# Patient Record
Sex: Female | Born: 1950 | Race: White | Hispanic: No | Marital: Married | Smoking: Never smoker
Health system: Southern US, Community
[De-identification: ages and names within clinical notes are randomized; demographics above are authoritative.]

## PROBLEM LIST (undated history)

## (undated) DIAGNOSIS — Z87442 Personal history of urinary calculi: Secondary | ICD-10-CM

## (undated) DIAGNOSIS — N10 Acute tubulo-interstitial nephritis: Secondary | ICD-10-CM

## (undated) DIAGNOSIS — R0600 Dyspnea, unspecified: Secondary | ICD-10-CM

## (undated) DIAGNOSIS — K5792 Diverticulitis of intestine, part unspecified, without perforation or abscess without bleeding: Secondary | ICD-10-CM

## (undated) DIAGNOSIS — K589 Irritable bowel syndrome without diarrhea: Secondary | ICD-10-CM

## (undated) DIAGNOSIS — E049 Nontoxic goiter, unspecified: Secondary | ICD-10-CM

## (undated) DIAGNOSIS — K219 Gastro-esophageal reflux disease without esophagitis: Secondary | ICD-10-CM

## (undated) DIAGNOSIS — R51 Headache: Secondary | ICD-10-CM

## (undated) DIAGNOSIS — K1379 Other lesions of oral mucosa: Secondary | ICD-10-CM

## (undated) DIAGNOSIS — R011 Cardiac murmur, unspecified: Secondary | ICD-10-CM

## (undated) DIAGNOSIS — Z9289 Personal history of other medical treatment: Secondary | ICD-10-CM

## (undated) DIAGNOSIS — N200 Calculus of kidney: Secondary | ICD-10-CM

## (undated) DIAGNOSIS — R7303 Prediabetes: Secondary | ICD-10-CM

## (undated) HISTORY — DX: Irritable bowel syndrome, unspecified: K58.9

## (undated) HISTORY — DX: Nontoxic goiter, unspecified: E04.9

## (undated) HISTORY — DX: Acute pyelonephritis: N10

## (undated) HISTORY — DX: Dyspnea, unspecified: R06.00

## (undated) HISTORY — DX: Calculus of kidney: N20.0

## (undated) HISTORY — DX: Gastro-esophageal reflux disease without esophagitis: K21.9

## (undated) HISTORY — DX: Headache: R51

## (undated) HISTORY — DX: Cardiac murmur, unspecified: R01.1

## (undated) HISTORY — DX: Personal history of urinary calculi: Z87.442

## (undated) HISTORY — DX: Other lesions of oral mucosa: K13.79

## (undated) HISTORY — PX: OTHER SURGICAL HISTORY: SHX169

## (undated) HISTORY — DX: Diverticulitis of intestine, part unspecified, without perforation or abscess without bleeding: K57.92

## (undated) HISTORY — DX: Personal history of other medical treatment: Z92.89

## (undated) HISTORY — DX: Prediabetes: R73.03

---
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
## 1977-01-26 HISTORY — PX: TONSILLECTOMY: SUR1361

## 1992-01-27 HISTORY — PX: ABDOMINAL HYSTERECTOMY: SHX81

## 2001-01-02 ENCOUNTER — Encounter: Payer: Self-pay | Admitting: Orthopedic Surgery

## 2001-01-02 ENCOUNTER — Emergency Department (HOSPITAL_COMMUNITY): Admission: EM | Admit: 2001-01-02 | Discharge: 2001-01-02 | Payer: Self-pay | Admitting: Orthopedic Surgery

## 2001-12-14 ENCOUNTER — Encounter: Payer: Self-pay | Admitting: Emergency Medicine

## 2001-12-14 ENCOUNTER — Emergency Department (HOSPITAL_COMMUNITY): Admission: EM | Admit: 2001-12-14 | Discharge: 2001-12-14 | Payer: Self-pay | Admitting: Emergency Medicine

## 2003-01-27 HISTORY — PX: TOTAL THYROIDECTOMY: SHX2547

## 2004-05-06 ENCOUNTER — Ambulatory Visit: Payer: Self-pay | Admitting: Internal Medicine

## 2004-09-08 ENCOUNTER — Ambulatory Visit: Payer: Self-pay | Admitting: Internal Medicine

## 2004-09-15 ENCOUNTER — Encounter: Admission: RE | Admit: 2004-09-15 | Discharge: 2004-09-15 | Payer: Self-pay | Admitting: Internal Medicine

## 2004-09-22 ENCOUNTER — Encounter (INDEPENDENT_AMBULATORY_CARE_PROVIDER_SITE_OTHER): Payer: Self-pay | Admitting: *Deleted

## 2004-09-22 ENCOUNTER — Ambulatory Visit (HOSPITAL_COMMUNITY): Admission: RE | Admit: 2004-09-22 | Discharge: 2004-09-22 | Payer: Self-pay | Admitting: Internal Medicine

## 2004-09-26 ENCOUNTER — Ambulatory Visit: Payer: Self-pay | Admitting: Internal Medicine

## 2004-10-24 ENCOUNTER — Encounter (HOSPITAL_COMMUNITY): Admission: RE | Admit: 2004-10-24 | Discharge: 2005-01-22 | Payer: Self-pay | Admitting: Otolaryngology

## 2005-01-30 ENCOUNTER — Ambulatory Visit: Payer: Self-pay | Admitting: Internal Medicine

## 2005-02-16 ENCOUNTER — Ambulatory Visit: Payer: Self-pay | Admitting: Internal Medicine

## 2005-04-10 ENCOUNTER — Ambulatory Visit: Payer: Self-pay | Admitting: Internal Medicine

## 2005-04-20 ENCOUNTER — Ambulatory Visit: Payer: Self-pay | Admitting: Internal Medicine

## 2005-05-04 ENCOUNTER — Ambulatory Visit: Payer: Self-pay

## 2005-05-04 ENCOUNTER — Encounter: Payer: Self-pay | Admitting: Internal Medicine

## 2005-05-11 ENCOUNTER — Ambulatory Visit: Payer: Self-pay | Admitting: Internal Medicine

## 2005-05-19 ENCOUNTER — Ambulatory Visit: Payer: Self-pay | Admitting: Cardiovascular Disease

## 2005-09-23 ENCOUNTER — Ambulatory Visit: Payer: Self-pay | Admitting: Internal Medicine

## 2006-03-19 ENCOUNTER — Inpatient Hospital Stay (HOSPITAL_COMMUNITY): Admission: EM | Admit: 2006-03-19 | Discharge: 2006-03-25 | Payer: Self-pay | Admitting: Emergency Medicine

## 2006-04-05 ENCOUNTER — Encounter: Payer: Self-pay | Admitting: Internal Medicine

## 2006-04-21 ENCOUNTER — Encounter: Admission: RE | Admit: 2006-04-21 | Discharge: 2006-04-21 | Payer: Self-pay | Admitting: Orthopedic Surgery

## 2006-05-20 ENCOUNTER — Encounter: Admission: RE | Admit: 2006-05-20 | Discharge: 2006-05-20 | Payer: Self-pay | Admitting: Orthopedic Surgery

## 2006-06-01 ENCOUNTER — Ambulatory Visit (HOSPITAL_COMMUNITY): Admission: RE | Admit: 2006-06-01 | Discharge: 2006-06-01 | Payer: Self-pay | Admitting: Orthopedic Surgery

## 2006-06-14 ENCOUNTER — Encounter: Payer: Self-pay | Admitting: Internal Medicine

## 2006-07-01 ENCOUNTER — Encounter: Payer: Self-pay | Admitting: Internal Medicine

## 2006-07-05 ENCOUNTER — Ambulatory Visit (HOSPITAL_COMMUNITY): Admission: RE | Admit: 2006-07-05 | Discharge: 2006-07-05 | Payer: Self-pay | Admitting: Orthopedic Surgery

## 2006-08-17 ENCOUNTER — Encounter: Payer: Self-pay | Admitting: Internal Medicine

## 2006-09-15 ENCOUNTER — Encounter: Payer: Self-pay | Admitting: Internal Medicine

## 2006-09-24 ENCOUNTER — Ambulatory Visit: Payer: Self-pay | Admitting: Internal Medicine

## 2006-09-24 LAB — CONVERTED CEMR LAB
ALT: 42 units/L — ABNORMAL HIGH (ref 0–35)
AST: 28 units/L (ref 0–37)
Alkaline Phosphatase: 64 units/L (ref 39–117)
BUN: 15 mg/dL (ref 6–23)
Basophils Absolute: 0 10*3/uL (ref 0.0–0.1)
Bilirubin Urine: NEGATIVE
CO2: 28 meq/L (ref 19–32)
Chloride: 106 meq/L (ref 96–112)
Creatinine, Ser: 0.7 mg/dL (ref 0.4–1.2)
Eosinophils Absolute: 0.1 10*3/uL (ref 0.0–0.6)
GFR calc Af Amer: 111 mL/min
GFR calc non Af Amer: 92 mL/min
HCT: 37.8 % (ref 36.0–46.0)
HDL: 40 mg/dL (ref >39.0)
MCV: 93.3 fL (ref 78.0–100.0)
Monocytes Absolute: 0.4 10*3/uL (ref 0.2–0.7)
Monocytes Relative: 8.1 % (ref 3.0–11.0)
Neutrophils Relative %: 55.4 % (ref 43.0–77.0)
Nitrite: NEGATIVE
Potassium: 3.9 meq/L (ref 3.5–5.1)
Protein, U semiquant: NEGATIVE
RDW: 13.5 % (ref 11.5–14.6)
Specific Gravity, Urine: 1.02
TSH: 0.16 microintl units/mL — ABNORMAL LOW (ref 0.35–5.50)
Total CHOL/HDL Ratio: 6.3
Total Protein: 6.4 g/dL (ref 6.0–8.3)
VLDL: 34 mg/dL (ref 0–40)
WBC Urine, dipstick: NEGATIVE

## 2006-09-30 DIAGNOSIS — K219 Gastro-esophageal reflux disease without esophagitis: Secondary | ICD-10-CM | POA: Insufficient documentation

## 2006-09-30 DIAGNOSIS — J45909 Unspecified asthma, uncomplicated: Secondary | ICD-10-CM | POA: Insufficient documentation

## 2006-10-04 ENCOUNTER — Ambulatory Visit: Payer: Self-pay | Admitting: Internal Medicine

## 2006-10-04 DIAGNOSIS — M899 Disorder of bone, unspecified: Secondary | ICD-10-CM | POA: Insufficient documentation

## 2006-10-04 DIAGNOSIS — R7401 Elevation of levels of liver transaminase levels: Secondary | ICD-10-CM | POA: Insufficient documentation

## 2006-10-04 DIAGNOSIS — M949 Disorder of cartilage, unspecified: Secondary | ICD-10-CM

## 2006-10-04 DIAGNOSIS — R635 Abnormal weight gain: Secondary | ICD-10-CM | POA: Insufficient documentation

## 2006-10-04 DIAGNOSIS — E039 Hypothyroidism, unspecified: Secondary | ICD-10-CM | POA: Insufficient documentation

## 2006-10-04 DIAGNOSIS — G2581 Restless legs syndrome: Secondary | ICD-10-CM | POA: Insufficient documentation

## 2006-10-04 DIAGNOSIS — E785 Hyperlipidemia, unspecified: Secondary | ICD-10-CM | POA: Insufficient documentation

## 2006-10-04 DIAGNOSIS — R740 Nonspecific elevation of levels of transaminase and lactic acid dehydrogenase [LDH]: Secondary | ICD-10-CM

## 2006-10-12 ENCOUNTER — Encounter: Payer: Self-pay | Admitting: Internal Medicine

## 2006-10-15 ENCOUNTER — Encounter: Admission: RE | Admit: 2006-10-15 | Discharge: 2006-10-15 | Payer: Self-pay | Admitting: Orthopedic Surgery

## 2006-10-20 ENCOUNTER — Telehealth: Payer: Self-pay | Admitting: *Deleted

## 2006-11-10 ENCOUNTER — Encounter: Payer: Self-pay | Admitting: Internal Medicine

## 2006-11-17 ENCOUNTER — Ambulatory Visit: Payer: Self-pay | Admitting: Internal Medicine

## 2006-11-17 LAB — CONVERTED CEMR LAB
Albumin: 4 g/dL (ref 3.5–5.2)
Cholesterol: 268 mg/dL (ref 0–200)
Direct LDL: 209.4 mg/dL
Triglycerides: 117 mg/dL (ref 0–149)

## 2006-11-19 ENCOUNTER — Telehealth: Payer: Self-pay | Admitting: *Deleted

## 2006-11-19 LAB — CONVERTED CEMR LAB
Calcium, Total (PTH): 8.8 mg/dL (ref 8.4–10.5)
Vit D, 1,25-Dihydroxy: 26 — ABNORMAL LOW (ref 30–89)

## 2006-11-25 ENCOUNTER — Ambulatory Visit: Payer: Self-pay | Admitting: Internal Medicine

## 2006-11-25 DIAGNOSIS — E559 Vitamin D deficiency, unspecified: Secondary | ICD-10-CM | POA: Insufficient documentation

## 2006-11-25 LAB — CONVERTED CEMR LAB: HDL goal, serum: 40 mg/dL

## 2007-01-17 ENCOUNTER — Ambulatory Visit: Payer: Self-pay | Admitting: Internal Medicine

## 2007-01-17 LAB — CONVERTED CEMR LAB: Vit D, 1,25-Dihydroxy: 62 (ref 30–89)

## 2007-01-20 LAB — CONVERTED CEMR LAB
ALT: 32 units/L (ref 0–35)
Albumin: 3.9 g/dL (ref 3.5–5.2)
Cholesterol: 161 mg/dL (ref 0–200)
Total CHOL/HDL Ratio: 3.7
Total Protein: 6.4 g/dL (ref 6.0–8.3)
Triglycerides: 113 mg/dL (ref 0–149)
VLDL: 23 mg/dL (ref 0–40)

## 2007-01-25 ENCOUNTER — Ambulatory Visit: Payer: Self-pay | Admitting: Internal Medicine

## 2007-06-27 LAB — HM COLONOSCOPY

## 2007-06-30 ENCOUNTER — Telehealth: Payer: Self-pay | Admitting: Internal Medicine

## 2007-07-01 ENCOUNTER — Ambulatory Visit: Payer: Self-pay | Admitting: Family Medicine

## 2007-08-01 ENCOUNTER — Encounter: Payer: Self-pay | Admitting: Internal Medicine

## 2007-08-26 ENCOUNTER — Encounter: Payer: Self-pay | Admitting: Internal Medicine

## 2007-08-29 ENCOUNTER — Encounter: Payer: Self-pay | Admitting: Internal Medicine

## 2007-11-03 ENCOUNTER — Telehealth: Payer: Self-pay | Admitting: Internal Medicine

## 2007-11-04 ENCOUNTER — Ambulatory Visit: Payer: Self-pay | Admitting: Internal Medicine

## 2007-11-04 LAB — CONVERTED CEMR LAB
Blood in Urine, dipstick: NEGATIVE
Ketones, urine, test strip: NEGATIVE
WBC Urine, dipstick: NEGATIVE
pH: 5

## 2007-11-05 ENCOUNTER — Encounter: Payer: Self-pay | Admitting: Internal Medicine

## 2007-11-06 DIAGNOSIS — Z87442 Personal history of urinary calculi: Secondary | ICD-10-CM | POA: Insufficient documentation

## 2007-12-01 ENCOUNTER — Ambulatory Visit: Payer: Self-pay | Admitting: Internal Medicine

## 2007-12-01 LAB — CONVERTED CEMR LAB
AST: 31 units/L (ref 0–37)
Albumin: 3.9 g/dL (ref 3.5–5.2)
BUN: 13 mg/dL (ref 6–23)
Basophils Absolute: 0 10*3/uL (ref 0.0–0.1)
Bilirubin, Direct: 0.2 mg/dL (ref 0.0–0.3)
Blood in Urine, dipstick: NEGATIVE
CO2: 30 meq/L (ref 19–32)
Chloride: 106 meq/L (ref 96–112)
Cholesterol: 147 mg/dL (ref 0–200)
Eosinophils Relative: 1.6 % (ref 0.0–5.0)
GFR calc non Af Amer: 92 mL/min
Glucose, Bld: 109 mg/dL — ABNORMAL HIGH (ref 70–99)
Glucose, Urine, Semiquant: NEGATIVE
Ketones, urine, test strip: NEGATIVE
Lymphocytes Relative: 31 % (ref 12.0–46.0)
Monocytes Absolute: 0.3 10*3/uL (ref 0.1–1.0)
Nitrite: NEGATIVE
Potassium: 4.3 meq/L (ref 3.5–5.1)
Protein, U semiquant: NEGATIVE
Sodium: 141 meq/L (ref 135–145)
Specific Gravity, Urine: 1.02
TSH: 0.13 microintl units/mL — ABNORMAL LOW (ref 0.35–5.50)
Total Protein: 6.3 g/dL (ref 6.0–8.3)
Triglycerides: 100 mg/dL (ref 0–149)
Urobilinogen, UA: 0.2
WBC Urine, dipstick: NEGATIVE
WBC: 4.3 10*3/uL — ABNORMAL LOW (ref 4.5–10.5)
pH: 5.5

## 2007-12-14 ENCOUNTER — Ambulatory Visit: Payer: Self-pay | Admitting: Internal Medicine

## 2007-12-14 DIAGNOSIS — R7309 Other abnormal glucose: Secondary | ICD-10-CM | POA: Insufficient documentation

## 2007-12-14 DIAGNOSIS — R945 Abnormal results of liver function studies: Secondary | ICD-10-CM | POA: Insufficient documentation

## 2007-12-14 LAB — CONVERTED CEMR LAB
Bilirubin Urine: NEGATIVE
Nitrite: NEGATIVE
Protein, U semiquant: NEGATIVE

## 2007-12-15 ENCOUNTER — Encounter: Payer: Self-pay | Admitting: Internal Medicine

## 2007-12-21 ENCOUNTER — Encounter: Admission: RE | Admit: 2007-12-21 | Discharge: 2007-12-21 | Payer: Self-pay | Admitting: Internal Medicine

## 2007-12-26 ENCOUNTER — Telehealth (INDEPENDENT_AMBULATORY_CARE_PROVIDER_SITE_OTHER): Payer: Self-pay | Admitting: *Deleted

## 2007-12-26 DIAGNOSIS — R935 Abnormal findings on diagnostic imaging of other abdominal regions, including retroperitoneum: Secondary | ICD-10-CM | POA: Insufficient documentation

## 2007-12-28 ENCOUNTER — Ambulatory Visit: Payer: Self-pay | Admitting: Internal Medicine

## 2007-12-28 ENCOUNTER — Ambulatory Visit: Payer: Self-pay

## 2007-12-29 LAB — CONVERTED CEMR LAB
Albumin: 3.7 g/dL (ref 3.5–5.2)
Alkaline Phosphatase: 66 units/L (ref 39–117)
Bilirubin, Direct: 0.1 mg/dL (ref 0.0–0.3)
Free T4: 0.9 ng/dL (ref 0.6–1.6)
TSH: 0.15 microintl units/mL — ABNORMAL LOW (ref 0.35–5.50)

## 2008-01-12 ENCOUNTER — Ambulatory Visit: Payer: Self-pay | Admitting: Internal Medicine

## 2008-01-12 DIAGNOSIS — E669 Obesity, unspecified: Secondary | ICD-10-CM | POA: Insufficient documentation

## 2008-01-12 DIAGNOSIS — R0602 Shortness of breath: Secondary | ICD-10-CM | POA: Insufficient documentation

## 2008-01-13 ENCOUNTER — Ambulatory Visit: Payer: Self-pay | Admitting: Internal Medicine

## 2008-01-16 ENCOUNTER — Telehealth: Payer: Self-pay | Admitting: *Deleted

## 2008-01-27 HISTORY — PX: TOTAL KNEE REVISION: SHX996

## 2008-01-30 ENCOUNTER — Ambulatory Visit: Payer: Self-pay | Admitting: Cardiovascular Disease

## 2008-02-08 ENCOUNTER — Encounter: Payer: Self-pay | Admitting: Internal Medicine

## 2008-02-08 ENCOUNTER — Ambulatory Visit: Payer: Self-pay | Admitting: Internal Medicine

## 2008-02-14 ENCOUNTER — Telehealth: Payer: Self-pay | Admitting: Internal Medicine

## 2008-02-21 ENCOUNTER — Ambulatory Visit: Payer: Self-pay

## 2008-02-21 ENCOUNTER — Encounter: Payer: Self-pay | Admitting: Cardiovascular Disease

## 2008-02-29 ENCOUNTER — Encounter: Admission: RE | Admit: 2008-02-29 | Discharge: 2008-02-29 | Payer: Self-pay | Admitting: Internal Medicine

## 2008-02-29 ENCOUNTER — Encounter: Payer: Self-pay | Admitting: Internal Medicine

## 2008-06-26 HISTORY — PX: OTHER SURGICAL HISTORY: SHX169

## 2008-07-06 ENCOUNTER — Encounter: Payer: Self-pay | Admitting: Cardiovascular Disease

## 2008-09-26 ENCOUNTER — Ambulatory Visit: Payer: Self-pay | Admitting: Internal Medicine

## 2008-09-26 DIAGNOSIS — T50995A Adverse effect of other drugs, medicaments and biological substances, initial encounter: Secondary | ICD-10-CM | POA: Insufficient documentation

## 2008-10-24 ENCOUNTER — Telehealth: Payer: Self-pay | Admitting: *Deleted

## 2008-11-13 ENCOUNTER — Ambulatory Visit: Payer: Self-pay | Admitting: Internal Medicine

## 2008-11-13 LAB — CONVERTED CEMR LAB
Bilirubin Urine: NEGATIVE
Blood in Urine, dipstick: NEGATIVE
Calcium: 9 mg/dL (ref 8.4–10.5)
Chloride: 106 meq/L (ref 96–112)
Eosinophils Absolute: 0.1 10*3/uL (ref 0.0–0.7)
Eosinophils Relative: 1.6 % (ref 0.0–5.0)
GFR calc non Af Amer: 78.21 mL/min (ref >60)
Glucose, Bld: 108 mg/dL — ABNORMAL HIGH (ref 70–99)
HCT: 42.2 % (ref 36.0–46.0)
HDL: 43.6 mg/dL (ref >39.00)
LDL Cholesterol: 77 mg/dL (ref 0–99)
MCHC: 33.2 g/dL (ref 30.0–36.0)
Monocytes Relative: 7.9 % (ref 3.0–12.0)
Neutro Abs: 2.5 10*3/uL (ref 1.4–7.7)
Neutrophils Relative %: 55.2 % (ref 43.0–77.0)
Nitrite: NEGATIVE
Platelets: 225 10*3/uL (ref 150.0–400.0)
Potassium: 4.2 meq/L (ref 3.5–5.1)
Protein, U semiquant: NEGATIVE
RBC: 4.38 M/uL (ref 3.87–5.11)
Sodium: 144 meq/L (ref 135–145)
Specific Gravity, Urine: 1.025
TSH: 0.25 microintl units/mL — ABNORMAL LOW (ref 0.35–5.50)
Total CHOL/HDL Ratio: 3
Total Protein: 6.6 g/dL (ref 6.0–8.3)
Triglycerides: 114 mg/dL (ref 0.0–149.0)
WBC Urine, dipstick: NEGATIVE

## 2008-11-21 ENCOUNTER — Ambulatory Visit: Payer: Self-pay | Admitting: Internal Medicine

## 2008-11-22 ENCOUNTER — Encounter: Payer: Self-pay | Admitting: *Deleted

## 2008-11-22 ENCOUNTER — Encounter (INDEPENDENT_AMBULATORY_CARE_PROVIDER_SITE_OTHER): Payer: Self-pay | Admitting: *Deleted

## 2008-11-26 ENCOUNTER — Telehealth: Payer: Self-pay | Admitting: *Deleted

## 2008-11-28 ENCOUNTER — Encounter: Payer: Self-pay | Admitting: *Deleted

## 2008-11-28 ENCOUNTER — Ambulatory Visit: Payer: Self-pay | Admitting: Cardiology

## 2008-12-03 ENCOUNTER — Encounter (INDEPENDENT_AMBULATORY_CARE_PROVIDER_SITE_OTHER): Payer: Self-pay | Admitting: *Deleted

## 2008-12-06 ENCOUNTER — Encounter (INDEPENDENT_AMBULATORY_CARE_PROVIDER_SITE_OTHER): Payer: Self-pay | Admitting: *Deleted

## 2008-12-18 ENCOUNTER — Encounter (INDEPENDENT_AMBULATORY_CARE_PROVIDER_SITE_OTHER): Payer: Self-pay | Admitting: *Deleted

## 2009-01-01 ENCOUNTER — Encounter (INDEPENDENT_AMBULATORY_CARE_PROVIDER_SITE_OTHER): Payer: Self-pay | Admitting: *Deleted

## 2009-01-01 LAB — CBC AND DIFFERENTIAL
HCT: 39 (ref 36–46)
Hemoglobin: 13.5 (ref 12.0–16.0)
WBC: 4.7

## 2009-01-29 ENCOUNTER — Ambulatory Visit: Payer: Self-pay | Admitting: Cardiovascular Disease

## 2009-01-29 DIAGNOSIS — E7800 Pure hypercholesterolemia, unspecified: Secondary | ICD-10-CM | POA: Insufficient documentation

## 2009-01-30 LAB — CONVERTED CEMR LAB
Direct LDL: 194.6 mg/dL
Triglycerides: 153 mg/dL — ABNORMAL HIGH (ref 0.0–149.0)
VLDL: 30.6 mg/dL (ref 0.0–40.0)

## 2009-03-12 ENCOUNTER — Encounter: Payer: Self-pay | Admitting: Internal Medicine

## 2009-03-29 ENCOUNTER — Encounter: Payer: Self-pay | Admitting: Internal Medicine

## 2009-06-13 ENCOUNTER — Encounter (INDEPENDENT_AMBULATORY_CARE_PROVIDER_SITE_OTHER): Payer: Self-pay | Admitting: *Deleted

## 2009-10-02 ENCOUNTER — Telehealth: Payer: Self-pay | Admitting: *Deleted

## 2009-10-04 ENCOUNTER — Encounter: Payer: Self-pay | Admitting: Internal Medicine

## 2009-10-31 ENCOUNTER — Ambulatory Visit: Payer: Self-pay | Admitting: Cardiovascular Disease

## 2009-11-20 ENCOUNTER — Ambulatory Visit: Payer: Self-pay | Admitting: Internal Medicine

## 2009-11-20 LAB — CONVERTED CEMR LAB
Alkaline Phosphatase: 53 units/L (ref 39–117)
BUN: 17 mg/dL (ref 6–23)
Blood in Urine, dipstick: NEGATIVE
Calcium: 9 mg/dL (ref 8.4–10.5)
Chloride: 101 meq/L (ref 96–112)
Creatinine, Ser: 0.8 mg/dL (ref 0.4–1.2)
Direct LDL: 162.9 mg/dL
Eosinophils Absolute: 0.1 10*3/uL (ref 0.0–0.7)
HDL: 52.9 mg/dL (ref >39.00)
Ketones, urine, test strip: NEGATIVE
MCV: 96.1 fL (ref 78.0–100.0)
Monocytes Relative: 8 % (ref 3.0–12.0)
Neutrophils Relative %: 57.3 % (ref 43.0–77.0)
Nitrite: NEGATIVE
Protein, U semiquant: NEGATIVE
Sodium: 137 meq/L (ref 135–145)
Specific Gravity, Urine: 1.025
Total CHOL/HDL Ratio: 5
Triglycerides: 167 mg/dL — ABNORMAL HIGH (ref 0.0–149.0)
Urobilinogen, UA: 0.2
VLDL: 33.4 mg/dL (ref 0.0–40.0)
WBC Urine, dipstick: NEGATIVE
WBC: 4.2 10*3/uL — ABNORMAL LOW (ref 4.5–10.5)

## 2009-11-27 ENCOUNTER — Ambulatory Visit: Payer: Self-pay | Admitting: Internal Medicine

## 2009-11-27 DIAGNOSIS — H269 Unspecified cataract: Secondary | ICD-10-CM | POA: Insufficient documentation

## 2010-02-16 ENCOUNTER — Encounter: Payer: Self-pay | Admitting: Otolaryngology

## 2010-02-25 NOTE — Letter (Signed)
Summary: Appointment - Reminder 2  [facility], Main Office  [address]   [place], [place] [address]   Phone: [phone]  Fax: [phone]     Jun 13, 2009 MRN: [id number]   [name] [address] [place], [place]  [address]   Dear Ms. Landa,  Our records indicate that it is time to schedule a follow-up appointment with Dr. Eden Emms. It is very important that we reach you to schedule this appointment. We look forward to participating in your health care needs. Please contact us at the number listed above at your earliest convenience to schedule your appointment.  If you are unable to make an appointment at this time, give [place] a call so we can update our records.     Sincerely,  Migdalia Dk [facility] Scheduling Team

## 2010-02-25 NOTE — Progress Notes (Signed)
Summary: Pt  is req a pre-med in order to have her teeth cleaned  Phone Note Call from Patient Call back at [facility] Phone [phone]   Caller: Patient Summary of Call: Pt called and sch their yearly cpx with Dr. Fabian Sharp. 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 Fabian Sharp prescribe a pre-med in order to have her teeth cleaned? If pt needs to contact her orthopedic dr for this, pls let pt know. Otherwise, pls [facility]   Initial call taken by: Lucy Antigua,  October 02, 2009 9:11 AM  Follow-up for Phone Call        have her  ortho  decide and write rx if indicated  Follow-up by: Madelin Headings MD,  October 02, 2009 9:35 AM  Additional Follow-up for Phone Call Additional follow up Details #1::        Pt aware and will call ortho. Additional Follow-up by: Romualdo Bolk, CMA (AAMA),  October 02, 2009 1:22 PM

## 2010-02-25 NOTE — Assessment & Plan Note (Signed)
Summary: 1 year fu appt past due/mt  Medications Added CRESTOR 5 MG TABS (ROSUVASTATIN CALCIUM) every other day AMOXICILLIN 500 MG CAPS (AMOXICILLIN) as needed dental      Allergies Added: NKDA  CC:  sob.....uncomfortable feeling in chest about a couple weeks ago pt is ok today..pt states he has stopped her crestor for about 1 week due to leg cramping .  History of Present Illness: [name] is seen today in F/U for SOB, SSCP, HTN and elevated cholesterol.  She had a normal stress echo last year.  She just had repeat left knee surgery at [facility] and recovery has been slow due to scar tissue.   She has been compliant with her meds except for crestor. She is gettying myalgias again as she did with lipitor and has not taken any in 2 weeks.  UnRx her LDL is awful at 195.  She will try to take it qod and see how her lab work looks in 6-8 weeks.  BP is under good control.  She denies current SOB.  She is interested in vascular screeing and I told her we offer this package at [facility] for about $100.  Current Problems (verified): 1)  Pure Hypercholesterolemia  (ICD-272.0) 2)  Preoperative Examination  (ICD-V72.84) 3)  Knee Pain, Left  (ICD-719.46) 4)  Unspecified Pre-operative Examination  (ICD-V72.84) 5)  Adverse Reaction To Medication  (ICD-995.29) 6)  Obesity  (ICD-278.00) 7)  Chest Pain, Atypical  (ICD-786.59) 8)  Dyspnea  (ICD-786.05) 9)  Abdominal Ultrasound, Abnormal  (ICD-793.6) 10)  Leg Pain, Left Hx Trauma Fx  (ICD-729.5) 11)  Urinary Urgency  (WUX-324.40) 12)  Liver Function Tests, Abnormal  (ICD-794.8) 13)  Hyperglycemia  (ICD-790.29) 14)  Nephrolithiasis, Hx of  (ICD-V13.01) 15)  Back Pain, Acute  (ICD-724.5) 16)  Acute Sinusitis, Unspecified  (ICD-461.9) 17)  Vitamin D Deficiency  (ICD-268.9) 18)  Weight Gain  (ICD-783.1) 19)  Hypothyroidism  (ICD-244.9) 20)  Syndrome, Restless Legs  (ICD-333.94) 21)  Disorder, Bone/cartilage Nos  (ICD-733.90) 22)  Hyperlipidemia Nec/nos   (ICD-272.4) 23)  Neoplasm, Skin, Uncertain Behavior  (ICD-238.2) 24)  Elevation, Transaminase/ldh Levels  (ICD-790.4) 25)  Preventive Health Care  (ICD-V70.0) 26)  Family History Diabetes 1st Degree Relative  (ICD-V18.0) 27)  Family History Breast Cancer 1st Degree Relative <50  (ICD-V16.3) 28)  Headache  (ICD-784.0) 29)  Asthma  (ICD-493.90) 30)  Gerd  (ICD-530.81)  Current Medications (verified): 1)  Requip 0.5 Mg  Tabs (Ropinirole Hcl) .... Take 4 Per Day As Directed 2)  Synthroid 50 Mcg Tabs (Levothyroxine Sodium) .Marland Kitchen.. 1 By Mouth Once Daily 3)  Crestor 5 Mg Tabs (Rosuvastatin Calcium) .... Every Other Day 4)  Amoxicillin 500 Mg Caps (Amoxicillin) .... As Needed Dental  Allergies (verified): No Known Drug Allergies  Past History:  Past Medical History: Last updated: 11/21/2008 IBS Thyroid Goiter I 131 rx and then [facility]roidectomy 2005 [facility] GERD Dyspnea : nl PFTs, and echo  Ulcers Heart Murmur Asthma Headache hx stones  Nephrolithiasis, hx of G4P4       LAST Mammogram: 11/08 Pap: 2008 Td: 2003 Colonscopy: 6/09   EKG: 2007 [facility]  Nishan [facility]     Past Surgical History: Last updated: 11/21/2008 Hysterectomy 1994 Appendectomy 1968 Tonsillectomy 1979 ORIF left tibial plateau fracture  in the Dr. Leslee Home 2/08 Thyroidectomy  total, [facility]  large nodule  2006 Plates and pins take out of left knee in June 2010  Family History: Last updated: 11/21/2008 Family History of Arthritis Family History Breast cancer 1st degree  relative <50 Family History Diabetes 1st degree relative Family History High cholesterol Family History Hypertension Family History of Stroke M 1st degree relative <50 Family History of Cardiovascular disorder Mom had  bladder cancer.  AAA   COPD Sister  Father:  Cad  less than age 49    Aneurysm ( AAA) Mother:  Siblings:  SIS CAD age 98  COPD   Social History: Last updated: 11/21/2008 Married Never Smoked Alcohol  use-no Drug use-no Regular exercise-yes when possible   Grandchild 7 months old  helps caretake.    Review of Systems       Denies fever, malais, weight loss, blurry vision, decreased visual acuity, cough, sputum, SOB, hemoptysis, pleuritic pain, palpitaitons, heartburn, abdominal pain, melena, lower extremity edema, claudication, or rash.   Vital Signs:  Patient profile:   60 year old female Menstrual status:  hysterectomy Height:      60 inches Weight:      157 pounds BMI:     30.77 Pulse rate:   70 / minute Resp:     12 per minute BP sitting:   118 / 78  (left arm)  Vitals Entered By: [name] (October 31, 2009 3:24 PM)  Physical Exam  General:  Affect appropriate Healthy:  appears stated age HEENT: normal Neck supple with no adenopathy JVP normal no bruits no thyromegaly Lungs clear with no wheezing and good diaphragmatic motion Heart:  S1/S2 no murmur,rub, gallop or click PMI normal Abdomen: benighn, BS positve, no tenderness, no AAA no bruit.  No HSM or HJR Distal pulses intact with no bruits No edema Neuro non-focal Skin warm and dry    Impression & Recommendations:  Problem # 1:  PURE HYPERCHOLESTEROLEMIA (ICD-272.0) Decrease crestor to every other day labs in 6-8 weeks.  May end up on combo of fibrate and niacin if cannot tolerate statins Her updated medication list for this problem includes:    Crestor 5 Mg Tabs (Rosuvastatin calcium) ..... Every other day  Problem # 2:  CHEST PAIN, ATYPICAL (ICD-786.59) Resolved with no evidence of CAD The following medications were removed from the medication list:    Aspirin 81 Mg Tabs (Aspirin) .Marland Kitchen... 1 tab by mouth once daily  Problem # 3:  DYSPNEA (ICD-786.05) Improved with history of asthma.  Normal EF and cardiac exam. Continue exercise program The following medications were removed from the medication list:    Aspirin 81 Mg Tabs (Aspirin) .Marland Kitchen... 1 tab by mouth once daily  Patient Instructions: 1)   Your physician recommends that you schedule a follow-up appointment in: 6 MONTHS WITH DR Eden Emms 2)  Your physician has recommended you make the following change in your medication: CRESTOR every other day

## 2010-02-25 NOTE — Assessment & Plan Note (Signed)
Summary: per check out/sf  Medications Added HYDROCODONE-ACETAMINOPHEN 5-500 MG TABS (HYDROCODONE-ACETAMINOPHEN) as needed * LAXATIVE as needed ASPIRIN 81 MG  TABS (ASPIRIN) 1 tab by mouth once daily      Allergies Added: NKDA  History of Present Illness: [place] is seen today in F/U for SOB, SSCP, HTN and elevated cholesterol.  She had a normal stress echo last year.  She just had repeat left knee surgery at [facility] and recovery has been slow due to scar tissue.  She may need more surgery and is going to PT/OT 3x/week.  There were no cardiac issues with surgery.  She has been compliant with her meds and BP is under good control.  She denies current SOB.  She had significant LE leg pain and restless legs that seem to be much improved off Lipitor.  Since this was started fro primary prevention we will check her fasting labs today and see if her LDL is greater than 130.  otherwise her cardiac status appears stable.  I also take care of her daughter Ames Coupe who as had a pulmonary homograft at [facility]  Current Problems (verified): 1)  Preoperative Examination  (ICD-V72.84) 2)  Knee Pain, Left  (ICD-719.46) 3)  Unspecified Pre-operative Examination  (ICD-V72.84) 4)  Adverse Reaction To Medication  (ICD-995.29) 5)  Obesity  (ICD-278.00) 6)  Chest Pain, Atypical  (ICD-786.59) 7)  Dyspnea  (ICD-786.05) 8)  Abdominal Ultrasound, Abnormal  (ICD-793.6) 9)  Leg Pain, Left Hx Trauma Fx  (ICD-729.5) 10)  Urinary Urgency  (ICD-788.63) 11)  Liver Function Tests, Abnormal  (ICD-794.8) 12)  Hyperglycemia  (ICD-790.29) 13)  Nephrolithiasis, Hx of  (ICD-V13.01) 14)  Back Pain, Acute  (ICD-724.5) 15)  Acute Sinusitis, Unspecified  (ICD-461.9) 16)  Vitamin D Deficiency  (ICD-268.9) 17)  Weight Gain  (ICD-783.1) 18)  Hypothyroidism  (ICD-244.9) 19)  Syndrome, Restless Legs  (ICD-333.94) 20)  Disorder, Bone/cartilage Nos  (ICD-733.90) 21)  Hyperlipidemia Nec/nos  (ICD-272.4) 22)  Neoplasm, Skin,  Uncertain Behavior  (ICD-238.2) 23)  Elevation, Transaminase/ldh Levels  (ICD-790.4) 24)  Preventive Health Care  (ICD-V70.0) 25)  Family History Diabetes 1st Degree Relative  (ICD-V18.0) 26)  Family History Breast Cancer 1st Degree Relative <50  (ICD-V16.3) 27)  Headache  (ICD-784.0) 28)  Asthma  (ICD-493.90) 29)  Gerd  (ICD-530.81)  Current Medications (verified): 1)  Requip 0.5 Mg  Tabs (Ropinirole Hcl) .... Take 4 Per Day As Directed 2)  Synthroid 50 Mcg Tabs (Levothyroxine Sodium) .Marland Kitchen.. 1 By Mouth Once Daily 3)  Hydrocodone-Acetaminophen 5-500 Mg Tabs (Hydrocodone-Acetaminophen) .... As Needed 4)  Laxative .... As Needed 5)  Aspirin 81 Mg  Tabs (Aspirin) .Marland Kitchen.. 1 Tab By Mouth Once Daily  Allergies (verified): No Known Drug Allergies  Past History:  Past Medical History: Last updated: 11/21/2008 IBS Thyroid Goiter I 131 rx and then [facility]roidectomy 2005 [facility] GERD Dyspnea : nl PFTs, and echo  Ulcers Heart Murmur Asthma Headache hx stones  Nephrolithiasis, hx of G4P4       LAST Mammogram: 11/08 Pap: 2008 Td: 2003 Colonscopy: 6/09   EKG: 2007 [facility]  Maurilio Puryear [facility]     Past Surgical History: Last updated: 11/21/2008 Hysterectomy 1994 Appendectomy 1968 Tonsillectomy 1979 ORIF left tibial plateau fracture  in the Dr. Leslee Home 2/08 Thyroidectomy  total, [facility]  large nodule  2006 Plates and pins take out of left knee in June 2010  Family History: Last updated: 11/21/2008 Family History of Arthritis Family History Breast cancer 1st degree relative <50 Family History Diabetes 1st degree  relative Family History High cholesterol Family History Hypertension Family History of Stroke M 1st degree relative <50 Family History of Cardiovascular disorder Mom had  bladder cancer.  AAA   COPD Sister  Father:  Cad  less than age 47    Aneurysm ( AAA) Mother:  Siblings:  SIS CAD age 85  COPD   Social History: Last updated: 11/21/2008 Married Never  Smoked Alcohol use-no Drug use-no Regular exercise-yes when possible   Grandchild 7 months old  helps caretake.    Review of Systems       Denies fever, malais, weight loss, blurry vision, decreased visual acuity, cough, sputum, SOB, hemoptysis, pleuritic pain, palpitaitons, heartburn, abdominal pain, melena, lower extremity edema, claudication, or rash.   Vital Signs:  Patient profile:   60 year old female Menstrual status:  hysterectomy Height:      60 inches Weight:      238 pounds BMI:     46.65 Pulse rate:   80 / minute Resp:     12 per minute BP sitting:   120 / 80  (left arm)  Vitals Entered By: Ledon Snare, RN (January 29, 2009 9:24 AM)  Physical Exam  General:  Affect appropriate Healthy:  appears stated age HEENT: normal Neck supple with no adenopathy JVP normal no bruits no thyromegaly Lungs clear with no wheezing and good diaphragmatic motion Heart:  S1/S2 no murmur,rub, gallop or click PMI normal Abdomen: benighn, BS positve, no tenderness, no AAA no bruit.  No HSM or HJR Distal pulses intact with no bruits No edema Neuro non-focal Skin warm and dry Scar over left knee with mild swelling   Impression & Recommendations:  Problem # 1:  PURE HYPERCHOLESTEROLEMIA (ICD-272.0) Fasting lipids today.  Lipitor has been stopped due to leg pain The following medications were removed from the medication list:    Lipitor 20 Mg Tabs (Atorvastatin calcium) .Marland Kitchen... 1 by mouth once daily  Problem # 2:  CHEST PAIN, ATYPICAL (ICD-786.59) Normal stress echo last year no evidence of CAD.  No complications with recent knee surgery Her updated medication list for this problem includes:    Aspirin 81 Mg Tabs (Aspirin) .Marland Kitchen... 1 tab by mouth once daily  Problem # 3:  DYSPNEA (ICD-786.05) Funcitonal no evidence of cardiopulmonary disease.  Normal EF by echo Her updated medication list for this problem includes:    Aspirin 81 Mg Tabs (Aspirin) .Marland Kitchen... 1 tab by mouth once  daily

## 2010-02-25 NOTE — Letter (Signed)
Summary: Appointments, Instructions, Medication List/[facility], Instructions, Medication List/[facility]   Imported By: Maryln Gottron 04/10/2009 12:37:14  _____________________________________________________________________  External Attachment:    Type:   Image     Comment:   External Document

## 2010-02-25 NOTE — Letter (Signed)
Summary: [facility]  [facility]   Imported By: Maryln Gottron 03/20/2009 15:11:28  _____________________________________________________________________  External Attachment:    Type:   Image     Comment:   External Document

## 2010-02-25 NOTE — Letter (Signed)
Summary: [facility]  [facility]   Imported By: Maryln Gottron 04/04/2009 12:32:36  _____________________________________________________________________  External Attachment:    Type:   Image     Comment:   External Document

## 2010-02-25 NOTE — Assessment & Plan Note (Signed)
Summary: CPX/PAP/CJR   Vital Signs:  Patient profile:   60 year old female Menstrual status:  hysterectomy Height:      59.5 inches Weight:      161 pounds Pulse rate:   66 / minute BP sitting:   140 / 90  (right arm) Cuff size:   regular  Vitals Entered By: Romualdo Bolk, CMA (AAMA) (November 27, 2009 2:32 PM) CC: CPX    History of Present Illness: [name]  pulses intact without delay  fpr preventive visit and follow up of multiple medical problems .  left knee did well after surgery and  was able   to do  5 k .  cancer walk.  finally alblt to ecercise  Thryoid  same    still brand and no change and no missed pills . some fatigue and hard to lose weight RLS  :Requip helps. same dose  LIPID:  On crestor from Dr Eden Emms  and stopped for 2 months  because of muscle aches  and was better and now trying to go back on.  Just started  taking med  every other day.  was off a month total at least before current labs were done.  EYE: has an early left cataract. ? when last dexa done   3 years ago  may be due..   Pulm: no new signs or exercise intolerance   Preventive Care Screening  Mammogram:    Date:  10/04/2009    Results:  normal    Preventive Screening-Counseling & Management  Alcohol-Tobacco     Alcohol drinks/day: <1     Alcohol type: wine     Smoking Status: never  Caffeine-Diet-Exercise     Caffeine use/day: 3-4     Does Patient Exercise: no  Hep-HIV-STD-Contraception     Dental Visit-last 6 months yes     Sun Exposure-Excessive: no  Safety-Violence-Falls     Seat Belt Use: yes     Smoke Detectors: yes  Current Medications (verified): 1)  Requip 0.5 Mg  Tabs (Ropinirole Hcl) .... Take 4 Per Day As Directed 2)  Synthroid 50 Mcg Tabs (Levothyroxine Sodium) .Marland Kitchen.. 1 By Mouth Once Daily 3)  Crestor 5 Mg Tabs (Rosuvastatin Calcium) .... Every Other Day 4)  Amoxicillin 500 Mg Caps (Amoxicillin) .... As Needed Dental  Allergies (verified): No Known  Drug Allergies  Past History:  Past medical, surgical, family and social histories (including risk factors) reviewed, and no changes noted (except as noted below).  Past Medical History: Reviewed history from 11/21/2008 and no changes required. IBS Thyroid Goiter I 131 rx and then [facility]roidectomy 2005 [facility] GERD Dyspnea : nl PFTs, and echo  Ulcers Heart Murmur Asthma Headache hx stones  Nephrolithiasis, hx of G4P4       LAST Mammogram: 11/08 Pap: 2008 Td: 2003 Colonscopy: 6/09   EKG: 2007 [facility]ish[facility]     Past Surgical History: Hysterectomy 1994  Fibroid  Appendectomy 1968 Tonsillectomy 1979 ORIF left tibial plateau fracture  in the Dr. Leslee Home 2/08 Thyroidectomy  total, [facility]  large nodule  2006 Plates and pins take out of left knee in June 2010  Past History:  Care Management: Orthopedics: [facility] [place]; [place] Cardiology: Eden Emms Gastroenterology: [facility]  Family History: Reviewed history from 11/21/2008 and no changes required. Family History of Arthritis Family History Breast cancer 1st degree relative <50 Family History Diabetes 1st degree relative Family History High cholesterol Family History Hypertension Family History of Stroke M 1st degree relative <  50 Family History of Cardiovascular disorder Mom had  bladder cancer.  AAA   COPD Sister  Father:  Cad  less than age 31    Aneurysm ( AAA) Mother:  Siblings:  SIS CAD age 52  COPD  Sis died 59s from copd  CAD   Social History: Reviewed history from 11/21/2008 and no changes required. Married Never Smoked Alcohol use-no Drug use-no Regular exercise-yes when possible   Grandchild   helps caretake.   To have a nother grandchild soon Dental Care w/in 6 mos.:  yes  Review of Systems  The patient denies anorexia, fever, weight loss, decreased hearing, hoarseness, chest pain, syncope, peripheral edema, prolonged cough, abdominal pain, melena, hematochezia, severe  indigestion/heartburn, hematuria, genital sores, muscle weakness, transient blindness, difficulty walking, unusual weight change, abnormal bleeding, enlarged lymph nodes, and angioedema.         has a cataract   early   Physical Exam  General:  Well-developed,well-nourished,in no acute distress; alert,appropriate and cooperative throughout examination Head:  normocephalic and atraumatic.   Eyes:  PERRL, EOMs full, conjunctiva clear  Ears:  R ear normal and L ear normal.  no external deformities.   Nose:  no external deformity and no nasal discharge.   Mouth:  pharynx pink and moist.  tonsils absent Neck:  well healed scar no masses  Breasts:  No mass, nodules, thickening, tenderness, bulging, retraction, inflamation, nipple discharge or skin changes noted.   Lungs:  Normal respiratory effort, chest expands symmetrically. Lungs are clear to auscultation, no crackles or wheezes. Heart:  Normal rate and regular rhythm. S1 and S2 normal without gallop, murmur, click, rub or other extra sounds.no lifts.   Abdomen:  Bowel sounds positive,abdomen soft and non-tender without masses, organomegaly or hernias noted. Rectal:  No external abnormalities noted. Normal sphincter tone. No rectal masses or tenderness. Genitalia:  Pelvic Exam:        External: normal female genitalia without lesions or masses        Vagina: normal without lesions or masses        Cervix: absent         Adnexa: normal bimanual exam without masses or fullness        Uterus absent        Pap smear: not performed Msk:  well healed scar left knee no effusion Pulses:  pulses intact without delay   Extremities:  no clubbing cyanosis or edema  Neurologic:  alert & oriented X3, strength normal in all extremities, gait normal, and DTRs symmetrical and normal.   Skin:  turgor normal, color normal, no ecchymoses, and no petechiae.  no stria  Cervical Nodes:  No lymphadenopathy noted Axillary Nodes:  No palpable  lymphadenopathy Inguinal Nodes:  No significant adenopathy Psych:  Normal eye contact, appropriate affect. Cognition appears normal.    Impression & Recommendations:  Problem # 1:  PREVENTIVE HEALTH CARE (ICD-V70.0) Discussed nutrition,exercise,diet,healthy weight, vitamin D and calcium.    declined  flu shot  rec she get this at least to protect grand child  she will think about this   Problem # 2:  HYPOTHYROIDISM (ICD-244.9) taking  regularly and now    elevated tsh .    previous rxed  had been  oncorrectly given  but last tsh was ok.    will increase med and then   follow up   perhpas weill tolerated statin better  when corrected  The following medications were removed from the medication list:  Synthroid 50 Mcg Tabs (Levothyroxine sodium) .Marland Kitchen... 1 by mouth once daily Her updated medication list for this problem includes:    Synthroid 88 Mcg Tabs (Levothyroxine sodium) .Marland Kitchen... 1po once daily  Labs Reviewed: TSH: 19.35 (11/20/2009)    HgBA1c: 6.1 (12/28/2007) Chol: 246 (11/20/2009)   HDL: 52.90 (11/20/2009)   LDL: 77 (11/13/2008)   TG: 167.0 (11/20/2009)  Problem # 3:  SYNDROME, RESTLESS LEGS (ICD-333.94) continue meds check iron levels next blood tests .  Problem # 4:  PURE HYPERCHOLESTEROLEMIA (ICD-272.0) strong fam hx of   coronary artery disease  premature  vascular disease  Her updated medication list for this problem includes:    Crestor 5 Mg Tabs (Rosuvastatin calcium) ..... 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   but could be    aggravated by    hypothyroid  state   Problem # 9:  LIVER FUNCTION TESTS, ABNORMAL (ICD-794.8) Assessment: Improved normal toay presumed fatty liver in the past on [place].  Complete Medication List: 1)  Requip 0.5 Mg Tabs (Ropinirole hcl) .... Take 4 per day as directed 2)  Crestor 5 Mg Tabs (Rosuvastatin calcium) .... Every other day 3)  Amoxicillin 500 Mg Caps (Amoxicillin) .... As needed dental 4)  Synthroid 88 Mcg Tabs (Levothyroxine sodium) .Marland Kitchen.. 1po once daily  Patient Instructions: 1)  Iincrease synthroid medication.   2)   In 2-3 months check TSH  and lipid   and IBC  panel,Ferritin and  B12 level  esr   Vit D level    osteopenia 3)  Dx  RLS , Hypothyroid.  hyperlipdemia .  Contraindications/Deferment of Procedures/Staging:    Test/Procedure: FLU VAX    Reason for deferment: patient declined  Prescriptions: SYNTHROID 88 MCG TABS (LEVOTHYROXINE SODIUM) 1po once daily Brand medically necessary #30 x 4   Entered and Authorized by:   Madelin Headings MD   Signed by:   Madelin Headings MD on 11/27/2009   Method used:   [facility]        [phone]* (retail)       [address] [place], [place]  [address]       Ph: [phone] or [phone]       Fax: [phone]   RxID:   [id number]    Orders Added: 1)  Est. Patient 40-64 years [99396] 2)  Est. Patient Level III [phone]

## 2010-03-16 ENCOUNTER — Other Ambulatory Visit: Payer: Self-pay | Admitting: Internal Medicine

## 2010-04-25 ENCOUNTER — Other Ambulatory Visit: Payer: Self-pay | Admitting: Internal Medicine

## 2010-04-25 DIAGNOSIS — E785 Hyperlipidemia, unspecified: Secondary | ICD-10-CM

## 2010-04-25 DIAGNOSIS — G2581 Restless legs syndrome: Secondary | ICD-10-CM

## 2010-04-25 DIAGNOSIS — M8580 Other specified disorders of bone density and structure, unspecified site: Secondary | ICD-10-CM | POA: Insufficient documentation

## 2010-04-25 DIAGNOSIS — E039 Hypothyroidism, unspecified: Secondary | ICD-10-CM

## 2010-04-25 NOTE — Telephone Encounter (Signed)
Pt is due for the following labs, tsh, lipid, ibc, ferritin, vit b12, esr, lfts and vit d. Order placed in epic and letter mailed to pt to make a fasting lab appointment. Rx sent to pharmacy.

## 2010-05-19 ENCOUNTER — Other Ambulatory Visit (INDEPENDENT_AMBULATORY_CARE_PROVIDER_SITE_OTHER): Payer: BC Managed Care – PPO | Admitting: Internal Medicine

## 2010-05-19 DIAGNOSIS — E785 Hyperlipidemia, unspecified: Secondary | ICD-10-CM

## 2010-05-19 DIAGNOSIS — M8580 Other specified disorders of bone density and structure, unspecified site: Secondary | ICD-10-CM

## 2010-05-19 DIAGNOSIS — M899 Disorder of bone, unspecified: Secondary | ICD-10-CM

## 2010-05-19 DIAGNOSIS — E039 Hypothyroidism, unspecified: Secondary | ICD-10-CM

## 2010-05-19 DIAGNOSIS — G2581 Restless legs syndrome: Secondary | ICD-10-CM

## 2010-05-19 LAB — HEPATIC FUNCTION PANEL
ALT: 28 U/L (ref 0–35)
AST: 23 U/L (ref 0–37)
Albumin: 3.9 g/dL (ref 3.5–5.2)
Alkaline Phosphatase: 50 U/L (ref 39–117)
Bilirubin, Direct: 0.1 mg/dL (ref 0.0–0.3)
Total Protein: 6.1 g/dL (ref 6.0–8.3)

## 2010-05-19 LAB — IBC PANEL
Iron: 76 ug/dL (ref 42–145)
Saturation Ratios: 20 % (ref 20.0–50.0)

## 2010-05-19 LAB — LIPID PANEL: Total CHOL/HDL Ratio: 5

## 2010-05-19 LAB — VITAMIN B12: Vitamin B-12: 290 pg/mL (ref 211–911)

## 2010-05-19 LAB — FERRITIN: Ferritin: 66.6 ng/mL (ref 10.0–291.0)

## 2010-05-28 ENCOUNTER — Telehealth: Payer: Self-pay | Admitting: Internal Medicine

## 2010-05-28 NOTE — Telephone Encounter (Signed)
Pt called req to get lab results. Pt call asap. Pt waiting to get med refilled.

## 2010-05-29 ENCOUNTER — Telehealth: Payer: Self-pay | Admitting: *Deleted

## 2010-05-29 MED ORDER — SYNTHROID 88 MCG PO TABS: 88.0000 ug | ORAL_TABLET | Freq: Every day | ORAL | Status: DC

## 2010-05-29 NOTE — Telephone Encounter (Signed)
Needs labs results today, out of medication and needs results before refill can be authorized.  Please call pt today and advise.

## 2010-05-29 NOTE — Telephone Encounter (Signed)
Per Dr. Fabian Sharp- stay on the same dose and mail pt a copy of the results. Results mailed to pt. Pt aware and needs rx called  Pt wants to know if there is a cholesterol medication that she can take to help lower her trig. Without causing leg cramps. Pt is currently doing weight watchers and eating more fruit and vegs.

## 2010-06-10 NOTE — Op Note (Signed)
NAME:  [name], [name]            ACCOUNT NO.:  [id number]   MEDICAL RECORD NO.:  [id number]          PATIENT TYPE:  AMB   LOCATION:  DAY                          FACILITY:  [facility]   PHYSICIAN:  Marlowe Kays, M.D.  DATE OF BIRTH:  Mar 20, 1950   DATE OF PROCEDURE:  07/05/2006  DATE OF DISCHARGE:                               OPERATIVE REPORT   PREOPERATIVE DIAGNOSIS:  Internal derangement, left knee.   POSTOPERATIVE DIAGNOSES:  1. Torn medial meniscus.  2. Traumatic chondromalacia medial femoral condyle, left knee.   OPERATION:  Left knee arthroscopy with:  1. Partial medial meniscectomy.  2. Debridement of medial femoral condyle.   SURGEON:  Marlowe Kays, M.D.   ASSISTANT:  Nurse.   ANESTHESIA:  General.   JUSTIFICATION FOR PROCEDURE:  She has had two procedures on her left  knee with one being the ORIF of extensive lateral tibial plateau  fracture and the other being closed manipulation of her left knee.  Her  motion was improved following the closed manipulation, but she feels  like there is something catching in the medial knee and often when she  will flex the knee and then try to extend it something blocks her.  We  tried to get an MRI, but were unable to do so because of the hardware  present and consequently she is here for a diagnostic/therapeutic  arthroscopy of her left knee.   DESCRIPTION OF PROCEDURE:  Satisfactory general anesthesia, time-out  performed, pneumatic tourniquet with the left leg Esmarch'd out non  sterilely.  Thigh stabilizer applied.  Ace wrap and knee support for  right leg.  Left leg was prepped from stabilizer to ankle with DuraPrep  and draped in a sterile field.  Superomedial saline inflow.  First  through an anterolateral portal medial compartment knee joint was  evaluated.  She had a good bit of synovitis present with marked  disruption of the medial femoral condyle with several flaps of articular  cartilage which was not  full-thickness hanging down and particularly in  the intercondylar area, a large fragment which was still attached to the  medial femoral condyle, but creating a blocking type defect much as a  bucket-handle meniscus tear.  I debrided down the synovium with the 3.5  shaver and smoothed down the medial femoral condyle with a combination  of baskets and the 3.5 shaver, in particular removing the intercondylar  piece of articular cartilage.  This allowed me to look at the meniscus  in its entirety.  There was a small tear at the posterior curve which I  resected back to a stable rim with baskets and shaved down until smooth  with a 3.5 shaver.  Final picture of the medial joint was taken.  I then  looked on the medial gutter and suprapatellar area.  No abnormalities  were noted.  I reversed portals.  There was a little bit of synovitis  laterally which I resected.  Her ACL was intact.  Her lateral meniscus  essentially was intact.  It was adherent to the lateral tibial plateau  at about the mid third to beyond the  posterior curve, but the lateral  plateau had reconstituted itself nicely and I did not feel that there  was anything that needed to be done surgically in the lateral  compartment of the knee joint.  The knee joint was then irrigated until  clear and all fluid possible removed.  The anterior two portals were  closed with 4-0 nylon.  I then injected 20 cc of 0.5% Marcaine with  adrenalin and 4 mg of morphine through the inflow apparatus which I then  closed with 4-0 nylon as well.  I then closed this portal with 4-0 nylon  as well.  Betadine, Adaptic, and dry sterile dressing were applied.  Tourniquet was released.  She tolerated the procedure well and was taken  to the recovery room in satisfactory condition with no known  complications.           ______________________________  Marlowe Kays, M.D.     JA/MEDQ  D:  07/05/2006  T:  07/05/2006  Job:  [id number]
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
## 2010-06-13 NOTE — Op Note (Signed)
NAMEMarland Lin  CINA, KLUMPP            ACCOUNT NO.:  [id number]   MEDICAL RECORD NO.:  [id number]          PATIENT TYPE:  INP   LOCATION:  1515                         FACILITY:  [facility]   PHYSICIAN:  Marlowe Kays, M.D.  DATE OF BIRTH:  Jul 27, 1950   DATE OF PROCEDURE:  03/20/2006  DATE OF DISCHARGE:                               OPERATIVE REPORT   PREOPERATIVE DIAGNOSIS:  Comminuted depressed lateral tibial plateau  fracture left knee.   POSTOPERATIVE DIAGNOSIS:  Comminuted depressed lateral tibial plateau  fracture left knee.   OPERATION:  ORIF lateral tibial plateau fracture left knee with a six-  hole L plate and allograft bone.   SURGEON:  Dr. Simonne Come   ASSISTANT:  Dr. Paula Libra.   ANESTHESIA:  General.   PATHOLOGY AND JUSTIFICATION FOR PROCEDURE:  She tripped and fell last  night at a [employer].  I saw her in the [facility] Emergency  Room yesterday evening.  X-rays demonstrated a badly comminuted anterior  and central depression fracture lateral tibial plateau left knee.  No  other apparent injuries at that time.  She is here today for ORIF.  Risks and complications and potential for long-term potential for  traumatic arthritis were all discussed with the patient and her family.   PROCEDURE:  Prophylactic antibiotics, satisfactory general anesthesia.  She is placed on fracture table and traction and rotation were placed on  the leg until we had improvement in the fracture position, pneumatic  tourniquet applied, and left leg was Esmarched out nonsterilely and  tourniquet inflated to 300 mmHg.  Then prepped the leg from tourniquet  to ankle with DuraPrep, draped in a sterile field, Ioban employed.  Lateral incision in the distal knee down over the anterior lateral  tibia.  Incision was made into the joint.  The lateral meniscus was  identified, protected.  I did not see any tear.  I extended the incision  with subperiosteal dissection distally detaching the  muscle off the  lateral tibia.  She had a large flap of lateral tibia which was opened  anteriorly.  Through it, we were able to see fragments of the articular  surface bone which had been severely depressed, and 1 case of fragment  was rotated.  I elevated off the fragments and reestablished the  articular surface, visualization both inferiorly and into the joint.  We  also took radiographs with the flap of bone held tightly with K-wires to  see about reestablishment of the articular surface.  AP and lateral C-  arm films were taken.  Based on this, I then selected an L plate with  the short portion of the plate extending posteriorly just prior to the  metaphyseal flare area and the four-hole longer arm of the plate along  the lateral tibial shaft with pictures on the C-arm establishing that  this was a nice fit and extended beyond the fracture site distally with  some of the fracture lines looking like they went all the over medially.  We then went ahead and stabilized the long arm of the plate to the  tibial shaft with 4 screws  which were not fully tightened down initially  until we made sure that the short wing of the L was in good position.  These screws were then tightened down and proximally I used first a K-  wire to establish the direction of the screw followed by checking to be  sure that with the drill bit in place that the screw direction was where  we wanted and then placing 2 fixation screws proximally.  In each case,  all screws were measured but not tapped.  Final pictures were taken in  AP and lateral projections which demonstrated the screws, plate, and  fracture all appeared to be in good position.  The wound was then well  irrigated with sterile saline.  I tacked down the perimeter of the  lateral meniscus with interrupted 2-0 Vicryl, placed a small bulb  Hemovac beneath the lateral tibial musculature and then out proximally  subcutaneously proximal to the incision and  then closed the wound over  it with interrupted #1 Vicryl in the fascia lata and in the fascia  distally, 0  Vicryl in subcutaneous tissue, staples in the skin.  Betadine, Adaptic dry sterile dressing were applied.  Tourniquet was  released, knee immobilizer applied.  She tolerated the procedure well  and was taken to the recovery room in satisfactory condition with no  known complications.  Estimated blood loss was 125 mL, no blood  replacement.           ______________________________  Marlowe Kays, M.D.     JA/MEDQ  D:  03/20/2006  T:  03/20/2006  Job:  [id number]

## 2010-06-13 NOTE — Op Note (Signed)
NAME:  [name], CHABOT            ACCOUNT NO.:  [id number]   MEDICAL RECORD NO.:  [id number]          PATIENT TYPE:  AMB   LOCATION:  DAY                          FACILITY:  [facility]   PHYSICIAN:  Marlowe Kays, M.D.  DATE OF BIRTH:  1951-01-20   DATE OF PROCEDURE:  06/01/2006  DATE OF DISCHARGE:                               OPERATIVE REPORT   PREOPERATIVE DIAGNOSIS:  Stiff left knee status post open reduction and  internal fixation for lateral tibial plateau fracture.   POSTOPERATIVE DIAGNOSIS:  Stiff left knee status post open reduction and  internal fixation for lateral tibial plateau fracture.   OPERATION:  Closed manipulation left knee.   SURGEON:  Marlowe Kays, M.D.   ASSISTANT:  Nurse.   ANESTHESIA:  General.   JUSTIFICATION FOR PROCEDURE:  She had ORIF of a lateral tibial plateau  fracture approximately ten weeks ago.  X-rays yesterday have  demonstrated the fracture to be healed, the hardware was in good  position.  Despite aggressive physical therapy, she has not been able to  passively flex the knee more 57 degrees.  Accordingly, she is here today  for the above mentioned surgical manipulation.   PROCEDURE:  After satisfactory general anesthesia, I brought her left  leg off the edge of the operating table and, supporting her thigh on my  left forearm, gently began downward traction on her right tibia, going  back between flexion/extension and exerting strong but gentle force  until we were able to plateau improvement at about 110 degrees of  flexion, her right knee went to 120 degrees.  At this point, I felt that  discretion was the better part of valor and elected to terminate the  manipulation at this point. She will resume physical therapy in two  days.           ______________________________  Marlowe Kays, M.D.     JA/MEDQ  D:  06/01/2006  T:  06/01/2006  Job:  [id number]
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
## 2010-06-13 NOTE — Discharge Summary (Signed)
NAMEMarland Lin  TERESSA, MCGLOCKLIN            ACCOUNT NO.:  [id number]   MEDICAL RECORD NO.:  [id number]          PATIENT TYPE:  INP   LOCATION:  1515                         FACILITY:  [facility]   PHYSICIAN:  Marlowe Kays, M.D.  DATE OF BIRTH:  10-Jul-1950   DATE OF ADMISSION:  03/19/2006  DATE OF DISCHARGE:  03/25/2006                               DISCHARGE SUMMARY   ADMISSION DIAGNOSES:  1. Depressed tibial plateau fracture of the left knee.  2. Hypothyroidism.  3. Restless leg syndrome.   DISCHARGE DIAGNOSES:  1. Depressed tibial plateau fracture of the left knee.  2. Hypothyroidism.  3. Restless leg syndrome.   PROCEDURES:  On March 20, 2006 the patient underwent open reduction,  internal fixation lateral tibial plateau fracture of the left knee with  a six hole L plate and allograft bone graft.  Dr. Jene Every  assisted.   BRIEF HISTORY:  This patient fell the evening of admission while at a  [employer].  She was seen in the emergency room by Dr. Simonne Come  and myself and a fairly comminuted anterior and central depression  fracture of the lateral tibial plateau of the left knee was seen.  Fortunately, she had no other gross injuries.  Neurovascularly she was  intact to the operative extremity.  We kept her n.p.o. and as soon as  the operating room schedule would permit, on Saturday, she was taking  for the above procedure.   COURSE IN THE HOSPITAL:  The patient tolerated surgical procedure quite  well.  However, had a considerable amount of difficulty getting about.  She had a considerable amount of pain postoperatively. We used  intravenous as well as p.o. analgesics.  It was felt that home health  would be necessary due to the fact that she had extreme difficulty  secondary to the pain to get about.  She also had extreme lack of  confidence in what she could do.  PT and OT worked diligently with the  patient.   She had some left shoulder pain and discomfort which  may have occurred  the time of injury.  This was noted on March 21, 2006.  We did an x-  ray of the shoulder and it was perfectly normal.  This resolved while  she was in the hospital.  We encouraged ankle pumps to the left lower  extremity.  She soon began ambulating in her room, getting up to go to  the bathroom.  Her daughter's helped her considerably with ADL's along  with physical therapy and occupational therapy.   Neurovascular remained grossly intact to the left foot.  We encouraged  ankle pumps with she had difficulty doing secondary to her discomfort in  the lower extremity.  Her wound remained clean and dry to her left knee.   A knee immobilizer was used when she was up and about, but it need not  be used when she is lying down.  All these instructions were gone over  with her and her daughter in detail by Dr. Simonne Come.  All questions  were encouraged and answered at the time of discharge.  The patient did have some mild nausea.  We gave her some Phenergan and  will send some home with her as well.  Home equipment was ordered for  her safety at home.   LABORATORY DATA:  Laboratory values in the hospital hematologically  showed a CBC completely within normal limits other than very mild  depressed RBC count of 3.83 and hematocrit 35.5.  Blood chemistries were  essentially normal other than her sodium being 134.  An admission chest  x-ray showed low lung volumes without active cardiopulmonary disease.  Electrocardiogram showed normal sinus rhythm with an incomplete right  bundle branch block.   CONDITION ON DISCHARGE:  Improved, stable.   PLAN:  The patient is discharged to her home.  We will give her Percocet  which she has been using in the hospital, but we will use Percocet 5/325  so we don't give her too much Tylenol, #60 given.  Robaxin 500 mg for  muscle relaxant q.5-6 hours and Phenergan for nausea.  She is to  continue to take one regular aspirin,  enteric-coated if she desires,  daily.  Return to see Dr. Simonne Come at two weeks after surgery.  She may  shower with assistance.  The knee immobilizer is to be used when she is  up and about and when she is sleeping.  She and her daughter are  encouraged to call should they have any problems or questions.      Dooley L. Cherlynn June.    ______________________________  Marlowe Kays, M.D.    DLU/MEDQ  D:  03/25/2006  T:  03/25/2006  Job:  [id number]   cc:   Neta Mends. Fabian Sharp, MD  [address] [place], [place] [address]

## 2010-07-18 ENCOUNTER — Other Ambulatory Visit: Payer: Self-pay | Admitting: Internal Medicine

## 2010-08-24 ENCOUNTER — Other Ambulatory Visit: Payer: Self-pay | Admitting: Internal Medicine

## 2010-09-25 ENCOUNTER — Other Ambulatory Visit: Payer: Self-pay | Admitting: Internal Medicine

## 2010-09-26 NOTE — Telephone Encounter (Signed)
Per Dr. Fabian Sharp ok x 2 Rx sent to pharmacy

## 2010-10-27 ENCOUNTER — Telehealth: Payer: Self-pay | Admitting: Cardiovascular Disease

## 2010-10-27 ENCOUNTER — Emergency Department (HOSPITAL_COMMUNITY): Payer: BC Managed Care – PPO

## 2010-10-27 ENCOUNTER — Inpatient Hospital Stay (HOSPITAL_COMMUNITY)
Admission: EM | Admit: 2010-10-27 | Discharge: 2010-10-28 | DRG: 143 | Disposition: A | Discharge status: Home / Self Care | Payer: BC Managed Care – PPO | Attending: Cardiology | Admitting: Cardiology

## 2010-10-27 DIAGNOSIS — G2581 Restless legs syndrome: Secondary | ICD-10-CM | POA: Diagnosis present

## 2010-10-27 DIAGNOSIS — E039 Hypothyroidism, unspecified: Secondary | ICD-10-CM | POA: Diagnosis present

## 2010-10-27 DIAGNOSIS — Z888 Allergy status to other drugs, medicaments and biological substances status: Secondary | ICD-10-CM

## 2010-10-27 DIAGNOSIS — R079 Chest pain, unspecified: Secondary | ICD-10-CM

## 2010-10-27 DIAGNOSIS — Z7982 Long term (current) use of aspirin: Secondary | ICD-10-CM

## 2010-10-27 DIAGNOSIS — K219 Gastro-esophageal reflux disease without esophagitis: Secondary | ICD-10-CM | POA: Diagnosis present

## 2010-10-27 DIAGNOSIS — Z8249 Family history of ischemic heart disease and other diseases of the circulatory system: Secondary | ICD-10-CM

## 2010-10-27 DIAGNOSIS — E669 Obesity, unspecified: Secondary | ICD-10-CM | POA: Diagnosis present

## 2010-10-27 DIAGNOSIS — R0789 Other chest pain: Principal | ICD-10-CM | POA: Diagnosis present

## 2010-10-27 DIAGNOSIS — E785 Hyperlipidemia, unspecified: Secondary | ICD-10-CM | POA: Diagnosis present

## 2010-10-27 DIAGNOSIS — J45909 Unspecified asthma, uncomplicated: Secondary | ICD-10-CM | POA: Diagnosis present

## 2010-10-27 LAB — CK TOTAL AND CKMB (NOT AT ARMC)
CK, MB: 3.3 ng/mL (ref 0.3–4.0)
Total CK: 108 U/L (ref 7–177)

## 2010-10-27 LAB — DIFFERENTIAL
Basophils Absolute: 0 10*3/uL (ref 0.0–0.1)
Basophils Relative: 1 % (ref 0–1)
Eosinophils Absolute: 0.1 10*3/uL (ref 0.0–0.7)
Eosinophils Relative: 2 % (ref 0–5)
Monocytes Absolute: 0.3 10*3/uL (ref 0.1–1.0)
Neutro Abs: 2.4 10*3/uL (ref 1.7–7.7)

## 2010-10-27 LAB — LIPASE, BLOOD: Lipase: 37 U/L (ref 11–59)

## 2010-10-27 LAB — HEMOGLOBIN A1C
Hgb A1c MFr Bld: 5.7 % — ABNORMAL HIGH (ref <5.7)
Mean Plasma Glucose: 117 mg/dL — ABNORMAL HIGH (ref <117)

## 2010-10-27 LAB — FIBRINOGEN: Fibrinogen: 273 mg/dL (ref 204–475)

## 2010-10-27 LAB — TROPONIN I: Troponin I: <0.3 ng/mL (ref <0.30)

## 2010-10-27 LAB — COMPREHENSIVE METABOLIC PANEL
AST: 18 U/L (ref 0–37)
Albumin: 4.4 g/dL (ref 3.5–5.2)
BUN: 20 mg/dL (ref 6–23)
Calcium: 9.6 mg/dL (ref 8.4–10.5)
Creatinine, Ser: 0.63 mg/dL (ref 0.50–1.10)

## 2010-10-27 LAB — CBC
MCHC: 34.6 g/dL (ref 30.0–36.0)
Platelets: 238 10*3/uL (ref 150–400)
RDW: 13.2 % (ref 11.5–15.5)

## 2010-10-27 LAB — POCT I-STAT TROPONIN I: Troponin i, poc: 0 ng/mL (ref 0.00–0.08)

## 2010-10-27 LAB — D-DIMER, QUANTITATIVE: D-Dimer, Quant: 0.38 ug/mL-FEU (ref 0.00–0.48)

## 2010-10-27 NOTE — Telephone Encounter (Signed)
Per pt telephone call - states she has been having what she thought was indigestion for 2 days and has been taking TUMS.  This has not helped. Last night she woke with chest pain and SOB that has not resolved.  The chest pain is under her left breast with radiation into shoulder and neck.  She has a strong family history of CAD.  Pt was instructed to call 911 and report to ED at [facility] for evaluation.  She states understanding. Trish aware

## 2010-10-27 NOTE — Telephone Encounter (Signed)
Pt having chest pain rate pain at a 5 or 6. Pt is a little SOB no other symptoms

## 2010-10-28 LAB — LIPID PANEL
HDL: 46 mg/dL (ref >39)
LDL Cholesterol: 152 mg/dL — ABNORMAL HIGH (ref 0–99)
Total CHOL/HDL Ratio: 5 RATIO

## 2010-10-28 LAB — CARDIAC PANEL(CRET KIN+CKTOT+MB+TROPI)
CK, MB: 2.6 ng/mL (ref 0.3–4.0)
Relative Index: INVALID (ref 0.0–2.5)
Total CK: 79 U/L (ref 7–177)
Total CK: 67 U/L (ref 7–177)
Troponin I: <0.3 ng/mL (ref <0.30)

## 2010-11-06 NOTE — H&P (Signed)
NAMEMarland Kitchen  Lin, [name]            ACCOUNT NO.:  [id number]  MEDICAL RECORD NO.:  [id number]  LOCATION:  2035                         FACILITY:  [facility]  PHYSICIAN:  Rollene Rotunda, MD, FACCDATE OF BIRTH:  Jul 30, 1950  DATE OF ADMISSION:  10/27/2010 DATE OF DISCHARGE:                             HISTORY & PHYSICAL   PRIMARY CARDIOLOGIST:  Theron Arista C. Eden Emms, MD, [facility].  PRIMARY CARE PROVIDER:  Neta Mends. Panosh, MD  PATIENT PROFILE:  This is a 60 year old female with history of atypical chest pain and family history of coronary artery disease, who presents with recurrent chest pain.  PROBLEMS: 1. Chest pain without objective evidence of ischemia.     a.     Negative dobutamine echocardiogram in January 2010. 2. Hyperlipidemia. 3. Obesity. 4. Hypothyroidism. 5. Restless legs syndrome. 6. Asthma. 7. GERD. 8. Status post hysterectomy 1994. 9. Status post appendectomy 1968. 10.Status post tonsillectomy 1979. 11.Status post ORIF of the left tibial plane, February 2008, with     subsequent left total knee arthroplasty in 2010. 12.Status post thyroidectomy in 2006.  ALLERGIES:  CRESTOR AND LIPITOR CAUSE MYALGIAS.  HISTORY OF PRESENT ILLNESS:  This is a 60 year old female with the above problem list.  The patient had negative dobutamine Myoview in January 2010 secondary to atypical chest pain.  She was followed by Dr. Eden Emms for risk factor management given significant family history of early coronary artery disease.  The patient was in her usual state of health until approximately 2:00 a.m. on September 30th, when she awoke suddenly with indigestion like 7/10 chest pain in her left breast, radiating towards her back, and feeling of flushing without dyspnea.  Pain was worse with deep breathing and rotation of the torso, and was unrelieved by Tums.  She was unable to fall back asleep and was quite restless and her symptoms in all persisted for 10-12 hours constantly and then became  intermittent for the remainder of the day yesterday.  She thinks that she may have had mild dyspnea on exertion throughout the evening yesterday.  She finally went to bed last night with intermittent pain and then awoke with more severe pain earlier this morning.  Again, the symptoms persisted in more severe fashion, and later on in the morning, she called our office, and was advised to come to the ED.  Here, cardiac enzymes are negative.  ECG is without acute changes.  She continues to complain of 6/10 chest pain that is reproducible with palpation along the lower left-sided rib border, as well as position changes and deep breathing.  HOME MEDICATIONS: 1. ReQuip 0.5 mg one at 12 p.m., one at 5:00 p.m,  and two at bedtime. 2. Synthroid 50 mcg daily.  FAMILY HISTORY:  Mother is alive at 32 with history bladder cancer and Alzheimer's.  Father died at 10 with history of MI in his 30s, as well as AAA and thoracic aortic aneurysm.  She has four brothers who are living and one sister who is living.  Her living sister has AFib.  She has three deceased siblings including a sister who died of MI at age 4, a sister died of breast cancer, and brother who died at 21 of electrocution.  SOCIAL HISTORY:  The patient lives in [place] with her husband.  She stays at home and watches her grandchildren.  She denies tobacco, alcohol, or drug use.  REVIEW OF SYSTEMS:  Positive for chest pain and dyspnea on exertion, as well as pleuritic component of her chest pain.  Reports having broken two toes about 2 weeks ago affecting her mobility.  She is a full code. Otherwise, all systems negative.</  PHYSICAL EXAMINATION:  VITAL SIGNS:  Temperature 97.7, heart rate 64, respirations 18, blood pressure 157/83, pulse ox 99% on room air. GENERAL:  A pleasant white female, in no acute distress.  She is awake, alert and oriented x3.  She has a normal affect. HEENT:  Normal. NEUROLOGIC:  Grossly  nonfocal. SKIN:  Warm and dry without lesions or masse. NECK:  Supple without bruits or JVP. LUNGS:  Respirations are unlabored.  Clear to auscultation. CARDIAC:  Regular S1 and S2.  No S3 or S4.  No murmurs. ABDOMEN:  Round, soft with tenderness noted just medial to the left midclavicular line along the left lower rib border.  Bowel sounds present x4. EXTREMITIES:  Warm, dry, pink.  No clubbing, cyanosis, or edema. Dorsalis pedis and posterior tibial pulses 1+ and equal bilaterally.  Chest x-ray shows no acute disease.  EKG sinus rhythm, rate of 63, T- wave inversion in V1 through V3, which is old.  Hemoglobin 14.2, hematocrit 41.0, WBC 4.7, platelets 238.  Sodium 140, potassium 3.8, chloride 101, CO2 of 31, BUN 20, creatinine 0.63, glucose 98, troponin 0.00.  ASSESSMENT AND PLAN: 1. Chest pain without objective evidence of ischemia.  The patient has     a long duration of left-sided chest discomfort (greater than 24     hours) worsened by rotation of the torso, arm movements, deep     breathing, and palpation along the lower rib border and is focally     worse just medial to the left midclavicular line.  Despite     duration, enzymes currently are negative and ECG is not acute.  I     will plan to admit and cycle enzymes.  We will also treat     musculoskeletal pain with tramadol.  We will check a D-dimer and     provided the D-dimer and enzymes are negative, we will likely plan     discharge in the a.m. and consider outpatient Myoview given risk     factors. 2. Hypothyroidism.  Continue Synthroid.  Check TFTs. 3. Restless legs syndrome.  Continue her home dosing of ReQuip. 4. History of gastroesophageal reflux disease.  Add proton pump     inhibitors.     Nicolasa Ducking, ANP   ______________________________ Rollene Rotunda, MD, [facility]    CB/MEDQ  D:  10/27/2010  T:  10/28/2010  Job:  [id number]  Electronically Signed by Nicolasa Ducking ANP on 10/28/2010 06:12:44  PM Electronically Signed by Rollene Rotunda MD [facility] on 11/06/2010 01:39:25 PM

## 2010-11-13 ENCOUNTER — Telehealth: Payer: Self-pay | Admitting: Internal Medicine

## 2010-11-13 LAB — PROTIME-INR: INR: 1

## 2010-11-13 LAB — APTT: aPTT: 27

## 2010-11-13 LAB — HEMOGLOBIN AND HEMATOCRIT, BLOOD: Hemoglobin: 12.4

## 2010-11-13 NOTE — Telephone Encounter (Signed)
Pt is sch for 11-17-2010 11.15am

## 2010-11-13 NOTE — Telephone Encounter (Signed)
sure

## 2010-11-13 NOTE — Telephone Encounter (Signed)
According to Discharge Note: Pt was Discharged on 10/28/10 and needs to follow up with Dr. Fabian Sharp in 1-2 weeks then with Dr. Eden Emms as needed.

## 2010-11-13 NOTE — Telephone Encounter (Signed)
Pt was discharged from [facility] on 11-04-10. Pt is requesting post hosp fup before end of  Oct. Can I create 30 min slot on Monday 11-17-10

## 2010-11-16 NOTE — Discharge Summary (Signed)
NAMEMarland Kitchen  [name], [name]            ACCOUNT NO.:  [id number]  MEDICAL RECORD NO.:  [id number]  LOCATION:  2035                         FACILITY:  [facility]  PHYSICIAN:  Jesse Sans. Shondra Capps, MD, FACCDATE OF BIRTH:  Jun 19, 1950  DATE OF ADMISSION:  10/27/2010 DATE OF DISCHARGE:  10/28/2010                              DISCHARGE SUMMARY   PRIMARY CARDIOLOGIST:  Theron Arista C. Eden Emms, MD, [facility]  PRIMARY CARE PROVIDER:  Neta Mends. Panosh, MD  DISCHARGE DIAGNOSIS:  Musculoskeletal chest pain.  SECONDARY DIAGNOSES: 1. History of atypical chest pain status post negative dobutamine     echocardiogram January 2010. 2. Obesity. 3. Hyperlipidemia. 4. Hypothyroidism. 5. Restless leg syndrome. 6. Asthma. 7. Gastroesophageal reflux disease. 8. Status post hysterectomy 1994. 9. Status post appendectomy 1968. 10.Status post tonsillectomy 1979. 11.Status post open reduction and internal fixation of the left tibia     February 2008. 12.Status post thyroidectomy 2006. 13.Status post left total knee arthroplasty 2010.  ALLERGIES:  CRESTOR and LIPITOR cause myalgias.  PROCEDURES:  None.  HISTORY OF PRESENT ILLNESS:  A 60 year old female with history of atypical chest pain and family history of coronary artery disease who has had a negative dobutamine echo in the past.  The patient was in her usual state of health until 2 a.m. on October 26, 2010, when she woke suddenly with indigestion like 7/10 chest pain under her left breast and feeling of flushing without dyspnea.  Pain was worse with deep breathing, rotation of torso, and unrelieved with Tums.  Pain persisted for 10-12 hours prior to becoming more intermittent in nature.  She had recurrence of chest pain on the morning of admission prompting her to present to the [facility] ED where ECG was without acute changes.  She continued to report chest pain that was worsened by movement, deep breathing, and also was reproducible with palpation along the left  lower rib margin just medial to the left midclavicular line.  Because she was having ongoing pain with a family history of significant CAD we admitted her for rule out.  HOSPITAL COURSE:  The patient ruled out for MI.  ECG remained nonacute. Symptoms were treated with p.r.n. Ultram.  I had a long discussion with the patient explaining management of musculoskeletal chest pain and that we do not plan further ischemic workup at this time.  The patient will be discharged home today in good condition.  DISCHARGE LABORATORIES:  Hemoglobin 14.2, hematocrit 41.0, WBC 4.7, platelets 238, INR 0.97.  Sodium 140, potassium 3.8, chloride 101, CO2 31, BUN 20, creatinine 0.63, glucose 98.  Total bilirubin 0.9, alkaline phosphatase 53, AST 18, ALT 22, total protein 7.1, albumin 4.4, calcium 9.6 hemoglobin A1c 5.7, CK 79, MB 2.8, troponin-I less than 0.30.  Total cholesterol 232, triglycerides 171, HDL 46, LDL 152.  TSH 3.319.  DISPOSITION:  The patient will be discharged home today in good condition.  FOLLOWUP PLANS AND APPOINTMENTS:  The patient will follow up with Dr. Fabian Sharp in the next 1-2 weeks.  Will see Dr. Eden Emms as needed.  DISCHARGE MEDICATIONS: 1. Aspirin 81 mg daily. 2. Protonix 40 mg daily. 3. Aleve 220 mg b.i.d. p.r.n. 4. Ropinirole 0.5 mg 1 tablet at noon, 1 tablet  at 5 p.m., and 2     tablets at bedtime. 5. Synthroid 88 mcg daily.  OUTSTANDING LABORATORY STUDIES:  None.  DURATION OF DISCHARGE ENCOUNTER:  45 minutes including physician time.     Nicolasa Ducking, ANP   ______________________________ Jesse Sans. Daleen Squibb, MD, [facility]    CB/MEDQ  D:  10/28/2010  T:  10/28/2010  Job:  [id number]  cc:   Neta Mends. Fabian Sharp, MD  Electronically Signed by Nicolasa Ducking ANP on 11/05/2010 07:12:19 PM Electronically Signed by Valera Castle MD [facility] on 11/16/2010 01:49:43 PM

## 2010-11-17 ENCOUNTER — Ambulatory Visit (INDEPENDENT_AMBULATORY_CARE_PROVIDER_SITE_OTHER): Payer: BC Managed Care – PPO | Admitting: Internal Medicine

## 2010-11-17 ENCOUNTER — Encounter: Payer: Self-pay | Admitting: Internal Medicine

## 2010-11-17 VITALS — BP 120/82 | HR 75 | Temp 98.3°F | Wt 155.0 lb

## 2010-11-17 DIAGNOSIS — G479 Sleep disorder, unspecified: Secondary | ICD-10-CM

## 2010-11-17 DIAGNOSIS — Z8249 Family history of ischemic heart disease and other diseases of the circulatory system: Secondary | ICD-10-CM

## 2010-11-17 DIAGNOSIS — M899 Disorder of bone, unspecified: Secondary | ICD-10-CM

## 2010-11-17 DIAGNOSIS — R5381 Other malaise: Secondary | ICD-10-CM

## 2010-11-17 DIAGNOSIS — M79606 Pain in leg, unspecified: Secondary | ICD-10-CM | POA: Insufficient documentation

## 2010-11-17 DIAGNOSIS — E039 Hypothyroidism, unspecified: Secondary | ICD-10-CM

## 2010-11-17 DIAGNOSIS — M949 Disorder of cartilage, unspecified: Secondary | ICD-10-CM

## 2010-11-17 DIAGNOSIS — Z87442 Personal history of urinary calculi: Secondary | ICD-10-CM

## 2010-11-17 DIAGNOSIS — R5383 Other fatigue: Secondary | ICD-10-CM

## 2010-11-17 DIAGNOSIS — M79609 Pain in unspecified limb: Secondary | ICD-10-CM

## 2010-11-17 DIAGNOSIS — G2581 Restless legs syndrome: Secondary | ICD-10-CM

## 2010-11-17 LAB — CBC WITH DIFFERENTIAL/PLATELET
Basophils Relative: 0.7 % (ref 0.0–3.0)
Eosinophils Relative: 0.7 % (ref 0.0–5.0)
Lymphocytes Relative: 34.4 % (ref 12.0–46.0)
MCV: 96.7 fl (ref 78.0–100.0)
Monocytes Relative: 5.6 % (ref 3.0–12.0)
Neutrophils Relative %: 58.6 % (ref 43.0–77.0)
Platelets: 264 10*3/uL (ref 150.0–400.0)
RBC: 4.13 Mil/uL (ref 3.87–5.11)
WBC: 4.3 10*3/uL — ABNORMAL LOW (ref 4.5–10.5)

## 2010-11-17 LAB — IBC PANEL
Iron: 93 ug/dL (ref 42–145)
Transferrin: 299.6 mg/dL (ref 212.0–360.0)

## 2010-11-17 LAB — FOLATE: Folate: 20.8 ng/mL (ref >5.9)

## 2010-11-17 LAB — SEDIMENTATION RATE: Sed Rate: 13 mm/hr (ref 0–22)

## 2010-11-17 LAB — TSH: TSH: 1.45 u[IU]/mL (ref 0.35–5.50)

## 2010-11-17 LAB — VITAMIN B12: Vitamin B-12: 255 pg/mL (ref 211–911)

## 2010-11-17 NOTE — Assessment & Plan Note (Signed)
Bilateral leg pain worsening  Over last month And interfering with sleep but apparently is 24 hour situation worse with laying and better with standing.    Has been under rx for rls but this is atypical  . Need further evaluation .   Would begin with seeing neurology/ sleep about this dx and poss further eval . ? If atypical back radiation.  And check inflammatory markers for pmr etc.

## 2010-11-17 NOTE — Patient Instructions (Addendum)
I think  You are exhausted because your leg pain keep you up at night and  sleep deprived so we need to evaluate for this .   Will notify you  of labs when available.  and will do a referral to  Dr Richardean Chimera  Neurology sleep to get her opinion . Is this  Restless leg or other reason for sleep interruption I think you  should follow up with  Specialist at [facility] also to make sure you dont have a back problem adding to the pain. Marland Kitchen

## 2010-11-17 NOTE — Progress Notes (Signed)
Subjective:    Patient ID: [name], female    DOB: [birth year], 60 y.o.   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    Taking NSAID per rec w. Sx still come and go and is chronically dyspneic.  Plans on making fu with Dr Eden Emms her cardiologist as is due. Asks about possibility of this being a "blocked artery"  Stopped statin this year cause of ? If causing her leg pains. Had been  Doing pretty well with good energy losing weight with WW in March and exercising and then in the past month ( since September ) something changes and is very tired  . Craving sugars and went off her healthy  ls .? If getting  thyroid issues again.    Bp up in hospital  150 range or more.  Sleep  : Up at night almost  Every hours or so with bilateral   Leg pains and aches  .   Has to stand  To help.  Legs aches  Had been going on for a while and now worse   Describes pain 6/10 - 12 /10 ranges . pain sometimes on hip area and radationdown leg. Better with standing up.  Has seen knee  Ortho at [facility] and not felt to be knee and to come back in  December  Poss eval for nerve pain.   No falling no osa sx . Knows "something is not right"     Review of Systems No fever night sweats  Wasting unusual rashes acute joint swelling but some stiffness No bleeding or change in bowel habits   Past history family history social history reviewed in the electronic medical record.     Objective:   Physical Exam  WDWN in and looks sleepy   HEENT: Normocephalic ;atraumatic , Eyes;  PERRL, EOMs  Full, lids and conjunctiva clear,,Ears: no deformities, canals nl, TM landmarks normal, Nose: no deformity or discharge  Mouth : OP clear without lesion or edema . Neck: Supple without adenopathy or masses or bruits Chest:  Clear to A&P without wheezes rales or rhonchi CV:  S1-S2 no gallops or murmurs peripheral perfusion is normal Abdomen:   Sof,t normal bowel sounds without hepatosplenomegaly, no guarding rebound or masses no CVA tenderness Neuro oriented x 3  Cn seem ok  Gait wnl neg slr  No focal atrophy  Non tender back area  Oriented x 3 and no noted deficits in memory, attention, and speech. Reviewed Hosp record and labs that we could find . Lab Results  Component Value Date   WBC 4.7 10/27/2010   HGB 14.2 10/27/2010   HCT 41.0 10/27/2010   PLT 238 10/27/2010   GLUCOSE 98 10/27/2010   CHOL 232* 10/28/2010   TRIG 171* 10/28/2010   HDL 46 10/28/2010   LDLDIRECT 143.2 05/19/2010   LDLCALC 152* 10/28/2010   ALT 22 10/27/2010   AST 18 10/27/2010   NA 140 10/27/2010   K 3.8 10/27/2010   CL 101 10/27/2010   CREATININE 0.63 10/27/2010   BUN 20 10/27/2010   CO2 31 10/27/2010   TSH 3.319 10/27/2010   INR 0.97 10/27/2010   HGBA1C 5.7* 10/27/2010       Assessment & Plan:  [facility] hosp for atypical chest pain  Family hx of  Heart disease See consult has many ? About cv health . Tried to answer these. Advise she fu with  Dr Eden Emms as she had planned for further advise.  Bilateral leg pain worsening  Over last month And interfering with sleep but apparently is 24 hour situation worse with laying and better with standing.    Has been under rx for rls but this is atypical  . Need further evaluation .   Would begin with seeing neurology/ sleep about this dx and poss further eval . ? If atypical back radiation.  And check inflammatory markers for pmr etc.   cpk was 79 in hosp RLS clinical dx that improved with initial rx and has been on meds for a while . Unsure if correct path to continue or wean and look at other options. Will again get opinion from neuro/sleep. Fatigue  Suspect from sleep deprivations   aee above  Hypothyroid  stable repeat labs  tsh was 3.1 in hospital Hyperglycemia a1c has been ok.  5.7 in hosp Obesity  Do not see other causes  Multi factorial  Total visit 40 mins > 50% spent counseling and coordinating care   Data review  In ehr  at visit.

## 2010-11-18 LAB — CYCLIC CITRUL PEPTIDE ANTIBODY, IGG: Cyclic Citrullin Peptide Ab: <2 U/mL (ref 0.0–5.0)

## 2010-11-18 LAB — C-REACTIVE PROTEIN: CRP: 0.21 mg/dL (ref <0.60)

## 2010-11-19 LAB — ALDOLASE: Aldolase: 5.2 U/L (ref <=8.1)

## 2010-11-20 ENCOUNTER — Encounter: Payer: Self-pay | Admitting: *Deleted

## 2010-11-23 ENCOUNTER — Encounter: Payer: Self-pay | Admitting: Internal Medicine

## 2010-11-23 DIAGNOSIS — G479 Sleep disorder, unspecified: Secondary | ICD-10-CM | POA: Insufficient documentation

## 2010-11-23 DIAGNOSIS — Z8249 Family history of ischemic heart disease and other diseases of the circulatory system: Secondary | ICD-10-CM | POA: Insufficient documentation

## 2010-12-10 ENCOUNTER — Other Ambulatory Visit: Payer: Self-pay | Admitting: Internal Medicine

## 2010-12-28 ENCOUNTER — Other Ambulatory Visit: Payer: Self-pay | Admitting: Internal Medicine

## 2011-01-21 ENCOUNTER — Other Ambulatory Visit: Payer: Self-pay | Admitting: Internal Medicine

## 2011-03-03 ENCOUNTER — Telehealth: Payer: Self-pay | Admitting: *Deleted

## 2011-03-03 NOTE — Telephone Encounter (Signed)
Ok to refill  X 1  Is she seeing sleep doctor and any change in her restless legs.  Or medications  does she want to stay on this med?Marland Kitchen

## 2011-03-03 NOTE — Telephone Encounter (Signed)
Refill on requip 0.5mg  last filled on 01/21/11 #120

## 2011-03-04 ENCOUNTER — Telehealth: Payer: Self-pay | Admitting: Internal Medicine

## 2011-03-04 NOTE — Telephone Encounter (Signed)
Pt called and said that she has been requesting refills of rOPINIRole (REQUIP) 0.5 MG tablet since last Thursday, with no response. Pls call [facility] to [facility] on [address] today. Pt is out of med.

## 2011-03-04 NOTE — Telephone Encounter (Signed)
See other phone note

## 2011-03-05 ENCOUNTER — Telehealth: Payer: Self-pay | Admitting: Internal Medicine

## 2011-03-05 MED ORDER — ROPINIROLE HCL 0.5 MG PO TABS: 0.5000 mg | ORAL_TABLET | Freq: Four times a day (QID) | ORAL | Status: DC

## 2011-03-05 NOTE — Telephone Encounter (Signed)
Med was never sent in or called in  . Called today about this. I talked with patient personally   She saw Dr D and tried on patch that cause worse leg pain and then another pill going back on  The requip for now until gets back to Neuro.    Daughter had  Heard surgery. valve replacement.    Will rx sent in med  And have her fu with neuro.

## 2011-03-05 NOTE — Telephone Encounter (Signed)
Rx sent to pharmacy   

## 2011-03-20 ENCOUNTER — Encounter: Payer: Self-pay | Admitting: Internal Medicine

## 2011-04-13 ENCOUNTER — Ambulatory Visit (INDEPENDENT_AMBULATORY_CARE_PROVIDER_SITE_OTHER): Payer: BC Managed Care – PPO | Admitting: Internal Medicine

## 2011-04-13 ENCOUNTER — Encounter: Payer: Self-pay | Admitting: Internal Medicine

## 2011-04-13 ENCOUNTER — Ambulatory Visit (INDEPENDENT_AMBULATORY_CARE_PROVIDER_SITE_OTHER)
Admission: RE | Admit: 2011-04-13 | Discharge: 2011-04-13 | Disposition: A | Discharge status: Home / Self Care | Payer: BC Managed Care – PPO | Source: Ambulatory Visit | Attending: Internal Medicine | Admitting: Internal Medicine

## 2011-04-13 VITALS — BP 120/80 | HR 92 | Temp 98.6°F | Wt 167.0 lb

## 2011-04-13 DIAGNOSIS — R05 Cough: Secondary | ICD-10-CM

## 2011-04-13 DIAGNOSIS — R059 Cough, unspecified: Secondary | ICD-10-CM

## 2011-04-13 DIAGNOSIS — K219 Gastro-esophageal reflux disease without esophagitis: Secondary | ICD-10-CM

## 2011-04-13 DIAGNOSIS — R0602 Shortness of breath: Secondary | ICD-10-CM

## 2011-04-13 DIAGNOSIS — R053 Chronic cough: Secondary | ICD-10-CM

## 2011-04-13 MED ORDER — PREDNISONE 20 MG PO TABS: ORAL_TABLET | ORAL | Status: AC

## 2011-04-13 MED ORDER — ALBUTEROL SULFATE HFA 108 (90 BASE) MCG/ACT IN AERS: 2.0000 | INHALATION_SPRAY | Freq: Four times a day (QID) | RESPIRATORY_TRACT | Status: DC | PRN

## 2011-04-13 MED ORDER — DOXYCYCLINE HYCLATE 100 MG PO TABS: 100.0000 mg | ORAL_TABLET | Freq: Two times a day (BID) | ORAL | Status: AC

## 2011-04-13 NOTE — Progress Notes (Signed)
  Subjective:    Patient ID: [name], female    DOB: [birth year], 61 y.o.   MRN: [id number]  HPI Patient comes in today for SDA for  new problem evaluation. 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 No fever  Coughing up some phelgm and now worse. And thicker like" yellow gook."  Not a lot of nose sx.  No hemoptysis . PMH asthma when pregnant.Otherwise no past hx of such?  GERD and on protonix generic. Back on   And  Got nocturnal sx when stopped.  SOB slightly worse.  As she has had chronic dyspnea the had basically a neg pulm cards work up. Restless last night.  But no chills fevers rigors.  Exposures grand children  Review of Systems Neg  Bleeding fever head changes  Some hoarseness  Past history family history social history reviewed in the electronic medical record. Outpatient Prescriptions Prior to Visit  Medication Sig Dispense Refill  . aspirin 81 MG tablet Take 81 mg by mouth daily.        . Naproxen Sodium (ALEVE) 220 MG CAPS Take by mouth.        . pantoprazole (PROTONIX) 40 MG tablet Take 40 mg by mouth.       Marland Kitchen rOPINIRole (REQUIP) 0.5 MG tablet Take 1 tablet (0.5 mg total) by mouth 4 (four) times daily. Or as directed  120 tablet  3  . SYNTHROID 88 MCG tablet TAKE 1 TABLET BY MOUTH EVERY DAY  30 tablet  6       Objective:   Physical Exam WDWN in NAD  quiet respirations; mildly congested  somewhat hoarse. Non toxic . HEENT: Normocephalic ;atraumatic , Eyes;  PERRL, EOMs  Full, lids and conjunctiva clear,,Ears: no deformities, canals nl, TM landmarks normal, Nose: no deformity or discharge but congested;face nontender Mouth : OP clear without lesion or edema . Neck: Supple without adenopathy or masses or bruits Chest:  Clear to A&P without wheezes rales or rhonchi no stridor  Dry coughing at times  CV:  S1-S2 no gallops or murmurs peripheral perfusion is normal Skin :nl perfusion and no  acute rashes       Assessment & Plan:  Cough prolonged for over 3 month with superimposed worsening croupy like  With  some phlegm   ? viral croup vs other infection    on top of chronic cough  Poss asthmatic gerd and or drip syndrome   Disc diff dx  With pt .   Get c xray   Today then plan fu  May need to see pulmonary for chronic cough    Stop the cough lozenges in the meantime ; Inhaler if needed.  Consider  Antibiotic empiric and short course pred .     Addend  X ray NAD will rx with doxy and pred and fu or call if  persistent or progressive

## 2011-04-13 NOTE — Patient Instructions (Addendum)
Unsure why  You have a chronic cough  But could be a version of asthma  Or reflux adding to this but it seems like a respiratory infection on top of the chronic cough.  Can try inhaler if needed.  Get Chest x ray  And  will notify you of result and next step . We may use antibiotic empirically  In the meantime depending on the x ray result.  If coughing continues  Advise getting a  Pulmonary consult .   Continue   The  reflux medication.   Pt aware of cxr results.

## 2011-04-18 DIAGNOSIS — R053 Chronic cough: Secondary | ICD-10-CM | POA: Insufficient documentation

## 2011-04-18 DIAGNOSIS — R05 Cough: Secondary | ICD-10-CM | POA: Insufficient documentation

## 2011-07-15 ENCOUNTER — Other Ambulatory Visit: Payer: Self-pay | Admitting: *Deleted

## 2011-07-15 MED ORDER — ROPINIROLE HCL 0.5 MG PO TABS: 0.5000 mg | ORAL_TABLET | Freq: Four times a day (QID) | ORAL | Status: DC

## 2011-07-15 MED ORDER — SYNTHROID 88 MCG PO TABS: 88.0000 ug | ORAL_TABLET | Freq: Every day | ORAL | Status: DC

## 2011-07-17 ENCOUNTER — Other Ambulatory Visit: Payer: Self-pay | Admitting: Internal Medicine

## 2011-07-17 ENCOUNTER — Other Ambulatory Visit: Payer: Self-pay | Admitting: *Deleted

## 2011-07-17 NOTE — Telephone Encounter (Signed)
Requested that pharmacy send refill request to primary care doctor. Geary Rufo Cates, CMA 

## 2011-07-17 NOTE — Telephone Encounter (Signed)
Sent to the pharmacy #30 with 5 refills.   Pt last seen on 04/13/11.

## 2011-07-17 NOTE — Telephone Encounter (Signed)
Pt is requesting synthroid,pantoprazole and ropinirole #90 each with refills [facility] [address] [phone]

## 2011-07-22 ENCOUNTER — Other Ambulatory Visit: Payer: Self-pay | Admitting: Family Medicine

## 2011-07-22 MED ORDER — PANTOPRAZOLE SODIUM 40 MG PO TBEC: 40.0000 mg | DELAYED_RELEASE_TABLET | Freq: Every day | ORAL | Status: DC

## 2011-07-22 MED ORDER — ROPINIROLE HCL 0.5 MG PO TABS: 0.5000 mg | ORAL_TABLET | Freq: Four times a day (QID) | ORAL | Status: DC

## 2011-07-22 MED ORDER — SYNTHROID 88 MCG PO TABS: 88.0000 ug | ORAL_TABLET | Freq: Every day | ORAL | Status: DC

## 2011-07-22 NOTE — Telephone Encounter (Signed)
Synthroid sent #90 with 0 refills.  TSH level needs to be done soon.  Last done on 11/17/10.  Pantoprazole and ropinirole sent #90 with 2 refills.  Enough for 1 year since last appt (04/13/11).

## 2011-07-29 ENCOUNTER — Other Ambulatory Visit: Payer: Self-pay | Admitting: Internal Medicine

## 2011-07-29 NOTE — Telephone Encounter (Signed)
Last OV and TSH was 11-17-10

## 2011-12-22 ENCOUNTER — Other Ambulatory Visit (INDEPENDENT_AMBULATORY_CARE_PROVIDER_SITE_OTHER): Payer: BC Managed Care – PPO

## 2011-12-22 DIAGNOSIS — Z0000 Encounter for general adult medical examination without abnormal findings: Secondary | ICD-10-CM

## 2011-12-22 LAB — CBC WITH DIFFERENTIAL/PLATELET
Basophils Absolute: 0 10*3/uL (ref 0.0–0.1)
Hemoglobin: 13.8 g/dL (ref 12.0–15.0)
Lymphocytes Relative: 34.7 % (ref 12.0–46.0)
Monocytes Relative: 7.7 % (ref 3.0–12.0)
Neutro Abs: 2.5 10*3/uL (ref 1.4–7.7)
RDW: 13.5 % (ref 11.5–14.6)

## 2011-12-22 LAB — HEPATIC FUNCTION PANEL
AST: 23 U/L (ref 0–37)
Albumin: 4 g/dL (ref 3.5–5.2)
Alkaline Phosphatase: 85 U/L (ref 39–117)
Bilirubin, Direct: 0.1 mg/dL (ref 0.0–0.3)
Total Bilirubin: 1 mg/dL (ref 0.3–1.2)

## 2011-12-22 LAB — LIPID PANEL
Cholesterol: 240 mg/dL — ABNORMAL HIGH (ref 0–200)
VLDL: 44.2 mg/dL — ABNORMAL HIGH (ref 0.0–40.0)

## 2011-12-22 LAB — BASIC METABOLIC PANEL
Calcium: 9.1 mg/dL (ref 8.4–10.5)
GFR: 86.02 mL/min (ref >60.00)
Glucose, Bld: 100 mg/dL — ABNORMAL HIGH (ref 70–99)
Sodium: 138 mEq/L (ref 135–145)

## 2011-12-22 LAB — POCT URINALYSIS DIPSTICK
Bilirubin, UA: NEGATIVE
Ketones, UA: NEGATIVE
Leukocytes, UA: NEGATIVE
Nitrite, UA: NEGATIVE
Protein, UA: NEGATIVE
pH, UA: 5.5

## 2011-12-29 ENCOUNTER — Encounter: Payer: BC Managed Care – PPO | Admitting: Internal Medicine

## 2012-01-04 ENCOUNTER — Encounter: Payer: BC Managed Care – PPO | Admitting: Internal Medicine

## 2012-01-04 DIAGNOSIS — Z0289 Encounter for other administrative examinations: Secondary | ICD-10-CM

## 2012-01-25 ENCOUNTER — Ambulatory Visit (INDEPENDENT_AMBULATORY_CARE_PROVIDER_SITE_OTHER): Payer: BC Managed Care – PPO | Admitting: Internal Medicine

## 2012-01-25 ENCOUNTER — Encounter: Payer: Self-pay | Admitting: Internal Medicine

## 2012-01-25 VITALS — BP 124/82 | HR 80 | Temp 98.2°F | Ht 60.25 in | Wt 162.0 lb

## 2012-01-25 DIAGNOSIS — E039 Hypothyroidism, unspecified: Secondary | ICD-10-CM

## 2012-01-25 DIAGNOSIS — E785 Hyperlipidemia, unspecified: Secondary | ICD-10-CM

## 2012-01-25 DIAGNOSIS — R7309 Other abnormal glucose: Secondary | ICD-10-CM

## 2012-01-25 DIAGNOSIS — R209 Unspecified disturbances of skin sensation: Secondary | ICD-10-CM

## 2012-01-25 DIAGNOSIS — G2581 Restless legs syndrome: Secondary | ICD-10-CM

## 2012-01-25 DIAGNOSIS — Z0000 Encounter for general adult medical examination without abnormal findings: Secondary | ICD-10-CM

## 2012-01-25 DIAGNOSIS — Z8249 Family history of ischemic heart disease and other diseases of the circulatory system: Secondary | ICD-10-CM

## 2012-01-25 DIAGNOSIS — R202 Paresthesia of skin: Secondary | ICD-10-CM

## 2012-01-25 MED ORDER — PRAVASTATIN SODIUM 20 MG PO TABS: 20.0000 mg | ORAL_TABLET | Freq: Every day | ORAL | Status: DC

## 2012-01-25 NOTE — Patient Instructions (Addendum)
Intensify lifestyle interventions. Check into the shingles vaccine coverage and you can get this at any time. Try pravachol  Less likely to cause se than the lipitor   Plan repeat lipids and hg a1c B12 level in about   2 months and then rov  and see how  This does Avoid simple sugars and sweets simple carbs and see how this does.  You probably have prediabetes and have a tendency to have her blood sugar swings  after you eat simple carbohydrates. This possible to make you feel bad.  We'll review your record to check for the neurology evaluation  Preventive Care for Adults, Female A healthy lifestyle and preventive care can promote health and wellness. Preventive health guidelines for women include the following key practices.  A routine yearly physical is a good way to check with your caregiver about your health and preventive screening. It is a chance to share any concerns and updates on your health, and to receive a thorough exam.  Visit your dentist for a routine exam and preventive care every 6 months. Brush your teeth twice a day and floss once a day. Good oral hygiene prevents tooth decay and gum disease.  The frequency of eye exams is based on your age, health, family medical history, use of contact lenses, and other factors. Follow your caregiver's recommendations for frequency of eye exams.  Eat a healthy diet. Foods like vegetables, fruits, whole grains, low-fat dairy products, and lean protein foods contain the nutrients you need without too many calories. Decrease your intake of foods high in solid fats, added sugars, and salt. Eat the right amount of calories for you.Get information about a proper diet from your caregiver, if necessary.  Regular physical exercise is one of the most important things you can do for your health. Most adults should get at least 150 minutes of moderate-intensity exercise (any activity that increases your heart rate and causes you to sweat) each week.  In addition, most adults need muscle-strengthening exercises on 2 or more days a week.  Maintain a healthy weight. The body mass index (BMI) is a screening tool to identify possible weight problems. It provides an estimate of body fat based on height and weight. Your caregiver can help determine your BMI, and can help you achieve or maintain a healthy weight.For adults 20 years and older:  A BMI below 18.5 is considered underweight.  A BMI of 18.5 to 24.9 is normal.  A BMI of 25 to 29.9 is considered overweight.  A BMI of 30 and above is considered obese.  Maintain normal blood lipids and cholesterol levels by exercising and minimizing your intake of saturated fat. Eat a balanced diet with plenty of fruit and vegetables. Blood tests for lipids and cholesterol should begin at age 3 and be repeated every 5 years. If your lipid or cholesterol levels are high, you are over 50, or you are at high risk for heart disease, you may need your cholesterol levels checked more frequently.Ongoing high lipid and cholesterol levels should be treated with medicines if diet and exercise are not effective.  If you smoke, find out from your caregiver how to quit. If you do not use tobacco, do not start.  If you are pregnant, do not drink alcohol. If you are breastfeeding, be very cautious about drinking alcohol. If you are not pregnant and choose to drink alcohol, do not exceed 1 drink per day. One drink is considered to be 12 ounces (355 mL) of  beer, 5 ounces (148 mL) of wine, or 1.5 ounces (44 mL) of liquor.  Avoid use of street drugs. Do not share needles with anyone. Ask for help if you need support or instructions about stopping the use of drugs.  High blood pressure causes heart disease and increases the risk of stroke. Your blood pressure should be checked at least every 1 to 2 years. Ongoing high blood pressure should be treated with medicines if weight loss and exercise are not effective.  If you are  32 to 61 years old, ask your caregiver if you should take aspirin to prevent strokes.  Diabetes screening involves taking a blood sample to check your fasting blood sugar level. This should be done once every 3 years, after age 40, if you are within normal weight and without risk factors for diabetes. Testing should be considered at a younger age or be carried out more frequently if you are overweight and have at least 1 risk factor for diabetes.  Breast cancer screening is essential preventive care for women. You should practice "breast self-awareness." This means understanding the normal appearance and feel of your breasts and may include breast self-examination. Any changes detected, no matter how small, should be reported to a caregiver. Women in their 21s and 30s should have a clinical breast exam (CBE) by a caregiver as part of a regular health exam every 1 to 3 years. After age 5, women should have a CBE every year. Starting at age 37, women should consider having a mammography (breast X-ray test) every year. Women who have a family history of breast cancer should talk to their caregiver about genetic screening. Women at a high risk of breast cancer should talk to their caregivers about having magnetic resonance imaging (MRI) and a mammography every year.  The Pap test is a screening test for cervical cancer. A Pap test can show cell changes on the cervix that might become cervical cancer if left untreated. A Pap test is a procedure in which cells are obtained and examined from the lower end of the uterus (cervix).  Women should have a Pap test starting at age 61.  Between ages 1 and 21, Pap tests should be repeated every 2 years.  Beginning at age 17, you should have a Pap test every 3 years as long as the past 3 Pap tests have been normal.  Some women have medical problems that increase the chance of getting cervical cancer. Talk to your caregiver about these problems. It is especially  important to talk to your caregiver if a new problem develops soon after your last Pap test. In these cases, your caregiver may recommend more frequent screening and Pap tests.  The above recommendations are the same for women who have or have not gotten the vaccine for human papillomavirus (HPV).  If you had a hysterectomy for a problem that was not cancer or a condition that could lead to cancer, then you no longer need Pap tests. Even if you no longer need a Pap test, a regular exam is a good idea to make sure no other problems are starting.  If you are between ages 80 and 59, and you have had normal Pap tests going back 10 years, you no longer need Pap tests. Even if you no longer need a Pap test, a regular exam is a good idea to make sure no other problems are starting.  If you have had past treatment for cervical cancer or a condition that  could lead to cancer, you need Pap tests and screening for cancer for at least 20 years after your treatment.  If Pap tests have been discontinued, risk factors (such as a new sexual partner) need to be reassessed to determine if screening should be resumed.  The HPV test is an additional test that may be used for cervical cancer screening. The HPV test looks for the virus that can cause the cell changes on the cervix. The cells collected during the Pap test can be tested for HPV. The HPV test could be used to screen women aged 18 years and older, and should be used in women of any age who have unclear Pap test results. After the age of 65, women should have HPV testing at the same frequency as a Pap test.  Colorectal cancer can be detected and often prevented. Most routine colorectal cancer screening begins at the age of 77 and continues through age 35. However, your caregiver may recommend screening at an earlier age if you have risk factors for colon cancer. On a yearly basis, your caregiver may provide home test kits to check for hidden blood in the stool.  Use of a small camera at the end of a tube, to directly examine the colon (sigmoidoscopy or colonoscopy), can detect the earliest forms of colorectal cancer. Talk to your caregiver about this at age 38, when routine screening begins. Direct examination of the colon should be repeated every 5 to 10 years through age 35, unless early forms of pre-cancerous polyps or small growths are found.  Hepatitis C blood testing is recommended for all people born from [birth year] through 1965 and any individual with known risks for hepatitis C.  Practice safe sex. Use condoms and avoid high-risk sexual practices to reduce the spread of sexually transmitted infections (STIs). STIs include gonorrhea, chlamydia, syphilis, trichomonas, herpes, HPV, and human immunodeficiency virus (HIV). Herpes, HIV, and HPV are viral illnesses that have no cure. They can result in disability, cancer, and death. Sexually active women aged 36 and younger should be checked for chlamydia. Older women with new or multiple partners should also be tested for chlamydia. Testing for other STIs is recommended if you are sexually active and at increased risk.  Osteoporosis is a disease in which the bones lose minerals and strength with aging. This can result in serious bone fractures. The risk of osteoporosis can be identified using a bone density scan. Women ages 80 and over and women at risk for fractures or osteoporosis should discuss screening with their caregivers. Ask your caregiver whether you should take a calcium supplement or vitamin D to reduce the rate of osteoporosis.  Menopause can be associated with physical symptoms and risks. Hormone replacement therapy is available to decrease symptoms and risks. You should talk to your caregiver about whether hormone replacement therapy is right for you.  Use sunscreen with sun protection factor (SPF) of 30 or more. Apply sunscreen liberally and repeatedly throughout the day. You should seek shade when  your shadow is shorter than you. Protect yourself by wearing long sleeves, pants, a wide-brimmed hat, and sunglasses year round, whenever you are outdoors.  Once a month, do a whole body skin exam, using a mirror to look at the skin on your back. Notify your caregiver of new moles, moles that have irregular borders, moles that are larger than a pencil eraser, or moles that have changed in shape or color.  Stay current with required immunizations.  Influenza. You need a  dose every fall (or winter). The composition of the flu vaccine changes each year, so being vaccinated once is not enough.  Pneumococcal polysaccharide. You need 1 to 2 doses if you smoke cigarettes or if you have certain chronic medical conditions. You need 1 dose at age 26 (or older) if you have never been vaccinated.  Tetanus, diphtheria, pertussis (Tdap, Td). Get 1 dose of Tdap vaccine if you are younger than age 69, are over 90 and have contact with an infant, are a Research scientist (physical sciences), are pregnant, or simply want to be protected from whooping cough. After that, you need a Td booster dose every 10 years. Consult your caregiver if you have not had at least 3 tetanus and diphtheria-containing shots sometime in your life or have a deep or dirty wound.  HPV. You need this vaccine if you are a woman age 53 or younger. The vaccine is given in 3 doses over 6 months.  Measles, mumps, rubella (MMR). You need at least 1 dose of MMR if you were born in 1957 or later. You may also need a second dose.  Meningococcal. If you are age 47 to 54 and a first-year college student living in a residence hall, or have one of several medical conditions, you need to get vaccinated against meningococcal disease. You may also need additional booster doses.  Zoster (shingles). If you are age 28 or older, you should get this vaccine.  Varicella (chickenpox). If you have never had chickenpox or you were vaccinated but received only 1 dose, talk to your  caregiver to find out if you need this vaccine.  Hepatitis A. You need this vaccine if you have a specific risk factor for hepatitis A virus infection or you simply wish to be protected from this disease. The vaccine is usually given as 2 doses, 6 to 18 months apart.  Hepatitis B. You need this vaccine if you have a specific risk factor for hepatitis B virus infection or you simply wish to be protected from this disease. The vaccine is given in 3 doses, usually over 6 months. Preventive Services / Frequency Ages 48 to 30  Blood pressure check.** / Every 1 to 2 years.  Lipid and cholesterol check.** / Every 5 years beginning at age 18.  Clinical breast exam.** / Every 3 years for women in their 91s and 30s.  Pap test.** / Every 2 years from ages 78 through 84. Every 3 years starting at age 84 through age 63 or 41 with a history of 3 consecutive normal Pap tests.  HPV screening.** / Every 3 years from ages 20 through ages 53 to 56 with a history of 3 consecutive normal Pap tests.  Hepatitis C blood test.** / For any individual with known risks for hepatitis C.  Skin self-exam. / Monthly.  Influenza immunization.** / Every year.  Pneumococcal polysaccharide immunization.** / 1 to 2 doses if you smoke cigarettes or if you have certain chronic medical conditions.  Tetanus, diphtheria, pertussis (Tdap, Td) immunization. / A one-time dose of Tdap vaccine. After that, you need a Td booster dose every 10 years.  HPV immunization. / 3 doses over 6 months, if you are 13 and younger.  Measles, mumps, rubella (MMR) immunization. / You need at least 1 dose of MMR if you were born in 1957 or later. You may also need a second dose.  Meningococcal immunization. / 1 dose if you are age 57 to 29 and a first-year college student living in a  residence hall, or have one of several medical conditions, you need to get vaccinated against meningococcal disease. You may also need additional booster  doses.  Varicella immunization.** / Consult your caregiver.  Hepatitis A immunization.** / Consult your caregiver. 2 doses, 6 to 18 months apart.  Hepatitis B immunization.** / Consult your caregiver. 3 doses usually over 6 months. Ages 30 to 33  Blood pressure check.** / Every 1 to 2 years.  Lipid and cholesterol check.** / Every 5 years beginning at age 73.  Clinical breast exam.** / Every year after age 22.  Mammogram.** / Every year beginning at age 59 and continuing for as long as you are in good health. Consult with your caregiver.  Pap test.** / Every 3 years starting at age 35 through age 64 or 77 with a history of 3 consecutive normal Pap tests.  HPV screening.** / Every 3 years from ages 11 through ages 14 to 80 with a history of 3 consecutive normal Pap tests.  Fecal occult blood test (FOBT) of stool. / Every year beginning at age 32 and continuing until age 86. You may not need to do this test if you get a colonoscopy every 10 years.  Flexible sigmoidoscopy or colonoscopy.** / Every 5 years for a flexible sigmoidoscopy or every 10 years for a colonoscopy beginning at age 49 and continuing until age 57.  Hepatitis C blood test.** / For all people born from [birth year] through 1965 and any individual with known risks for hepatitis C.  Skin self-exam. / Monthly.  Influenza immunization.** / Every year.  Pneumococcal polysaccharide immunization.** / 1 to 2 doses if you smoke cigarettes or if you have certain chronic medical conditions.  Tetanus, diphtheria, pertussis (Tdap, Td) immunization.** / A one-time dose of Tdap vaccine. After that, you need a Td booster dose every 10 years.  Measles, mumps, rubella (MMR) immunization. / You need at least 1 dose of MMR if you were born in 1957 or later. You may also need a second dose.  Varicella immunization.** / Consult your caregiver.  Meningococcal immunization.** / Consult your caregiver.  Hepatitis A immunization.** / Consult  your caregiver. 2 doses, 6 to 18 months apart.  Hepatitis B immunization.** / Consult your caregiver. 3 doses, usually over 6 months. Ages 29 and over  Blood pressure check.** / Every 1 to 2 years.  Lipid and cholesterol check.** / Every 5 years beginning at age 50.  Clinical breast exam.** / Every year after age 40.  Mammogram.** / Every year beginning at age 65 and continuing for as long as you are in good health. Consult with your caregiver.  Pap test.** / Every 3 years starting at age 59 through age 51 or 4 with a 3 consecutive normal Pap tests. Testing can be stopped between 65 and 70 with 3 consecutive normal Pap tests and no abnormal Pap or HPV tests in the past 10 years.  HPV screening.** / Every 3 years from ages 15 through ages 72 or 39 with a history of 3 consecutive normal Pap tests. Testing can be stopped between 65 and 70 with 3 consecutive normal Pap tests and no abnormal Pap or HPV tests in the past 10 years.  Fecal occult blood test (FOBT) of stool. / Every year beginning at age 37 and continuing until age 27. You may not need to do this test if you get a colonoscopy every 10 years.  Flexible sigmoidoscopy or colonoscopy.** / Every 5 years for a flexible sigmoidoscopy or  every 10 years for a colonoscopy beginning at age 16 and continuing until age 84.  Hepatitis C blood test.** / For all people born from [birth year] through 1965 and any individual with known risks for hepatitis C.  Osteoporosis screening.** / A one-time screening for women ages 4 and over and women at risk for fractures or osteoporosis.  Skin self-exam. / Monthly.  Influenza immunization.** / Every year.  Pneumococcal polysaccharide immunization.** / 1 dose at age 79 (or older) if you have never been vaccinated.  Tetanus, diphtheria, pertussis (Tdap, Td) immunization. / A one-time dose of Tdap vaccine if you are over 65 and have contact with an infant, are a Research scientist (physical sciences), or simply want to be protected  from whooping cough. After that, you need a Td booster dose every 10 years.  Varicella immunization.** / Consult your caregiver.  Meningococcal immunization.** / Consult your caregiver.  Hepatitis A immunization.** / Consult your caregiver. 2 doses, 6 to 18 months apart.  Hepatitis B immunization.** / Check with your caregiver. 3 doses, usually over 6 months. ** Family history and personal history of risk and conditions may change your caregiver's recommendations. Document Released: 03/10/2001 Document Revised: 04/06/2011 Document Reviewed: 06/09/2010 [facility] Patient Information 2013 [place], [place].

## 2012-01-25 NOTE — Progress Notes (Signed)
Chief Complaint  Patient presents with  . Annual Exam    Complains of feeling "swimmy" headed and hands tingling.  Ongoing for 1 month.    HPI: Patient comes in today for Preventive Health Care visit  No major change in health status since last visit . Had myalgias with lipitor.   Now has had tingling in all extremities at timew without weakness or pain    Having ocass swimmy feeling with moving head and changing postion but no cp sob racing and no syncope.  Very active  Seems to happen after eating mostly ROS:  GEN/ HEENT: No fever, significant weight changes sweats headaches vision problems hearing changes, CV/ PULM; No chest pain shortness of breath cough, syncope,edema  change in exercise tolerance. GI /GU: No adominal pain, vomiting, change in bowel habits. No blood in the stool. No significant GU symptoms. SKIN/HEME: ,no acute skin rashes suspicious lesions or bleeding. No lymphadenopathy, nodules, masses.  NEURO/ PSYCH:  No neurologic signs such as weakness still on med for rls. No depression anxiety. IMM/ Allergy: No unusual infections.  Allergy .   REST of 12 system review negative except as per HPI   Past Medical History  Diagnosis Date  . IBS (irritable bowel syndrome)   . Thyroid goiter     I 131 rx and then  total throidectomy 2005 [facility]  . GERD (gastroesophageal reflux disease)   . Dyspnea     nl PFTs, and echo  . Recurrent oral ulcers   . Heart murmur   . Asthma   . Headache   . History of kidney stones   . Nephrolithiasis     Family History  Problem Relation Age of Onset  . Cancer Mother     bladder  . COPD Sister   . Coronary artery disease Sister     age 84 also copd  . Arthritis Other   . Cancer Other     breast  . Diabetes Other   . Hyperlipidemia Other   . Hypertension Other   . Stroke Other   . Heart disease Other   . Coronary artery disease Father     also had AAA  . Aneurysm Father     History   Social History  . Marital Status:  Married    Spouse Name: N/A    Number of Children: N/A  . Years of Education: N/A   Social History Main Topics  . Smoking status: Never Smoker   . Smokeless tobacco: Never Used  . Alcohol Use: Yes     Comment: seldom  . Drug Use: None  . Sexually Active: None   Other Topics Concern  . None   Social History Narrative   MarriedHas grandchildrenNever smoked [id number] of 2  care of GC ages 2 - 3.5 5 days a week    Outpatient Encounter Prescriptions as of 01/25/2012  Medication Sig Dispense Refill  . albuterol (PROVENTIL HFA;VENTOLIN HFA) 108 (90 BASE) MCG/ACT inhaler Inhale 2 puffs into the lungs every 6 (six) hours as needed for wheezing.  1 Inhaler  2  . Naproxen Sodium (ALEVE) 220 MG CAPS Take by mouth.        . pantoprazole (PROTONIX) 40 MG tablet Take 1 tablet (40 mg total) by mouth daily.  90 tablet  2  . rOPINIRole (REQUIP) 0.5 MG tablet Take 1 tablet (0.5 mg total) by mouth 4 (four) times daily. Or as directed  360 tablet  2  . SYNTHROID 88 MCG tablet Take  1 tablet (88 mcg total) by mouth daily.  90 tablet  0  . aspirin 81 MG tablet Take 81 mg by mouth daily.        . pravastatin (PRAVACHOL) 20 MG tablet Take 1 tablet (20 mg total) by mouth daily.  30 tablet  5  . [DISCONTINUED] SYNTHROID 88 MCG tablet TAKE 1 TABLET BY MOUTH EVERY DAY  30 tablet  2    EXAM:  BP 124/82  Pulse 80  Temp 98.2 F (36.8 C)  Ht 5' 0.25" (1.53 m)  Wt 162 lb (73.483 kg)  BMI 31.38 kg/m2  SpO2 97%  Body mass index is 31.38 kg/(m^2).  Physical Exam: Vital signs reviewed YNW:GNFA is a well-developed well-nourished alert cooperative   female who appears her stated age in no acute distress.  HEENT: normocephalic atraumatic , Eyes: PERRL EOM's full, conjunctiva clear, Nares: paten,t no deformity discharge or tenderness., Ears: no deformity EAC's clear TMs with normal landmarks. Mouth: clear OP, no lesions, edema.  Moist mucous membranes. Dentition in adequate repair. NECK: supple without masses,  thyromegaly or bruits. CHEST/PULM:  Clear to auscultation and percussion breath sounds equal no wheeze , rales or rhonchi. No chest wall deformities or tenderness. CV: PMI is nondisplaced, S1 S2 no gallops, murmurs, rubs. Peripheral pulses are full without delay.No JVD .  ABDOMEN: Bowel sounds normal nontender  No guard or rebound, no hepato splenomegal no CVA tenderness.  No hernia. Extremtities:  No clubbing cyanosis or edema, no acute joint swelling or redness no focal atrophy NEURO:  Oriented x3, cranial nerves 3-12 appear to be intact, no obvious focal weakness,gait within normal limits no abnormal reflexes or asymmetrical SKIN: No acute rashes normal turgor, color, no bruising or petechiae. PSYCH: Oriented, good eye contact, no obvious depression anxiety, cognition and judgment appear normal. LN: no cervical axillary inguinal adenopathy  Lab Results  Component Value Date   WBC 4.5 12/22/2011   HGB 13.8 12/22/2011   HCT 41.3 12/22/2011   PLT 255.0 12/22/2011   GLUCOSE 100* 12/22/2011   CHOL 240* 12/22/2011   TRIG 221.0* 12/22/2011   HDL 43.90 12/22/2011   LDLDIRECT 182.8 12/22/2011   LDLCALC 152* 10/28/2010   ALT 24 12/22/2011   AST 23 12/22/2011   NA 138 12/22/2011   K 4.1 12/22/2011   CL 103 12/22/2011   CREATININE 0.7 12/22/2011   BUN 16 12/22/2011   CO2 28 12/22/2011   TSH 1.00 12/22/2011   INR 0.97 10/27/2010   HGBA1C 5.7* 10/27/2010    ASSESSMENT AND PLAN:  Discussed the following assessment and plan:  1. Visit for preventive health examination    declines flu vaccine  otherwise UTD   2. Tingling in extremities    vague nl exam follow if progressive and persistent sees Neuro  get b12 level  etc follow for diabetes   3. HYPOTHYROIDISM   4. HYPERLIPIDEMIA NEC/NOS    hx of se of lipitor try pravachol  and fu   5. SYNDROME, RESTLESS LEGS   6. HYPERGLYCEMIA    poss pre diabetes  LSI and monitor  7. Family history of premature coronary heart disease    Declined  flu shot Patient Care Team: Madelin Headings, MD as PCP - General Riyaz Jinnah as Referring Physician (Orthopedic Surgery) Patient Instructions   Intensify lifestyle interventions. Check into the shingles vaccine coverage and you can get this at any time. Try pravachol  Less likely to cause se than the lipitor   Plan repeat lipids and  hg a1c B12 level in about   2 months and then rov  and see how  This does Avoid simple sugars and sweets simple carbs and see how this does.  You probably have prediabetes and have a tendency to have her blood sugar swings  after you eat simple carbohydrates. This possible to make you feel bad.  We'll review your record to check for the neurology evaluation  Preventive Care for Adults, Female A healthy lifestyle and preventive care can promote health and wellness. Preventive health guidelines for women include the following key practices.  A routine yearly physical is a good way to check with your caregiver about your health and preventive screening. It is a chance to share any concerns and updates on your health, and to receive a thorough exam.  Visit your dentist for a routine exam and preventive care every 6 months. Brush your teeth twice a day and floss once a day. Good oral hygiene prevents tooth decay and gum disease.  The frequency of eye exams is based on your age, health, family medical history, use of contact lenses, and other factors. Follow your caregiver's recommendations for frequency of eye exams.  Eat a healthy diet. Foods like vegetables, fruits, whole grains, low-fat dairy products, and lean protein foods contain the nutrients you need without too many calories. Decrease your intake of foods high in solid fats, added sugars, and salt. Eat the right amount of calories for you.Get information about a proper diet from your caregiver, if necessary.  Regular physical exercise is one of the most important things you can do for your health. Most  adults should get at least 150 minutes of moderate-intensity exercise (any activity that increases your heart rate and causes you to sweat) each week. In addition, most adults need muscle-strengthening exercises on 2 or more days a week.  Maintain a healthy weight. The body mass index (BMI) is a screening tool to identify possible weight problems. It provides an estimate of body fat based on height and weight. Your caregiver can help determine your BMI, and can help you achieve or maintain a healthy weight.For adults 20 years and older:  A BMI below 18.5 is considered underweight.  A BMI of 18.5 to 24.9 is normal.  A BMI of 25 to 29.9 is considered overweight.  A BMI of 30 and above is considered obese.  Maintain normal blood lipids and cholesterol levels by exercising and minimizing your intake of saturated fat. Eat a balanced diet with plenty of fruit and vegetables. Blood tests for lipids and cholesterol should begin at age 2 and be repeated every 5 years. If your lipid or cholesterol levels are high, you are over 50, or you are at high risk for heart disease, you may need your cholesterol levels checked more frequently.Ongoing high lipid and cholesterol levels should be treated with medicines if diet and exercise are not effective.  If you smoke, find out from your caregiver how to quit. If you do not use tobacco, do not start.  If you are pregnant, do not drink alcohol. If you are breastfeeding, be very cautious about drinking alcohol. If you are not pregnant and choose to drink alcohol, do not exceed 1 drink per day. One drink is considered to be 12 ounces (355 mL) of beer, 5 ounces (148 mL) of wine, or 1.5 ounces (44 mL) of liquor.  Avoid use of street drugs. Do not share needles with anyone. Ask for help if you need support or  instructions about stopping the use of drugs.  High blood pressure causes heart disease and increases the risk of stroke. Your blood pressure should be checked  at least every 1 to 2 years. Ongoing high blood pressure should be treated with medicines if weight loss and exercise are not effective.  If you are 53 to 61 years old, ask your caregiver if you should take aspirin to prevent strokes.  Diabetes screening involves taking a blood sample to check your fasting blood sugar level. This should be done once every 3 years, after age 21, if you are within normal weight and without risk factors for diabetes. Testing should be considered at a younger age or be carried out more frequently if you are overweight and have at least 1 risk factor for diabetes.  Breast cancer screening is essential preventive care for women. You should practice "breast self-awareness." This means understanding the normal appearance and feel of your breasts and may include breast self-examination. Any changes detected, no matter how small, should be reported to a caregiver. Women in their 73s and 30s should have a clinical breast exam (CBE) by a caregiver as part of a regular health exam every 1 to 3 years. After age 31, women should have a CBE every year. Starting at age 17, women should consider having a mammography (breast X-ray test) every year. Women who have a family history of breast cancer should talk to their caregiver about genetic screening. Women at a high risk of breast cancer should talk to their caregivers about having magnetic resonance imaging (MRI) and a mammography every year.  The Pap test is a screening test for cervical cancer. A Pap test can show cell changes on the cervix that might become cervical cancer if left untreated. A Pap test is a procedure in which cells are obtained and examined from the lower end of the uterus (cervix).  Women should have a Pap test starting at age 53.  Between ages 28 and 20, Pap tests should be repeated every 2 years.  Beginning at age 8, you should have a Pap test every 3 years as long as the past 3 Pap tests have been  normal.  Some women have medical problems that increase the chance of getting cervical cancer. Talk to your caregiver about these problems. It is especially important to talk to your caregiver if a new problem develops soon after your last Pap test. In these cases, your caregiver may recommend more frequent screening and Pap tests.  The above recommendations are the same for women who have or have not gotten the vaccine for human papillomavirus (HPV).  If you had a hysterectomy for a problem that was not cancer or a condition that could lead to cancer, then you no longer need Pap tests. Even if you no longer need a Pap test, a regular exam is a good idea to make sure no other problems are starting.  If you are between ages 23 and 72, and you have had normal Pap tests going back 10 years, you no longer need Pap tests. Even if you no longer need a Pap test, a regular exam is a good idea to make sure no other problems are starting.  If you have had past treatment for cervical cancer or a condition that could lead to cancer, you need Pap tests and screening for cancer for at least 20 years after your treatment.  If Pap tests have been discontinued, risk factors (such as a new sexual  partner) need to be reassessed to determine if screening should be resumed.  The HPV test is an additional test that may be used for cervical cancer screening. The HPV test looks for the virus that can cause the cell changes on the cervix. The cells collected during the Pap test can be tested for HPV. The HPV test could be used to screen women aged 66 years and older, and should be used in women of any age who have unclear Pap test results. After the age of 93, women should have HPV testing at the same frequency as a Pap test.  Colorectal cancer can be detected and often prevented. Most routine colorectal cancer screening begins at the age of 37 and continues through age 73. However, your caregiver may recommend screening at  an earlier age if you have risk factors for colon cancer. On a yearly basis, your caregiver may provide home test kits to check for hidden blood in the stool. Use of a small camera at the end of a tube, to directly examine the colon (sigmoidoscopy or colonoscopy), can detect the earliest forms of colorectal cancer. Talk to your caregiver about this at age 51, when routine screening begins. Direct examination of the colon should be repeated every 5 to 10 years through age 94, unless early forms of pre-cancerous polyps or small growths are found.  Hepatitis C blood testing is recommended for all people born from [birth year] through 1965 and any individual with known risks for hepatitis C.  Practice safe sex. Use condoms and avoid high-risk sexual practices to reduce the spread of sexually transmitted infections (STIs). STIs include gonorrhea, chlamydia, syphilis, trichomonas, herpes, HPV, and human immunodeficiency virus (HIV). Herpes, HIV, and HPV are viral illnesses that have no cure. They can result in disability, cancer, and death. Sexually active women aged 24 and younger should be checked for chlamydia. Older women with new or multiple partners should also be tested for chlamydia. Testing for other STIs is recommended if you are sexually active and at increased risk.  Osteoporosis is a disease in which the bones lose minerals and strength with aging. This can result in serious bone fractures. The risk of osteoporosis can be identified using a bone density scan. Women ages 26 and over and women at risk for fractures or osteoporosis should discuss screening with their caregivers. Ask your caregiver whether you should take a calcium supplement or vitamin D to reduce the rate of osteoporosis.  Menopause can be associated with physical symptoms and risks. Hormone replacement therapy is available to decrease symptoms and risks. You should talk to your caregiver about whether hormone replacement therapy is right for  you.  Use sunscreen with sun protection factor (SPF) of 30 or more. Apply sunscreen liberally and repeatedly throughout the day. You should seek shade when your shadow is shorter than you. Protect yourself by wearing long sleeves, pants, a wide-brimmed hat, and sunglasses year round, whenever you are outdoors.  Once a month, do a whole body skin exam, using a mirror to look at the skin on your back. Notify your caregiver of new moles, moles that have irregular borders, moles that are larger than a pencil eraser, or moles that have changed in shape or color.  Stay current with required immunizations.  Influenza. You need a dose every fall (or winter). The composition of the flu vaccine changes each year, so being vaccinated once is not enough.  Pneumococcal polysaccharide. You need 1 to 2 doses if you smoke cigarettes  or if you have certain chronic medical conditions. You need 1 dose at age 8 (or older) if you have never been vaccinated.  Tetanus, diphtheria, pertussis (Tdap, Td). Get 1 dose of Tdap vaccine if you are younger than age 95, are over 31 and have contact with an infant, are a Research scientist (physical sciences), are pregnant, or simply want to be protected from whooping cough. After that, you need a Td booster dose every 10 years. Consult your caregiver if you have not had at least 3 tetanus and diphtheria-containing shots sometime in your life or have a deep or dirty wound.  HPV. You need this vaccine if you are a woman age 50 or younger. The vaccine is given in 3 doses over 6 months.  Measles, mumps, rubella (MMR). You need at least 1 dose of MMR if you were born in 1957 or later. You may also need a second dose.  Meningococcal. If you are age 26 to 80 and a first-year college student living in a residence hall, or have one of several medical conditions, you need to get vaccinated against meningococcal disease. You may also need additional booster doses.  Zoster (shingles). If you are age 61 or  older, you should get this vaccine.  Varicella (chickenpox). If you have never had chickenpox or you were vaccinated but received only 1 dose, talk to your caregiver to find out if you need this vaccine.  Hepatitis A. You need this vaccine if you have a specific risk factor for hepatitis A virus infection or you simply wish to be protected from this disease. The vaccine is usually given as 2 doses, 6 to 18 months apart.  Hepatitis B. You need this vaccine if you have a specific risk factor for hepatitis B virus infection or you simply wish to be protected from this disease. The vaccine is given in 3 doses, usually over 6 months. Preventive Services / Frequency Ages 52 to 61  Blood pressure check.** / Every 1 to 2 years.  Lipid and cholesterol check.** / Every 5 years beginning at age 78.  Clinical breast exam.** / Every 3 years for women in their 57s and 30s.  Pap test.** / Every 2 years from ages 60 through 57. Every 3 years starting at age 48 through age 33 or 17 with a history of 3 consecutive normal Pap tests.  HPV screening.** / Every 3 years from ages 10 through ages 37 to 15 with a history of 3 consecutive normal Pap tests.  Hepatitis C blood test.** / For any individual with known risks for hepatitis C.  Skin self-exam. / Monthly.  Influenza immunization.** / Every year.  Pneumococcal polysaccharide immunization.** / 1 to 2 doses if you smoke cigarettes or if you have certain chronic medical conditions.  Tetanus, diphtheria, pertussis (Tdap, Td) immunization. / A one-time dose of Tdap vaccine. After that, you need a Td booster dose every 10 years.  HPV immunization. / 3 doses over 6 months, if you are 22 and younger.  Measles, mumps, rubella (MMR) immunization. / You need at least 1 dose of MMR if you were born in 1957 or later. You may also need a second dose.  Meningococcal immunization. / 1 dose if you are age 74 to 62 and a first-year college student living in a  residence hall, or have one of several medical conditions, you need to get vaccinated against meningococcal disease. You may also need additional booster doses.  Varicella immunization.** / Consult your caregiver.  Hepatitis  A immunization.** / Consult your caregiver. 2 doses, 6 to 18 months apart.  Hepatitis B immunization.** / Consult your caregiver. 3 doses usually over 6 months. Ages 85 to 41  Blood pressure check.** / Every 1 to 2 years.  Lipid and cholesterol check.** / Every 5 years beginning at age 24.  Clinical breast exam.** / Every year after age 42.  Mammogram.** / Every year beginning at age 60 and continuing for as long as you are in good health. Consult with your caregiver.  Pap test.** / Every 3 years starting at age 7 through age 33 or 3 with a history of 3 consecutive normal Pap tests.  HPV screening.** / Every 3 years from ages 4 through ages 71 to 55 with a history of 3 consecutive normal Pap tests.  Fecal occult blood test (FOBT) of stool. / Every year beginning at age 68 and continuing until age 47. You may not need to do this test if you get a colonoscopy every 10 years.  Flexible sigmoidoscopy or colonoscopy.** / Every 5 years for a flexible sigmoidoscopy or every 10 years for a colonoscopy beginning at age 45 and continuing until age 34.  Hepatitis C blood test.** / For all people born from [birth year] through 1965 and any individual with known risks for hepatitis C.  Skin self-exam. / Monthly.  Influenza immunization.** / Every year.  Pneumococcal polysaccharide immunization.** / 1 to 2 doses if you smoke cigarettes or if you have certain chronic medical conditions.  Tetanus, diphtheria, pertussis (Tdap, Td) immunization.** / A one-time dose of Tdap vaccine. After that, you need a Td booster dose every 10 years.  Measles, mumps, rubella (MMR) immunization. / You need at least 1 dose of MMR if you were born in 1957 or later. You may also need a second  dose.  Varicella immunization.** / Consult your caregiver.  Meningococcal immunization.** / Consult your caregiver.  Hepatitis A immunization.** / Consult your caregiver. 2 doses, 6 to 18 months apart.  Hepatitis B immunization.** / Consult your caregiver. 3 doses, usually over 6 months. Ages 27 and over  Blood pressure check.** / Every 1 to 2 years.  Lipid and cholesterol check.** / Every 5 years beginning at age 75.  Clinical breast exam.** / Every year after age 15.  Mammogram.** / Every year beginning at age 76 and continuing for as long as you are in good health. Consult with your caregiver.  Pap test.** / Every 3 years starting at age 20 through age 39 or 60 with a 3 consecutive normal Pap tests. Testing can be stopped between 65 and 70 with 3 consecutive normal Pap tests and no abnormal Pap or HPV tests in the past 10 years.  HPV screening.** / Every 3 years from ages 75 through ages 67 or 33 with a history of 3 consecutive normal Pap tests. Testing can be stopped between 65 and 70 with 3 consecutive normal Pap tests and no abnormal Pap or HPV tests in the past 10 years.  Fecal occult blood test (FOBT) of stool. / Every year beginning at age 58 and continuing until age 71. You may not need to do this test if you get a colonoscopy every 10 years.  Flexible sigmoidoscopy or colonoscopy.** / Every 5 years for a flexible sigmoidoscopy or every 10 years for a colonoscopy beginning at age 69 and continuing until age 20.  Hepatitis C blood test.** / For all people born from [birth year] through 1965 and any individual with known  risks for hepatitis C.  Osteoporosis screening.** / A one-time screening for women ages 13 and over and women at risk for fractures or osteoporosis.  Skin self-exam. / Monthly.  Influenza immunization.** / Every year.  Pneumococcal polysaccharide immunization.** / 1 dose at age 40 (or older) if you have never been vaccinated.  Tetanus, diphtheria, pertussis  (Tdap, Td) immunization. / A one-time dose of Tdap vaccine if you are over 65 and have contact with an infant, are a Research scientist (physical sciences), or simply want to be protected from whooping cough. After that, you need a Td booster dose every 10 years.  Varicella immunization.** / Consult your caregiver.  Meningococcal immunization.** / Consult your caregiver.  Hepatitis A immunization.** / Consult your caregiver. 2 doses, 6 to 18 months apart.  Hepatitis B immunization.** / Check with your caregiver. 3 doses, usually over 6 months. ** Family history and personal history of risk and conditions may change your caregiver's recommendations. Document Released: 03/10/2001 Document Revised: 04/06/2011 Document Reviewed: 06/09/2010 [facility] Patient Information 2013 [place], [place].    reveiwed note neuro early 2013  ; apparent low nl b12 elevated a1c somewhat and rest normal  Felt to have rls and poss statin se .  Neta Mends. Smriti Barkow M.D.  Health Maintenance  Topic Date Due  . Pap Smear  07/19/1968  . Zostavax  07/20/2010  . Influenza Vaccine  09/27/2011  . Tetanus/tdap  11/27/2011  . Mammogram  03/12/2013  . Colonoscopy  06/26/2017   Health Maintenance Review {

## 2012-01-27 ENCOUNTER — Encounter: Payer: Self-pay | Admitting: Internal Medicine

## 2012-01-27 DIAGNOSIS — R202 Paresthesia of skin: Secondary | ICD-10-CM | POA: Insufficient documentation

## 2012-03-25 ENCOUNTER — Other Ambulatory Visit (INDEPENDENT_AMBULATORY_CARE_PROVIDER_SITE_OTHER): Payer: BC Managed Care – PPO

## 2012-03-25 DIAGNOSIS — R7309 Other abnormal glucose: Secondary | ICD-10-CM

## 2012-03-25 DIAGNOSIS — E785 Hyperlipidemia, unspecified: Secondary | ICD-10-CM

## 2012-03-25 DIAGNOSIS — R739 Hyperglycemia, unspecified: Secondary | ICD-10-CM

## 2012-03-25 LAB — HEMOGLOBIN A1C: Hgb A1c MFr Bld: 6 % (ref 4.6–6.5)

## 2012-03-25 LAB — LIPID PANEL
Cholesterol: 238 mg/dL — ABNORMAL HIGH (ref 0–200)
HDL: 40.5 mg/dL (ref >39.00)
Total CHOL/HDL Ratio: 6
Triglycerides: 99 mg/dL (ref 0.0–149.0)
VLDL: 19.8 mg/dL (ref 0.0–40.0)

## 2012-03-28 ENCOUNTER — Ambulatory Visit: Payer: BC Managed Care – PPO | Admitting: Internal Medicine

## 2012-04-01 ENCOUNTER — Ambulatory Visit: Payer: BC Managed Care – PPO | Admitting: Internal Medicine

## 2012-04-28 ENCOUNTER — Other Ambulatory Visit: Payer: Self-pay | Admitting: Internal Medicine

## 2012-04-29 ENCOUNTER — Encounter: Payer: Self-pay | Admitting: Internal Medicine

## 2012-04-29 ENCOUNTER — Ambulatory Visit (INDEPENDENT_AMBULATORY_CARE_PROVIDER_SITE_OTHER): Payer: BC Managed Care – PPO | Admitting: Internal Medicine

## 2012-04-29 VITALS — BP 120/84 | HR 72 | Temp 99.1°F | Wt 164.0 lb

## 2012-04-29 DIAGNOSIS — E785 Hyperlipidemia, unspecified: Secondary | ICD-10-CM

## 2012-04-29 DIAGNOSIS — R7309 Other abnormal glucose: Secondary | ICD-10-CM

## 2012-04-29 DIAGNOSIS — E039 Hypothyroidism, unspecified: Secondary | ICD-10-CM

## 2012-04-29 DIAGNOSIS — R1904 Left lower quadrant abdominal swelling, mass and lump: Secondary | ICD-10-CM | POA: Insufficient documentation

## 2012-04-29 DIAGNOSIS — Z8249 Family history of ischemic heart disease and other diseases of the circulatory system: Secondary | ICD-10-CM

## 2012-04-29 DIAGNOSIS — G2581 Restless legs syndrome: Secondary | ICD-10-CM

## 2012-04-29 DIAGNOSIS — R7303 Prediabetes: Secondary | ICD-10-CM | POA: Insufficient documentation

## 2012-04-29 MED ORDER — PRAVASTATIN SODIUM 40 MG PO TABS: 40.0000 mg | ORAL_TABLET | Freq: Every day | ORAL | Status: DC

## 2012-04-29 MED ORDER — ROPINIROLE HCL 0.5 MG PO TABS: 0.5000 mg | ORAL_TABLET | Freq: Four times a day (QID) | ORAL | Status: DC

## 2012-04-29 MED ORDER — PANTOPRAZOLE SODIUM 40 MG PO TBEC: 40.0000 mg | DELAYED_RELEASE_TABLET | Freq: Every day | ORAL | Status: DC

## 2012-04-29 NOTE — Patient Instructions (Addendum)
Increase pravastatin to 40 mg per day  .  Contact us if having concerns    May need to go up to 80 mg to get   ldl down to reasonable range.   Will refill the other meds for 90 days as we discussed.  I don't really feel a hernia wonder if the symmetry you are seeing is from the gait and knee surgery. I don't feel a mass.   We'll get an opinion from surgery  if you could have a hernia op or if an abdominal scan is appropriate.

## 2012-04-29 NOTE — Progress Notes (Signed)
Chief Complaint  Patient presents with  . Follow-up    Needs a refill of Synthroid, pantoprazole, pravastatin and Requip.  Both need to be 90day supply.    HPI: Patient comes in today for follow up of  multiple medical problems.  Since her last visit no major changes in health status. Lipids no side effects from low dose pravastatin at this time she still on treatment for her restless leg and needs refill of medication. Has a form for work for health screening to be filled out and signed for her and her husband. This is to get a reduction in premium cost.  Wonders if she could have her hernia. She has what looks like swelling in her left lower quadrant without associated pain worsening with cough but just seems to be different. No change in bowel habits increased with cough.  Musculoskeletal getting better walking after knee surgery. She is an occasional blue coloration in her toes without associated pain or numbness. His no new swelling. It's usually when she is up on it for a while.  This is the same leg that is had  trauma and surgery.  GERD stable   ROS: See pertinent positives and negatives per HPI. fam hx of bladder cancer ang AAA father  Past Medical History  Diagnosis Date  . IBS (irritable bowel syndrome)   . Thyroid goiter     I 131 rx and then  total throidectomy 2005 [facility]  . GERD (gastroesophageal reflux disease)   . Dyspnea     nl PFTs, and echo  . Recurrent oral ulcers   . Heart murmur   . Asthma   . Headache   . History of kidney stones   . Nephrolithiasis     Family History  Problem Relation Age of Onset  . Cancer Mother     bladder  . COPD Sister   . Coronary artery disease Sister     age 30 also copd  . Arthritis Other   . Cancer Other     breast  . Diabetes Other   . Hyperlipidemia Other   . Hypertension Other   . Stroke Other   . Heart disease Other   . Coronary artery disease Father     also had AAA  . Aneurysm Father     History    Social History  . Marital Status: Married    Spouse Name: N/A    Number of Children: N/A  . Years of Education: N/A   Social History Main Topics  . Smoking status: Never Smoker   . Smokeless tobacco: Never Used  . Alcohol Use: Yes     Comment: seldom  . Drug Use: None  . Sexually Active: None   Other Topics Concern  . None   Social History Narrative   Married   Has grandchildren   Never smoked    G4P4   hh of 2  care of GC ages 2 - 3.5 5 days a week    Outpatient Encounter Prescriptions as of 04/29/2012  Medication Sig Dispense Refill  . Naproxen Sodium (ALEVE) 220 MG CAPS Take by mouth.        . pravastatin (PRAVACHOL) 40 MG tablet Take 1 tablet (40 mg total) by mouth daily.  90 tablet  3  . SYNTHROID 88 MCG tablet Take 1 tablet (88 mcg total) by mouth daily.  90 tablet  0  . [DISCONTINUED] aspirin 81 MG tablet Take 81 mg by mouth daily.        . [  DISCONTINUED] pantoprazole (PROTONIX) 40 MG tablet Take 1 tablet (40 mg total) by mouth daily.  90 tablet  2  . [DISCONTINUED] pravastatin (PRAVACHOL) 20 MG tablet Take 1 tablet (20 mg total) by mouth daily.  30 tablet  5  . [DISCONTINUED] rOPINIRole (REQUIP) 0.5 MG tablet Take 1 tablet (0.5 mg total) by mouth 4 (four) times daily. Or as directed  360 tablet  2   No facility-administered encounter medications on file as of 04/29/2012.    EXAM:  BP 120/84  Pulse 72  Temp(Src) 99.1 F (37.3 C) (Oral)  Wt 164 lb (74.39 kg)  BMI 31.78 kg/m2  SpO2 98%  Body mass index is 31.78 kg/(m^2). Wt Readings from Last 3 Encounters:  04/29/12 164 lb (74.39 kg)  01/25/12 162 lb (73.483 kg)  04/13/11 167 lb (75.751 kg)    GENERAL: vitals reviewed and listed above, alert, oriented, appears well hydrated and in no acute distress  HEENT: atraumatic, conjunctiva  clear, no obvious abnormalities on inspection of external nose and ears OP : no lesion edema or exudate   NECK: no obvious masses on inspection palpation  No adenopathy    LUNGS: clear to auscultation bilaterally, no wheezes, rales or rhonchi, good air movement  CV: HRRR, no clubbing cyanosis or  peripheral edema nl cap refill  Left le no edmea wellhealed scar  No blue po rulceration  Gait minimally assymetrical  Abdomen:  Sof,t normal bowel sounds without hepatosplenomegaly, no guarding rebound or masses no CVA tenderness Standing more prominent left lower fat pad than right but I don't feel a mass or discrete hernia. Also examined patient laying down. MS: moves all extremities without noticeable focal  abnormality  PSYCH: pleasant and cooperative, no obvious depression or anxiety Lab Results  Component Value Date   WBC 4.5 12/22/2011   HGB 13.8 12/22/2011   HCT 41.3 12/22/2011   PLT 255.0 12/22/2011   GLUCOSE 100* 12/22/2011   CHOL 238* 03/25/2012   TRIG 99.0 03/25/2012   HDL 40.50 03/25/2012   LDLDIRECT 193.9 03/25/2012   LDLCALC 152* 10/28/2010   ALT 24 12/22/2011   AST 23 12/22/2011   NA 138 12/22/2011   K 4.1 12/22/2011   CL 103 12/22/2011   CREATININE 0.7 12/22/2011   BUN 16 12/22/2011   CO2 28 12/22/2011   TSH 1.00 12/22/2011   INR 0.97 10/27/2010   HGBA1C 6.0 03/25/2012    ASSESSMENT AND PLAN:  Discussed the following assessment and plan:  HYPERLIPIDEMIA NEC/NOS - High LDL had side effects other medicines increase pravastatin 40 mg consider increase to 80 if tolerated  Pre-diabetes - Stable at this time continue  lifestyle changes  Family history of premature coronary heart disease  SYNDROME, RESTLESS LEGS  Abdominal swelling, left lower quadrant - ? if  abd wall assymetry dont feel hernia  have surgery check last abd ct 4 y ago  HYPOTHYROIDISM  form  Completed and signed for insurance  -Patient advised to return or notify health care team  if symptoms worsen or persist or new concerns arise.  Patient Instructions  Increase pravastatin to 40 mg per day  .  Contact us if having concerns    May need to go up to 80 mg to get   ldl  down to reasonable range.   Will refill the other meds for 90 days as we discussed.  I don't really feel a hernia wonder if the symmetry you are seeing is from the gait and knee surgery. I don't  feel a mass.   We'll get an opinion from surgery  if you could have a hernia op or if an abdominal scan is appropriate.     Neta Mends. Tameka Hoiland M.D.

## 2012-05-02 ENCOUNTER — Ambulatory Visit (INDEPENDENT_AMBULATORY_CARE_PROVIDER_SITE_OTHER): Payer: BC Managed Care – PPO | Admitting: Internal Medicine

## 2012-05-02 ENCOUNTER — Encounter: Payer: Self-pay | Admitting: Internal Medicine

## 2012-05-02 VITALS — BP 136/90 | HR 102 | Temp 99.7°F | Wt 163.0 lb

## 2012-05-02 DIAGNOSIS — N10 Acute tubulo-interstitial nephritis: Secondary | ICD-10-CM

## 2012-05-02 DIAGNOSIS — R300 Dysuria: Secondary | ICD-10-CM

## 2012-05-02 DIAGNOSIS — M549 Dorsalgia, unspecified: Secondary | ICD-10-CM

## 2012-05-02 DIAGNOSIS — R109 Unspecified abdominal pain: Secondary | ICD-10-CM

## 2012-05-02 DIAGNOSIS — N390 Urinary tract infection, site not specified: Secondary | ICD-10-CM

## 2012-05-02 HISTORY — DX: Acute pyelonephritis: N10

## 2012-05-02 LAB — POCT URINALYSIS DIPSTICK
Bilirubin, UA: NEGATIVE
Glucose, UA: NEGATIVE
Nitrite, UA: POSITIVE
Urobilinogen, UA: 0.2

## 2012-05-02 MED ORDER — CIPROFLOXACIN HCL 500 MG PO TABS: 500.0000 mg | ORAL_TABLET | Freq: Two times a day (BID) | ORAL | Status: DC

## 2012-05-02 MED ORDER — CEFTRIAXONE SODIUM 1 G IJ SOLR
1.0000 g | Freq: Once | INTRAMUSCULAR | Status: AC
  Administered 2012-05-02: 1 g via INTRAMUSCULAR

## 2012-05-02 NOTE — Progress Notes (Signed)
Chief Complaint  Patient presents with  . Back Pain    Started on Saturday.  Took some Aleve last ight for the pain and fever.  . Abdominal Pain  . Dysuria  . Fever  . Chills    HPI: Patient comes in today for SDA for  new problem evaluation. Frequency and urgency 2 days ago with chills and nausea.  101.  Took aleve yesterday;  Left back pain  No vomiting dec po intake  No hematuria seen . Called urology office  ( husband had prostate cancer and mom had bladder cancer ) but they couldn't see her right away . Concern she could have a stone/ per patient.  Has suprapubic plain and left back pain and  Small volume  Urine but urency .  No hx of  Pyelo but does have hx of stones.  ROS: See pertinent positives and negatives per HPI.  Past Medical History  Diagnosis Date  . IBS (irritable bowel syndrome)   . Thyroid goiter     I 131 rx and then  total throidectomy 2005 [facility]  . GERD (gastroesophageal reflux disease)   . Dyspnea     nl PFTs, and echo  . Recurrent oral ulcers   . Heart murmur   . Asthma   . Headache   . History of kidney stones   . Nephrolithiasis     Family History  Problem Relation Age of Onset  . Cancer Mother     bladder  . COPD Sister   . Coronary artery disease Sister     age 17 also copd  . Arthritis Other   . Cancer Other     breast  . Diabetes Other   . Hyperlipidemia Other   . Hypertension Other   . Stroke Other   . Heart disease Other   . Coronary artery disease Father     also had AAA  . Aneurysm Father     History   Social History  . Marital Status: Married    Spouse Name: N/A    Number of Children: N/A  . Years of Education: N/A   Social History Main Topics  . Smoking status: Never Smoker   . Smokeless tobacco: Never Used  . Alcohol Use: Yes     Comment: seldom  . Drug Use: None  . Sexually Active: None   Other Topics Concern  . None   Social History Narrative   Married   Has grandchildren   Never smoked    G4P4   hh  of 2  care of GC ages 2 - 3.5 5 days a week    Outpatient Encounter Prescriptions as of 05/02/2012  Medication Sig Dispense Refill  . Naproxen Sodium (ALEVE) 220 MG CAPS Take by mouth.        . pantoprazole (PROTONIX) 40 MG tablet Take 1 tablet (40 mg total) by mouth daily.  90 tablet  3  . pravastatin (PRAVACHOL) 40 MG tablet Take 1 tablet (40 mg total) by mouth daily.  90 tablet  3  . rOPINIRole (REQUIP) 0.5 MG tablet Take 1 tablet (0.5 mg total) by mouth 4 (four) times daily. Or as directed  360 tablet  3  . SYNTHROID 88 MCG tablet Take 1 tablet (88 mcg total) by mouth daily.  90 tablet  0  . SYNTHROID 88 MCG tablet TAKE 1 TABLET BY MOUTH ONCE DAILY  90 tablet  1  . ciprofloxacin (CIPRO) 500 MG tablet Take 1 tablet (500 mg  total) by mouth 2 (two) times daily.  14 tablet  0   No facility-administered encounter medications on file as of 05/02/2012.    EXAM:  BP 136/90  Pulse 102  Temp(Src) 99.7 F (37.6 C) (Oral)  Wt 163 lb (73.936 kg)  BMI 31.58 kg/m2  SpO2 98%  Body mass index is 31.58 kg/(m^2).  GENERAL: vitals reviewed and listed above, alert, oriented, appears well hydrated and in no acute distress non toxic doesn't feel well   HEENT: atraumatic, conjunctiva  clear, no obvious abnormalities on inspection of external nose and ears OP : no lesion edema or exudate   NECK: no obvious masses on inspection palpation   LUNGS: clear to auscultation bilaterally, no wheezes, rales or rhonchi, good air movement  CV: HRRR, no clubbing cyanosis or  peripheral edema nl cap refill  Abdomen:  Sof,t normal bowel sounds without hepatosplenomegaly, voluntary guard left   No rebound or masses left mild  CVA tenderness. Suprapubic tenderness  Skin nl cap refill and no new rashes  MS: moves all extremities without noticeable focal  abnormality  PSYCH: pleasant and cooperative, no obvious depression or anxiety UA pos nitrites  Pos blood and wbc  ASSESSMENT AND PLAN:  Discussed the following  assessment and plan:  Acute pyelonephritis - based on sx and exam and urine result roceophin 1 gram and orals close fu. will do referral to urio per pt request also.  Dysuria - Plan: POC Urinalysis Dipstick  Abdominal pain, unspecified site - Plan: POC Urinalysis Dipstick  Back pain - Plan: POC Urinalysis Dipstick  UTI (urinary tract infection) - Plan: Urine culture Hx of stones  May need fu imaging study  As appropriate will do referral.  -Patient advised to return or notify health care team  if symptoms worsen or persist or new concerns arise.  Patient Instructions  you have a kidney infection  Antibiotic and fluids    shot today lasts for 24 hours . Begin pill  Antibiotics on this evening or in am .   There is a potential interaction with requip ( elevateds levels) coud decrease dose if needed while on antibiotic .  Recheck  Wednesday o Thursday Will do a referral to urology as  Requested in the meantime .       Neta Mends. Dalary Hollar M.D.

## 2012-05-02 NOTE — Patient Instructions (Addendum)
you have a kidney infection  Antibiotic and fluids    shot today lasts for 24 hours . Begin pill  Antibiotics on this evening or in am .   There is a potential interaction with requip ( elevateds levels) coud decrease dose if needed while on antibiotic .  Recheck  Wednesday o Thursday Will do a referral to urology as  Requested in the meantime .

## 2012-05-02 NOTE — Addendum Note (Signed)
Addended by: Raj Janus T on: 05/02/2012 01:26 PM   Modules accepted: Orders

## 2012-05-04 ENCOUNTER — Ambulatory Visit (INDEPENDENT_AMBULATORY_CARE_PROVIDER_SITE_OTHER): Payer: BC Managed Care – PPO | Admitting: Internal Medicine

## 2012-05-04 ENCOUNTER — Encounter: Payer: Self-pay | Admitting: Internal Medicine

## 2012-05-04 VITALS — BP 132/72 | HR 76 | Temp 97.9°F | Wt 162.0 lb

## 2012-05-04 DIAGNOSIS — N390 Urinary tract infection, site not specified: Secondary | ICD-10-CM

## 2012-05-04 DIAGNOSIS — N10 Acute tubulo-interstitial nephritis: Secondary | ICD-10-CM

## 2012-05-04 LAB — POCT URINALYSIS DIPSTICK
Glucose, UA: NEGATIVE
Nitrite, UA: NEGATIVE
Protein, UA: 1
Urobilinogen, UA: 0.2

## 2012-05-04 NOTE — Patient Instructions (Signed)
Continue medication  Urine is improved . ROV next Monday before run out of med .  Or as needed

## 2012-05-04 NOTE — Progress Notes (Signed)
Chief Complaint  Patient presents with  . Follow-up    HPI: Fu of pyelo   she was seen a few days ago and given Rocephin and Cipro since that time she is at least 30% better she has no more fever and chills back pain is a lot better still has nausea and feels a bit sick. Was able to see the urologist Dr. Brunilda Payor yesterday he did not feel that she had a renal stone involved in this situation. Because she was starting to get better we are space to keep him informed and followup if persistent or progressive problems. At this time the scan is not indicated She did have a little flare up of her restless leg when put on the Cipro   She has her surgery appointment next week with Dr. Derrell Lolling about the rule out hernia asymmetrical abdomen situation. ROS: See pertinent positives and negatives per HPI.  Past Medical History  Diagnosis Date  . IBS (irritable bowel syndrome)   . Thyroid goiter     I 131 rx and then  total throidectomy 2005 [facility]  . GERD (gastroesophageal reflux disease)   . Dyspnea     nl PFTs, and echo  . Recurrent oral ulcers   . Heart murmur   . Asthma   . Headache   . History of kidney stones   . Nephrolithiasis     Family History  Problem Relation Age of Onset  . Cancer Mother     bladder  . COPD Sister   . Coronary artery disease Sister     age 56 also copd  . Arthritis Other   . Cancer Other     breast  . Diabetes Other   . Hyperlipidemia Other   . Hypertension Other   . Stroke Other   . Heart disease Other   . Coronary artery disease Father     also had AAA  . Aneurysm Father     History   Social History  . Marital Status: Married    Spouse Name: N/A    Number of Children: N/A  . Years of Education: N/A   Social History Main Topics  . Smoking status: Never Smoker   . Smokeless tobacco: Never Used  . Alcohol Use: Yes     Comment: seldom  . Drug Use: None  . Sexually Active: None   Other Topics Concern  . None   Social History Narrative   Married   Has grandchildren   Never smoked    G4P4   hh of 2  care of GC ages 2 - 3.5 5 days a week    Outpatient Encounter Prescriptions as of 05/04/2012  Medication Sig Dispense Refill  . ciprofloxacin (CIPRO) 500 MG tablet Take 1 tablet (500 mg total) by mouth 2 (two) times daily.  14 tablet  0  . Naproxen Sodium (ALEVE) 220 MG CAPS Take by mouth.        . pantoprazole (PROTONIX) 40 MG tablet Take 1 tablet (40 mg total) by mouth daily.  90 tablet  3  . pravastatin (PRAVACHOL) 40 MG tablet Take 1 tablet (40 mg total) by mouth daily.  90 tablet  3  . rOPINIRole (REQUIP) 0.5 MG tablet Take 1 tablet (0.5 mg total) by mouth 4 (four) times daily. Or as directed  360 tablet  3  . SYNTHROID 88 MCG tablet TAKE 1 TABLET BY MOUTH ONCE DAILY  90 tablet  1  . [DISCONTINUED] SYNTHROID 88 MCG tablet Take  1 tablet (88 mcg total) by mouth daily.  90 tablet  0   No facility-administered encounter medications on file as of 05/04/2012.    EXAM:  BP 132/72  Pulse 76  Temp(Src) 97.9 F (36.6 C) (Oral)  Wt 162 lb (73.483 kg)  BMI 31.39 kg/m2  SpO2 96%  Body mass index is 31.39 kg/(m^2).  GENERAL: vitals reviewed and listed above, alert, oriented, appears well hydrated and in no acute distress  HEENT: atraumatic, conjunctiva  clear, no obvious abnormalities on inspection of external nose and ears  NECK: no obvious masses on inspection palpation  CV: HRRR, no clubbing cyanosis or  peripheral edema nl cap refill  Abdomen soft without organomegaly guarding or rebound she still has some tenderness in the mid abdomen to lower no significant CVA tenderness MS: moves all extremities without noticeable focal  abnormality Skin has a small faded linear bruise right inguinal area no masses noted PSYCH: pleasant and cooperative, no obvious depression or anxiety  repeat urinalysis shows some leukocytes only trace blood urine culture no results back yet ASSESSMENT AND PLAN:  Discussed the following assessment  and plan:  Pyelonephritis, acute - Improving symptoms follow closely end of medication. - Plan: POC Urinalysis Dipstick  UTI (urinary tract infection) - Plan: Urine culture  -Patient advised to return or notify health care team  if symptoms worsen or persist or new concerns arise.  Patient Instructions  Continue medication  Urine is improved . ROV next Monday before run out of med .  Or as needed   ```````` Neta Mends. Abdoulaye Drum M.D.  Results  nesi  When avaialble  ovc Monday before runs out of meds   prob no underlying although states she may have passes a stone on the weekend  When this was at its.  peak.

## 2012-05-06 LAB — URINE CULTURE
Colony Count: NO GROWTH
Organism ID, Bacteria: NO GROWTH

## 2012-05-09 ENCOUNTER — Encounter (INDEPENDENT_AMBULATORY_CARE_PROVIDER_SITE_OTHER): Payer: Self-pay | Admitting: General Surgery

## 2012-05-09 ENCOUNTER — Ambulatory Visit (INDEPENDENT_AMBULATORY_CARE_PROVIDER_SITE_OTHER): Payer: BC Managed Care – PPO | Admitting: Internal Medicine

## 2012-05-09 ENCOUNTER — Ambulatory Visit (INDEPENDENT_AMBULATORY_CARE_PROVIDER_SITE_OTHER): Payer: BC Managed Care – PPO | Admitting: General Surgery

## 2012-05-09 VITALS — BP 124/74 | HR 85 | Temp 97.4°F | Wt 164.0 lb

## 2012-05-09 VITALS — BP 138/82 | HR 84 | Temp 97.6°F | Resp 18 | Ht 60.0 in | Wt 162.0 lb

## 2012-05-09 DIAGNOSIS — R1032 Left lower quadrant pain: Secondary | ICD-10-CM

## 2012-05-09 DIAGNOSIS — R1904 Left lower quadrant abdominal swelling, mass and lump: Secondary | ICD-10-CM

## 2012-05-09 DIAGNOSIS — N10 Acute tubulo-interstitial nephritis: Secondary | ICD-10-CM

## 2012-05-09 LAB — POCT URINALYSIS DIPSTICK
Bilirubin, UA: NEGATIVE
Blood, UA: NEGATIVE
Glucose, UA: NEGATIVE
Nitrite, UA: NEGATIVE
Spec Grav, UA: 1.025
pH, UA: 7.5

## 2012-05-09 NOTE — Progress Notes (Signed)
Patient ID: Sharmon Revere, female   DOB: Aug 04, 1950, 62 y.o.   MRN: [id number]  Chief Complaint  Patient presents with  . Other    rule out hernia    HPI [name] is a 62 y.o. female.  She is referred by Dr.  Berniece Andreas for evaluation of left lower quadrant pain and swelling. I operated on her husband emergently 10 years ago for intra-abdominal bleeding during a urologic procedure.  The patient states that she has noticed some left lower quadrant swelling for about one year. There has been no imaging studies recently, although a CT scan in 2009 showed no abnormality of the abdominal wall or inguinal areas. She says she has occasional pain, it is not severe, it does not radiate. She states her clothes do not fit as well as the use 2. Her weight has gone from 128 pounds in 2008 to 162 now. This has been gradual. She has no gastrointestinal symptoms.  Past surgical history reveals an open appendectomy through a right lower quadrant incision age 30. Laparoscopic abdominal hysterectomy without oophorectomy. She's had 4 vaginal deliveries, no C-sections. No prior history of hernias.  Comorbidities include a fracture left leg 2008 which put her in a wheelchair for several weeks and a left total knee replacement 2010. She has asthma. She has kidney stones. She is in no distress today  HPI  Past Medical History  Diagnosis Date  . IBS (irritable bowel syndrome)   . Thyroid goiter     I 131 rx and then  total throidectomy 2005 [facility]  . GERD (gastroesophageal reflux disease)   . Dyspnea     nl PFTs, and echo  . Recurrent oral ulcers   . Heart murmur   . Asthma   . Headache   . History of kidney stones   . Nephrolithiasis     Past Surgical History  Procedure Laterality Date  . Total thyroidectomy  2005    for  large nodule 2006  . Abdominal hysterectomy  1994    fibroid  . Appendectomy  1968  . Tonsillectomy  1979  . Orif left tivial plateau fracture      Dr. Leslee Home  2/08  . Plates and pins take out of left knee  06/2008    Family History  Problem Relation Age of Onset  . Cancer Mother     bladder  . COPD Sister   . Coronary artery disease Sister     age 54 also copd  . Arthritis Other   . Cancer Other     breast  . Diabetes Other   . Hyperlipidemia Other   . Hypertension Other   . Stroke Other   . Heart disease Other   . Coronary artery disease Father     also had AAA  . Aneurysm Father     Social History History  Substance Use Topics  . Smoking status: Never Smoker   . Smokeless tobacco: Never Used  . Alcohol Use: Yes     Comment: seldom    Allergies  Allergen Reactions  . Ritalin (Methylphenidate Hcl)     Current Outpatient Prescriptions  Medication Sig Dispense Refill  . ciprofloxacin (CIPRO) 500 MG tablet Take 1 tablet (500 mg total) by mouth 2 (two) times daily.  14 tablet  0  . Naproxen Sodium (ALEVE) 220 MG CAPS Take by mouth.        . pantoprazole (PROTONIX) 40 MG tablet Take 1 tablet (40 mg total) by mouth daily.  90 tablet  3  . pravastatin (PRAVACHOL) 40 MG tablet Take 1 tablet (40 mg total) by mouth daily.  90 tablet  3  . rOPINIRole (REQUIP) 0.5 MG tablet Take 1 tablet (0.5 mg total) by mouth 4 (four) times daily. Or as directed  360 tablet  3  . SYNTHROID 88 MCG tablet TAKE 1 TABLET BY MOUTH ONCE DAILY  90 tablet  1   No current facility-administered medications for this visit.    Review of Systems Review of Systems  Constitutional: Negative for fever, chills and unexpected weight change.  HENT: Negative for hearing loss, congestion, sore throat, trouble swallowing and voice change.   Eyes: Negative for visual disturbance.  Respiratory: Negative for cough and wheezing.   Cardiovascular: Negative for chest pain, palpitations and leg swelling.  Gastrointestinal: Positive for abdominal pain and abdominal distention. Negative for nausea, vomiting, diarrhea, constipation, blood in stool and anal bleeding.    Genitourinary: Negative for hematuria, vaginal bleeding and difficulty urinating.  Musculoskeletal: Negative for arthralgias.  Skin: Negative for rash and wound.  Neurological: Negative for seizures, syncope and headaches.  Hematological: Negative for adenopathy. Does not bruise/bleed easily.  Psychiatric/Behavioral: Negative for confusion.    Blood pressure 138/82, pulse 84, temperature 97.6 F (36.4 C), temperature source Oral, resp. rate 18, height 5' (1.524 m), weight 162 lb (73.483 kg).  Physical Exam Physical Exam  Constitutional: She is oriented to person, place, and time. She appears well-developed and well-nourished. No distress.  HENT:  Head: Normocephalic and atraumatic.  Nose: Nose normal.  Mouth/Throat: No oropharyngeal exudate.  Eyes: Conjunctivae and EOM are normal. Pupils are equal, round, and reactive to light. Left eye exhibits no discharge. No scleral icterus.  Neck: Neck supple. No JVD present. No tracheal deviation present. No thyromegaly present.  Cardiovascular: Normal rate, regular rhythm, normal heart sounds and intact distal pulses.   No murmur heard. Pulmonary/Chest: Effort normal and breath sounds normal. No respiratory distress. She has no wheezes. She has no rales. She exhibits no tenderness.  Abdominal: Soft. Bowel sounds are normal. She exhibits no distension and no mass. There is no tenderness. There is no rebound and no guarding.    She is examined supine and standing. Small panniculus, more prominent on the left. I do not feel any mass in the abdominal wall or intra-abdominal. I do not detect a hernia in the abdominal wall or inguinal area. No spigelian hernia noted. No incisional hernia noted at the umbilicus and right lower quadrant.  Musculoskeletal: She exhibits no edema and no tenderness.  Lymphadenopathy:    She has no cervical adenopathy.  Neurological: She is alert and oriented to person, place, and time. She exhibits normal muscle tone.  Coordination normal.  Skin: Skin is warm. No rash noted. She is not diaphoretic. No erythema. No pallor.  Psychiatric: She has a normal mood and affect. Her behavior is normal. Judgment and thought content normal.    Data Reviewed Office notes from Dr. Fabian Sharp. CT scan 2009.  Assessment    Left lower quadrant abdominal swelling and pain for one year, etiology unclear. Hernia not detected on physical exam today.  Asthma  History of an appendectomy age 27  History laparoscopic hysterectomy without oophorectomy     Plan    I discussed the differential diagnosis with her, including occult hernias, musculoskeletal strain, less likely intra-abdominal pathology. She is very interested in finding out if something is going on and would like to pursue the diagnosis.  She will be  scheduled for CT scan of the abdomen and pelvis with contrast, and will return to see me after that is done for further discussion.        Angelia Mould. Derrell Lolling, M.D., [facility] General and Minimally invasive Surgery Breast and Colorectal Surgery Office:   [phone] Pager:   [phone]  05/09/2012, 10:04 AM

## 2012-05-09 NOTE — Patient Instructions (Signed)
Finish medication.  Contact us if any  Relapsing sx  Fever severe pain.  Etc .  Fatigue should get better  With time.  Will send   Copy of note to dr Brunilda Payor.

## 2012-05-09 NOTE — Patient Instructions (Signed)
Your physical exam today does not confirm a hernia in the left lower quadrant of the abdomen or anywhere else in the abdomen or inguinal areas. There is no immediate indication for surgery.  We have discussed options for management.  you will be scheduled for a CT scan of the abdomen and pelvis with contrast to be sure that we are not missing a subtle hernia or any other intra-abdominal problem.  Return to see Dr. Derrell Lolling in the office after the CT scan is done.

## 2012-05-10 ENCOUNTER — Ambulatory Visit
Admission: RE | Admit: 2012-05-10 | Discharge: 2012-05-10 | Disposition: A | Discharge status: Home / Self Care | Payer: BC Managed Care – PPO | Source: Ambulatory Visit | Attending: General Surgery | Admitting: General Surgery

## 2012-05-10 ENCOUNTER — Telehealth (INDEPENDENT_AMBULATORY_CARE_PROVIDER_SITE_OTHER): Payer: Self-pay

## 2012-05-10 DIAGNOSIS — R1032 Left lower quadrant pain: Secondary | ICD-10-CM

## 2012-05-10 MED ORDER — IOHEXOL 300 MG/ML  SOLN
100.0000 mL | Freq: Once | INTRAMUSCULAR | Status: AC | PRN
  Administered 2012-05-10: 100 mL via INTRAVENOUS

## 2012-05-10 NOTE — Telephone Encounter (Signed)
I left a message for the pt to call me.  I want to give her the results from her CT.  I also need to cancel her follow up appointment.

## 2012-05-10 NOTE — Telephone Encounter (Signed)
Message copied by Ivory Broad on Tue May 10, 2012  3:24 PM ------      Message from: Ernestene Mention      Created: Tue May 10, 2012 11:59 AM       Call radiology reports to patient. No hernia or other abnormALITY SEEN. sUGGEST THIS IS A MUSCULOSKELETAL PAIN. nO SPORTS OR LIFTING for 3-4 weeks. No need for surgical intervention. No ned to see me in office. ------

## 2012-05-10 NOTE — Telephone Encounter (Signed)
The pt called me back.  I gave her the results.  I cancelled her appointment

## 2012-05-11 ENCOUNTER — Encounter: Payer: Self-pay | Admitting: Internal Medicine

## 2012-05-11 NOTE — Progress Notes (Signed)
Chief Complaint  Patient presents with  . Follow-up    Will have a CT scan tomorrow morning.    HPI: Fu pyelonephritis   Is a lot better no pain a bit tired   No dyruia fever.    Has a new grand child up this weekend  Saw dr Derrell Lolling to get abd ct ot check abd area   ROS: See pertinent positives and negatives per HPI. No hematuria   Past Medical History  Diagnosis Date  . IBS (irritable bowel syndrome)   . Thyroid goiter     I 131 rx and then  total throidectomy 2005 [facility]  . GERD (gastroesophageal reflux disease)   . Dyspnea     nl PFTs, and echo  . Recurrent oral ulcers   . Heart murmur   . Asthma   . Headache   . History of kidney stones   . Nephrolithiasis     Family History  Problem Relation Age of Onset  . Cancer Mother     bladder  . COPD Sister   . Coronary artery disease Sister     age 52 also copd  . Arthritis Other   . Cancer Other     breast  . Diabetes Other   . Hyperlipidemia Other   . Hypertension Other   . Stroke Other   . Heart disease Other   . Coronary artery disease Father     also had AAA  . Aneurysm Father     History   Social History  . Marital Status: Married    Spouse Name: N/A    Number of Children: N/A  . Years of Education: N/A   Social History Main Topics  . Smoking status: Never Smoker   . Smokeless tobacco: Never Used  . Alcohol Use: Yes     Comment: seldom  . Drug Use: No  . Sexually Active: Not on file   Other Topics Concern  . 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 ciprofloxacin (CIPRO) 500 MG tablet Take 1 tablet (500 mg total) by mouth 2 (two) times daily.  14 tablet  0  . Naproxen Sodium (ALEVE) 220 MG CAPS Take by mouth.        . pantoprazole (PROTONIX) 40 MG tablet Take 1 tablet (40 mg total) by mouth daily.  90  tablet  3  . pravastatin (PRAVACHOL) 40 MG tablet Take 1 tablet (40 mg total) by mouth daily.  90 tablet  3  . rOPINIRole (REQUIP) 0.5 MG tablet Take 1 tablet (0.5 mg total) by mouth 4 (four) times daily. Or as directed  360 tablet  3  . SYNTHROID 88 MCG tablet TAKE 1 TABLET BY MOUTH ONCE DAILY  90 tablet  1   No facility-administered encounter medications on file as of 05/09/2012.    EXAM:  BP 124/74  Pulse 85  Temp(Src) 97.4 F (36.3 C) (Oral)  Wt 164 lb (74.39 kg)  BMI 32.03 kg/m2  SpO2 95%  Body mass index is 32.03 kg/(m^2).  GENERAL: vitals reviewed and listed above, alert, oriented, appears well hydrated and in no acute distress  LUNGS: clear to auscultation bilaterally, no wheezes, rales or rhonchi, good air movement Abdomen:  Sof,t  normal bowel sounds without hepatosplenomegaly, no guarding rebound or masses no CVA tenderness minimally tender lower abd on deep palpation CV: HRRR, no clubbing cyanosis or  peripheral edema nl cap refill  MS: moves all extremities without noticeable focal  abnormality  PSYCH: pleasant and cooperative, no obvious depression or anxiety UA clear except trc protein  ASSESSMENT AND PLAN:  Discussed the following assessment and plan:  Acute pyelonephritis - resolving   ow nl ua  culture was not done as ordered on initial visit but NG on fu urine  - Plan: POC Urinalysis Dipstick Counseled. Reviewed reason for returns  -Patient advised to return or notify health care team  if symptoms worsen or persist or new concerns arise.  Patient Instructions  Finish medication.  Contact us if any  Relapsing sx  Fever severe pain.  Etc .  Fatigue should get better  With time.  Will send   Copy of note to dr Brunilda Payor.     Neta Mends. Selyna Klahn M.D.

## 2012-06-09 ENCOUNTER — Encounter (INDEPENDENT_AMBULATORY_CARE_PROVIDER_SITE_OTHER): Payer: BC Managed Care – PPO | Admitting: General Surgery

## 2012-07-01 ENCOUNTER — Ambulatory Visit (INDEPENDENT_AMBULATORY_CARE_PROVIDER_SITE_OTHER): Payer: BC Managed Care – PPO | Admitting: Internal Medicine

## 2012-07-01 DIAGNOSIS — E039 Hypothyroidism, unspecified: Secondary | ICD-10-CM

## 2012-07-01 DIAGNOSIS — E785 Hyperlipidemia, unspecified: Secondary | ICD-10-CM

## 2012-07-01 LAB — LIPID PANEL
HDL: 38.2 mg/dL — ABNORMAL LOW (ref >39.00)
LDL Cholesterol: 100 mg/dL — ABNORMAL HIGH (ref 0–99)
Total CHOL/HDL Ratio: 5
Triglycerides: 177 mg/dL — ABNORMAL HIGH (ref 0.0–149.0)

## 2012-07-01 LAB — TSH: TSH: 0.47 u[IU]/mL (ref 0.35–5.50)

## 2012-09-13 ENCOUNTER — Encounter: Payer: Self-pay | Admitting: Internal Medicine

## 2012-09-27 ENCOUNTER — Telehealth: Payer: Self-pay | Admitting: Internal Medicine

## 2012-09-27 NOTE — Telephone Encounter (Signed)
[facility] Triage Call Report Triage Record Num: [id number] Operator: Donna Bernard Patient Name: [facility] Call Date & Time: 09/26/2012 2:21:42PM Patient Phone: [phone] PCP: Patient Gender: Female PCP Fax : Patient DOB: November 13, 1950 [facility] Name: [facility] Reason for Call: Caller: Noelie/Patient; PCP: Berniece Andreas (Family Practice); CB#: [phone]; Call regarding Urinary Pain/Bleeding; onset 09/26/2012 left side low back/flank pain (rates 5 on 0-10 scale, frequency, burning, feels tired, afebrile, Aleve at 12:40; emergent sxs r/o per "Flank Pain" protocol except "Flank pain or low back pain AND urinary tract symptoms" positive with disposition of "See Provider within 4 hours"; patient declined to go to ED, RN attempted to schedule appointment with Dr. Fabian Sharp for 09/27/2012, patient declined appointment with other physicians but will bring a urine specimen to the office, RN advised to go to ED if symptoms worsen, home care advice given. Protocol(s) Used: Flank Pain Protocol(s) Used: Urinary Symptoms - Female Recommended Outcome per Protocol: See Provider within 4 hours Reason for Outcome: Flank pain Flank pain or low back pain AND urinary tract symptoms Care Advice: ~ Another adult should drive. ~ Call provider if symptoms worsen or new symptoms develop. ~ Tell provider medical history of renal disease; especially if have only one kidney. ~ SYMPTOM / CONDITION MANAGEMENT ~ CAUTIONS ~ List, or take, all current prescription(s), nonprescription or alternative medication(s) to provider for evaluation. Limit carbonated, alcoholic, and caffeinated beverages such as coffee, tea and soda. Avoid nonprescription cold and allergy medications that contain caffeine. Limit intake of tomatoes, fruit juices (except for unsweetened cranberry juice), dairy products, spicy foods, sugar, and artificial sweeteners (aspartame or saccharine). Stop or decrease smoking. Reducing  exposure to bladder irritants may help lessen urgency. ~ Systemic Inflammatory Response Syndrome (SIRS): Watch for signs of a generalized, whole body infection. Occurs within days of a localized infection, especially of the urinary, GI, respiratory or nervous systems; or after a traumatic injury or invasive procedure. - Call EMS 911 if symptoms have worsened, such as increasing confusion or unusual drowsiness; cold and clammy skin; no urine output; rapid respiration (>30/min.) or slow respiration (<10/min.); struggling to breathe. - Go to the ED immediately for early symptoms of rapid pulse >90/min. or rapid breathing >20/min. at rest; chills; oral temperature >100.4 F (38 C) or <96.8 F (36 C) when associated with conditions noted. ~ Analgesic/Antipyretic Advice - NSAIDs: Consider aspirin, ibuprofen, naproxen or ketoprofen for pain or fever as directed on label or by pharmacist/provider. PRECAUTIONS: - If over 45 years of age, should not take longer than 1 week without consulting provider. EXCEPTIONS: - Should not be used if taking blood thinners or have bleeding problems. - Do not use if have history of sensitivity/allergy to any of these medications; or history of cardiovascular, ulcer, kidney, liver disease or diabetes unless approved by provider. - Do not exceed recommended dose or frequency. ~ 09/26/2012 2:51:29PM Page 1 of 2 CAN_[id number]_V2 [facility] Triage Call Report Patient Name: [place] Fah[place] continuation page/s - Do not exceed recommended dose or frequency. Increase intake of fluids. Try to drink 8 oz. (.2 liter) every hour when awake, unless on restricted fluids for other medical reasons. Include at least two 8 oz. (.2 liter) glasses of unsweetened cranberry juice each day. Take sips of fluid or eat ice chips if nauseated or vomiting. ~ 09/26/2012 2:51:29PM Page 2 of 2 CAN_TriageRpt_V2

## 2012-10-25 ENCOUNTER — Other Ambulatory Visit: Payer: Self-pay | Admitting: Internal Medicine

## 2012-11-08 ENCOUNTER — Telehealth: Payer: Self-pay | Admitting: Internal Medicine

## 2012-11-08 NOTE — Telephone Encounter (Signed)
Pt needs cpx before end of dec 2014. Pt last cpx was on 01/24/13. Can I create 30  Min slot? No wed are avail

## 2012-11-09 NOTE — Telephone Encounter (Signed)
Done

## 2012-11-09 NOTE — Telephone Encounter (Signed)
lmom for pt to call back

## 2012-11-09 NOTE — Telephone Encounter (Signed)
yes

## 2012-11-25 ENCOUNTER — Encounter: Payer: Self-pay | Admitting: Internal Medicine

## 2012-11-25 ENCOUNTER — Ambulatory Visit (INDEPENDENT_AMBULATORY_CARE_PROVIDER_SITE_OTHER): Payer: BC Managed Care – PPO | Admitting: Internal Medicine

## 2012-11-25 VITALS — BP 142/84 | HR 87 | Temp 98.1°F | Wt 167.0 lb

## 2012-11-25 DIAGNOSIS — J208 Acute bronchitis due to other specified organisms: Secondary | ICD-10-CM

## 2012-11-25 DIAGNOSIS — J209 Acute bronchitis, unspecified: Secondary | ICD-10-CM

## 2012-11-25 MED ORDER — ALBUTEROL SULFATE HFA 108 (90 BASE) MCG/ACT IN AERS: 2.0000 | INHALATION_SPRAY | Freq: Four times a day (QID) | RESPIRATORY_TRACT | Status: DC | PRN

## 2012-11-25 MED ORDER — HYDROCOD POLST-CHLORPHEN POLST 10-8 MG/5ML PO LQCR: 5.0000 mL | Freq: Two times a day (BID) | ORAL | Status: DC | PRN

## 2012-11-25 NOTE — Progress Notes (Signed)
Chief Complaint  Patient presents with  . Cough    Cough is productive of a yellow sptum.  Ongoing since Monday.  . Nasal Congestion  . Scratchy throat  . Headache  . Shortness of Breath    HPI: Patient comes in today for SDA for  new problem evaluation. Onset 4 days ago of ha and then above sx with increasing dry painfuk cough and mid chest discomfort .  Family has had this illness but getting better she has coughing  Grand children have4 had runny noses. No wheezing per se  Tired no fever chille ROS: See pertinent positives and negatives per HPI. No vd rash  Other gi gu sx associated  Past Medical History  Diagnosis Date  . IBS (irritable bowel syndrome)   . Thyroid goiter     I 131 rx and then  total throidectomy 2005 [facility]  . GERD (gastroesophageal reflux disease)   . Dyspnea     nl PFTs, and echo  . Recurrent oral ulcers   . Heart murmur   . Asthma   . Headache(784.0)   . History of kidney stones   . Nephrolithiasis   . Acute pyelonephritis 05/02/2012    Family History  Problem Relation Age of Onset  . Cancer Mother     bladder  . COPD Sister   . Coronary artery disease Sister     age 36 also copd  . Arthritis Other   . Cancer Other     breast  . Diabetes Other   . Hyperlipidemia Other   . Hypertension Other   . Stroke Other   . Heart disease Other   . Coronary artery disease Father     also had AAA  . Aneurysm Father     History   Social History  . Marital Status: Married    Spouse Name: N/A    Number of Children: N/A  . Years of Education: N/A   Social History Main Topics  . Smoking status: Never Smoker   . Smokeless tobacco: Never Used  . Alcohol Use: Yes     Comment: seldom  . Drug Use: No  . Sexual Activity: None   Other Topics Concern  . 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 Naproxen Sodium (ALEVE) 220 MG CAPS Take by mouth.    . pantoprazole (PROTONIX) 40 MG tablet Take 1 tablet (40 mg total) by mouth daily.  . pravastatin (PRAVACHOL) 40 MG tablet Take 1 tablet (40 mg total) by mouth daily.  Marland Kitchen rOPINIRole (REQUIP) 0.5 MG tablet Take 1 tablet (0.5 mg total) by mouth 4 (four) times daily. Or as directed  . SYNTHROID 88 MCG tablet TAKE 1 TABLET BY MOUTH ONCE DAILY  . albuterol (PROAIR HFA) 108 (90 BASE) MCG/ACT inhaler Inhale 2 puffs into the lungs every 6 (six) hours as needed for wheezing.  . chlorpheniramine-HYDROcodone (TUSSIONEX) 10-8 MG/5ML LQCR Take 5 mLs by mouth every 12 (twelve) hours as needed.  . [DISCONTINUED] ciprofloxacin (CIPRO) 500 MG tablet Take 1 tablet (500 mg total) by mouth 2 (two) times daily.    EXAM:  BP 142/84  Pulse 87  Temp(Src) 98.1 F (36.7 C) (Oral)  Wt 167 lb (75.751 kg)  BMI 32.62  kg/m2  SpO2 95%  Body mass index is 32.62 kg/(m^2). WDWN in NAD  quiet respirations; mildly congested  somewhat hoarse. Non toxic . Barky honking cough  HEENT: Normocephalic ;atraumatic , Eyes;  PERRL, EOMs  Full, lids and conjunctiva clear,,Ears: no deformities, canals nl, TM landmarks normal, Nose: no deformity or discharge but congested;face minimally tender Mouth : OP clear without lesion or edema . Neck: Supple without adenopathy or masses or bruits Chest:  Clear to A&P without wheezes rales or rhonchi CV:  S1-S2 no gallops or murmurs peripheral perfusion is normal Skin :nl perfusion and no acute rashes  MS: moves all extremities without noticeable focal  abnormality PSYCH: pleasant and cooperative, no obvious depression or anxiety  ASSESSMENT AND PLAN:  Discussed the following assessment and plan:  Acute bronchitis, viral - cw illness in community no complcations at this time but has sinus tenderness  contact for alarm sx need fo rsinusitis rx etc. cough med inhaler if needed   Expectant management.  -Patient advised  to return or notify health care team  if symptoms worsen or persist or new concerns arise. Total visit > 50% spent counseling and coordinating care     Patient Instructions  Chest is clear today .Viral infection . Warm liquids steam plain ,mucinex ok Cough med for comfort . Contact us if face pain that not resolving and sometimes antibiotic anc help the sinusitis but not really the cough.     Acute Bronchitis You have acute bronchitis. This means you have a chest cold. The airways in your lungs are red and sore (inflamed). Acute means it is sudden onset.  CAUSES Bronchitis is most often caused by the same virus that causes a cold. SYMPTOMS   Body aches.  Chest congestion.  Chills.  Cough.  Fever.  Shortness of breath.  Sore throat. TREATMENT  Acute bronchitis is usually treated with rest, fluids, and medicines for relief of fever or cough. Most symptoms should go away after a few days or a week. Increased fluids may help thin your secretions and will prevent dehydration. Your caregiver may give you an inhaler to improve your symptoms. The inhaler reduces shortness of breath and helps control cough. You can take over-the-counter pain relievers or cough medicine to decrease coughing, pain, or fever. A cool-air vaporizer may help thin bronchial secretions and make it easier to clear your chest. Antibiotics are usually not needed but can be prescribed if you smoke, are seriously ill, have chronic lung problems, are elderly, or you are at higher risk for developing complications.Allergies and asthma can make bronchitis worse. Repeated episodes of bronchitis may cause longstanding lung problems. Avoid smoking and secondhand smoke.Exposure to cigarette smoke or irritating chemicals will make bronchitis worse. If you are a cigarette smoker, consider using nicotine gum or skin patches to help control withdrawal symptoms. Quitting smoking will help your lungs heal faster. Recovery  from bronchitis is often slow, but you should start feeling better after 2 to 3 days. Cough from bronchitis frequently lasts for 3 to 4 weeks. To prevent another bout of acute bronchitis:  Quit smoking.  Wash your hands frequently to get rid of viruses or use a hand sanitizer.  Avoid other people with cold or virus symptoms.  Try not to touch your hands to your mouth, nose, or eyes. SEEK IMMEDIATE MEDICAL CARE IF:  You develop increased fever, chills, or chest pain.  You have severe shortness of breath or bloody sputum.  You develop dehydration, fainting, repeated  vomiting, or a severe headache.  You have no improvement after 1 week of treatment or you get worse. MAKE SURE YOU:   Understand these instructions.  Will watch your condition.  Will get help right away if you are not doing well or get worse. Document Released: 02/20/2004 Document Revised: 04/06/2011 Document Reviewed: 05/07/2010 [facility] Patient Information 2014 [place], [place].      Neta Mends. Georgetta Crafton M.D.

## 2012-11-25 NOTE — Patient Instructions (Addendum)
Chest is clear today .Viral infection . Warm liquids steam plain ,mucinex ok Cough med for comfort . Contact us if face pain that not resolving and sometimes antibiotic anc help the sinusitis but not really the cough.     Acute Bronchitis You have acute bronchitis. This means you have a chest cold. The airways in your lungs are red and sore (inflamed). Acute means it is sudden onset.  CAUSES Bronchitis is most often caused by the same virus that causes a cold. SYMPTOMS   Body aches.  Chest congestion.  Chills.  Cough.  Fever.  Shortness of breath.  Sore throat. TREATMENT  Acute bronchitis is usually treated with rest, fluids, and medicines for relief of fever or cough. Most symptoms should go away after a few days or a week. Increased fluids may help thin your secretions and will prevent dehydration. Your caregiver may give you an inhaler to improve your symptoms. The inhaler reduces shortness of breath and helps control cough. You can take over-the-counter pain relievers or cough medicine to decrease coughing, pain, or fever. A cool-air vaporizer may help thin bronchial secretions and make it easier to clear your chest. Antibiotics are usually not needed but can be prescribed if you smoke, are seriously ill, have chronic lung problems, are elderly, or you are at higher risk for developing complications.Allergies and asthma can make bronchitis worse. Repeated episodes of bronchitis may cause longstanding lung problems. Avoid smoking and secondhand smoke.Exposure to cigarette smoke or irritating chemicals will make bronchitis worse. If you are a cigarette smoker, consider using nicotine gum or skin patches to help control withdrawal symptoms. Quitting smoking will help your lungs heal faster. Recovery from bronchitis is often slow, but you should start feeling better after 2 to 3 days. Cough from bronchitis frequently lasts for 3 to 4 weeks. To prevent another bout of acute  bronchitis:  Quit smoking.  Wash your hands frequently to get rid of viruses or use a hand sanitizer.  Avoid other people with cold or virus symptoms.  Try not to touch your hands to your mouth, nose, or eyes. SEEK IMMEDIATE MEDICAL CARE IF:  You develop increased fever, chills, or chest pain.  You have severe shortness of breath or bloody sputum.  You develop dehydration, fainting, repeated vomiting, or a severe headache.  You have no improvement after 1 week of treatment or you get worse. MAKE SURE YOU:   Understand these instructions.  Will watch your condition.  Will get help right away if you are not doing well or get worse. Document Released: 02/20/2004 Document Revised: 04/06/2011 Document Reviewed: 05/07/2010 [facility] Patient Information 2014 [place], [place].

## 2012-11-28 ENCOUNTER — Ambulatory Visit (INDEPENDENT_AMBULATORY_CARE_PROVIDER_SITE_OTHER): Payer: BC Managed Care – PPO | Admitting: Internal Medicine

## 2012-11-28 ENCOUNTER — Other Ambulatory Visit: Payer: Self-pay | Admitting: Family Medicine

## 2012-11-28 ENCOUNTER — Encounter: Payer: Self-pay | Admitting: Internal Medicine

## 2012-11-28 ENCOUNTER — Telehealth: Payer: Self-pay | Admitting: Internal Medicine

## 2012-11-28 ENCOUNTER — Ambulatory Visit (INDEPENDENT_AMBULATORY_CARE_PROVIDER_SITE_OTHER)
Admission: RE | Admit: 2012-11-28 | Discharge: 2012-11-28 | Disposition: A | Discharge status: Home / Self Care | Payer: BC Managed Care – PPO | Source: Ambulatory Visit | Attending: Internal Medicine | Admitting: Internal Medicine

## 2012-11-28 VITALS — BP 142/82 | HR 90 | Temp 98.0°F | Wt 164.0 lb

## 2012-11-28 DIAGNOSIS — J209 Acute bronchitis, unspecified: Secondary | ICD-10-CM

## 2012-11-28 DIAGNOSIS — R053 Chronic cough: Secondary | ICD-10-CM

## 2012-11-28 DIAGNOSIS — R059 Cough, unspecified: Secondary | ICD-10-CM

## 2012-11-28 DIAGNOSIS — R0602 Shortness of breath: Secondary | ICD-10-CM

## 2012-11-28 DIAGNOSIS — R05 Cough: Secondary | ICD-10-CM

## 2012-11-28 MED ORDER — AZITHROMYCIN 250 MG PO TABS: 250.0000 mg | ORAL_TABLET | ORAL | Status: DC

## 2012-11-28 MED ORDER — PREDNISONE 20 MG PO TABS: ORAL_TABLET | ORAL | Status: DC

## 2012-11-28 NOTE — Progress Notes (Signed)
Chief Complaint  Patient presents with  . Follow-up    cough is bad tired     HPI: Patient comes in today for SDA for  Ongoing   problem evaluation. Seen last week for acute laryngitis and coughing  Felt viral  Having a hard time with this   Bad weekend  Is exhausted  cold and sweaty and cold    coughing up some and tinge of blood  Also from nose.  ?   no wheezing but short of breath    . Cough med tussionex not that helpful No cive vomiting fluids intake  ok  Chief Complaint  Patient presents with  . Follow-up    cough is bad tired    ROS  No  Vomiting syncope rash change in gi gu sx no chills but has sweats  EXAM:  BP 142/82  Pulse 90  Temp(Src) 98 F (36.7 C) (Oral)  Wt 164 lb (74.39 kg)  SpO2 93%  Body mass index is 32.03 kg/(m^2). WDWN in NAD  quiet respirations; mildly congested  somewhat hoarse. Non toxic . lloks very tored and severe coughing speels  No stridor  HEENT: Normocephalic ;atraumatic , Eyes;  PERRL, EOMs  Full, lids and conjunctiva clear,,Ears: no deformities, canals nl, TM landmarks normal, Nose: no deformity or discharge but congested;face minimally tender Mouth : OP clear without lesion or edema . Neck: Supple without adenopathy or masses or bruits Chest:  Clear to A&P without wheezes rales or rhonchi CV:  S1-S2 no gallops or murmurs peripheral perfusion is normal Skin :nl perfusion and no acute rashes  c xray no pna air space disease    ASSESSMENT AND PLAN:  Discussed the following assessment and plan:  Cough, persistent - more severe after a week  blood ? upper airway add steroid and empiric antibiotic although may still be viral   Shortness of breath continued coughing with snmall amount of bleed poss upper airway    Still could be viral empriric rx for atypicals and pred for bronchospasm.  -Patient advised to return or notify health care team  if symptoms worsen or persist or new concerns arise.  Patient Instructions  This still could be a  viral infection but because of the severity and length of illness  We can aqdd antibiotic  And prednisone  To decrease swelling in the airways    Burna Mortimer K. Panosh M.D.

## 2012-11-28 NOTE — Patient Instructions (Signed)
This still could be a viral infection but because of the severity and length of illness  We can aqdd antibiotic  And prednisone  To decrease swelling in the airways

## 2012-11-28 NOTE — Telephone Encounter (Signed)
Order placed in the system.  Pt notified to have chest x-ray this morning so it will be ready for her appt this afternoon.

## 2012-11-28 NOTE — Telephone Encounter (Signed)
Pt seen fri and instructed to call back if not better. Pt dx w/ bronchitis. Pt still coughing. Hot and cold flashes. Would like to know if she should go for chest xray prior to coming back in today or should she even come back in?  appt scheduled 1:30 pm pls advise

## 2012-12-01 ENCOUNTER — Other Ambulatory Visit: Payer: Self-pay

## 2012-12-07 ENCOUNTER — Other Ambulatory Visit: Payer: Self-pay | Admitting: Internal Medicine

## 2012-12-07 ENCOUNTER — Telehealth: Payer: Self-pay | Admitting: Internal Medicine

## 2012-12-07 DIAGNOSIS — N644 Mastodynia: Secondary | ICD-10-CM

## 2012-12-07 NOTE — Telephone Encounter (Signed)
Pt is aware misty will call her

## 2012-12-07 NOTE — Telephone Encounter (Signed)
Ok to order 

## 2012-12-07 NOTE — Telephone Encounter (Signed)
Order placed in the system by Earle Gell.  Pt aware.

## 2012-12-07 NOTE — Telephone Encounter (Signed)
Pt has had a pain in left breast. Sore and a shooting pain in that breast. Pt is due for a mammogram, called [facility] and they can get her in, but would recommend a diagnostic or 3d . Ned order from MD. Can you do that for pt. pls call asap. fyi.Pt has family history of breast cancer

## 2012-12-07 NOTE — Telephone Encounter (Signed)
Orders placed.

## 2012-12-07 NOTE — Telephone Encounter (Signed)
Ok to order diagnostic mammo for breast pain  And then fu visit  If  continuing sx

## 2012-12-07 NOTE — Telephone Encounter (Signed)
Pt is aware we will fax order to [facility] today. Pt would like MD to return her call

## 2012-12-07 NOTE — Telephone Encounter (Signed)
Pt following up on request due to pt wanting to do this asap. pls advise.

## 2012-12-08 NOTE — Telephone Encounter (Addendum)
Pt would like dr Fabian Sharp to call her concerning her issue.

## 2012-12-08 NOTE — Telephone Encounter (Signed)
Disc situation   To have mammo on Monday nov 17  Disc concerns about delay in ordering faxed to the facility but they did get it.   Send to Production designer, [employer]tele[employer] for review.

## 2013-01-06 ENCOUNTER — Encounter: Payer: Self-pay | Admitting: Internal Medicine

## 2013-01-13 ENCOUNTER — Other Ambulatory Visit (INDEPENDENT_AMBULATORY_CARE_PROVIDER_SITE_OTHER): Payer: BC Managed Care – PPO

## 2013-01-13 DIAGNOSIS — Z0000 Encounter for general adult medical examination without abnormal findings: Secondary | ICD-10-CM

## 2013-01-13 LAB — LIPID PANEL
Cholesterol: 192 mg/dL (ref 0–200)
HDL: 43.1 mg/dL (ref >39.00)
LDL Cholesterol: 118 mg/dL — ABNORMAL HIGH (ref 0–99)
Triglycerides: 155 mg/dL — ABNORMAL HIGH (ref 0.0–149.0)

## 2013-01-13 LAB — CBC WITH DIFFERENTIAL/PLATELET
Eosinophils Relative: 1.9 % (ref 0.0–5.0)
Lymphocytes Relative: 35.5 % (ref 12.0–46.0)
MCV: 93.6 fl (ref 78.0–100.0)
Monocytes Absolute: 0.4 10*3/uL (ref 0.1–1.0)
Monocytes Relative: 9 % (ref 3.0–12.0)
Neutrophils Relative %: 53.4 % (ref 43.0–77.0)
Platelets: 310 10*3/uL (ref 150.0–400.0)
WBC: 5 10*3/uL (ref 4.5–10.5)

## 2013-01-13 LAB — HEPATIC FUNCTION PANEL
ALT: 25 U/L (ref 0–35)
Bilirubin, Direct: 0 mg/dL (ref 0.0–0.3)
Total Protein: 6.8 g/dL (ref 6.0–8.3)

## 2013-01-13 LAB — BASIC METABOLIC PANEL
BUN: 19 mg/dL (ref 6–23)
Calcium: 9.3 mg/dL (ref 8.4–10.5)
Creatinine, Ser: 0.8 mg/dL (ref 0.4–1.2)
GFR: 83.09 mL/min (ref >60.00)

## 2013-01-20 ENCOUNTER — Encounter: Payer: BC Managed Care – PPO | Admitting: Internal Medicine

## 2013-01-20 ENCOUNTER — Encounter: Payer: Self-pay | Admitting: Internal Medicine

## 2013-01-24 ENCOUNTER — Encounter: Payer: Self-pay | Admitting: Internal Medicine

## 2013-01-24 ENCOUNTER — Ambulatory Visit (INDEPENDENT_AMBULATORY_CARE_PROVIDER_SITE_OTHER): Payer: BC Managed Care – PPO | Admitting: Internal Medicine

## 2013-01-24 VITALS — BP 134/80 | HR 79 | Temp 98.4°F | Ht 60.0 in | Wt 166.0 lb

## 2013-01-24 DIAGNOSIS — R05 Cough: Secondary | ICD-10-CM

## 2013-01-24 DIAGNOSIS — R0609 Other forms of dyspnea: Secondary | ICD-10-CM

## 2013-01-24 DIAGNOSIS — E785 Hyperlipidemia, unspecified: Secondary | ICD-10-CM

## 2013-01-24 DIAGNOSIS — R7309 Other abnormal glucose: Secondary | ICD-10-CM

## 2013-01-24 DIAGNOSIS — R7303 Prediabetes: Secondary | ICD-10-CM

## 2013-01-24 DIAGNOSIS — Z638 Other specified problems related to primary support group: Secondary | ICD-10-CM

## 2013-01-24 DIAGNOSIS — R053 Chronic cough: Secondary | ICD-10-CM

## 2013-01-24 DIAGNOSIS — E039 Hypothyroidism, unspecified: Secondary | ICD-10-CM

## 2013-01-24 DIAGNOSIS — Z6379 Other stressful life events affecting family and household: Secondary | ICD-10-CM | POA: Insufficient documentation

## 2013-01-24 DIAGNOSIS — R059 Cough, unspecified: Secondary | ICD-10-CM

## 2013-01-24 DIAGNOSIS — R0600 Dyspnea, unspecified: Secondary | ICD-10-CM | POA: Insufficient documentation

## 2013-01-24 DIAGNOSIS — R030 Elevated blood-pressure reading, without diagnosis of hypertension: Secondary | ICD-10-CM | POA: Insufficient documentation

## 2013-01-24 DIAGNOSIS — R0789 Other chest pain: Secondary | ICD-10-CM | POA: Insufficient documentation

## 2013-01-24 DIAGNOSIS — K219 Gastro-esophageal reflux disease without esophagitis: Secondary | ICD-10-CM

## 2013-01-24 DIAGNOSIS — Z0000 Encounter for general adult medical examination without abnormal findings: Secondary | ICD-10-CM

## 2013-01-24 DIAGNOSIS — G2581 Restless legs syndrome: Secondary | ICD-10-CM

## 2013-01-24 DIAGNOSIS — R6889 Other general symptoms and signs: Secondary | ICD-10-CM | POA: Insufficient documentation

## 2013-01-24 DIAGNOSIS — Z23 Encounter for immunization: Secondary | ICD-10-CM

## 2013-01-24 DIAGNOSIS — Z2911 Encounter for prophylactic immunotherapy for respiratory syncytial virus (RSV): Secondary | ICD-10-CM

## 2013-01-24 MED ORDER — SYNTHROID 88 MCG PO TABS: ORAL_TABLET | ORAL | Status: DC

## 2013-01-24 NOTE — Patient Instructions (Addendum)
Would have Dr. Kinnie Scales involved with your nocturnal hgb as your sx continue.will  make appt with him .  Advise we get dr Eden Emms to see you again be cause of your change in exercise tolerance over the last few  Months . Will do referral . Consider echo cardiogram in the meantime .  Cough could be from reflux or  reactive airways  .  You stress test and lung tests were nl in 2010  wegihit loss would help your conditions  mediterranean diet   And dash diet   Low simple carbs .   Check BP readings  At least 5 days per week and  Plan FU with results in a month or so . If 140/90 and above would add other medication.   No change in thyroid medication.

## 2013-01-24 NOTE — Progress Notes (Signed)
Chief Complaint  Patient presents with  . Annual Exam    Number of concerns  . Hypothyroidism    HPI: Patient comes in today for Preventive Health Care visit but has a number of concerns.;   Thyroid No change in medicine needs refill  Cough  ;Since September r  ? reflux problem wakes up. At night Burns   at times At [facility]   colono done by Dr Kinnie Scales. Is on a PPI  Cp : Recent onset mid to left-sided ocass ache left  for a number of months Had mammo   Last up to hour   Or so sitting down ccan help.  Uncertain triggers.  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   DOE  wirh exercise has  To sit and rest   Parking loing parking lot to ed .  Usually can walk for miles  .   Doe since fall with cough   Had nuclear medicine stress test ; pulmonary function tests in 2010 for shortness of breath symptoms normal testing low-risk  These symptoms are new since then in the last few months.  Feels somewhat depressed with child family stress  Issues  ( DUI)   Health Maintenance  Topic Date Due  . Influenza Vaccine  12/26/2013  . Mammogram  12/13/2014  . Colonoscopy  08/30/2022  . Tetanus/tdap  01/25/2023  . Zostavax  Completed   Health Maintenance Review   ROS: hard to lose weight  GEN/ HEENT: No fever, significant weight changes sweats headaches vision problems hearing changes, CV/ PULM; No  Syncope, edema in legas about the same left more than right  GI /GU: No adominal pain, vomiting, change in bowel habits. No blood in the stool. No significant GU symptoms. SKIN/HEME: ,no acute skin rashes suspicious lesions or bleeding. No lymphadenopathy, nodules, masses.  NEURO/ PSYCH:  No neurologic signs such as weakness numbness. No  anxiety. IMM/ Allergy: No unusual infections.  Allergy .   REST of 12 system review negative except as per HPI   Past Medical History  Diagnosis Date  . IBS (irritable bowel syndrome)   . Thyroid goiter     I 131 rx and then  total throidectomy 2005 [facility]  .  GERD (gastroesophageal reflux disease)   . Dyspnea     nl PFTs, and echo  . Recurrent oral ulcers   . Heart murmur   . Asthma   . Headache(784.0)   . History of kidney stones   . Nephrolithiasis   . Acute pyelonephritis 05/02/2012    Family History  Problem Relation Age of Onset  . Cancer Mother     bladder  . COPD Sister   . Coronary artery disease Sister     age 86 also copd  . Arthritis Other   . Cancer Other     breast  . Diabetes Other   . Hyperlipidemia Other   . Hypertension Other   . Stroke Other   . Heart disease Other   . Coronary artery disease Father     also had AAA  . Aneurysm Father     History   Social History  . Marital Status: Married    Spouse Name: N/A    Number of Children: N/A  . Years of Education: N/A   Social History Main Topics  . Smoking status: Never Smoker   . Smokeless tobacco: Never Used  . Alcohol Use: Yes     Comment: seldom  . Drug Use: No  .  Sexual Activity: None   Other Topics Concern  . 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 albuterol (PROAIR HFA) 108 (90 BASE) MCG/ACT inhaler Inhale 2 puffs into the lungs every 6 (six) hours as needed for wheezing.  . Naproxen Sodium (ALEVE) 220 MG CAPS Take 220 mg by mouth 2 (two) times daily.   . pantoprazole (PROTONIX) 40 MG tablet Take 1 tablet (40 mg total) by mouth daily.  . pravastatin (PRAVACHOL) 40 MG tablet Take 1 tablet (40 mg total) by mouth daily.  Marland Kitchen rOPINIRole (REQUIP) 0.5 MG tablet Take 1 tablet (0.5 mg total) by mouth 4 (four) times daily. Or as directed  . SYNTHROID 88 MCG tablet TAKE 1 TABLET BY MOUTH ONCE DAILY  . [DISCONTINUED] SYNTHROID 88 MCG tablet TAKE 1 TABLET BY MOUTH ONCE DAILY  . [DISCONTINUED] azithromycin (ZITHROMAX Z-PAK) 250 MG tablet Take 1 tablet (250 mg total) by mouth as  directed. Take 2 po first day, then 1 po qd  . [DISCONTINUED] chlorpheniramine-HYDROcodone (TUSSIONEX) 10-8 MG/5ML LQCR Take 5 mLs by mouth every 12 (twelve) hours as needed.  . [DISCONTINUED] predniSONE (DELTASONE) 20 MG tablet Take 3 po qd for 2 days then 2 po qd for 3 days,or as directed    EXAM:  BP 134/80  Pulse 79  Temp(Src) 98.4 F (36.9 C) (Oral)  Ht 5' (1.524 m)  Wt 166 lb (75.297 kg)  BMI 32.42 kg/m2  SpO2 95%  Body mass index is 32.42 kg/(m^2).  Physical Exam: Vital signs reviewed ZOX:WRUE is a well-developed well-nourished alert cooperative   female who appears her stated age in no acute distress.  HEENT: normocephalic atraumatic , Eyes: PERRL EOM's full, conjunctiva clear, Nares: paten,t no deformity discharge or tenderness., Ears: no deformity EAC's clear TMs with normal landmarks. Mouth: clear OP, no lesions, edema.  Moist mucous membranes. Dentition in adequate repair. NECK: supple without masses, thyromegaly or bruits. CHEST/PULM:  Clear to auscultation and percussion breath sounds equal no wheeze , rales or rhonchi. No chest wall deformities or tenderness. Breast: normal by inspection . No dimpling, discharge, masses, tenderness or discharge . CV: PMI is nondisplaced, S1 S2 no gallops, ? whispy murmur sys no radiation npo g or , rubs. Peripheral pulses are full without delay.No JVD .  ABDOMEN: Bowel sounds normal nontender  No guard or rebound, no hepato splenomegal no CVA tenderness.  No hernia. Extremtities:  No clubbing cyanosis , no acute joint swelling or redness no focal atrophy mild  edema ? Chronic skin changes  left more than right  NEURO:  Oriented x3, cranial nerves 3-12 appear to be intact, no obvious focal weakness,gait within normal limits no abnormal reflexes SKIN: No acute rashes normal turgor, color, no bruising or petechiae. PSYCH: Oriented, good eye contact, no obvious depression anxiety, cognition and judgment appear normal. LN: no cervical  axillary inguinal adenopathy EKG nsr r  r' noacute findings   Lab Results  Component Value Date   WBC 5.0 01/13/2013   HGB 14.1 01/13/2013   HCT 41.3 01/13/2013   PLT 310.0 01/13/2013   GLUCOSE 108* 01/13/2013   CHOL 192 01/13/2013   TRIG 155.0* 01/13/2013   HDL 43.10 01/13/2013   LDLDIRECT 193.9 03/25/2012   LDLCALC 118* 01/13/2013  ALT 25 01/13/2013   AST 20 01/13/2013   NA 141 01/13/2013   K 4.6 01/13/2013   CL 106 01/13/2013   CREATININE 0.8 01/13/2013   BUN 19 01/13/2013   CO2 28 01/13/2013   TSH 1.10 01/13/2013   INR 0.97 10/27/2010   HGBA1C 6.0 03/25/2012    ASSESSMENT AND PLAN:  Discussed the following assessment and plan:  Visit for preventive health examination  Pre-diabetes  HYPOTHYROIDISM  HYPERLIPIDEMIA NEC/NOS - on medication  Dyspnea on exertion - concern recnet onset  many possiblitlies - Plan: EKG 12-Lead, Ambulatory referral to Cardiology, 2D Echocardiogram without contrast  Esophageal reflux - nocturnal sx despite ppi see dr Kinnie Scales - Plan: Ambulatory referral to Gastroenterology  Elevated blood pressure reading - pt states runs low 120 range recnetly at home  better on repeat.  - Plan: EKG 12-Lead, 2D Echocardiogram without contrast  Need for shingles vaccine - Plan: Varicella-zoster vaccine subcutaneous  Need for Tdap vaccination - Plan: Tdap vaccine greater than or equal to 7yo IM  SYNDROME, RESTLESS LEGS - Plan: EKG 12-Lead  Chest discomfort - ? GERD vs other had risk  - Plan: EKG 12-Lead, Ambulatory referral to Cardiology, 2D Echocardiogram without contrast  Stressful life event affecting family  Decreased exercise tolerance - Plan: Ambulatory referral to Cardiology, 2D Echocardiogram without contrast  Cough, persistent - poss from refulx rx with for resp infection and also broncho spasm with prednisone not that responsive  - Plan: Ambulatory referral to Gastroenterology Here for preventive visit has a number of symptoms  Updated  immunizations.  Reflux sx and  dry cough nocturnal heartburn despite PPI use.  Increasing shortness of breath dyspnea on exertion states it is different than what she had the evaluation a few years ago. She has intermittent mid to left chest ache not associated with exercise however rest does help but is vague had a negative mammogram uncertain cause.  No acute events reported.  Had cardiac and pulmonary function evaluation in the past. These should be revisited because of her symptom complex her last few months. Had dobutrmine stress test in 2010 Nl pfts 2010  Had pred burst and no help with chest sx last month or so   Hx of asthma when pregnant not otherwise   At this time we'll order echocardiogram and get her back appt with dr Eden Emms   Also to dr [facility].   Other evaluation depending on this evaluation.  Will fu in a  Months and discuss any other issues  Wt Readings from Last 3 Encounters:  01/24/13 166 lb (75.297 kg)  11/28/12 164 lb (74.39 kg)  11/25/12 167 lb (75.751 kg)    Patient Care Team: Madelin Headings, MD as PCP - General Riyaz Jinnah as Referring Physician (Orthopedic Surgery) Lindaann Slough, MD as Attending Physician (Urology) Wendall Stade, MD as Consulting Physician (Cardiology) Patient Instructions  Would have Dr. Kinnie Scales involved with your nocturnal hgb as your sx continue.will  make appt with him .  Advise we get dr Eden Emms to see you again be cause of your change in exercise tolerance over the last few  Months . Will do referral . Consider echo cardiogram in the meantime .  Cough could be from reflux or  reactive airways  .  You stress test and lung tests were nl in 2010  wegihit loss would help your conditions  mediterranean diet   And dash diet   Low simple carbs .   Check BP readings  At least  5 days per week and  Plan FU with results in a month or so . If 140/90 and above would add other medication.   No change in thyroid medication.     Neta Mends.  Nikeya Maxim M.D.   Pre visit review using our clinic review tool, if applicable. No additional management support is needed unless otherwise documented below in the visit note.

## 2013-02-13 ENCOUNTER — Ambulatory Visit (HOSPITAL_COMMUNITY)
Admission: RE | Admit: 2013-02-13 | Discharge: 2013-02-13 | Disposition: A | Discharge status: Home / Self Care | Payer: BC Managed Care – PPO | Source: Ambulatory Visit | Attending: Internal Medicine | Admitting: Internal Medicine

## 2013-02-13 ENCOUNTER — Other Ambulatory Visit (HOSPITAL_COMMUNITY): Payer: BC Managed Care – PPO

## 2013-02-13 DIAGNOSIS — R0609 Other forms of dyspnea: Secondary | ICD-10-CM

## 2013-02-13 DIAGNOSIS — E785 Hyperlipidemia, unspecified: Secondary | ICD-10-CM | POA: Insufficient documentation

## 2013-02-13 DIAGNOSIS — R0989 Other specified symptoms and signs involving the circulatory and respiratory systems: Principal | ICD-10-CM | POA: Insufficient documentation

## 2013-02-13 DIAGNOSIS — R0600 Dyspnea, unspecified: Secondary | ICD-10-CM

## 2013-02-13 DIAGNOSIS — R0789 Other chest pain: Secondary | ICD-10-CM

## 2013-02-13 DIAGNOSIS — R6889 Other general symptoms and signs: Secondary | ICD-10-CM

## 2013-02-13 DIAGNOSIS — R030 Elevated blood-pressure reading, without diagnosis of hypertension: Secondary | ICD-10-CM

## 2013-02-13 DIAGNOSIS — R011 Cardiac murmur, unspecified: Secondary | ICD-10-CM | POA: Insufficient documentation

## 2013-02-13 NOTE — Progress Notes (Signed)
Echo Lab  2D Echocardiogram completed.  [place], [place] 02/13/2013 2:39 PM

## 2013-02-14 ENCOUNTER — Telehealth: Payer: Self-pay | Admitting: Internal Medicine

## 2013-02-14 NOTE — Telephone Encounter (Signed)
Results are back. Please advise.

## 2013-02-14 NOTE — Telephone Encounter (Signed)
Pt needs results from echocardiogram done at [facility]

## 2013-02-14 NOTE — Telephone Encounter (Signed)
Please see result note   Echo is good

## 2013-02-14 NOTE — Telephone Encounter (Signed)
Pt calling again as she is anxious about getting results before she goes for her endoscopy on Thursday.

## 2013-02-28 ENCOUNTER — Encounter: Payer: Self-pay | Admitting: Cardiovascular Disease

## 2013-02-28 ENCOUNTER — Institutional Professional Consult (permissible substitution): Payer: BC Managed Care – PPO | Admitting: Cardiovascular Disease

## 2013-02-28 ENCOUNTER — Ambulatory Visit (INDEPENDENT_AMBULATORY_CARE_PROVIDER_SITE_OTHER): Payer: BC Managed Care – PPO | Admitting: Cardiovascular Disease

## 2013-02-28 VITALS — BP 124/78 | HR 96 | Ht 60.0 in | Wt 169.0 lb

## 2013-02-28 DIAGNOSIS — R0602 Shortness of breath: Secondary | ICD-10-CM

## 2013-02-28 DIAGNOSIS — R0789 Other chest pain: Secondary | ICD-10-CM

## 2013-02-28 DIAGNOSIS — R079 Chest pain, unspecified: Secondary | ICD-10-CM

## 2013-02-28 DIAGNOSIS — R011 Cardiac murmur, unspecified: Secondary | ICD-10-CM

## 2013-02-28 NOTE — Progress Notes (Signed)
Patient ID: [name], female   DOB: May 03, 1950, 63 y.o.   MRN: [id number]    63 yo referred by Dr Glori Bickers for chest pain . Last seen over 3 years ago by Dr Percival Spanish for atypical chest pain  Had normal dobutamine echo in 2010  She has had URI and cough for over 2 months Pain in chest is atypical right and left sided Can be positional No pleurisy.  No sputum or fever.  Being Rx for GERD and reflux but cough not better.  Dyspnea more marked last two months Has gained about 15 lbs this year.  4 daughters. Oldest Anderson Malta is a patient of mine had congenital heart disease.  She is exacerbating her mom with smoking drinking (DUI) divorced and basically self destructing.  No documented CAD  Dr Earlean Shawl who did recent EGD note murmur Echo done 02/27/13 benign  EF 60-65% no significant valve disease    ROS: Denies fever, malais, weight loss, blurry vision, decreased visual acuity, cough, sputum, SOB, hemoptysis, pleuritic pain, palpitaitons, heartburn, abdominal pain, melena, lower extremity edema, claudication, or rash.  All other systems reviewed and negative   General: Affect appropriate Healthy:  appears stated age 63: normal Neck supple with no adenopathy JVP normal no bruits no thyromegaly Lungs clear with no wheezing and good diaphragmatic motion Heart:  S1/S2 3/6 SEM murmur,rub, gallop or click PMI normal Abdomen: benighn, BS positve, no tenderness, no AAA no bruit.  No HSM or HJR Distal pulses intact with no bruits No edema Neuro non-focal Skin warm and dry No muscular weakness  Medications Current Outpatient Prescriptions  Medication Sig Dispense Refill  . albuterol (PROAIR HFA) 108 (90 BASE) MCG/ACT inhaler Inhale 2 puffs into the lungs every 6 (six) hours as needed for wheezing.  1 Inhaler  1  . pantoprazole (PROTONIX) 40 MG tablet Take 40 mg by mouth 2 (two) times daily.      . pravastatin (PRAVACHOL) 40 MG tablet Take 1 tablet (40 mg total) by mouth daily.  90 tablet  3  .  rOPINIRole (REQUIP) 0.5 MG tablet Take 1 tablet (0.5 mg total) by mouth 4 (four) times daily. Or as directed  360 tablet  3  . SYNTHROID 88 MCG tablet TAKE 1 TABLET BY MOUTH ONCE DAILY  90 tablet  3   No current facility-administered medications for this visit.    Allergies Ritalin  Family History: Family History  Problem Relation Age of Onset  . Cancer Mother     bladder  . COPD Sister   . Coronary artery disease Sister     age 97 also copd  . Arthritis Other   . Cancer Other     breast  . Diabetes Other   . Hyperlipidemia Other   . Hypertension Other   . Stroke Other   . Heart disease Other   . Coronary artery disease Father     also had AAA  . Aneurysm Father     Social History: History   Social History  . Marital Status: Married    Spouse Name: N/A    Number of Children: N/A  . Years of Education: N/A   Occupational History  . Not on file.   Social History Main Topics  . Smoking status: Never Smoker   . Smokeless tobacco: Never Used  . Alcohol Use: Yes     Comment: seldom  . Drug Use: No  . Sexual Activity: Not on file   Other Topics Concern  . Not on  file   Social History Narrative   Married   Has grandchildren   Never smoked    G4P4   hh of 2  care of GC ages 64 - 69 month  5 days a week    Electrocardiogram:  NSR normal ECG   Assessment and Plan

## 2013-02-28 NOTE — Assessment & Plan Note (Signed)
Likely due to weight gain and deconditioning However chronic cough worrisome for development of ILD  Will start with cardiopulm stress test to see what limitations she has to exercise  If pulmonary limitations may need f/u CT

## 2013-02-28 NOTE — Assessment & Plan Note (Signed)
Atypical normal ECG previously normal dobutamine echo ( had knee surgery at time and couldn't walk)  See below for w/u

## 2013-02-28 NOTE — Assessment & Plan Note (Signed)
3/6 SEM  No significant valve disease on echo will have to review images as murmur is not hard to hear

## 2013-02-28 NOTE — Patient Instructions (Signed)
Your physician recommends that you schedule a follow-up appointment in: PENDING  CPX RESULTS Your physician recommends that you continue on your current medications as directed. Please refer to the Current Medication list given to you today. Your physician has recommended that you have a cardiopulmonary stress test (CPX). CPX testing is a non-invasive measurement of heart and lung function. It replaces a traditional treadmill stress test. This type of test provides a tremendous amount of information that relates not only to your present condition but also for future outcomes. This test combines measurements of you ventilation, respiratory gas exchange in the lungs, electrocardiogram (EKG), blood pressure and physical response before, during, and following an exercise protocol.

## 2013-03-06 ENCOUNTER — Ambulatory Visit (HOSPITAL_COMMUNITY): Payer: BC Managed Care – PPO | Attending: Cardiovascular Disease

## 2013-03-06 DIAGNOSIS — R0609 Other forms of dyspnea: Secondary | ICD-10-CM | POA: Insufficient documentation

## 2013-03-06 DIAGNOSIS — R079 Chest pain, unspecified: Secondary | ICD-10-CM

## 2013-03-06 DIAGNOSIS — R0989 Other specified symptoms and signs involving the circulatory and respiratory systems: Principal | ICD-10-CM | POA: Insufficient documentation

## 2013-03-08 ENCOUNTER — Encounter: Payer: Self-pay | Admitting: Cardiovascular Disease

## 2013-03-08 ENCOUNTER — Ambulatory Visit (INDEPENDENT_AMBULATORY_CARE_PROVIDER_SITE_OTHER): Payer: BC Managed Care – PPO | Admitting: Cardiovascular Disease

## 2013-03-08 VITALS — BP 141/92 | HR 117 | Ht 60.0 in | Wt 168.0 lb

## 2013-03-08 DIAGNOSIS — R0602 Shortness of breath: Secondary | ICD-10-CM

## 2013-03-08 DIAGNOSIS — R0989 Other specified symptoms and signs involving the circulatory and respiratory systems: Secondary | ICD-10-CM

## 2013-03-08 DIAGNOSIS — Z6379 Other stressful life events affecting family and household: Secondary | ICD-10-CM

## 2013-03-08 DIAGNOSIS — Z638 Other specified problems related to primary support group: Secondary | ICD-10-CM

## 2013-03-08 DIAGNOSIS — E669 Obesity, unspecified: Secondary | ICD-10-CM

## 2013-03-08 DIAGNOSIS — R0600 Dyspnea, unspecified: Secondary | ICD-10-CM

## 2013-03-08 DIAGNOSIS — R0609 Other forms of dyspnea: Secondary | ICD-10-CM

## 2013-03-08 NOTE — Progress Notes (Signed)
Patient ID: [name], female   DOB: Aug 17, 1950, 63 y.o.   MRN: [id number] 63 yo referred by Dr Glori Bickers for chest pain . Last seen over 3 years ago by Dr Percival Spanish for atypical chest pain Had normal dobutamine echo in 2010 She has had URI and cough for over 2 months Pain in chest is atypical right and left sided Can be positional No pleurisy. No sputum or fever. Being Rx for GERD and reflux but cough not better. Dyspnea more marked last two months Has gained about 15 lbs this year. 4 daughters. Oldest Anderson Malta is a patient of mine had congenital heart disease. She is exacerbating her mom with smoking drinking (DUI) divorced and basically self destructing. No documented CAD Dr Earlean Shawl who did recent EGD note murmur Echo done 02/27/13 benign  EF 60-65% no significant valve disease   CPX 2/9 reviewed no ventilator of cardiac limitation to exercise  Still upset about daughter [name]'s destructive lifestyle  Blew a .29 arrested, smoking, and likely anorexic.        ROS: Denies fever, malais, weight loss, blurry vision, decreased visual acuity, cough, sputum, SOB, hemoptysis, pleuritic pain, palpitaitons, heartburn, abdominal pain, melena, lower extremity edema, claudication, or rash.  All other systems reviewed and negative  General: Affect appropriate Healthy:  appears stated age 89: normal Neck supple with no adenopathy JVP normal no bruits no thyromegaly Lungs clear with no wheezing and good diaphragmatic motion Heart:  S1/S2 SEM  murmur, no rub, gallop or click PMI normal Abdomen: benighn, BS positve, no tenderness, no AAA no bruit.  No HSM or HJR Distal pulses intact with no bruits No edema Neuro non-focal Skin warm and dry No muscular weakness   Current Outpatient Prescriptions  Medication Sig Dispense Refill  . albuterol (PROAIR HFA) 108 (90 BASE) MCG/ACT inhaler Inhale 2 puffs into the lungs every 6 (six) hours as needed for wheezing.  1 Inhaler  1  . pantoprazole  (PROTONIX) 40 MG tablet Take 40 mg by mouth 2 (two) times daily.      . pravastatin (PRAVACHOL) 40 MG tablet Take 1 tablet (40 mg total) by mouth daily.  90 tablet  3  . rOPINIRole (REQUIP) 0.5 MG tablet Take 1 tablet (0.5 mg total) by mouth 4 (four) times daily. Or as directed  360 tablet  3  . SYNTHROID 88 MCG tablet TAKE 1 TABLET BY MOUTH ONCE DAILY  90 tablet  3   No current facility-administered medications for this visit.    Allergies  Ritalin  Electrocardiogram:  NSR normal ECG   Assessment and Plan

## 2013-03-08 NOTE — Assessment & Plan Note (Signed)
Asked her to try to get Anderson Malta to come see me.  Also suggested she call behavioral health to see how they can get her to inpatient facility

## 2013-03-08 NOTE — Patient Instructions (Signed)
Your physician recommends that you schedule a follow-up appointment in:   [place] Your physician recommends that you continue on your current medications as directed. Please refer to the Current Medication list given to you today. Your physician has recommended that you have a pulmonary function test. Pulmonary Function Tests are a group of tests that measure how well air moves in and out of your lungs.

## 2013-03-08 NOTE — Assessment & Plan Note (Signed)
Discussed low cal/carb diet and ok to exercise given normal CPX

## 2013-03-08 NOTE — Assessment & Plan Note (Signed)
CPX suggests deconditioning CXR normal Echo normal EF and diastolic parameters.  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

## 2013-03-13 ENCOUNTER — Ambulatory Visit (HOSPITAL_COMMUNITY)
Admission: RE | Admit: 2013-03-13 | Discharge: 2013-03-13 | Disposition: A | Discharge status: Home / Self Care | Payer: BC Managed Care – PPO | Source: Ambulatory Visit | Attending: Cardiovascular Disease | Admitting: Cardiovascular Disease

## 2013-03-13 ENCOUNTER — Encounter: Payer: Self-pay | Admitting: Internal Medicine

## 2013-03-13 ENCOUNTER — Ambulatory Visit (INDEPENDENT_AMBULATORY_CARE_PROVIDER_SITE_OTHER): Payer: BC Managed Care – PPO | Admitting: Internal Medicine

## 2013-03-13 VITALS — BP 142/90 | HR 79 | Temp 98.2°F | Ht 60.0 in | Wt 173.0 lb

## 2013-03-13 DIAGNOSIS — R0609 Other forms of dyspnea: Secondary | ICD-10-CM | POA: Insufficient documentation

## 2013-03-13 DIAGNOSIS — K219 Gastro-esophageal reflux disease without esophagitis: Secondary | ICD-10-CM

## 2013-03-13 DIAGNOSIS — R053 Chronic cough: Secondary | ICD-10-CM

## 2013-03-13 DIAGNOSIS — R0989 Other specified symptoms and signs involving the circulatory and respiratory systems: Principal | ICD-10-CM | POA: Insufficient documentation

## 2013-03-13 DIAGNOSIS — R05 Cough: Secondary | ICD-10-CM

## 2013-03-13 DIAGNOSIS — R030 Elevated blood-pressure reading, without diagnosis of hypertension: Secondary | ICD-10-CM

## 2013-03-13 DIAGNOSIS — R0600 Dyspnea, unspecified: Secondary | ICD-10-CM

## 2013-03-13 DIAGNOSIS — R059 Cough, unspecified: Secondary | ICD-10-CM

## 2013-03-13 LAB — PULMONARY FUNCTION TEST
DL/VA % pred: 98 %
DL/VA: 4.34 ml/min/mmHg/L
DLCO COR % PRED: 81 %
DLCO COR: 16.57 ml/min/mmHg
DLCO UNC % PRED: 81 %
DLCO UNC: 16.57 ml/min/mmHg
FEF 25-75 POST: 2.8 L/s
FEF 25-75 PRE: 2.25 L/s
FEF2575-%Change-Post: 24 %
FEF2575-%PRED-PRE: 108 %
FEF2575-%Pred-Post: 135 %
FEV1-%Change-Post: 5 %
FEV1-%Pred-Post: 105 %
FEV1-%Pred-Pre: 99 %
FEV1-POST: 2.33 L
FEV1-Pre: 2.21 L
FEV1FVC-%CHANGE-POST: -2 %
FEV1FVC-%Pred-Pre: 104 %
FEV6-%CHANGE-POST: 6 %
FEV6-%PRED-POST: 105 %
FEV6-%PRED-PRE: 98 %
FEV6-PRE: 2.73 L
FEV6-Post: 2.92 L
FEV6FVC-%CHANGE-POST: -1 %
FEV6FVC-%PRED-POST: 102 %
FEV6FVC-%Pred-Pre: 104 %
FVC-%CHANGE-POST: 8 %
FVC-%PRED-POST: 103 %
FVC-%Pred-Pre: 95 %
FVC-POST: 2.96 L
FVC-PRE: 2.73 L
POST FEV6/FVC RATIO: 99 %
PRE FEV1/FVC RATIO: 81 %
Post FEV1/FVC ratio: 79 %
Pre FEV6/FVC Ratio: 100 %
RV % pred: 95 %
RV: 1.81 L
TLC % PRED: 104 %
TLC: 4.83 L

## 2013-03-13 LAB — BLOOD GAS, ARTERIAL
ACID-BASE DEFICIT: 0.3 mmol/L (ref 0.0–2.0)
BICARBONATE: 23.7 meq/L (ref 20.0–24.0)
Drawn by: 242311
FIO2: 0.21 %
O2 SAT: 94.5 %
PATIENT TEMPERATURE: 98.6
PH ART: 7.414 (ref 7.350–7.450)
TCO2: 24.8 mmol/L (ref 0–100)
pCO2 arterial: 37.7 mmHg (ref 35.0–45.0)
pO2, Arterial: 70.3 mmHg — ABNORMAL LOW (ref 80.0–100.0)

## 2013-03-13 MED ORDER — ALBUTEROL SULFATE HFA 108 (90 BASE) MCG/ACT IN AERS: 2.0000 | INHALATION_SPRAY | Freq: Four times a day (QID) | RESPIRATORY_TRACT | Status: DC | PRN

## 2013-03-13 MED ORDER — ALBUTEROL SULFATE (2.5 MG/3ML) 0.083% IN NEBU
2.5000 mg | INHALATION_SOLUTION | Freq: Once | RESPIRATORY_TRACT | Status: AC
  Administered 2013-03-13: 2.5 mg via RESPIRATORY_TRACT

## 2013-03-13 MED ORDER — PANTOPRAZOLE SODIUM 40 MG PO TBEC: 40.0000 mg | DELAYED_RELEASE_TABLET | Freq: Two times a day (BID) | ORAL | Status: DC

## 2013-03-13 NOTE — Progress Notes (Signed)
Chief Complaint  Patient presents with  . Follow-up    HPI: Patient comes in today for follow up of  multiple medical problems.   Bu t to fu on BP management / Cough /sob. See last visit :  Readings 130 and higher . 138   Saw dr Earlean Shawl  advised increase to protonix bid and she needs refill   Albuterol needs refill ( lost it)    To get PFTs today  For full evaluation Saw Dr Johnsie Cancel   bp 141/92   CPX suggests deconditioning CXR normal Echo normal EF and diastolic parameters. F/U PFTls with DLCO given asthma history No wheezing on exam  Did spend 600 hours cleaning a hoarders house and may have been exposed to mold Consider pulmonary referral      CP exercise test basically normal  Pos deconditioning  PFTS pending today   ROS: See pertinent positives and negatives per HPI. No changes stress family  Past Medical History  Diagnosis Date  . IBS (irritable bowel syndrome)   . Thyroid goiter     I 131 rx and then  total throidectomy 2005 [facility]  . GERD (gastroesophageal reflux disease)   . Dyspnea     nl PFTs, and echo  . Recurrent oral ulcers   . Heart murmur   . Asthma   . Headache(784.0)   . History of kidney stones   . Nephrolithiasis   . Acute pyelonephritis 05/02/2012    Family History  Problem Relation Age of Onset  . Cancer Mother     bladder  . COPD Sister   . Coronary artery disease Sister     age 40 also copd  . Arthritis Other   . Cancer Other     breast  . Diabetes Other   . Hyperlipidemia Other   . Hypertension Other   . Stroke Other   . Heart disease Other   . Coronary artery disease Father     also had AAA  . Aneurysm Father     History   Social History  . Marital Status: Married    Spouse Name: N/A    Number of Children: N/A  . Years of Education: N/A   Social History Main Topics  . Smoking status: Never Smoker   . Smokeless tobacco: Never Used  . Alcohol Use: Yes     Comment: seldom  . Drug Use: No  . Sexual Activity: None    Other Topics Concern  . None   Social History Narrative   Married   Has grandchildren   Never smoked    G4P4   hh of 2  care of GC ages 16 - 3 month  5 days a week    Outpatient Encounter Prescriptions as of 03/13/2013  Medication Sig  . albuterol (PROAIR HFA) 108 (90 BASE) MCG/ACT inhaler Inhale 2 puffs into the lungs every 6 (six) hours as needed for wheezing.  . pantoprazole (PROTONIX) 40 MG tablet Take 1 tablet (40 mg total) by mouth 2 (two) times daily.  . pravastatin (PRAVACHOL) 40 MG tablet Take 1 tablet (40 mg total) by mouth daily.  Marland Kitchen rOPINIRole (REQUIP) 0.5 MG tablet Take 1 tablet (0.5 mg total) by mouth 4 (four) times daily. Or as directed  . SYNTHROID 88 MCG tablet TAKE 1 TABLET BY MOUTH ONCE DAILY  . [DISCONTINUED] albuterol (PROAIR HFA) 108 (90 BASE) MCG/ACT inhaler Inhale 2 puffs into the lungs every 6 (six) hours as needed for wheezing.  . [DISCONTINUED]  pantoprazole (PROTONIX) 40 MG tablet Take 40 mg by mouth 2 (two) times daily.    EXAM:  BP 142/90  Pulse 79  Temp(Src) 98.2 F (36.8 C) (Oral)  Ht 5' (1.524 m)  Wt 173 lb (78.472 kg)  BMI 33.79 kg/m2  SpO2 97%  Body mass index is 33.79 kg/(m^2).  GENERAL: vitals reviewed and listed above, alert, oriented, appears well hydrated and in no acute distress HEENT: atraumatic, conjunctiva  clear, no obvious abnormalities on inspection of external nose and ear socc dry cough CV: HRRR, no clubbing cyanosis or  peripheral edema nl cap refill  MS: moves all extremities without noticeable focal  abnormality PSYCH: pleasant and cooperative, no obvious depression or anxiety ASSESSMENT AND PLAN:  Discussed the following assessment and plan:  Elevated blood pressure reading - " pre ht readings at least" consdier med interevention, echo negative  lsi and dash also   Esophageal reflux  Cough, persistent - under eval poss elr getting pfts  bp  Range 130s to high 140 range seemingly   with 88- 92 diastolic Hast been  doing exercise because of concern about sx but now is cleared to do so .  Will intensify lsi.  Consider adding arb ( not acei cause of cough)  Weight loss will help all of the above.  Stress factors.    -Patient advised to return or notify health care team  if symptoms worsen or persist or new concerns arise.  Patient Instructions  Continue lifestyle intervention healthy eating and exercise . Dash diet .  bp readings seem to be 140 range  . Consider other intervention.  Will flag dr Johnsie Cancel  Consider adding  a low dose ace receptor blocker   . Either way ROV in 3-4 months.   DASH Diet The DASH diet stands for "Dietary Approaches to Stop Hypertension." It is a healthy eating plan that has been shown to reduce high blood pressure (hypertension) in as little as 14 days, while also possibly providing other significant health benefits. These other health benefits include reducing the risk of breast cancer after menopause and reducing the risk of type 2 diabetes, heart disease, colon cancer, and stroke. Health benefits also include weight loss and slowing kidney failure in patients with chronic kidney disease.  DIET GUIDELINES  Limit salt (sodium). Your diet should contain less than 1500 mg of sodium daily.  Limit refined or processed carbohydrates. Your diet should include mostly whole grains. Desserts and added sugars should be used sparingly.  Include small amounts of heart-healthy fats. These types of fats include nuts, oils, and tub margarine. Limit saturated and trans fats. These fats have been shown to be harmful in the body. CHOOSING FOODS  The following food groups are based on a 2000 calorie diet. See your Registered Dietitian for individual calorie needs. Grains and Grain Products (6 to 8 servings daily)  Eat More Often: Whole-wheat bread, brown rice, whole-grain or wheat pasta, quinoa, popcorn without added fat or salt (air popped).  Eat Less Often: White bread, white pasta, white  rice, cornbread. Vegetables (4 to 5 servings daily)  Eat More Often: Fresh, frozen, and canned vegetables. Vegetables may be raw, steamed, roasted, or grilled with a minimal amount of fat.  Eat Less Often/Avoid: Creamed or fried vegetables. Vegetables in a cheese sauce. Fruit (4 to 5 servings daily)  Eat More Often: All fresh, canned (in natural juice), or frozen fruits. Dried fruits without added sugar. One hundred percent fruit juice ( cup [237 mL]  daily).  Eat Less Often: Dried fruits with added sugar. Canned fruit in light or heavy syrup. [employer]UM[employer], Fish, and Poultry (2 servings or less daily. One serving is 3 to 4 oz [85-114 g]).  Eat More Often: Ninety percent or leaner ground beef, tenderloin, sirloin. Round cuts of beef, chicken breast, [place] breast. All fish. Grill, bake, or broil your meat. Nothing should be fried.  Eat Less Often/Avoid: Fatty cuts of meat, [place], or chicken leg, thigh, or wing. Fried cuts of meat or fish. Dairy (2 to 3 servings)  Eat More Often: Low-fat or fat-free milk, low-fat plain or light yogurt, reduced-fat or part-skim cheese.  Eat Less Often/Avoid: Milk (whole, 2%).Whole milk yogurt. Full-fat cheeses. Nuts, Seeds, and Legumes (4 to 5 servings per week)  Eat More Often: All without added salt.  Eat Less Often/Avoid: Salted nuts and seeds, canned beans with added salt. Fats and Sweets (limited)  Eat More Often: Vegetable oils, tub margarines without trans fats, sugar-free gelatin. Mayonnaise and salad dressings.  Eat Less Often/Avoid: Coconut oils, palm oils, butter, stick margarine, cream, half and half, cookies, candy, pie. FOR MORE INFORMATION The Dash Diet Eating Plan: www.dashdiet.org Document Released: 01/01/2011 Document Revised: 04/06/2011 Document Reviewed: 01/01/2011 [facility] Patient Information 2014 [place], [place].    Standley Brooking. Panosh M.D.  Pre visit review using our clinic review tool, if applicable. No additional management  support is needed unless otherwise documented below in the visit note.

## 2013-03-13 NOTE — Patient Instructions (Signed)
Continue lifestyle intervention healthy eating and exercise . Dash diet .  bp readings seem to be 140 range  . Consider other intervention.  Will flag dr Johnsie Cancel  Consider adding  a low dose ace receptor blocker   . Either way ROV in 3-4 months.   DASH Diet The DASH diet stands for "Dietary Approaches to Stop Hypertension." It is a healthy eating plan that has been shown to reduce high blood pressure (hypertension) in as little as 14 days, while also possibly providing other significant health benefits. These other health benefits include reducing the risk of breast cancer after menopause and reducing the risk of type 2 diabetes, heart disease, colon cancer, and stroke. Health benefits also include weight loss and slowing kidney failure in patients with chronic kidney disease.  DIET GUIDELINES  Limit salt (sodium). Your diet should contain less than 1500 mg of sodium daily.  Limit refined or processed carbohydrates. Your diet should include mostly whole grains. Desserts and added sugars should be used sparingly.  Include small amounts of heart-healthy fats. These types of fats include nuts, oils, and tub margarine. Limit saturated and trans fats. These fats have been shown to be harmful in the body. CHOOSING FOODS  The following food groups are based on a 2000 calorie diet. See your Registered Dietitian for individual calorie needs. Grains and Grain Products (6 to 8 servings daily)  Eat More Often: Whole-wheat bread, brown rice, whole-grain or wheat pasta, quinoa, popcorn without added fat or salt (air popped).  Eat Less Often: White bread, white pasta, white rice, cornbread. Vegetables (4 to 5 servings daily)  Eat More Often: Fresh, frozen, and canned vegetables. Vegetables may be raw, steamed, roasted, or grilled with a minimal amount of fat.  Eat Less Often/Avoid: Creamed or fried vegetables. Vegetables in a cheese sauce. Fruit (4 to 5 servings daily)  Eat More Often: All fresh,  canned (in natural juice), or frozen fruits. Dried fruits without added sugar. One hundred percent fruit juice ( cup [237 mL] daily).  Eat Less Often: Dried fruits with added sugar. Canned fruit in light or heavy syrup. [employer]UM[employer]ands, Fish, and Poultry (2 servings or less daily. One serving is 3 to 4 oz [85-114 g]).  Eat More Often: Ninety percent or leaner ground beef, tenderloin, sirloin. Round cuts of beef, chicken breast, [place] breast. All fish. Grill, bake, or broil your meat. Nothing should be fried.  Eat Less Often/Avoid: Fatty cuts of meat, [place], or chicken leg, thigh, or wing. Fried cuts of meat or fish. Dairy (2 to 3 servings)  Eat More Often: Low-fat or fat-free milk, low-fat plain or light yogurt, reduced-fat or part-skim cheese.  Eat Less Often/Avoid: Milk (whole, 2%).Whole milk yogurt. Full-fat cheeses. Nuts, Seeds, and Legumes (4 to 5 servings per week)  Eat More Often: All without added salt.  Eat Less Often/Avoid: Salted nuts and seeds, canned beans with added salt. Fats and Sweets (limited)  Eat More Often: Vegetable oils, tub margarines without trans fats, sugar-free gelatin. Mayonnaise and salad dressings.  Eat Less Often/Avoid: Coconut oils, palm oils, butter, stick margarine, cream, half and half, cookies, candy, pie. FOR MORE INFORMATION The Dash Diet Eating Plan: www.dashdiet.org Document Released: 01/01/2011 Document Revised: 04/06/2011 Document Reviewed: 01/01/2011 [facility] Patient Information 2014 [place], [place].

## 2013-03-17 ENCOUNTER — Telehealth: Payer: Self-pay | Admitting: Internal Medicine

## 2013-03-17 ENCOUNTER — Other Ambulatory Visit: Payer: Self-pay | Admitting: Family Medicine

## 2013-03-17 MED ORDER — OSELTAMIVIR PHOSPHATE 75 MG PO CAPS
75.0000 mg | ORAL_CAPSULE | Freq: Two times a day (BID) | ORAL | Status: DC
Start: 2013-03-17 — End: 2013-06-05

## 2013-03-17 NOTE — Telephone Encounter (Signed)
Pt called back to f/u on the tamiflu request.

## 2013-03-17 NOTE — Telephone Encounter (Signed)
Spoke to the pt.  She did want Tamiflu as a precaution since she has been in the hospital several times recently for testing.  Sx started yesterday.  Has had a fever of 100.5 today along with chills, body ache, nasal congestion and head ache.  Notified her that I sent it to the pharmacy.  Also made sure the pt has a follow up appt for bp.  She will keep a record of her readings.

## 2013-03-17 NOTE — Telephone Encounter (Signed)
Pt would like to know if dr would send in rx tamiflu. Pt began to have symptoms yesterday. (headache,chills, low grade fever,runny nose, body aches)  Pt has traveled in out out of hospital for test 3 x in this past week.  [facility] Pt just seen Mon by dr Regis Bill and prefers not to have to come back in.

## 2013-03-17 NOTE — Telephone Encounter (Signed)
Please have a clinical person talks and confirm her symptoms   Ok to  Treat but  Medication can cause HA NV and side effects . If she really thinks its flu with fever can   Begin medication .  tamiflu 75 1 po bid for 5 days disp 10    Also tell her i touched base with  Dr Johnsie Cancel  we can go either way onadding blood pressure meds  Lets follow her readings and recheck in 3-4 months and if not better consider meds at that time .

## 2013-03-22 ENCOUNTER — Telehealth: Payer: Self-pay | Admitting: *Deleted

## 2013-03-22 NOTE — Telephone Encounter (Signed)
[place], [name] ','<More Detail >>       Josue Hector, MD       Sent: Mon March 20, 2013 8:47 PM    To: Richmond Campbell, LPN                   Message     Normal CPX        ----- Message -----    From: Richmond Campbell, LPN    Sent: 06/13/8414 3:08 PM    To: Josue Hector, MD        NOT Starr Sinclair YOU REVIEWED./CY                          Results Cardiopulmonary exercise test (Order [id number])      Cardiopulmonary exercise test   Status: Preliminary result Visible to patient: This result is not viewable by the patient. Next appt: 06/05/2013 at 08:00 AM in Cardiology Jenkins Rouge, MD) Dx: Chest pain             Result Narrative      Referred for: Dyspnea and Chest pain  Procedure: This patient underwent staged symptom-limited exercise treadmill testing using a modified Naughton protocol with expired gas analysis metabolic evaluation during exercise.  Demographics  Age: 63 Ht. (in.) 60 Wt. (lb) 168.5 BMI: 32.9 Predicted Peak VO2: 19.4 ml/kg/min  Gender: Female Ht (cm) 152.4 Wt. (kg) 76.4    Results  Pre-Exercise PFTs   FVC 2.69 (97%)   FEV1 2.21 (104%)   FEV1/FVC 82%   MVV 92 (112%)  Post-Exercise PFTs (from lowest post-exercise trial (%change from rest))  FVC 2.89 (+7%)   FEV1 2.30 (+3%)   FEV1/FVC 79%    Exercise Time: 12:00 Speed (mph): 3.0 Grade (%): 7.5    RPE: 16  Reason stopped: Patient stopped due to dyspnea (5/10) and chest pressure (5/10)  Additional symptoms: Leg fatigue  Resting HR: 62 Peak HR: 146 (92% age predicted max HR)  BP rest: 130/78 BP peak: 188/76  Peak VO2: 21 (108.2% predicted peak VO2)  VE/VCO2 slope: 30.7  OUES: 1.58  Peak RER: 1.21  Ventilatory Threshold: 15.5 (73.8% measured peak VO2)  Peak RR 43  Peak Ventilation: 62.4  VE/MVV: 67.4%  PETCO2 at peak: 35  O2pulse: 11 (122% predicted O2pulse)   Interpretation  Notes: Patient gave a very good  effort. The pulse-oximetry remained at 96% or greater throughout the exercise.   ECG: Resting ECG in normal sinus rhythm. There was an adequate HR response to the exercise with ST-T changes or arrhythmias. The BP response was appropriate.  PFT: Resting spirometry within normal limits with a significant reduction in expiratory reserve volume (not reported above) of 70 ml (~9% of predicted) which is consistent with her body habitus. The MVV was normal. Post-exercise spirometry was performed IPE, 5 (reported above) and 10 min of recovery with no trend to suggest EIB or respiratory muscle fatigue.   CPX: Exercise testing with gas exchange demonstrates a normal peak VO2 of 21 ml/kg/min (108% of the age/gender/weight matched sedentary norms). The RER of 1.21 indicates a maximal effort. When adjusted to the patient's ideal body weight of 124 lb (56.3 kg) the peak VO2 is 28.5 ml/kg (ibw)/min (119% of the ibw-adjusted predicted). The VE/VCO2 slope is at the upper limit normal. The oxygen uptake efficiency slope (OUES) is normal and reflective of the patient's measured functional capacity. The VO2  at the ventilatory threshold was excellent at 74% of the measured peak VO2. At peak exercise, the ventilation reached 67% of the measured MVV indicating ventilatory reserve remained (respiratory rate was within the expected range, Vt/IC [56%] was just below the expected range, PETCO2 was normal). The O2pulse (a surrogate for stroke volume) increased throughout the exercise reaching 11 ml/beat (122% of predicted).   Conclusion: Exercise testing with gas exchange demonstrates a normal functional capacity when compared to matched sedentary norms. There was not a clear circulatory or ventilatory limitation to the exercise. ^^^Preliminary CPX Results, Finalized results will be forwarded when completed by interpreting physician.^^^  Report prepared by: Linzie Collin, PhD, ACSM-RCEP Sr. Exercise  Physiologist 03/06/2013 10:49 AM          Last Resulted: 03/06/13 10:23 AM           Results      Document on 03/06/2013 10:24 AM by Linzie Collin, RCEP : [name] [phone]).pdf                 Result Information     Status     Preliminary result (03/06/2013 10:23 AM)     Provider Status: Open    Encounter      View Encounter                Results     Document on 03/06/2013 10:24 AM by Linzie Collin, RCEP : [name] [id number]          Order-Level Documents:     There are no order-level documents.         Cardiopulmonary exercise test (Order [id number]) Released By: Automatic Release User Date: 03/06/2013  Procedures Authorizing: Josue Hector, MD Department: [facility]  Order: [id number]         Josue Hector, MD  NPI: [id number]         Order Information     Order Date/Time Release Date/Time Start Date/Time End Date/Time    02/28/13 04:14 PM 03/06/13 07:57 AM 03/06/13 07:57 AM None           Order Details     Frequency Duration Priority Order Class    None None Routine Ancillary Performed           Order History Outpatient     Date/Time Action Taken User Additional Information    03/06/13 0757 Release Larena Sox From Order: [id number]    03/06/13 1023 Result Linzie Collin, RCEP Preliminary           Associated Diagnoses     Chest pain [786.50]           Appointments for this Order     03/06/2013 8:30 AM - 120 min [facility] ([facility]) [facility]           Verbal Order Info     Action Created on Order Mode Communicator Responsible Provider Signed by Signed on    Ordering 02/28/13 1614 Verbal with readback Richmond Campbell, LPN Josue Hector, MD Josue Hector, MD 03/01/13 [phone]           Patient Information     Patient Name Sex DOB SSN    [name], [name] Female 02-Mar-1950 [id number]           Additional Information     Associated Reports    View Parent Encounter    Priority  and Order Details  Order Provider Info       Office phone Pager/beeper E-mail    [place], LPN [phone] -- [email or web address]    Authorizing Provider Josue Hector, MD [phone] -- [email or web address]    Attending Provider Josue Hector, MD [phone] -- [email or web address]    Billing Provider Josue Hector, MD [phone] -- [email or web address]            Encounter     View Encounter            Order-Level Documents:     There are no order-level documents.         PT AWARE OF  CPX RESULTS .[name]

## 2013-06-05 ENCOUNTER — Ambulatory Visit (INDEPENDENT_AMBULATORY_CARE_PROVIDER_SITE_OTHER): Payer: BC Managed Care – PPO | Admitting: Cardiovascular Disease

## 2013-06-05 ENCOUNTER — Telehealth: Payer: Self-pay | Admitting: Internal Medicine

## 2013-06-05 ENCOUNTER — Encounter: Payer: Self-pay | Admitting: Cardiovascular Disease

## 2013-06-05 VITALS — BP 138/80 | HR 93 | Ht 60.0 in | Wt 171.0 lb

## 2013-06-05 DIAGNOSIS — E039 Hypothyroidism, unspecified: Secondary | ICD-10-CM

## 2013-06-05 DIAGNOSIS — R7309 Other abnormal glucose: Secondary | ICD-10-CM

## 2013-06-05 DIAGNOSIS — R7981 Abnormal blood-gas level: Secondary | ICD-10-CM

## 2013-06-05 DIAGNOSIS — R0602 Shortness of breath: Secondary | ICD-10-CM

## 2013-06-05 DIAGNOSIS — R0789 Other chest pain: Secondary | ICD-10-CM

## 2013-06-05 DIAGNOSIS — R7303 Prediabetes: Secondary | ICD-10-CM

## 2013-06-05 DIAGNOSIS — E7800 Pure hypercholesterolemia, unspecified: Secondary | ICD-10-CM

## 2013-06-05 NOTE — Progress Notes (Signed)
Patient ID: [name], female   DOB: December 22, 1950, 63 y.o.   MRN: [id number] 63 yo referred by [name] for chest pain . Last seen over 3 years ago by [name] for atypical chest pain Had normal dobutamine echo in 2010 She has had URI and cough for over 2 months Pain in chest is atypical right and left sided Can be positional No pleurisy. No sputum or fever. Being Rx for GERD and reflux but cough not better. Dyspnea more marked last two months Has gained about 15 lbs this year. 4 daughters. Oldest [name] is a patient of mine had congenital heart disease. She is exacerbating her mom with smoking drinking (DUI) divorced and basically self destructing. No documented CAD [name] who did recent EGD note murmur  Echo done 02/27/13 benign  EF 60-65% no significant valve disease   CPX 2/9 reviewed no ventilator of cardiac limitation to exercise  CXR: no CE normal  PFT;s normal lung volumes DLCO and spirmetry   Room Air blood gas a bit low at 70  Still upset about daughter [name]'s destructive lifestyle Blew a .29 arrested, smoking, and likely anorexic.   Still with some dyspnea and mild non productive cough   ROS: Denies fever, malais, weight loss, blurry vision, decreased visual acuity, cough, sputum, SOB, hemoptysis, pleuritic pain, palpitaitons, heartburn, abdominal pain, melena, lower extremity edema, claudication, or rash.  All other systems reviewed and negative  General: Affect appropriate Healthy:  appears stated age 6: normal Neck supple with no adenopathy JVP normal no bruits no thyromegaly Lungs clear with no wheezing and good diaphragmatic motion Heart:  S1/S2 no murmur, no rub, gallop or click PMI normal Abdomen: benighn, BS positve, no tenderness, no AAA no bruit.  No HSM or HJR Distal pulses intact with no bruits No edema Neuro non-focal Skin warm and dry No muscular weakness   Current Outpatient Prescriptions  Medication Sig Dispense Refill  . albuterol  (PROAIR HFA) 108 (90 BASE) MCG/ACT inhaler Inhale 2 puffs into the lungs every 6 (six) hours as needed for wheezing.  1 Inhaler  1  . pantoprazole (PROTONIX) 40 MG tablet Take 1 tablet (40 mg total) by mouth 2 (two) times daily.  180 tablet  3  . pravastatin (PRAVACHOL) 40 MG tablet Take 1 tablet (40 mg total) by mouth daily.  90 tablet  3  . rOPINIRole (REQUIP) 0.5 MG tablet Take 1 tablet (0.5 mg total) by mouth 4 (four) times daily. Or as directed  360 tablet  3  . SYNTHROID 88 MCG tablet TAKE 1 TABLET BY MOUTH ONCE DAILY  90 tablet  3   No current facility-administered medications for this visit.    Allergies  Ritalin  Electrocardiogram:  SR rate 64  RSR' otherwise normal   Assessment and Plan

## 2013-06-05 NOTE — Assessment & Plan Note (Signed)
Resolved Atypical Normal ECG and cardiopulmonary stress test

## 2013-06-05 NOTE — Assessment & Plan Note (Signed)
Cholesterol is at goal.  Continue current dose of statin and diet Rx.  No myalgias or side effects.  F/U  LFT's in 6 months. Lab Results  Component Value Date   LDLCALC 118* 01/13/2013

## 2013-06-05 NOTE — Assessment & Plan Note (Signed)
Continue replacement post thyroidectomy TSH normal

## 2013-06-05 NOTE — Assessment & Plan Note (Signed)
Non cardiac  F/U Dr Regis Bill consider CT and pulmonary referral

## 2013-06-05 NOTE — Patient Instructions (Signed)
Your physician recommends that you schedule a follow-up appointment in: AS NEEDED  Your physician recommends that you continue on your current medications as directed. Please refer to the Current Medication list given to you today.  

## 2013-06-05 NOTE — Telephone Encounter (Signed)
Pt saw dr Johnsie Cancel today and he recommended her to talk with dr Regis Bill concerning a referral to pulmonologist because her oxygen levels are down. Pt decline appt. Pt has appt next month

## 2013-06-05 NOTE — Assessment & Plan Note (Signed)
Cholesterol is at goal.  Continue current dose of statin and diet Rx.  No myalgias or side effects.  F/U  LFT's in 6 months. Lab Results  Component Value Date   LDLCALC 118* 01/13/2013             

## 2013-06-06 NOTE — Telephone Encounter (Signed)
Please contact patient   Ok if she wants to wait until next month  Or do referral if she wants before then

## 2013-06-08 NOTE — Telephone Encounter (Signed)
Spoke to the pt.  She wanted to proceed with referral to pulmonology.  Order placed in the system.

## 2013-06-15 ENCOUNTER — Ambulatory Visit (INDEPENDENT_AMBULATORY_CARE_PROVIDER_SITE_OTHER)
Admission: RE | Admit: 2013-06-15 | Discharge: 2013-06-15 | Disposition: A | Discharge status: Home / Self Care | Payer: BC Managed Care – PPO | Source: Ambulatory Visit | Attending: Internal Medicine | Admitting: Internal Medicine

## 2013-06-15 ENCOUNTER — Encounter: Payer: Self-pay | Admitting: Internal Medicine

## 2013-06-15 ENCOUNTER — Ambulatory Visit (INDEPENDENT_AMBULATORY_CARE_PROVIDER_SITE_OTHER): Payer: BC Managed Care – PPO | Admitting: Internal Medicine

## 2013-06-15 VITALS — BP 130/78 | HR 82 | Ht 60.0 in | Wt 171.0 lb

## 2013-06-15 DIAGNOSIS — R058 Other specified cough: Secondary | ICD-10-CM

## 2013-06-15 DIAGNOSIS — R05 Cough: Secondary | ICD-10-CM

## 2013-06-15 DIAGNOSIS — R059 Cough, unspecified: Secondary | ICD-10-CM

## 2013-06-15 MED ORDER — TRAMADOL HCL 50 MG PO TABS: ORAL_TABLET | ORAL | Status: DC

## 2013-06-15 MED ORDER — PREDNISONE 10 MG PO TABS: ORAL_TABLET | ORAL | Status: DC

## 2013-06-15 NOTE — Patient Instructions (Addendum)
The key to effective treatment for your cough is eliminating the non-stop cycle of cough you're stuck in long enough to let your airway heal completely and then see if there is anything still making you cough once you stop the cough suppression, but this should take no more than 5 days to figure out  First take delsym two tsp every 12 hours and supplement if needed with  tramadol 50 mg up to 2 every 4 hours to suppress the urge to cough at all or even clear your throat. Swallowing water or using ice chips/non mint and menthol containing candies (such as lifesavers or sugarless jolly ranchers) are also effective.  You should rest your voice and avoid activities that you know make you cough.  Once you have eliminated the cough for 3 straight days try reducing the tramadol first,  then the delsym as tolerated.    Prednisone 10 mg take  4 each am x 2 days,   2 each am x 2 days,  1 each am x 2 days and stop (this is to eliminate allergies and inflammation from coughing)  protonix should be Take 30- 60 min before your first and last meals of the day   GERD (REFLUX)  is an extremely common cause of respiratory symptoms, many times with no significant heartburn at all.    It can be treated with medication, but also with lifestyle changes including avoidance of late meals, excessive alcohol, smoking cessation, and avoid fatty foods, chocolate, peppermint, colas, red wine, and acidic juices such as orange juice.  NO MINT OR MENTHOL PRODUCTS SO NO COUGH DROPS  USE HARD CANDY INSTEAD (jolley ranchers or Stover's or Lifesavers (all available in sugarless versions) NO OIL BASED VITAMINS - use powdered substitutes.  If not better in one week return for follow up

## 2013-06-15 NOTE — Assessment & Plan Note (Signed)

## 2013-06-15 NOTE — Progress Notes (Signed)
Quick Note:  Spoke with pt and notified of results per Dr. Wert. Pt verbalized understanding and denied any questions.  ______ 

## 2013-06-15 NOTE — Progress Notes (Signed)
   Subjective:    Patient ID: [name], female    DOB: [birth year]   MRN: [id number]  HPI  34 yowf never smoker with ? Asthma as child but no memory then ? Asthma with 2nd child 1987 no inhalers then early Oct 2014 developed daily cough and sob referred 06/15/2013 by Dr Regis Bill to pulmonary clinic after neg cardiac w/u by Dr Johnsie Cancel than included cpst.   06/15/2013 1st [facility] office visit/ [name]  Chief Complaint  Patient presents with  . Advice Only    Referred by Dr. Regis Bill for SOB, prod cough with white-yellow mucous X7 months.  cough is more day than night came on abruptly sev weeks after finished cleaning [employer] [place]  Doe even s cough x 50 ft sometimes has not tried inhaler - not consistent and not reproduced on CPST   No obvious other patterns in day to day or daytime variabilty or assoc  cp or chest tightness, subjective wheeze overt sinus or hb symptoms. No unusual exp hx or h/o childhood pna/ asthma or knowledge of premature birth.  Sleeping ok without nocturnal  or early am exacerbation  of respiratory  c/o's or need for noct saba. Also denies any obvious fluctuation of symptoms with weather or environmental changes or other aggravating or alleviating factors except as outlined above   Current Medications, Allergies, Complete Past Medical History, Past Surgical History, Family History, and Social History were reviewed in [employer] record.             Review of Systems  Constitutional: Negative for fever and unexpected weight change.  HENT: Positive for congestion. Negative for dental problem, ear pain, nosebleeds, postnasal drip, rhinorrhea, sinus pressure, sneezing, sore throat and trouble swallowing.   Eyes: Negative for redness and itching.  Respiratory: Positive for cough and shortness of breath. Negative for chest tightness and wheezing.   Cardiovascular: Negative for palpitations and leg swelling.  Gastrointestinal: Negative  for nausea and vomiting.  Genitourinary: Negative for dysuria.  Musculoskeletal: Negative for joint swelling.  Skin: Negative for rash.  Neurological: Negative for headaches.  Hematological: Does not bruise/bleed easily.  Psychiatric/Behavioral: Negative for dysphoric mood. The patient is not nervous/anxious.        Objective:   Physical Exam  Obese amb with harsh barking cough   Wt Readings from Last 3 Encounters:  06/15/13 171 lb (77.565 kg)  06/05/13 171 lb (77.565 kg)  03/13/13 173 lb (78.472 kg)      HEENT: nl dentition, turbinates, and orophanx. Nl external ear canals without cough reflex   NECK :  without JVD/Nodes/TM/ nl carotid upstrokes bilaterally   LUNGS: no acc muscle use, clear to A and P bilaterally without cough on insp or exp maneuvers   CV:  RRR  no s3 or murmur or increase in P2, no edema   ABD:  soft and nontender with nl excursion in the supine position. No bruits or organomegaly, bowel sounds nl  MS:  warm without deformities, calf tenderness, cyanosis or clubbing  SKIN: warm and dry without lesions    NEURO:  alert, approp, no deficits       CXR  06/15/2013 :  No active cardiopulmonary disease.        Assessment & Plan:

## 2013-06-26 ENCOUNTER — Encounter: Payer: Self-pay | Admitting: Internal Medicine

## 2013-06-26 ENCOUNTER — Ambulatory Visit (INDEPENDENT_AMBULATORY_CARE_PROVIDER_SITE_OTHER): Payer: BC Managed Care – PPO | Admitting: Internal Medicine

## 2013-06-26 VITALS — BP 154/92 | HR 67 | Temp 98.9°F | Ht 60.0 in | Wt 170.0 lb

## 2013-06-26 DIAGNOSIS — R030 Elevated blood-pressure reading, without diagnosis of hypertension: Secondary | ICD-10-CM

## 2013-06-26 DIAGNOSIS — Z638 Other specified problems related to primary support group: Secondary | ICD-10-CM

## 2013-06-26 DIAGNOSIS — F439 Reaction to severe stress, unspecified: Secondary | ICD-10-CM | POA: Insufficient documentation

## 2013-06-26 DIAGNOSIS — Z6379 Other stressful life events affecting family and household: Secondary | ICD-10-CM

## 2013-06-26 DIAGNOSIS — I10 Essential (primary) hypertension: Secondary | ICD-10-CM

## 2013-06-26 DIAGNOSIS — Z733 Stress, not elsewhere classified: Secondary | ICD-10-CM

## 2013-06-26 MED ORDER — VALSARTAN 80 MG PO TABS: 80.0000 mg | ORAL_TABLET | Freq: Every day | ORAL | Status: DC

## 2013-06-26 NOTE — Patient Instructions (Signed)
bp is still up   Add arb  monitor bp  Readings to make sure not too low.  Implement  Cough rx  When possible.   ROV in 2 months or as needed .

## 2013-06-26 NOTE — Progress Notes (Signed)
Chief Complaint  Patient presents with  . Follow-up    bp etc    HPI: Fu of mmi   Dr wert dc upper airway cough syndrome  Unable to complete the  meds yet  Just had daughter commited to [place] for etoh and SUDetc  stress Dr Johnsie Cancel felt no active cv cause of atypical cp   To remain on statin bp may be creeping up. No new sx  BP Readings from Last 3 Encounters:  06/26/13 154/92  06/15/13 130/78  06/05/13 138/80    ROS: See pertinent positives and negatives per HPI.cough as prev   Past Medical History  Diagnosis Date  . IBS (irritable bowel syndrome)   . Thyroid goiter     I 131 rx and then  total throidectomy 2005 [facility]  . GERD (gastroesophageal reflux disease)   . Dyspnea     nl PFTs, and echo  . Recurrent oral ulcers   . Heart murmur   . Asthma   . Headache(784.0)   . History of kidney stones   . Nephrolithiasis   . Acute pyelonephritis 05/02/2012    Family History  Problem Relation Age of Onset  . Cancer Mother     bladder  . COPD Sister   . Coronary artery disease Sister     age 62 also copd  . Arthritis Other   . Cancer Other     breast  . Diabetes Other   . Hyperlipidemia Other   . Hypertension Other   . Stroke Other   . Heart disease Other   . Coronary artery disease Father     also had AAA  . Aneurysm Father     History   Social History  . Marital Status: Married    Spouse Name: N/A    Number of Children: N/A  . Years of Education: N/A   Social History Main Topics  . Smoking status: Never Smoker   . Smokeless tobacco: Never Used  . Alcohol Use: Yes     Comment: seldom  . Drug Use: No  . Sexual Activity: None   Other Topics Concern  . 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 acetaminophen (TYLENOL) 500 MG tablet Take 1,000 mg by mouth every 6 (six) hours as  needed.  . pantoprazole (PROTONIX) 40 MG tablet Take 1 tablet (40 mg total) by mouth 2 (two) times daily.  . pravastatin (PRAVACHOL) 40 MG tablet Take 1 tablet (40 mg total) by mouth daily.  Marland Kitchen rOPINIRole (REQUIP) 0.5 MG tablet Take 1 tablet (0.5 mg total) by mouth 4 (four) times daily. Or as directed  . SYNTHROID 88 MCG tablet TAKE 1 TABLET BY MOUTH ONCE DAILY  . traMADol (ULTRAM) 50 MG tablet 1-2 every 4 hours as needed for cough or pain  . predniSONE (DELTASONE) 10 MG tablet Take  4 each am x 2 days,   2 each am x 2 days,  1 each am x 2 days and stop  . valsartan (DIOVAN) 80 MG tablet Take 1 tablet (80 mg total) by mouth daily.    EXAM:  BP 154/92  Pulse 67  Temp(Src) 98.9 F (37.2 C) (Oral)  Ht 5' (1.524 m)  Wt 170 lb (77.111  kg)  BMI 33.20 kg/m2  SpO2 95%  Body mass index is 33.2 kg/(m^2). Bp confirmed 148 and 152.systolic 88 90 diastolic  GENERAL: vitals reviewed and listed above, alert, oriented, appears well hydrated and in no acute distress throat clearing at times  HEENT: atraumatic, conjunctiva  clear, no obvious abnormalities on inspection of external nose and ears  PSYCH: pleasant and cooperative, no obvious depression or anxiety appropriate for the situation   ASSESSMENT AND PLAN:  Discussed the following assessment and plan:  Elevated blood pressure reading  Unspecified essential hypertension  Stress  Stressful life event affecting family Continued elevation   Begin low dose arb ( avoid acei cause of cough)  Fu in 2 months or so with plan for lab monitoring at that time ( bmp) Add one med at a time . Stress  A factor but   Readings persistently elevated recently -Patient advised to return or notify health care team  if symptoms worsen ,persist or new concerns arise.  Patient Instructions  bp is still up   Add arb  monitor bp  Readings to make sure not too low.  Implement  Cough rx  When possible.   ROV in 2 months or as needed .  Standley Brooking. Jarin Cornfield  M.D.  Pre visit review using our clinic review tool, if applicable. No additional management support is needed unless otherwise documented below in the visit note. Total visit 40mins > 50% spent counseling and coordinating care

## 2013-06-27 ENCOUNTER — Telehealth: Payer: Self-pay | Admitting: Internal Medicine

## 2013-06-27 NOTE — Telephone Encounter (Signed)
Relevant patient education assigned to patient using Emmi. ° °

## 2013-07-16 ENCOUNTER — Emergency Department (HOSPITAL_COMMUNITY): Payer: BC Managed Care – PPO

## 2013-07-16 ENCOUNTER — Emergency Department (HOSPITAL_COMMUNITY)
Admission: EM | Admit: 2013-07-16 | Discharge: 2013-07-16 | Disposition: A | Discharge status: Home / Self Care | Payer: BC Managed Care – PPO | Attending: Emergency Medicine | Admitting: Emergency Medicine

## 2013-07-16 ENCOUNTER — Encounter (HOSPITAL_COMMUNITY): Payer: Self-pay | Admitting: Emergency Medicine

## 2013-07-16 DIAGNOSIS — Y9229 Other specified public building as the place of occurrence of the external cause: Secondary | ICD-10-CM | POA: Diagnosis not present

## 2013-07-16 DIAGNOSIS — M542 Cervicalgia: Secondary | ICD-10-CM

## 2013-07-16 DIAGNOSIS — M5489 Other dorsalgia: Secondary | ICD-10-CM

## 2013-07-16 DIAGNOSIS — Z87442 Personal history of urinary calculi: Secondary | ICD-10-CM | POA: Insufficient documentation

## 2013-07-16 DIAGNOSIS — J45909 Unspecified asthma, uncomplicated: Secondary | ICD-10-CM | POA: Insufficient documentation

## 2013-07-16 DIAGNOSIS — S8990XA Unspecified injury of unspecified lower leg, initial encounter: Secondary | ICD-10-CM | POA: Diagnosis not present

## 2013-07-16 DIAGNOSIS — R011 Cardiac murmur, unspecified: Secondary | ICD-10-CM | POA: Insufficient documentation

## 2013-07-16 DIAGNOSIS — W010XXA Fall on same level from slipping, tripping and stumbling without subsequent striking against object, initial encounter: Secondary | ICD-10-CM | POA: Insufficient documentation

## 2013-07-16 DIAGNOSIS — S199XXA Unspecified injury of neck, initial encounter: Secondary | ICD-10-CM | POA: Diagnosis not present

## 2013-07-16 DIAGNOSIS — K219 Gastro-esophageal reflux disease without esophagitis: Secondary | ICD-10-CM | POA: Insufficient documentation

## 2013-07-16 DIAGNOSIS — M79605 Pain in left leg: Secondary | ICD-10-CM

## 2013-07-16 DIAGNOSIS — S0990XA Unspecified injury of head, initial encounter: Secondary | ICD-10-CM | POA: Insufficient documentation

## 2013-07-16 DIAGNOSIS — S0993XA Unspecified injury of face, initial encounter: Secondary | ICD-10-CM | POA: Insufficient documentation

## 2013-07-16 DIAGNOSIS — Z79899 Other long term (current) drug therapy: Secondary | ICD-10-CM | POA: Diagnosis not present

## 2013-07-16 DIAGNOSIS — S99919A Unspecified injury of unspecified ankle, initial encounter: Secondary | ICD-10-CM | POA: Diagnosis not present

## 2013-07-16 DIAGNOSIS — W19XXXA Unspecified fall, initial encounter: Secondary | ICD-10-CM

## 2013-07-16 DIAGNOSIS — Z87448 Personal history of other diseases of urinary system: Secondary | ICD-10-CM | POA: Diagnosis not present

## 2013-07-16 DIAGNOSIS — Y9301 Activity, walking, marching and hiking: Secondary | ICD-10-CM | POA: Diagnosis not present

## 2013-07-16 DIAGNOSIS — IMO0002 Reserved for concepts with insufficient information to code with codable children: Secondary | ICD-10-CM | POA: Diagnosis not present

## 2013-07-16 DIAGNOSIS — E049 Nontoxic goiter, unspecified: Secondary | ICD-10-CM | POA: Diagnosis not present

## 2013-07-16 DIAGNOSIS — S99929A Unspecified injury of unspecified foot, initial encounter: Secondary | ICD-10-CM

## 2013-07-16 MED ORDER — OXYCODONE-ACETAMINOPHEN 5-325 MG PO TABS
1.0000 | ORAL_TABLET | Freq: Once | ORAL | Status: AC
  Administered 2013-07-16: 1 via ORAL
  Filled 2013-07-16: qty 1

## 2013-07-16 MED ORDER — ACETAMINOPHEN 325 MG PO TABS
650.0000 mg | ORAL_TABLET | Freq: Once | ORAL | Status: AC
  Administered 2013-07-16: 650 mg via ORAL
  Filled 2013-07-16: qty 2

## 2013-07-16 NOTE — Discharge Instructions (Signed)
Use RICE method - see below Return to the emergency department if you develop any changing/worsening condition, severe headache, weakness, confusion, loss of bowel/bladder function, or any other concerns (please read additional information regarding your condition below)    Head Injury You have received a head injury. It does not appear serious at this time. Headaches and vomiting are common following head injury. It should be easy to awaken from sleeping. Sometimes it is necessary for you to stay in the emergency department for a while for observation. Sometimes admission to the hospital may be needed. After injuries such as yours, most problems occur within the first 24 hours, but side effects may occur up to 7-10 days after the injury. It is important for you to carefully monitor your condition and contact your health care provider or seek immediate medical care if there is a change in your condition. WHAT ARE THE TYPES OF HEAD INJURIES? Head injuries can be as minor as a bump. Some head injuries can be more severe. More severe head injuries include:  A jarring injury to the brain (concussion).  A bruise of the brain (contusion). This mean there is bleeding in the brain that can cause swelling.  A cracked skull (skull fracture).  Bleeding in the brain that collects, clots, and forms a bump (hematoma). WHAT CAUSES A HEAD INJURY? A serious head injury is most likely to happen to someone who is in a car wreck and is not wearing a seat belt. Other causes of major head injuries include bicycle or motorcycle accidents, sports injuries, and falls. HOW ARE HEAD INJURIES DIAGNOSED? A complete history of the event leading to the injury and your current symptoms will be helpful in diagnosing head injuries. Many times, pictures of the brain, such as CT or MRI are needed to see the extent of the injury. Often, an overnight hospital stay is necessary for observation.  WHEN SHOULD I SEEK IMMEDIATE MEDICAL  CARE?  You should get help right away if:  You have confusion or drowsiness.  You feel sick to your stomach (nauseous) or have continued, forceful vomiting.  You have dizziness or unsteadiness that is getting worse.  You have severe, continued headaches not relieved by medicine. Only take over-the-counter or prescription medicines for pain, fever, or discomfort as directed by your health care provider.  You do not have normal function of the arms or legs or are unable to walk.  You notice changes in the black spots in the center of the colored part of your eye (pupil).  You have a clear or bloody fluid coming from your nose or ears.  You have a loss of vision. During the next 24 hours after the injury, you must stay with someone who can watch you for the warning signs. This person should contact local emergency services (911 in the [place] if you have seizures, you become unconscious, or you are unable to wake up. HOW CAN I PREVENT A HEAD INJURY IN THE FUTURE? The most important factor for preventing major head injuries is avoiding motor vehicle accidents. To minimize the potential for damage to your head, it is crucial to wear seat belts while riding in motor vehicles. Wearing helmets while bike riding and playing collision sports (like football) is also helpful. Also, avoiding dangerous activities around the house will further help reduce your risk of head injury.  WHEN CAN I RETURN TO NORMAL ACTIVITIES AND ATHLETICS? You should be reevaluated by your health care provider before returning to these activities.  If you have any of the following symptoms, you should not return to activities or contact sports until 1 week after the symptoms have stopped:  Persistent headache.  Dizziness or vertigo.  Poor attention and concentration.  Confusion.  Memory problems.  Nausea or vomiting.  Fatigue or tire easily.  Irritability.  Intolerant of bright lights or loud noises.  Anxiety or  depression.  Disturbed sleep. MAKE SURE YOU:   Understand these instructions.  Will watch your condition.  Will get help right away if you are not doing well or get worse. Document Released: 01/12/2005 Document Revised: 01/17/2013 Document Reviewed: 09/19/2012 [facility] Patient Information 2015 [place], [place]. This information is not intended to replace advice given to you by your health care provider. Make sure you discuss any questions you have with your health care provider.  Musculoskeletal Pain Musculoskeletal pain is muscle and boney aches and pains. These pains can occur in any part of the body. Your caregiver may treat you without knowing the cause of the pain. They may treat you if blood or urine tests, X-rays, and other tests were normal.  CAUSES There is often not a definite cause or reason for these pains. These pains may be caused by a type of germ (virus). The discomfort may also come from overuse. Overuse includes working out too hard when your body is not fit. Boney aches also come from weather changes. Bone is sensitive to atmospheric pressure changes. HOME CARE INSTRUCTIONS   Ask when your test results will be ready. Make sure you get your test results.  Only take over-the-counter or prescription medicines for pain, discomfort, or fever as directed by your caregiver. If you were given medications for your condition, do not drive, operate machinery or power tools, or sign legal documents for 24 hours. Do not drink alcohol. Do not take sleeping pills or other medications that may interfere with treatment.  Continue all activities unless the activities cause more pain. When the pain lessens, slowly resume normal activities. Gradually increase the intensity and duration of the activities or exercise.  During periods of severe pain, bed rest may be helpful. Lay or sit in any position that is comfortable.  Putting ice on the injured area.  Put ice in a bag.  Place a towel  between your skin and the bag.  Leave the ice on for 15 to 20 minutes, 3 to 4 times a day.  Follow up with your caregiver for continued problems and no reason can be found for the pain. If the pain becomes worse or does not go away, it may be necessary to repeat tests or do additional testing. Your caregiver may need to look further for a possible cause. SEEK IMMEDIATE MEDICAL CARE IF:  You have pain that is getting worse and is not relieved by medications.  You develop chest pain that is associated with shortness or breath, sweating, feeling sick to your stomach (nauseous), or throw up (vomit).  Your pain becomes localized to the abdomen.  You develop any new symptoms that seem different or that concern you. MAKE SURE YOU:   Understand these instructions.  Will watch your condition.  Will get help right away if you are not doing well or get worse. Document Released: 01/12/2005 Document Revised: 04/06/2011 Document Reviewed: 09/16/2012 [facility] Patient Information 2015 [place], [place]. This information is not intended to replace advice given to you by your health care provider. Make sure you discuss any questions you have with your health care provider.  RICE: Routine Care for Injuries The routine care of many injuries includes Rest, Ice, Compression, and Elevation (RICE). HOME CARE INSTRUCTIONS  Rest is needed to allow your body to heal. Routine activities can usually be resumed when comfortable. Injured tendons and bones can take up to 6 weeks to heal. Tendons are the cord-like structures that attach muscle to bone.  Ice following an injury helps keep the swelling down and reduces pain.  Put ice in a plastic bag.  Place a towel between your skin and the bag.  Leave the ice on for 15-20 minutes, 3-4 times a day, or as directed by your health care provider. Do this while awake, for the first 24 to 48 hours. After that, continue as directed by your caregiver.  Compression helps  keep swelling down. It also gives support and helps with discomfort. If an elastic bandage has been applied, it should be removed and reapplied every 3 to 4 hours. It should not be applied tightly, but firmly enough to keep swelling down. Watch fingers or toes for swelling, bluish discoloration, coldness, numbness, or excessive pain. If any of these problems occur, remove the bandage and reapply loosely. Contact your caregiver if these problems continue.  Elevation helps reduce swelling and decreases pain. With extremities, such as the arms, hands, legs, and feet, the injured area should be placed near or above the level of the heart, if possible. SEEK IMMEDIATE MEDICAL CARE IF:  You have persistent pain and swelling.  You develop redness, numbness, or unexpected weakness.  Your symptoms are getting worse rather than improving after several days. These symptoms may indicate that further evaluation or further X-rays are needed. Sometimes, X-rays may not show a small broken bone (fracture) until 1 week or 10 days later. Make a follow-up appointment with your caregiver. Ask when your X-ray results will be ready. Make sure you get your X-ray results. Document Released: 04/26/2000 Document Revised: 01/17/2013 Document Reviewed: 06/13/2010 [facility] Patient Information 2015 [place], [place]. This information is not intended to replace advice given to you by your health care provider. Make sure you discuss any questions you have with your health care provider.

## 2013-07-16 NOTE — ED Notes (Signed)
Pt presents with c/o fall that occurred earlier this evening. Pt says that she fell on concrete, fell backwards. Pt says that she hit the back of head and she is also hurting between her shoulders and the bottom of her back. Pt also c/o left foot and left leg pain. Pt is c/o dizziness at this time and feels nauseated.

## 2013-07-16 NOTE — ED Provider Notes (Signed)
CSN: [id number]     Arrival date & time 07/16/13  1842 History   First MD Initiated Contact with Patient 07/16/13 1907     Chief Complaint  Patient presents with  . Fall  . Head Injury   HPI  [name] is a 63 y.o. female with a PMH of IBS, thyroid goiter, GERD, dyspnea, heart murmur, asthma, headaches, kidney stones, and pyelonephritis who presents to the ED for evaluation of fall and head injury. History was provided by the patient. Patient states that PTA she was walking in the grocery store when she slipped on a wet floor and fell backwards hitting the back of her head. She does not believe she lost consciousness. She states she "saw stars" and felt dizzy. She currently has a generalized headache. No photophobia, vision changes, weakness, confusion, numbness/tingling, or loss of sensation. She also complains of neck pain and back pain (upper and lower). She denies any loss of bowel/bladder function. No abdominal pain, but she does exhibit nausea with no emesis. She also complains of left foot, ankle and knee pain but believes she may have gotten her leg caught on a rug, which caused her pain. Patient has hx of left knee replacement.      Past Medical History  Diagnosis Date  . IBS (irritable bowel syndrome)   . Thyroid goiter     I 131 rx and then  total throidectomy 2005 [facility]  . GERD (gastroesophageal reflux disease)   . Dyspnea     nl PFTs, and echo  . Recurrent oral ulcers   . Heart murmur   . Asthma   . Headache(784.0)   . History of kidney stones   . Nephrolithiasis   . Acute pyelonephritis 05/02/2012   Past Surgical History  Procedure Laterality Date  . Total thyroidectomy  2005    for  large nodule 2006  . Abdominal hysterectomy  1994    fibroid  . Appendectomy  1968  . Tonsillectomy  1979  . Orif left tivial plateau fracture      Dr. Collier Salina 2/08  . Plates and pins take out of left knee  06/2008   Family History  Problem Relation Age of Onset  . Cancer  Mother     bladder  . COPD Sister   . Coronary artery disease Sister     age 23 also copd  . Arthritis Other   . Cancer Other     breast  . Diabetes Other   . Hyperlipidemia Other   . Hypertension Other   . Stroke Other   . Heart disease Other   . Coronary artery disease Father     also had AAA  . Aneurysm Father    History  Substance Use Topics  . Smoking status: Never Smoker   . Smokeless tobacco: Never Used  . Alcohol Use: Yes     Comment: seldom   OB History   Grav Para Term Preterm Abortions TAB SAB Ect Mult Living   4 4             Review of Systems  HENT: Negative for dental problem.   Eyes: Negative for photophobia and visual disturbance.  Respiratory: Negative for cough and shortness of breath.   Cardiovascular: Negative for chest pain and leg swelling.  Gastrointestinal: Positive for nausea. Negative for vomiting and abdominal pain.  Musculoskeletal: Positive for arthralgias, back pain, myalgias and neck pain. Negative for gait problem and joint swelling.  Skin: Negative for wound.  Neurological: Positive for dizziness and headaches. Negative for syncope, facial asymmetry, speech difficulty, weakness, light-headedness and numbness.    Allergies  Ritalin  Home Medications   Prior to Admission medications   Medication Sig Start Date End Date Taking? Authorizing Provider  acetaminophen (TYLENOL) 500 MG tablet Take 1,000 mg by mouth every 6 (six) hours as needed.    Historical Provider, MD  pantoprazole (PROTONIX) 40 MG tablet Take 1 tablet (40 mg total) by mouth 2 (two) times daily. 03/13/13   Burnis Medin, MD  pravastatin (PRAVACHOL) 40 MG tablet Take 1 tablet (40 mg total) by mouth daily. 04/29/12   Burnis Medin, MD  predniSONE (DELTASONE) 10 MG tablet Take  4 each am x 2 days,   2 each am x 2 days,  1 each am x 2 days and stop 06/15/13   Tanda Rockers, MD  rOPINIRole (REQUIP) 0.5 MG tablet Take 1 tablet (0.5 mg total) by mouth 4 (four) times daily. Or as  directed 04/29/12   Burnis Medin, MD  SYNTHROID 88 MCG tablet TAKE 1 TABLET BY MOUTH ONCE DAILY 01/24/13   Burnis Medin, MD  traMADol Veatrice Bourbon) 50 MG tablet 1-2 every 4 hours as needed for cough or pain 06/15/13   Tanda Rockers, MD  valsartan (DIOVAN) 80 MG tablet Take 1 tablet (80 mg total) by mouth daily. 06/26/13   Burnis Medin, MD   BP 169/92  Pulse 61  Temp(Src) 98.2 F (36.8 C) (Oral)  Resp 20  SpO2 96%  Filed Vitals:   07/16/13 1846 07/16/13 2056  BP: 169/92 124/79  Pulse: 61 63  Temp: 98.2 F (36.8 C) 98 F (36.7 C)  TempSrc: Oral Oral  Resp: 20 16  SpO2: 96% 94%    Physical Exam  Nursing note and vitals reviewed. Constitutional: She is oriented to person, place, and time. She appears well-developed and well-nourished. No distress.  HENT:  Head: Normocephalic and atraumatic.  Right Ear: External ear normal.  Left Ear: External ear normal.  Nose: Nose normal.  Mouth/Throat: Oropharynx is clear and moist. No oropharyngeal exudate.  No tenderness to the scalp or face throughout. No palpable hematoma, step-offs, or lacerations throughout.  Tympanic membranes gray and translucent bilaterally.    Eyes: Conjunctivae and EOM are normal. Pupils are equal, round, and reactive to light. Right eye exhibits no discharge. Left eye exhibits no discharge.  Neck: Normal range of motion. Neck supple.  Cervical spinal tenderness. No paraspinal tenderness.   Cardiovascular: Normal rate, regular rhythm, normal heart sounds and intact distal pulses.  Exam reveals no gallop and no friction rub.   No murmur heard. Pulmonary/Chest: Effort normal and breath sounds normal. No respiratory distress. She has no wheezes. She has no rales. She exhibits no tenderness.  Abdominal: Soft. She exhibits no distension and no mass. There is no tenderness. There is no rebound and no guarding.  Musculoskeletal: Normal range of motion. She exhibits tenderness. She exhibits no edema.       Arms:      Legs:       Feet:  Tenderness to palpation to the left anterior knee, left anterior ankle, and left medial foot. Tenderness to palpation to the thoracic and lumbar spine. Strength 5/5 in the upper and lower extremities bilaterally. Patient able to ambulate without difficulty or ataxia.   Neurological: She is alert and oriented to person, place, and time.  GCS 15. No focal neurological deficits. CN 2-12 intact.  Skin: Skin is warm and dry. She is not diaphoretic.  No edema, ecchymosis, erythema or wounds throughout     ED Course  Procedures (including critical care time) Labs Review Labs Reviewed - No data to display  Imaging Review Dg Thoracic Spine 2 View  07/16/2013   CLINICAL DATA:  Fall with mid back pain.  EXAM: THORACIC SPINE - 2 VIEW  COMPARISON:  06/15/2013 and prior chest radiographs  FINDINGS: There is no evidence of thoracic spine fracture. Alignment is normal. No other significant bone abnormalities are identified.  IMPRESSION: Negative.   Electronically Signed   By: Hassan Rowan M.D.   On: 07/16/2013 19:59   Dg Lumbar Spine Complete  07/16/2013   CLINICAL DATA:  Status post fall  EXAM: LUMBAR SPINE - COMPLETE 4+ VIEW  COMPARISON:  None.  FINDINGS: There are 5 nonrib bearing lumbar-type vertebral bodies. The vertebral body heights are maintained. The alignment is anatomic. There is no spondylolysis. There is no acute fracture or static listhesis. Mild degenerative endplate spurring at S9-2 and L4-5. The disc heights are maintained.  The SI joints are unremarkable.  IMPRESSION: No acute osseous injury of the lumbar spine.   Electronically Signed   By: Kathreen Devoid   On: 07/16/2013 19:58   Dg Ankle Complete Left  07/16/2013   CLINICAL DATA:  63 year old female with fall and left ankle pain.  EXAM: LEFT ANKLE COMPLETE - 3+ VIEW  COMPARISON:  None.  FINDINGS: There is no evidence of fracture, dislocation, or joint effusion. There is no evidence of arthropathy or other focal bone abnormality.  Soft tissues are unremarkable.  IMPRESSION: Negative.   Electronically Signed   By: Hassan Rowan M.D.   On: 07/16/2013 20:02   Ct Head Wo Contrast  07/16/2013   CLINICAL DATA:  63 year old female with head and neck injury, headache and neck pain. Dizziness and nausea.  EXAM: CT HEAD WITHOUT CONTRAST  CT CERVICAL SPINE WITHOUT CONTRAST  TECHNIQUE: Multidetector CT imaging of the head and cervical spine was performed following the standard protocol without intravenous contrast. Multiplanar CT image reconstructions of the cervical spine were also generated.  COMPARISON:  None.  FINDINGS: CT HEAD FINDINGS  No intracranial abnormalities are identified, including mass lesion or mass effect, hydrocephalus, extra-axial fluid collection, midline shift, hemorrhage, or acute infarction.  The visualized bony calvarium is unremarkable.  CT CERVICAL SPINE FINDINGS  Normal alignment is noted.  There is no evidence of fracture, subluxation or prevertebral soft tissue swelling.  The disc spaces are maintained.  No focal bony lesions are present.  The soft tissue structures are unremarkable.  IMPRESSION: Unremarkable noncontrast head CT and cervical spine CT.   Electronically Signed   By: Hassan Rowan M.D.   On: 07/16/2013 20:20   Ct Cervical Spine Wo Contrast  07/16/2013   CLINICAL DATA:  63 year old female with head and neck injury, headache and neck pain. Dizziness and nausea.  EXAM: CT HEAD WITHOUT CONTRAST  CT CERVICAL SPINE WITHOUT CONTRAST  TECHNIQUE: Multidetector CT imaging of the head and cervical spine was performed following the standard protocol without intravenous contrast. Multiplanar CT image reconstructions of the cervical spine were also generated.  COMPARISON:  None.  FINDINGS: CT HEAD FINDINGS  No intracranial abnormalities are identified, including mass lesion or mass effect, hydrocephalus, extra-axial fluid collection, midline shift, hemorrhage, or acute infarction.  The visualized bony calvarium is unremarkable.   CT CERVICAL SPINE FINDINGS  Normal alignment is noted.  There is no evidence of  fracture, subluxation or prevertebral soft tissue swelling.  The disc spaces are maintained.  No focal bony lesions are present.  The soft tissue structures are unremarkable.  IMPRESSION: Unremarkable noncontrast head CT and cervical spine CT.   Electronically Signed   By: Hassan Rowan M.D.   On: 07/16/2013 20:20   Dg Knee Complete 4 Views Left  07/16/2013   CLINICAL DATA:  Pain  EXAM: LEFT KNEE - COMPLETE 4+ VIEW  COMPARISON:  None.  FINDINGS: There is no evidence of fracture, dislocation, or joint effusion. There is a total knee arthroplasty without hardware failure or complication. There is normal alignment. There is no evidence of arthropathy or other focal bone abnormality. Soft tissues are unremarkable.  IMPRESSION: Left total knee arthroplasty without hardware failure, complication, fracture or dislocation.   Electronically Signed   By: Kathreen Devoid   On: 07/16/2013 20:00   Dg Foot Complete Left  07/16/2013   CLINICAL DATA:  Status post fall  EXAM: LEFT FOOT - COMPLETE 3+ VIEW  COMPARISON:  None.  FINDINGS: There is no evidence of fracture or dislocation. There is no evidence of arthropathy or other focal bone abnormality. Soft tissues are unremarkable.  IMPRESSION: Negative.   Electronically Signed   By: Kathreen Devoid   On: 07/16/2013 19:59     EKG Interpretation None      MDM   [name] is a 63 y.o. female with a PMH of IBS, thyroid goiter, GERD, dyspnea, heart murmur, asthma, headaches, kidney stones, and pyelonephritis who presents to the ED for evaluation of fall and head injury. Patient describes a mechanical fall with head injury. Head CT negative. No neurological deficits. Patient also complained of neck pain. Cervical spine CT negative. She also complained of back pain. X-rays negative for fracture or malalignment. No warning signs or symptoms of back pain. No concern for cauda equina or other  serious/life threatening cause of back pain. Patient also had complaints of left foot, ankle and knee pain. X-rays negative for fracture or malalignment. Patient had improvements in her pain throughout her ED visit. RICE method discussed. Return precautions, discharge instructions, and follow-up was discussed with the patient before discharge.    Rechecks  8:30 PM = Patient ambulating without difficulty or ataxia. Pain improved.     Discharge Medication List as of 07/16/2013  8:46 PM      Final impressions: 1. Fall, initial encounter   2. Head injury, initial encounter   3. Neck pain   4. Midline back pain, unspecified location   5. Left leg pain       Harold Hedge Kirby Cortese PA-C         Lucila Maine, Helen 07/17/13 [phone]

## 2013-07-17 ENCOUNTER — Telehealth: Payer: Self-pay | Admitting: Internal Medicine

## 2013-07-17 NOTE — Telephone Encounter (Signed)
3 15 and 330 block tomorrow.  tues June 23

## 2013-07-17 NOTE — Telephone Encounter (Signed)
Pt calling to report she fell in parking lot yesterday and hit her head.  She was taken to [facility] for evaluation and treatment and states she was told to follow up with PCP asap.  Please advise where pt can be worked in for post ED followup for head injury.

## 2013-07-18 ENCOUNTER — Ambulatory Visit (INDEPENDENT_AMBULATORY_CARE_PROVIDER_SITE_OTHER): Payer: BC Managed Care – PPO | Admitting: Internal Medicine

## 2013-07-18 ENCOUNTER — Encounter: Payer: Self-pay | Admitting: Internal Medicine

## 2013-07-18 VITALS — BP 150/86 | Temp 98.6°F | Ht 60.0 in | Wt 169.0 lb

## 2013-07-18 DIAGNOSIS — IMO0002 Reserved for concepts with insufficient information to code with codable children: Secondary | ICD-10-CM

## 2013-07-18 DIAGNOSIS — S0990XA Unspecified injury of head, initial encounter: Secondary | ICD-10-CM | POA: Insufficient documentation

## 2013-07-18 DIAGNOSIS — W19XXXA Unspecified fall, initial encounter: Secondary | ICD-10-CM | POA: Insufficient documentation

## 2013-07-18 DIAGNOSIS — S39012A Strain of muscle, fascia and tendon of lower back, initial encounter: Secondary | ICD-10-CM | POA: Insufficient documentation

## 2013-07-18 DIAGNOSIS — W19XXXS Unspecified fall, sequela: Secondary | ICD-10-CM

## 2013-07-18 DIAGNOSIS — S060X9A Concussion with loss of consciousness of unspecified duration, initial encounter: Secondary | ICD-10-CM | POA: Insufficient documentation

## 2013-07-18 DIAGNOSIS — T7589XS Other specified effects of external causes, sequela: Secondary | ICD-10-CM

## 2013-07-18 DIAGNOSIS — S060XAA Concussion with loss of consciousness status unknown, initial encounter: Secondary | ICD-10-CM | POA: Insufficient documentation

## 2013-07-18 DIAGNOSIS — S060X0A Concussion without loss of consciousness, initial encounter: Secondary | ICD-10-CM

## 2013-07-18 NOTE — ED Provider Notes (Signed)
Medical screening examination/treatment/procedure(s) were performed by non-physician practitioner and as supervising physician I was immediately available for consultation/collaboration.   EKG Interpretation None        Osvaldo Shipper, MD 07/18/13 [phone]

## 2013-07-18 NOTE — Progress Notes (Signed)
Pre visit review using our clinic review tool, if applicable. No additional management support is needed unless otherwise documented below in the visit note.   Chief Complaint  Patient presents with  . Hospitalization Follow-up    Pt complains of back pain, neck pain, head pain and left foot pain.  Fell at the grocery story on Sunday.    HPI: Patient come in for follow up from ED visit 6 21 Slipped  Wet floor at grocery store no loc  Was with grand child  Called husband to take to [facility]ips[facility].  And x rays negative . Stars. Swimmy headed and dizzy.  Post head and back to buttocks are painful has headache back of head  Now left  Foot sore   X rays of head neck back foot etc no fx using tylenol.  A bit unsteady  bp has been ok except hosp  Was normal on DC ROS: See pertinent positives and negatives per HPI. No vision hearing changes   Past Medical History  Diagnosis Date  . IBS (irritable bowel syndrome)   . Thyroid goiter     I 131 rx and then  total throidectomy 2005 [facility]  . GERD (gastroesophageal reflux disease)   . Dyspnea     nl PFTs, and echo  . Recurrent oral ulcers   . Heart murmur   . Asthma   . Headache(784.0)   . History of kidney stones   . Nephrolithiasis   . Acute pyelonephritis 05/02/2012    Family History  Problem Relation Age of Onset  . Cancer Mother     bladder  . COPD Sister   . Coronary artery disease Sister     age 16 also copd  . Arthritis Other   . Cancer Other     breast  . Diabetes Other   . Hyperlipidemia Other   . Hypertension Other   . Stroke Other   . Heart disease Other   . Coronary artery disease Father     also had AAA  . Aneurysm Father     History   Social History  . Marital Status: Married    Spouse Name: N/A    Number of Children: N/A  . Years of Education: N/A   Social History Main Topics  . Smoking status: Never Smoker   . Smokeless tobacco: Never Used  . Alcohol Use: Yes     Comment: seldom  . Drug Use: No  .  Sexual Activity: None   Other Topics Concern  . None   Social History Narrative   Married   Has grandchildren   Never smoked    G4P4   hh of 2  care of GC ages 73 - 24 month  5 days a week    Outpatient Encounter Prescriptions as of 07/18/2013  Medication Sig  . acetaminophen (TYLENOL) 500 MG tablet Take 1,000 mg by mouth every 6 (six) hours as needed for moderate pain.   Marland Kitchen levothyroxine (SYNTHROID, LEVOTHROID) 88 MCG tablet Take 88 mcg by mouth daily before breakfast.  . pantoprazole (PROTONIX) 40 MG tablet Take 40 mg by mouth 2 (two) times daily.  . pravastatin (PRAVACHOL) 40 MG tablet Take 40 mg by mouth every evening.  Marland Kitchen rOPINIRole (REQUIP) 0.5 MG tablet Take 0.5-1 mg by mouth 3 (three) times daily. Take 1 tab at 1200, 1 tab at 1700, and 2 tabs at bedtime.  . valsartan (DIOVAN) 80 MG tablet Take 80 mg by mouth every morning.    EXAM:  BP 150/86  Temp(Src) 98.6 F (37 C) (Oral)  Ht 5' (1.524 m)  Wt 169 lb (76.658 kg)  BMI 33.01 kg/m2  Body mass index is 33.01 kg/(m^2).  GENERAL: vitals reviewed and listed above, alert, oriented, appears well hydrated and in no acute distress HEENT: mild tenderness occiput no bruise conjunctiva  clear, no obvious abnormalities on inspection of external nose and ears  Tm grey partly occluded with wax OP : no lesion edema or exudate tongue midline NECK: no obvious masses on inspection palpation supple tight muscles  LUNGS: clear to auscultation bilaterally, no wheezes, rales or rhonchi, good air movement CV: HRRR, no clubbing cyanosis or  peripheral edema nl cap refill  MS: moves all extremities without noticeable focal  Abnormality left great toe tender  gaitl mileu antalgic  Back no bruising  Neuro CN3-12 ok dtrs present  rhomberg a bit unsteady swayingno drift. Gait slightly wide based no weakness  PSYCH: pleasant and cooperative, Oriented x 3. Normal cognition, attention, speech. Not anxious or depressed appearing   Good eye contact  .   ASSESSMENT AND PLAN:  Discussed the following assessment and plan:  Mild concussion, without loss of consciousness, initial encounter - based on hx of dizzyness imbalance fatigue HA  no focal close observation exp managment and fu   Head injury, unspecified - slipped on wet in grocery store occipital landing  Back strain, initial encounter  Fall, sequela  -Patient advised to return or notify health care team  if symptoms worsen ,persist or new concerns arise.  Patient Instructions  Exam and hx CW mild concussion. At this time ice and  Tylenol  Rest and  Observation  The dizziness and wobbly ness should get better with time  Contact us if increasing sx headache.  ROV in 7-14 days or as needed .   Concussion A concussion, or closed-head injury, is a brain injury caused by a direct blow to the head or by a quick and sudden movement (jolt) of the head or neck. Concussions are usually not life-threatening. Even so, the effects of a concussion can be serious. If you have had a concussion before, you are more likely to experience concussion-like symptoms after a direct blow to the head.  CAUSES  Direct blow to the head, such as from running into another player during a soccer game, being hit in a fight, or hitting your head on a hard surface.  A jolt of the head or neck that causes the brain to move back and forth inside the skull, such as in a car crash. SIGNS AND SYMPTOMS The signs of a concussion can be hard to notice. Early on, they may be missed by you, family members, and health care providers. You may look fine but act or feel differently. Symptoms are usually temporary, but they may last for days, weeks, or even longer. Some symptoms may appear right away while others may not show up for hours or days. Every head injury is different. Symptoms include:  Mild to moderate headaches that will not go away.  A feeling of pressure inside your head.  Having more trouble than  usual:  Learning or remembering things you have heard.  Answering questions.  Paying attention or concentrating.  Organizing daily tasks.  Making decisions and solving problems.  Slowness in thinking, acting or reacting, speaking, or reading.  Getting lost or being easily confused.  Feeling tired all the time or lacking energy (fatigued).  Feeling drowsy.  Sleep disturbances.  Sleeping  more than usual.  Sleeping less than usual.  Trouble falling asleep.  Trouble sleeping (insomnia).  Loss of balance or feeling lightheaded or dizzy.  Nausea or vomiting.  Numbness or tingling.  Increased sensitivity to:  Sounds.  Lights.  Distractions.  Vision problems or eyes that tire easily.  Diminished sense of taste or smell.  Ringing in the ears.  Mood changes such as feeling sad or anxious.  Becoming easily irritated or angry for little or no reason.  Lack of motivation.  Seeing or hearing things other people do not see or hear (hallucinations). DIAGNOSIS Your health care provider can usually diagnose a concussion based on a description of your injury and symptoms. He or she will ask whether you passed out (lost consciousness) and whether you are having trouble remembering events that happened right before and during your injury. Your evaluation might include:  A brain scan to look for signs of injury to the brain. Even if the test shows no injury, you may still have a concussion.  Blood tests to be sure other problems are not present. TREATMENT  Concussions are usually treated in an emergency department, in urgent care, or at a clinic. You may need to stay in the hospital overnight for further treatment.  Tell your health care provider if you are taking any medicines, including prescription medicines, over-the-counter medicines, and natural remedies. Some medicines, such as blood thinners (anticoagulants) and aspirin, may increase the chance of complications.  Also tell your health care provider whether you have had alcohol or are taking illegal drugs. This information may affect treatment.  Your health care provider will send you home with important instructions to follow.  How fast you will recover from a concussion depends on many factors. These factors include how severe your concussion is, what part of your brain was injured, your age, and how healthy you were before the concussion.  Most people with mild injuries recover fully. Recovery can take time. In general, recovery is slower in older persons. Also, persons who have had a concussion in the past or have other medical problems may find that it takes longer to recover from their current injury. HOME CARE INSTRUCTIONS General Instructions  Carefully follow the directions your health care provider gave you.  Only take over-the-counter or prescription medicines for pain, discomfort, or fever as directed by your health care provider.  Take only those medicines that your health care provider has approved.  Do not drink alcohol until your health care provider says you are well enough to do so. Alcohol and certain other drugs may slow your recovery and can put you at risk of further injury.  If it is harder than usual to remember things, write them down.  If you are easily distracted, try to do one thing at a time. For example, do not try to watch TV while fixing dinner.  Talk with family members or close friends when making important decisions.  Keep all follow-up appointments. Repeated evaluation of your symptoms is recommended for your recovery.  Watch your symptoms and tell others to do the same. Complications sometimes occur after a concussion. Older adults with a brain injury may have a higher risk of serious complications, such as a blood clot on the brain.  Tell your teachers, school nurse, school counselor, coach, athletic trainer, or work Freight forwarder about your injury, symptoms, and  restrictions. Tell them about what you can or cannot do. They should watch for:  Increased problems with attention or concentration.  Increased difficulty remembering or learning new information.  Increased time needed to complete tasks or assignments.  Increased irritability or decreased ability to cope with stress.  Increased symptoms.  Rest. Rest helps the brain to heal. Make sure you:  Get plenty of sleep at night. Avoid staying up late at night.  Keep the same bedtime hours on weekends and weekdays.  Rest during the day. Take daytime naps or rest breaks when you feel tired.  Limit activities that require a lot of thought or concentration. These include:  Doing homework or job-related work.  Watching TV.  Working on the computer.  Avoid any situation where there is potential for another head injury (football, hockey, soccer, basketball, martial arts, downhill snow sports and horseback riding). Your condition will get worse every time you experience a concussion. You should avoid these activities until you are evaluated by the appropriate follow-up health care providers. Returning To Your Regular Activities You will need to return to your normal activities slowly, not all at once. You must give your body and brain enough time for recovery.  Do not return to sports or other athletic activities until your health care provider tells you it is safe to do so.  Ask your health care provider when you can drive, ride a bicycle, or operate heavy machinery. Your ability to react may be slower after a brain injury. Never do these activities if you are dizzy.  Ask your health care provider about when you can return to work or school. Preventing Another Concussion It is very important to avoid another brain injury, especially before you have recovered. In rare cases, another injury can lead to permanent brain damage, brain swelling, or death. The risk of this is greatest during the first  7-10 days after a head injury. Avoid injuries by:  Wearing a seat belt when riding in a car.  Drinking alcohol only in moderation.  Wearing a helmet when biking, skiing, skateboarding, skating, or doing similar activities.  Avoiding activities that could lead to a second concussion, such as contact or recreational sports, until your health care provider says it is okay.  Taking safety measures in your home.  Remove clutter and tripping hazards from floors and stairways.  Use grab bars in bathrooms and handrails by stairs.  Place non-slip mats on floors and in bathtubs.  Improve lighting in dim areas. SEEK MEDICAL CARE IF:  You have increased problems paying attention or concentrating.  You have increased difficulty remembering or learning new information.  You need more time to complete tasks or assignments than before.  You have increased irritability or decreased ability to cope with stress.  You have more symptoms than before. Seek medical care if you have any of the following symptoms for more than 2 weeks after your injury:  Lasting (chronic) headaches.  Dizziness or balance problems.  Nausea.  Vision problems.  Increased sensitivity to noise or light.  Depression or mood swings.  Anxiety or irritability.  Memory problems.  Difficulty concentrating or paying attention.  Sleep problems.  Feeling tired all the time. SEEK IMMEDIATE MEDICAL CARE IF:  You have severe or worsening headaches. These may be a sign of a blood clot in the brain.  You have weakness (even if only in one hand, leg, or part of the face).  You have numbness.  You have decreased coordination.  You vomit repeatedly.  You have increased sleepiness.  One pupil is larger than the other.  You have convulsions.  You  have slurred speech.  You have increased confusion. This may be a sign of a blood clot in the brain.  You have increased restlessness, agitation, or  irritability.  You are unable to recognize people or places.  You have neck pain.  It is difficult to wake you up.  You have unusual behavior changes.  You lose consciousness. MAKE SURE YOU:  Understand these instructions.  Will watch your condition.  Will get help right away if you are not doing well or get worse. Document Released: 04/04/2003 Document Revised: 01/17/2013 Document Reviewed: 08/04/2012 [facility] Patient Information 2015 [place], [place]. This information is not intended to replace advice given to you by your health care provider. Make sure you discuss any questions you have with your health care provider.      Standley Brooking. Panosh M.D.

## 2013-07-18 NOTE — Patient Instructions (Signed)
Exam and hx CW mild concussion. At this time ice and  Tylenol  Rest and  Observation  The dizziness and wobbly ness should get better with time  Contact us if increasing sx headache.  ROV in 7-14 days or as needed .   Concussion A concussion, or closed-head injury, is a brain injury caused by a direct blow to the head or by a quick and sudden movement (jolt) of the head or neck. Concussions are usually not life-threatening. Even so, the effects of a concussion can be serious. If you have had a concussion before, you are more likely to experience concussion-like symptoms after a direct blow to the head.  CAUSES  Direct blow to the head, such as from running into another player during a soccer game, being hit in a fight, or hitting your head on a hard surface.  A jolt of the head or neck that causes the brain to move back and forth inside the skull, such as in a car crash. SIGNS AND SYMPTOMS The signs of a concussion can be hard to notice. Early on, they may be missed by you, family members, and health care providers. You may look fine but act or feel differently. Symptoms are usually temporary, but they may last for days, weeks, or even longer. Some symptoms may appear right away while others may not show up for hours or days. Every head injury is different. Symptoms include:  Mild to moderate headaches that will not go away.  A feeling of pressure inside your head.  Having more trouble than usual:  Learning or remembering things you have heard.  Answering questions.  Paying attention or concentrating.  Organizing daily tasks.  Making decisions and solving problems.  Slowness in thinking, acting or reacting, speaking, or reading.  Getting lost or being easily confused.  Feeling tired all the time or lacking energy (fatigued).  Feeling drowsy.  Sleep disturbances.  Sleeping more than usual.  Sleeping less than usual.  Trouble falling asleep.  Trouble sleeping  (insomnia).  Loss of balance or feeling lightheaded or dizzy.  Nausea or vomiting.  Numbness or tingling.  Increased sensitivity to:  Sounds.  Lights.  Distractions.  Vision problems or eyes that tire easily.  Diminished sense of taste or smell.  Ringing in the ears.  Mood changes such as feeling sad or anxious.  Becoming easily irritated or angry for little or no reason.  Lack of motivation.  Seeing or hearing things other people do not see or hear (hallucinations). DIAGNOSIS Your health care Samanth Mirkin can usually diagnose a concussion based on a description of your injury and symptoms. He or she will ask whether you passed out (lost consciousness) and whether you are having trouble remembering events that happened right before and during your injury. Your evaluation might include:  A brain scan to look for signs of injury to the brain. Even if the test shows no injury, you may still have a concussion.  Blood tests to be sure other problems are not present. TREATMENT  Concussions are usually treated in an emergency department, in urgent care, or at a clinic. You may need to stay in the hospital overnight for further treatment.  Tell your health care Eliz Nigg if you are taking any medicines, including prescription medicines, over-the-counter medicines, and natural remedies. Some medicines, such as blood thinners (anticoagulants) and aspirin, may increase the chance of complications. Also tell your health care Jamerion Cabello whether you have had alcohol or are taking illegal drugs. This  information may affect treatment.  Your health care Antario Yasuda will send you home with important instructions to follow.  How fast you will recover from a concussion depends on many factors. These factors include how severe your concussion is, what part of your brain was injured, your age, and how healthy you were before the concussion.  Most people with mild injuries recover fully. Recovery can  take time. In general, recovery is slower in older persons. Also, persons who have had a concussion in the past or have other medical problems may find that it takes longer to recover from their current injury. HOME CARE INSTRUCTIONS General Instructions  Carefully follow the directions your health care Sussie Minor gave you.  Only take over-the-counter or prescription medicines for pain, discomfort, or fever as directed by your health care Rolfe Hartsell.  Take only those medicines that your health care Tobie Perdue has approved.  Do not drink alcohol until your health care Dal Blew says you are well enough to do so. Alcohol and certain other drugs may slow your recovery and can put you at risk of further injury.  If it is harder than usual to remember things, write them down.  If you are easily distracted, try to do one thing at a time. For example, do not try to watch TV while fixing dinner.  Talk with family members or close friends when making important decisions.  Keep all follow-up appointments. Repeated evaluation of your symptoms is recommended for your recovery.  Watch your symptoms and tell others to do the same. Complications sometimes occur after a concussion. Older adults with a brain injury may have a higher risk of serious complications, such as a blood clot on the brain.  Tell your teachers, school nurse, school counselor, coach, athletic trainer, or work Freight forwarder about your injury, symptoms, and restrictions. Tell them about what you can or cannot do. They should watch for:  Increased problems with attention or concentration.  Increased difficulty remembering or learning new information.  Increased time needed to complete tasks or assignments.  Increased irritability or decreased ability to cope with stress.  Increased symptoms.  Rest. Rest helps the brain to heal. Make sure you:  Get plenty of sleep at night. Avoid staying up late at night.  Keep the same bedtime hours on  weekends and weekdays.  Rest during the day. Take daytime naps or rest breaks when you feel tired.  Limit activities that require a lot of thought or concentration. These include:  Doing homework or job-related work.  Watching TV.  Working on the computer.  Avoid any situation where there is potential for another head injury (football, hockey, soccer, basketball, martial arts, downhill snow sports and horseback riding). Your condition will get worse every time you experience a concussion. You should avoid these activities until you are evaluated by the appropriate follow-up health care providers. Returning To Your Regular Activities You will need to return to your normal activities slowly, not all at once. You must give your body and brain enough time for recovery.  Do not return to sports or other athletic activities until your health care Saya Mccoll tells you it is safe to do so.  Ask your health care Jamoni Hewes when you can drive, ride a bicycle, or operate heavy machinery. Your ability to react may be slower after a brain injury. Never do these activities if you are dizzy.  Ask your health care [place] about when you can return to work or school. Preventing Another Concussion It is very important  to avoid another brain injury, especially before you have recovered. In rare cases, another injury can lead to permanent brain damage, brain swelling, or death. The risk of this is greatest during the first 7-10 days after a head injury. Avoid injuries by:  Wearing a seat belt when riding in a car.  Drinking alcohol only in moderation.  Wearing a helmet when biking, skiing, skateboarding, skating, or doing similar activities.  Avoiding activities that could lead to a second concussion, such as contact or recreational sports, until your health care Daenerys Buttram says it is okay.  Taking safety measures in your home.  Remove clutter and tripping hazards from floors and stairways.  Use grab bars  in bathrooms and handrails by stairs.  Place non-slip mats on floors and in bathtubs.  Improve lighting in dim areas. SEEK MEDICAL CARE IF:  You have increased problems paying attention or concentrating.  You have increased difficulty remembering or learning new information.  You need more time to complete tasks or assignments than before.  You have increased irritability or decreased ability to cope with stress.  You have more symptoms than before. Seek medical care if you have any of the following symptoms for more than 2 weeks after your injury:  Lasting (chronic) headaches.  Dizziness or balance problems.  Nausea.  Vision problems.  Increased sensitivity to noise or light.  Depression or mood swings.  Anxiety or irritability.  Memory problems.  Difficulty concentrating or paying attention.  Sleep problems.  Feeling tired all the time. SEEK IMMEDIATE MEDICAL CARE IF:  You have severe or worsening headaches. These may be a sign of a blood clot in the brain.  You have weakness (even if only in one hand, leg, or part of the face).  You have numbness.  You have decreased coordination.  You vomit repeatedly.  You have increased sleepiness.  One pupil is larger than the other.  You have convulsions.  You have slurred speech.  You have increased confusion. This may be a sign of a blood clot in the brain.  You have increased restlessness, agitation, or irritability.  You are unable to recognize people or places.  You have neck pain.  It is difficult to wake you up.  You have unusual behavior changes.  You lose consciousness. MAKE SURE YOU:  Understand these instructions.  Will watch your condition.  Will get help right away if you are not doing well or get worse. Document Released: 04/04/2003 Document Revised: 01/17/2013 Document Reviewed: 08/04/2012 [facility] Patient Information 2015 [place], [place]. This information is not intended to  replace advice given to you by your health care Gatlin Kittell. Make sure you discuss any questions you have with your health care Kanai Berrios.

## 2013-07-18 NOTE — Telephone Encounter (Signed)
Patient is scheduled for today @ 3pm.

## 2013-07-28 ENCOUNTER — Other Ambulatory Visit: Payer: Self-pay | Admitting: Internal Medicine

## 2013-08-01 ENCOUNTER — Ambulatory Visit (INDEPENDENT_AMBULATORY_CARE_PROVIDER_SITE_OTHER): Payer: BC Managed Care – PPO | Admitting: Internal Medicine

## 2013-08-01 ENCOUNTER — Encounter: Payer: Self-pay | Admitting: Internal Medicine

## 2013-08-01 VITALS — BP 148/84 | HR 69 | Temp 98.5°F | Ht 60.0 in | Wt 169.0 lb

## 2013-08-01 DIAGNOSIS — I10 Essential (primary) hypertension: Secondary | ICD-10-CM | POA: Insufficient documentation

## 2013-08-01 DIAGNOSIS — S069XAS Unspecified intracranial injury with loss of consciousness status unknown, sequela: Secondary | ICD-10-CM

## 2013-08-01 DIAGNOSIS — S0990XA Unspecified injury of head, initial encounter: Secondary | ICD-10-CM

## 2013-08-01 DIAGNOSIS — S060X0S Concussion without loss of consciousness, sequela: Secondary | ICD-10-CM

## 2013-08-01 DIAGNOSIS — S069X9S Unspecified intracranial injury with loss of consciousness of unspecified duration, sequela: Secondary | ICD-10-CM

## 2013-08-01 DIAGNOSIS — G2581 Restless legs syndrome: Secondary | ICD-10-CM

## 2013-08-01 DIAGNOSIS — E785 Hyperlipidemia, unspecified: Secondary | ICD-10-CM

## 2013-08-01 DIAGNOSIS — T7589XS Other specified effects of external causes, sequela: Secondary | ICD-10-CM

## 2013-08-01 DIAGNOSIS — W19XXXS Unspecified fall, sequela: Secondary | ICD-10-CM

## 2013-08-01 MED ORDER — VALSARTAN 80 MG PO TABS: 80.0000 mg | ORAL_TABLET | Freq: Every morning | ORAL | Status: DC

## 2013-08-01 MED ORDER — PRAVASTATIN SODIUM 40 MG PO TABS: 40.0000 mg | ORAL_TABLET | Freq: Every evening | ORAL | Status: DC

## 2013-08-01 MED ORDER — ROPINIROLE HCL 0.5 MG PO TABS: 0.5000 mg | ORAL_TABLET | Freq: Three times a day (TID) | ORAL | Status: DC

## 2013-08-01 NOTE — Progress Notes (Signed)
Pre visit review using our clinic review tool, if applicable. No additional management support is needed unless otherwise documented below in the visit note.  Chief Complaint  Patient presents with  . Follow-up    HPI: Here for fu [facility] concussion sx  See last visit  Head still  Hurts; some occipital   Still  A bit sleepy   Comes and goes  Not al the time. Now low back still bothers her but thinks it will get better . No weakness new numbness falling   vision harder to   Focusing thing  But no diplopia   Bp needs refill hasnt checked readings  Better at leaving ed .  Refill pravastatin had myalgias with lipitor  RLS legs still problematic  Has to move   And tingling problematic .refill med  Uncertain if really helping  Bp 125 range  And no recent check ROS: See pertinent positives and negatives per HPI.no cp new sob   Past Medical History  Diagnosis Date  . IBS (irritable bowel syndrome)   . Thyroid goiter     I 131 rx and then  total throidectomy 2005 [facility]  . GERD (gastroesophageal reflux disease)   . Dyspnea     nl PFTs, and echo  . Recurrent oral ulcers   . Heart murmur   . Asthma   . Headache(784.0)   . History of kidney stones   . Nephrolithiasis   . Acute pyelonephritis 05/02/2012    Family History  Problem Relation Age of Onset  . Cancer Mother     bladder  . COPD Sister   . Coronary artery disease Sister     age 75 also copd  . Arthritis Other   . Cancer Other     breast  . Diabetes Other   . Hyperlipidemia Other   . Hypertension Other   . Stroke Other   . Heart disease Other   . Coronary artery disease Father     also had AAA  . Aneurysm Father     History   Social History  . Marital Status: Married    Spouse Name: N/A    Number of Children: N/A  . Years of Education: N/A   Social History Main Topics  . Smoking status: Never Smoker   . Smokeless tobacco: Never Used  . Alcohol Use: Yes     Comment: seldom  . Drug Use: No  . Sexual  Activity: None   Other Topics Concern  . 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 acetaminophen (TYLENOL) 500 MG tablet Take 1,000 mg by mouth every 6 (six) hours as needed for moderate pain.   Marland Kitchen levothyroxine (SYNTHROID, LEVOTHROID) 88 MCG tablet Take 88 mcg by mouth daily before breakfast.  . pantoprazole (PROTONIX) 40 MG tablet Take 40 mg by mouth 2 (two) times daily.  . pravastatin (PRAVACHOL) 40 MG tablet Take 1 tablet (40 mg total) by mouth every evening.  Marland Kitchen rOPINIRole (REQUIP) 0.5 MG tablet Take 1-2 tablets (0.5-1 mg total) by mouth 3 (three) times daily. Take 1 tab at 1200, 1 tab at 1700, and 2 tabs at bedtime.  . valsartan (DIOVAN) 80 MG tablet Take 1 tablet (80 mg total) by mouth every  morning.  . [DISCONTINUED] pravastatin (PRAVACHOL) 40 MG tablet Take 40 mg by mouth every evening.  . [DISCONTINUED] rOPINIRole (REQUIP) 0.5 MG tablet Take 0.5-1 mg by mouth 3 (three) times daily. Take 1 tab at 1200, 1 tab at 1700, and 2 tabs at bedtime.  . [DISCONTINUED] valsartan (DIOVAN) 80 MG tablet Take 80 mg by mouth every morning.    EXAM:  BP 148/84  Pulse 69  Temp(Src) 98.5 F (36.9 C) (Oral)  Ht 5' (1.524 m)  Wt 169 lb (76.658 kg)  BMI 33.01 kg/m2  SpO2 97%  Body mass index is 33.01 kg/(m^2). BP Readings from Last 3 Encounters:  08/01/13 148/84  07/18/13 150/86  07/16/13 124/79    GENERAL: vitals reviewed and listed above, alert, oriented, appears well hydrated and in no acute distress HEENT: atraumatic, conjunctiva  Clear eoms nl , no obvious abnormalities on inspection of external nose and ears  eacs with wax OP : no lesion edema or exudate tongue midline  NECK: no obvious masses on inspection palpation  CV: HRRR, no clubbing cyanosis or  peripheral edema nl cap refill  MS: moves all extremities  without noticeable focal  abnormality PSYCH: pleasant and cooperative, nl speech  Neuro cn 3-12 grossly inatce oriented x 3  Gait nl heel to to slightly wobbling but good  difficult backward . Neg rhomberg no tremor and ftn nl    ASSESSMENT AND PLAN:  Discussed the following assessment and plan:  Mild concussion, without loss of consciousness, initial encounter  Head injury, unspecified  Fall, sequela  Unspecified essential hypertension  Restless legs syndrome (RLS)  Other and unspecified hyperlipidemia  Mild concussion, without loss of consciousness, sequela  -Patient advised to return or notify health care team  if symptoms worsen ,persist or new concerns arise.  Patient Instructions  Expect the  Headaches to be better in the next 3-4 weeks. Monitor BP  readings  To ensure that they are below 140/90 majority of time.  If continued elevated then we may increase the valsartan to 160 mg per day.  Contact us about increasing dose of medication. ROV in 1 month if not better or as needed. If still having symptoms .        Standley Brooking. Karmah Potocki M.D.

## 2013-08-01 NOTE — Patient Instructions (Signed)
Expect the  Headaches to be better in the next 3-4 weeks. Monitor BP  readings  To ensure that they are below 140/90 majority of time.  If continued elevated then we may increase the valsartan to 160 mg per day.  Contact us about increasing dose of medication. ROV in 1 month if not better or as needed. If still having symptoms .

## 2013-08-28 ENCOUNTER — Encounter: Payer: Self-pay | Admitting: Internal Medicine

## 2013-08-28 ENCOUNTER — Ambulatory Visit (INDEPENDENT_AMBULATORY_CARE_PROVIDER_SITE_OTHER): Payer: BC Managed Care – PPO | Admitting: Internal Medicine

## 2013-08-28 VITALS — BP 134/82 | Temp 97.9°F | Ht 60.0 in | Wt 166.0 lb

## 2013-08-28 DIAGNOSIS — G2581 Restless legs syndrome: Secondary | ICD-10-CM

## 2013-08-28 DIAGNOSIS — S069X9S Unspecified intracranial injury with loss of consciousness of unspecified duration, sequela: Secondary | ICD-10-CM

## 2013-08-28 DIAGNOSIS — S069XAS Unspecified intracranial injury with loss of consciousness status unknown, sequela: Secondary | ICD-10-CM

## 2013-08-28 DIAGNOSIS — I10 Essential (primary) hypertension: Secondary | ICD-10-CM

## 2013-08-28 DIAGNOSIS — S060X0S Concussion without loss of consciousness, sequela: Secondary | ICD-10-CM

## 2013-08-28 MED ORDER — ROPINIROLE HCL 0.5 MG PO TABS: ORAL_TABLET | ORAL | Status: DC

## 2013-08-28 MED ORDER — VALSARTAN 80 MG PO TABS: 160.0000 mg | ORAL_TABLET | Freq: Every morning | ORAL | Status: DC

## 2013-08-28 NOTE — Progress Notes (Signed)
Pre visit review using our clinic review tool, if applicable. No additional management support is needed unless otherwise documented below in the visit note.  Chief Complaint  Patient presents with  . Follow-up    HPI: [name] BP  Mostly 130 range at home .    This am had difficult news may be up from that  bp 120 - 130 at home  Concussion  Still ha annoying  Otherwise  lingering.  Doing a lot better  Though . RLS: problematic  Beginning  in am sometimes only taking 0.5 requip .   Hits her at any time .   Some leg cramps otherwise also    ROS: See pertinent positives and negatives per HPI. Cough drainage from grandkids runny noses  Past Medical History  Diagnosis Date  . IBS (irritable bowel syndrome)   . Thyroid goiter     I 131 rx and then  total throidectomy 2005 [facility]  . GERD (gastroesophageal reflux disease)   . Dyspnea     nl PFTs, and echo  . Recurrent oral ulcers   . Heart murmur   . Asthma   . Headache(784.0)   . History of kidney stones   . Nephrolithiasis   . Acute pyelonephritis 05/02/2012    Family History  Problem Relation Age of Onset  . Cancer Mother     bladder  . COPD Sister   . Coronary artery disease Sister     age 64 also copd  . Arthritis Other   . Cancer Other     breast  . Diabetes Other   . Hyperlipidemia Other   . Hypertension Other   . Stroke Other   . Heart disease Other   . Coronary artery disease Father     also had AAA  . Aneurysm Father     History   Social History  . Marital Status: Married    Spouse Name: N/A    Number of Children: N/A  . Years of Education: N/A   Social History Main Topics  . Smoking status: Never Smoker   . Smokeless tobacco: Never Used  . Alcohol Use: Yes     Comment: seldom  . Drug Use: No  . Sexual Activity: None   Other Topics Concern  . None   Social History Narrative   Married   Has grandchildren   Never smoked    G4P4   hh of 2  care of GC ages 8 - 54 month  5 days a week     Outpatient Encounter Prescriptions as of 08/28/2013  Medication Sig  . acetaminophen (TYLENOL) 500 MG tablet Take 1,000 mg by mouth every 6 (six) hours as needed for moderate pain.   Marland Kitchen levothyroxine (SYNTHROID, LEVOTHROID) 88 MCG tablet Take 88 mcg by mouth daily before breakfast.  . pantoprazole (PROTONIX) 40 MG tablet Take 40 mg by mouth 2 (two) times daily.  . pravastatin (PRAVACHOL) 40 MG tablet Take 1 tablet (40 mg total) by mouth every evening.  Marland Kitchen rOPINIRole (REQUIP) 0.5 MG tablet Take 1-2 tab at 1200, 1-2 tab at 1700, and 3 tabs at bedtime.  . valsartan (DIOVAN) 80 MG tablet Take 2 tablets (160 mg total) by mouth every morning.  . [DISCONTINUED] rOPINIRole (REQUIP) 0.5 MG tablet Take 1-2 tablets (0.5-1 mg total) by mouth 3 (three) times daily. Take 1 tab at 1200, 1 tab at 1700, and 2 tabs at bedtime.  . [DISCONTINUED] valsartan (DIOVAN) 80 MG tablet Take 1 tablet (80  mg total) by mouth every morning.    EXAM:  BP 134/82  Temp(Src) 97.9 F (36.6 C) (Oral)  Ht 5' (1.524 m)  Wt 166 lb (75.297 kg)  BMI 32.42 kg/m2  Body mass index is 32.42 kg/(m^2).  GENERAL: vitals reviewed and listed above, alert, oriented, appears well hydrated and in no acute distress HEENT: atraumatic, conjunctiva  clear, no obvious abnormalities on inspection of external nose and earsNECK: no obvious masses on inspection palpation  LUNGS: clear to auscultation bilaterally, no wheezes, rales or rhonchi,  CV: HRRR, no clubbing cyanosis or  peripheral edema nl cap refill  MS: moves all extremities without noticeable focal  abnormality PSYCH: pleasant and cooperative, no obvious depression or anxiety Neuro grossly non focal today  ASSESSMENT AND PLAN:  Discussed the following assessment and plan:  Unspecified essential hypertension - up and down trial of 160 valsartan for now  call for refill 160 if needed   Mild concussion, without loss of consciousness, sequela - better  minimal ha left   Restless  legs syndrome (RLS) - med dosing to adjust  this ma dose e 4 mg for now and inc to 1.5 mg at night or higher and 1 mg in day consider incrase night dose also . Plan rov in 2-3 month but if doing well can call and cancel and recheck at [facility]px in late December . Instead  -Patient advised to return or notify health care team  if symptoms worsen ,persist or new concerns arise.  Patient Instructions  Try 1.5 mg at night  For now and 1 mg in day  To see how this helps you legs  maximium dose for now 4 mg  .   Increase valsartan to 160 mg per day . ( take 2- 80 mg for now. ) .   ROV in 2 months or as needed.   Standley Brooking. Hillarie Harrigan M.D.

## 2013-08-28 NOTE — Patient Instructions (Signed)
Try 1.5 mg at night  For now and 1 mg in day  To see how this helps you legs  maximium dose for now 4 mg  .   Increase valsartan to 160 mg per day . ( take 2- 80 mg for now. ) .   ROV in 2 months or as needed.

## 2013-11-27 ENCOUNTER — Encounter: Payer: Self-pay | Admitting: Internal Medicine

## 2013-12-26 DIAGNOSIS — R7303 Prediabetes: Secondary | ICD-10-CM

## 2013-12-26 HISTORY — DX: Prediabetes: R73.03

## 2014-01-09 ENCOUNTER — Other Ambulatory Visit: Payer: Self-pay | Admitting: Internal Medicine

## 2014-01-09 NOTE — Telephone Encounter (Signed)
Sent to the pharmacy by e-scribe.  Pt has upcoming CPX on 01/23/14

## 2014-01-16 ENCOUNTER — Other Ambulatory Visit (INDEPENDENT_AMBULATORY_CARE_PROVIDER_SITE_OTHER): Payer: BC Managed Care – PPO

## 2014-01-16 DIAGNOSIS — Z0000 Encounter for general adult medical examination without abnormal findings: Secondary | ICD-10-CM

## 2014-01-16 LAB — CBC WITH DIFFERENTIAL/PLATELET
Basophils Absolute: 0 10*3/uL (ref 0.0–0.1)
Basophils Relative: 0.8 % (ref 0.0–3.0)
EOS PCT: 2.6 % (ref 0.0–5.0)
Eosinophils Absolute: 0.1 10*3/uL (ref 0.0–0.7)
HEMATOCRIT: 42.2 % (ref 36.0–46.0)
HEMOGLOBIN: 13.9 g/dL (ref 12.0–15.0)
LYMPHS ABS: 1.8 10*3/uL (ref 0.7–4.0)
LYMPHS PCT: 36.2 % (ref 12.0–46.0)
MCHC: 33 g/dL (ref 30.0–36.0)
MCV: 99.6 fl (ref 78.0–100.0)
MONOS PCT: 7.7 % (ref 3.0–12.0)
Monocytes Absolute: 0.4 10*3/uL (ref 0.1–1.0)
NEUTROS ABS: 2.6 10*3/uL (ref 1.4–7.7)
Neutrophils Relative %: 52.7 % (ref 43.0–77.0)
Platelets: 272 10*3/uL (ref 150.0–400.0)
RBC: 4.24 Mil/uL (ref 3.87–5.11)
RDW: 13.2 % (ref 11.5–15.5)
WBC: 4.9 10*3/uL (ref 4.0–10.5)

## 2014-01-16 LAB — LIPID PANEL
CHOL/HDL RATIO: 5
Cholesterol: 221 mg/dL — ABNORMAL HIGH (ref 0–200)
HDL: 41.8 mg/dL (ref >39.00)
NONHDL: 179.2
TRIGLYCERIDES: 251 mg/dL — AB (ref 0.0–149.0)
VLDL: 50.2 mg/dL — ABNORMAL HIGH (ref 0.0–40.0)

## 2014-01-16 LAB — HEPATIC FUNCTION PANEL
ALK PHOS: 50 U/L (ref 39–117)
ALT: 47 U/L — AB (ref 0–35)
AST: 28 U/L (ref 0–37)
Albumin: 4.2 g/dL (ref 3.5–5.2)
Bilirubin, Direct: 0.1 mg/dL (ref 0.0–0.3)
Total Bilirubin: 1.1 mg/dL (ref 0.2–1.2)
Total Protein: 6.7 g/dL (ref 6.0–8.3)

## 2014-01-16 LAB — BASIC METABOLIC PANEL
BUN: 19 mg/dL (ref 6–23)
CALCIUM: 9 mg/dL (ref 8.4–10.5)
CO2: 27 mEq/L (ref 19–32)
Chloride: 104 mEq/L (ref 96–112)
Creatinine, Ser: 0.7 mg/dL (ref 0.4–1.2)
GFR: 85.44 mL/min (ref >60.00)
Glucose, Bld: 101 mg/dL — ABNORMAL HIGH (ref 70–99)
POTASSIUM: 4.4 meq/L (ref 3.5–5.1)
Sodium: 139 mEq/L (ref 135–145)

## 2014-01-16 LAB — HEMOGLOBIN A1C: HEMOGLOBIN A1C: 6.3 % (ref 4.6–6.5)

## 2014-01-16 LAB — TSH: TSH: 2.05 u[IU]/mL (ref 0.35–4.50)

## 2014-01-16 LAB — LDL CHOLESTEROL, DIRECT: LDL DIRECT: 131.5 mg/dL

## 2014-01-16 NOTE — Addendum Note (Signed)
Addended by: Elmer Picker on: 01/16/2014 08:34 AM   Modules accepted: Orders

## 2014-01-16 NOTE — Addendum Note (Signed)
Addended by: Elmer Picker on: 01/16/2014 08:36 AM   Modules accepted: Orders

## 2014-01-23 ENCOUNTER — Ambulatory Visit (INDEPENDENT_AMBULATORY_CARE_PROVIDER_SITE_OTHER): Payer: BC Managed Care – PPO | Admitting: Internal Medicine

## 2014-01-23 ENCOUNTER — Encounter: Payer: Self-pay | Admitting: Internal Medicine

## 2014-01-23 VITALS — BP 130/70 | Temp 98.5°F | Ht 60.0 in | Wt 167.0 lb

## 2014-01-23 DIAGNOSIS — R7989 Other specified abnormal findings of blood chemistry: Secondary | ICD-10-CM

## 2014-01-23 DIAGNOSIS — R945 Abnormal results of liver function studies: Secondary | ICD-10-CM

## 2014-01-23 DIAGNOSIS — Z2821 Immunization not carried out because of patient refusal: Secondary | ICD-10-CM

## 2014-01-23 DIAGNOSIS — E785 Hyperlipidemia, unspecified: Secondary | ICD-10-CM

## 2014-01-23 DIAGNOSIS — Z0000 Encounter for general adult medical examination without abnormal findings: Secondary | ICD-10-CM

## 2014-01-23 DIAGNOSIS — R202 Paresthesia of skin: Secondary | ICD-10-CM

## 2014-01-23 DIAGNOSIS — E039 Hypothyroidism, unspecified: Secondary | ICD-10-CM

## 2014-01-23 DIAGNOSIS — R7301 Impaired fasting glucose: Secondary | ICD-10-CM

## 2014-01-23 DIAGNOSIS — R05 Cough: Secondary | ICD-10-CM

## 2014-01-23 DIAGNOSIS — R053 Chronic cough: Secondary | ICD-10-CM

## 2014-01-23 NOTE — Patient Instructions (Addendum)
lifestyle intervention healthy eating and exercise . You have risk for developing diabetes.  Try flonase every day for the cough drainage  chest is clear today  Healthy lifestyle includes : At least 150 minutes of exercise weeks  , weight at healthy levels, which is usually   BMI 19-25. Avoid trans fats and processed foods;  Increase fresh fruits and veges to 5 servings per day. And avoid sweet beverages including tea and juice. Mediterranean diet with olive oil and nuts have been noted to be heart and brain healthy . Avoid tobacco products . Limit  alcohol to  7 per week for women and 14 servings for men.  Get adequate sleep . Wear seat belts . Don't text and drive .   Advise flu vaccine to protect the baby.   Doesn't.  give the flu   Will do nutrition referral  Consider joint  the [facility]  . We can do the refertral

## 2014-01-23 NOTE — Progress Notes (Signed)
Pre visit review using our clinic review tool, if applicable. No additional management support is needed unless otherwise documented below in the visit note.  Chief Complaint  Patient presents with  . Annual Exam  . Hypertension  . Hyperlipidemia    HPI: Patient  [name]  63 y.o. comes in today for Preventive Health Care visit adn Chronic disease management  HT no prob med LIPIDS on meds RLS better on in dose of med  Some  Decline flu vaccine "causes Flu"   Had cold and cough still  There after a month no sob some PND  Health Maintenance  Topic Date Due  . INFLUENZA VACCINE  04/26/2014 (Originally 08/26/2013)  . MAMMOGRAM  12/13/2014  . COLONOSCOPY  08/30/2022  . TETANUS/TDAP  01/25/2023  . ZOSTAVAX  Completed   Health Maintenance Review LIFESTYLE:  Exercise:  Not enough recnetlyu Tobacco/ETS:no Alcohol: no Sugar beverages: Sleep:ok Drug use: no Colonoscopy: UTD PAP:NA MAMMO:to get    ROS:  GEN/ HEENT: No fever, significant weight changes sweats headaches vision problems hearing changes, CV/ PULM; No chest pain shortness of breath cough, syncope,edema  change in exercise tolerance. GI /GU: No adominal pain, vomiting, change in bowel habits. No blood in the stool. No significant GU symptoms. SKIN/HEME: ,no acute skin rashes suspicious lesions or bleeding. No lymphadenopathy, nodules, masses.  NEURO/ PSYCH:  No NEWneurologic signs such as weakness . No depression anxiety. IMM/ Allergy: No unusual infections.  Allergy .   REST of 12 system review negative except as per HPI   Past Medical History  Diagnosis Date  . IBS (irritable bowel syndrome)   . Thyroid goiter     I 131 rx and then  total throidectomy 2005 [facility]  . GERD (gastroesophageal reflux disease)   . Dyspnea     nl PFTs, and echo  . Recurrent oral ulcers   . Heart murmur   . Asthma   . Headache(784.0)   . History of kidney stones   . Nephrolithiasis   . Acute pyelonephritis 05/02/2012     Past Surgical History  Procedure Laterality Date  . Total thyroidectomy  2005    for  large nodule 2006  . Abdominal hysterectomy  1994    fibroid  . Appendectomy  1968  . Tonsillectomy  1979  . Orif left tivial plateau fracture      Dr. Collier Salina 2/08  . Plates and pins take out of left knee  06/2008    Family History  Problem Relation Age of Onset  . Cancer Mother     bladder  . COPD Sister   . Coronary artery disease Sister     age 18 also copd  . Arthritis Other   . Cancer Other     breast  . Diabetes Other   . Hyperlipidemia Other   . Hypertension Other   . Stroke Other   . Heart disease Other   . Coronary artery disease Father     also had AAA  . Aneurysm Father     History   Social History  . Marital Status: Married    Spouse Name: N/A    Number of Children: N/A  . Years of Education: N/A   Social History Main Topics  . Smoking status: Never Smoker   . Smokeless tobacco: Never Used  . Alcohol Use: Yes     Comment: seldom  . Drug Use: No  . Sexual Activity: None   Other Topics Concern  . None  Social History Narrative   Married   Has grandchildren   Never smoked    G4P4   hh of 2  care of GC ages 34 - 34 month  5 days a week    Outpatient Encounter Prescriptions as of 01/23/2014  Medication Sig  . acetaminophen (TYLENOL) 500 MG tablet Take 1,000 mg by mouth every 6 (six) hours as needed for moderate pain.   Marland Kitchen levothyroxine (SYNTHROID, LEVOTHROID) 88 MCG tablet Take 88 mcg by mouth daily before breakfast.  . pantoprazole (PROTONIX) 40 MG tablet Take 40 mg by mouth 2 (two) times daily.  . pravastatin (PRAVACHOL) 40 MG tablet Take 1 tablet (40 mg total) by mouth every evening.  Marland Kitchen rOPINIRole (REQUIP) 0.5 MG tablet Take 1-2 tab at 1200, 1-2 tab at 1700, and 3 tabs at bedtime.  Marland Kitchen SYNTHROID 88 MCG tablet TAKE 1 TABLET BY MOUTH ONCE DAILY  . valsartan (DIOVAN) 80 MG tablet Take 2 tablets (160 mg total) by mouth every morning.    EXAM:  BP  130/70 mmHg  Temp(Src) 98.5 F (36.9 C) (Oral)  Ht 5' (1.524 m)  Wt 167 lb (75.751 kg)  BMI 32.62 kg/m2  Body mass index is 32.62 kg/(m^2).  Physical Exam: Vital signs reviewed ASN:KNLZ is a well-developed well-nourished alert cooperative    who appearsr stated age in no acute distress.  HEENT: normocephalic atraumatic , Eyes: PERRL EOM's full, conjunctiva clear, Nares: paten,t no deformity discharge or tenderness., Ears: no deformity EAC's clear TMs with normal landmarks. Mouth: clear OP, no lesions, edema.  Moist mucous membranes. Dentition in adequate repair. NECK: supple without masses, thyromegaly or bruits. CHEST/PULM:  Clear to auscultation and percussion breath sounds equal no wheeze , rales or rhonchi. No chest wall deformities or tenderness. Breast: normal by inspection . No dimpling, discharge, masses, tenderness or discharge . CV: PMI is nondisplaced, S1 S2 no gallops, murmurs, rubs. Peripheral pulses are full without delay.No JVD .  ABDOMEN: Bowel sounds normal nontender  No guard or rebound, no hepato splenomegal no CVA tenderness.  No hernia. Extremtities:  No clubbing cyanosis or edema, no acute joint swelling or redness no focal atrophy NEURO:  Oriented x3, cranial nerves 3-12 appear to be intact, no obvious focal weakness,gait within normal limits no abnormal reflexes or asymmetrical SKIN: No acute rashes normal turgor, color, no bruising or petechiae. PSYCH: Oriented, good eye contact, no obvious depression anxiety, cognition and judgment appear normal. LN: no cervical axillary inguinal adenopathy  Lab Results  Component Value Date   WBC 4.9 01/16/2014   HGB 13.9 01/16/2014   HCT 42.2 01/16/2014   PLT 272.0 01/16/2014   GLUCOSE 101* 01/16/2014   CHOL 221* 01/16/2014   TRIG 251.0* 01/16/2014   HDL 41.80 01/16/2014   LDLDIRECT 131.5 01/16/2014   LDLCALC 118* 01/13/2013   ALT 47* 01/16/2014   AST 28 01/16/2014   NA 139 01/16/2014   K 4.4 01/16/2014   CL 104  01/16/2014   CREATININE 0.7 01/16/2014   BUN 19 01/16/2014   CO2 27 01/16/2014   TSH 2.05 01/16/2014   INR 0.97 10/27/2010   HGBA1C 6.3 01/16/2014   Body mass index is 32.62 kg/(m^2). Wt Readings from Last 3 Encounters:  01/23/14 167 lb (75.751 kg)  08/28/13 166 lb (75.297 kg)  08/01/13 169 lb (76.658 kg)    ASSESSMENT AND PLAN:  Discussed the following assessment and plan:  Visit for preventive health examination  Fasting hyperglycemia - prediebetes disc  refer to PREP - Plan:  Amb ref to Medical Nutrition Therapy-MNT  Hyperlipidemia - Plan: Amb ref to Medical Nutrition Therapy-MNT  Hypothyroidism, unspecified hypothyroidism type - no change  Influenza vaccination declined - counseled  protectino for infant gc  Tingling in extremities - on going   Abnormal LFTs - prob fatty liver see abd ct 2014  Cough, persistent - trial flonase  Patient Care Team: Burnis Medin, MD as PCP - General Sallyanne Havers, MD as Referring Physician (Orthopedic Surgery) Arvil Persons, MD as Attending Physician (Urology) Josue Hector, MD as Consulting Physician (Cardiology) Patient Instructions   lifestyle intervention healthy eating and exercise . You have risk for developing diabetes.  Try flonase every day for the cough drainage  chest is clear today  Healthy lifestyle includes : At least 150 minutes of exercise weeks  , weight at healthy levels, which is usually   BMI 19-25. Avoid trans fats and processed foods;  Increase fresh fruits and veges to 5 servings per day. And avoid sweet beverages including tea and juice. Mediterranean diet with olive oil and nuts have been noted to be heart and brain healthy . Avoid tobacco products . Limit  alcohol to  7 per week for women and 14 servings for men.  Get adequate sleep . Wear seat belts . Don't text and drive .   Advise flu vaccine to protect the baby.   Doesn't.  give the flu   Will do nutrition referral  Consider joint  the [facility]  . We can do the refertral   Wanda K. Panosh M.D.

## 2014-01-30 ENCOUNTER — Encounter (HOSPITAL_COMMUNITY): Payer: Self-pay | Admitting: Emergency Medicine

## 2014-01-30 ENCOUNTER — Emergency Department (HOSPITAL_COMMUNITY)
Admission: EM | Admit: 2014-01-30 | Discharge: 2014-01-30 | Disposition: A | Discharge status: Home / Self Care | Payer: BLUE CROSS/BLUE SHIELD | Attending: Emergency Medicine | Admitting: Emergency Medicine

## 2014-01-30 ENCOUNTER — Emergency Department (HOSPITAL_COMMUNITY): Payer: BLUE CROSS/BLUE SHIELD

## 2014-01-30 ENCOUNTER — Ambulatory Visit: Payer: Self-pay | Admitting: Family Medicine

## 2014-01-30 ENCOUNTER — Telehealth: Payer: Self-pay | Admitting: Internal Medicine

## 2014-01-30 DIAGNOSIS — R011 Cardiac murmur, unspecified: Secondary | ICD-10-CM | POA: Diagnosis not present

## 2014-01-30 DIAGNOSIS — J45901 Unspecified asthma with (acute) exacerbation: Secondary | ICD-10-CM | POA: Diagnosis not present

## 2014-01-30 DIAGNOSIS — Z79899 Other long term (current) drug therapy: Secondary | ICD-10-CM | POA: Insufficient documentation

## 2014-01-30 DIAGNOSIS — Z87442 Personal history of urinary calculi: Secondary | ICD-10-CM | POA: Diagnosis not present

## 2014-01-30 DIAGNOSIS — R05 Cough: Secondary | ICD-10-CM | POA: Diagnosis present

## 2014-01-30 DIAGNOSIS — K219 Gastro-esophageal reflux disease without esophagitis: Secondary | ICD-10-CM | POA: Diagnosis not present

## 2014-01-30 DIAGNOSIS — E049 Nontoxic goiter, unspecified: Secondary | ICD-10-CM | POA: Insufficient documentation

## 2014-01-30 DIAGNOSIS — Z8742 Personal history of other diseases of the female genital tract: Secondary | ICD-10-CM | POA: Diagnosis not present

## 2014-01-30 DIAGNOSIS — J209 Acute bronchitis, unspecified: Secondary | ICD-10-CM

## 2014-01-30 LAB — CBC
HEMATOCRIT: 39.7 % (ref 36.0–46.0)
HEMOGLOBIN: 13.6 g/dL (ref 12.0–15.0)
MCH: 33.2 pg (ref 26.0–34.0)
MCHC: 34.3 g/dL (ref 30.0–36.0)
MCV: 96.8 fL (ref 78.0–100.0)
Platelets: 180 10*3/uL (ref 150–400)
RBC: 4.1 MIL/uL (ref 3.87–5.11)
RDW: 13.1 % (ref 11.5–15.5)
WBC: 10.2 10*3/uL (ref 4.0–10.5)

## 2014-01-30 LAB — BASIC METABOLIC PANEL
ANION GAP: 9 (ref 5–15)
BUN: 13 mg/dL (ref 6–23)
CHLORIDE: 102 meq/L (ref 96–112)
CO2: 27 mmol/L (ref 19–32)
Calcium: 8.7 mg/dL (ref 8.4–10.5)
Creatinine, Ser: 0.86 mg/dL (ref 0.50–1.10)
GFR calc Af Amer: 82 mL/min — ABNORMAL LOW (ref >90)
GFR calc non Af Amer: 70 mL/min — ABNORMAL LOW (ref >90)
Glucose, Bld: 120 mg/dL — ABNORMAL HIGH (ref 70–99)
POTASSIUM: 3.9 mmol/L (ref 3.5–5.1)
SODIUM: 138 mmol/L (ref 135–145)

## 2014-01-30 LAB — I-STAT TROPONIN, ED: TROPONIN I, POC: 0.01 ng/mL (ref 0.00–0.08)

## 2014-01-30 MED ORDER — ACETAMINOPHEN 500 MG PO TABS
1000.0000 mg | ORAL_TABLET | Freq: Once | ORAL | Status: AC
  Administered 2014-01-30: 1000 mg via ORAL
  Filled 2014-01-30: qty 2

## 2014-01-30 MED ORDER — HYDROCOD POLST-CHLORPHEN POLST 10-8 MG/5ML PO LQCR: 5.0000 mL | Freq: Two times a day (BID) | ORAL | Status: DC | PRN

## 2014-01-30 MED ORDER — LEVOFLOXACIN 750 MG PO TABS: 750.0000 mg | ORAL_TABLET | Freq: Every day | ORAL | Status: DC

## 2014-01-30 MED ORDER — ALBUTEROL SULFATE (2.5 MG/3ML) 0.083% IN NEBU
5.0000 mg | INHALATION_SOLUTION | Freq: Once | RESPIRATORY_TRACT | Status: AC
  Administered 2014-01-30: 5 mg via RESPIRATORY_TRACT
  Filled 2014-01-30: qty 6

## 2014-01-30 NOTE — ED Notes (Signed)
Wheezing cough noted. C/o back pain with cough. Pt reports :productive cough x 4 days, intermittent cough x 4 weeks. Intermittent fever and shortness of breath x 4 days

## 2014-01-30 NOTE — Discharge Instructions (Signed)
Levaquin as prescribed.  Tussionex prescribed as needed for cough.  Ibuprofen 600 mg every 6 hours as needed for pain or fever.  Old with your primary Dr. if not improving in the next 2-3 days, and return to the ER if your symptoms worsen or change.   Acute Bronchitis Bronchitis is inflammation of the airways that extend from the windpipe into the lungs (bronchi). The inflammation often causes mucus to develop. This leads to a cough, which is the most common symptom of bronchitis.  In acute bronchitis, the condition usually develops suddenly and goes away over time, usually in a couple weeks. Smoking, allergies, and asthma can make bronchitis worse. Repeated episodes of bronchitis may cause further lung problems.  CAUSES Acute bronchitis is most often caused by the same virus that causes a cold. The virus can spread from person to person (contagious) through coughing, sneezing, and touching contaminated objects. SIGNS AND SYMPTOMS   Cough.   Fever.   Coughing up mucus.   Body aches.   Chest congestion.   Chills.   Shortness of breath.   Sore throat.  DIAGNOSIS  Acute bronchitis is usually diagnosed through a physical exam. Your health care provider will also ask you questions about your medical history. Tests, such as chest X-rays, are sometimes done to rule out other conditions.  TREATMENT  Acute bronchitis usually goes away in a couple weeks. Oftentimes, no medical treatment is necessary. Medicines are sometimes given for relief of fever or cough. Antibiotic medicines are usually not needed but may be prescribed in certain situations. In some cases, an inhaler may be recommended to help reduce shortness of breath and control the cough. A cool mist vaporizer may also be used to help thin bronchial secretions and make it easier to clear the chest.  HOME CARE INSTRUCTIONS  Get plenty of rest.   Drink enough fluids to keep your urine clear or pale yellow (unless you have  a medical condition that requires fluid restriction). Increasing fluids may help thin your respiratory secretions (sputum) and reduce chest congestion, and it will prevent dehydration.   Take medicines only as directed by your health care provider.  If you were prescribed an antibiotic medicine, finish it all even if you start to feel better.  Avoid smoking and secondhand smoke. Exposure to cigarette smoke or irritating chemicals will make bronchitis worse. If you are a smoker, consider using nicotine gum or skin patches to help control withdrawal symptoms. Quitting smoking will help your lungs heal faster.   Reduce the chances of another bout of acute bronchitis by washing your hands frequently, avoiding people with cold symptoms, and trying not to touch your hands to your mouth, nose, or eyes.   Keep all follow-up visits as directed by your health care provider.  SEEK MEDICAL CARE IF: Your symptoms do not improve after 1 week of treatment.  SEEK IMMEDIATE MEDICAL CARE IF:  You develop an increased fever or chills.   You have chest pain.   You have severe shortness of breath.  You have bloody sputum.   You develop dehydration.  You faint or repeatedly feel like you are going to pass out.  You develop repeated vomiting.  You develop a severe headache. MAKE SURE YOU:   Understand these instructions.  Will watch your condition.  Will get help right away if you are not doing well or get worse. Document Released: 02/20/2004 Document Revised: 05/29/2013 Document Reviewed: 07/05/2012 [facility] Patient Information 2015 [place], [place]. This information is  not intended to replace advice given to you by your health care provider. Make sure you discuss any questions you have with your health care provider.

## 2014-01-30 NOTE — ED Provider Notes (Signed)
CSN: [id number]     Arrival date & time 01/30/14  1020 History   First MD Initiated Contact with Patient 01/30/14 1135     Chief Complaint  Patient presents with  . Cough    cough x 4 weeks  . Fever    intermittent x 4 days-101.0  . Shortness of Breath    x 4 days  . Back Pain    mid back pain     (Consider location/radiation/quality/duration/timing/severity/associated sxs/prior Treatment) HPI Comments: Patient is a 64 year old female with history of irritable bowel, kidney stones. She presents with complaints of chest congestion and productive cough she tells me has been ongoing for the past several weeks. It is been worse over the past several days. She's been having fever and reports feeling short of breath.  Patient is a 64 y.o. female presenting with cough. The history is provided by the patient.  Cough Cough characteristics:  Productive Sputum characteristics:  Green Severity:  Moderate Onset quality:  Gradual Duration:  3 weeks Timing:  Constant Progression:  Worsening Chronicity:  New Smoker: no   Relieved by:  Nothing Worsened by:  Nothing tried Ineffective treatments:  None tried   Past Medical History  Diagnosis Date  . IBS (irritable bowel syndrome)   . Thyroid goiter     I 131 rx and then  total throidectomy 2005 [facility]  . GERD (gastroesophageal reflux disease)   . Dyspnea     nl PFTs, and echo  . Recurrent oral ulcers   . Heart murmur   . Asthma   . Headache(784.0)   . History of kidney stones   . Nephrolithiasis   . Acute pyelonephritis 05/02/2012   Past Surgical History  Procedure Laterality Date  . Total thyroidectomy  2005    for  large nodule 2006  . Abdominal hysterectomy  1994    fibroid  . Appendectomy  1968  . Tonsillectomy  1979  . Orif left tivial plateau fracture      Dr. Collier Salina 2/08  . Plates and pins take out of left knee  06/2008   Family History  Problem Relation Age of Onset  . Cancer Mother     bladder  . COPD Sister    . Coronary artery disease Sister     age 43 also copd  . Arthritis Other   . Cancer Other     breast  . Diabetes Other   . Hyperlipidemia Other   . Hypertension Other   . Stroke Other   . Heart disease Other   . Coronary artery disease Father     also had AAA  . Aneurysm Father    History  Substance Use Topics  . Smoking status: Never Smoker   . Smokeless tobacco: Never Used  . Alcohol Use: Yes     Comment: seldom   OB History    Gravida Para Term Preterm AB TAB SAB Ectopic Multiple Living   4 4             Review of Systems  Respiratory: Positive for cough.   All other systems reviewed and are negative.     Allergies  Propofol and Ritalin  Home Medications   Prior to Admission medications   Medication Sig Start Date End Date Taking? Authorizing Provider  acetaminophen (TYLENOL) 500 MG tablet Take 1,000 mg by mouth every 6 (six) hours as needed for moderate pain.    Yes Historical Provider, MD  pantoprazole (PROTONIX) 40 MG tablet Take 40  mg by mouth 2 (two) times daily.   Yes Historical Provider, MD  pravastatin (PRAVACHOL) 40 MG tablet Take 1 tablet (40 mg total) by mouth every evening. 08/01/13  Yes Burnis Medin, MD  rOPINIRole (REQUIP) 0.5 MG tablet Take 1-2 tab at 1200, 1-2 tab at 1700, and 3 tabs at bedtime. 08/28/13  Yes Burnis Medin, MD  SYNTHROID 88 MCG tablet TAKE 1 TABLET BY MOUTH ONCE DAILY 01/09/14  Yes Burnis Medin, MD  valsartan (DIOVAN) 80 MG tablet Take 2 tablets (160 mg total) by mouth every morning. 08/28/13  Yes Burnis Medin, MD   BP 140/84 mmHg  Pulse 121  Temp(Src) 100.9 F (38.3 C) (Oral)  Wt 165 lb (74.844 kg)  SpO2 94% Physical Exam  Constitutional: She is oriented to person, place, and time. She appears well-developed and well-nourished. No distress.  HENT:  Head: Normocephalic and atraumatic.  Neck: Normal range of motion. Neck supple.  Cardiovascular: Normal rate and regular rhythm.  Exam reveals no gallop and no friction rub.    No murmur heard. Pulmonary/Chest: Effort normal and breath sounds normal. No respiratory distress. She has no wheezes.  Abdominal: Soft. Bowel sounds are normal. She exhibits no distension. There is no tenderness.  Musculoskeletal: Normal range of motion. She exhibits no edema.  There is no calf swelling or tenderness. Homans sign is absent bilaterally.  Neurological: She is alert and oriented to person, place, and time.  Skin: Skin is warm and dry. She is not diaphoretic.  Nursing note and vitals reviewed.   ED Course  Procedures (including critical care time) Labs Review Labs Reviewed  BASIC METABOLIC PANEL  CBC  I-STAT [place], ED    Imaging Review Dg Chest 2 View (if Patient Has Fever And/or Copd)  01/30/2014   CLINICAL DATA:  Cough for about 4 weeks, congestion, wheezing for 4 days  EXAM: CHEST  2 VIEW  COMPARISON:  06/15/2013  FINDINGS: Cardiomediastinal silhouette is stable. No acute infiltrate or pleural effusion. No pulmonary edema. Mild perihilar and infrahilar bronchitic changes. Best seen on lateral view. Bony thorax is unremarkable.  IMPRESSION: No acute infiltrate or pleural effusion. Mild perihilar and infrahilar bronchitic changes.   Electronically Signed   By: Lahoma Crocker M.D.   On: 01/30/2014 11:10     EKG Interpretation   Date/Time:  Tuesday January 30 2014 10:45:15 EST Ventricular Rate:  110 PR Interval:  146 QRS Duration: 87 QT Interval:  320 QTC Calculation: 433 R Axis:   25 Text Interpretation:  Sinus tachycardia Low voltage, precordial leads RSR'  in V1 or V2, right VCD or RVH Borderline T abnormalities, anterior leads  Baseline wander in lead(s) I II III aVR aVF Confirmed by DELOS  MD,  Bejamin Hackbart ([id number]) on 01/30/2014 3:43:43 PM      MDM   Final diagnoses:  None    Workup reveals no evidence for pneumonia.  His symptoms have been present for several weeks and worsening, she will be treated with antibiotics, cough syrup, and when necessary  return.   Veryl Speak, MD 01/31/14 [phone]

## 2014-01-31 NOTE — Telephone Encounter (Signed)
PLEASE NOTE: All timestamps contained within this report are represented as [place] Standard Time. CONFIDENTIALTY NOTICE: This fax transmission is intended only for the addressee. It contains information that is legally privileged, confidential or otherwise protected from use or disclosure. If you are not the intended recipient, you are strictly prohibited from reviewing, disclosing, copying using or disseminating any of this information or taking any action in reliance on or regarding this information. If you have received this fax in error, please notify us immediately by telephone so that we can arrange for its return to [place]. Phone: [phone], Toll-Free: [phone], Fax: [phone] Page: 1 of 2 Call Id: [id number] [facility] - Client [place] Patient Name: [name] Gender: Female DOB: [birth year] Age: 64 Y 45 M 12 D Return Phone Number: [phone] (Primary) Address: City/State/Zip: [place] Client [facility] - Client Client [facility] [place] - Day Physician Shanon Ace Contact Type Call Call Type Triage / [place] Name Apolonio Schneiders Relationship To Patient Self Return Phone Number [phone] (Primary) Chief Complaint BREATHING - shortness of breath or sounds breathless Initial Comment Caller states she is having trouble breathing. Coughing. PreDisposition Call Doctor Nurse Assessment Nurse: Julien Girt, RN, Almyra Free Date/Time Eilene Ghazi Time): 01/30/2014 9:33:19 AM Confirm and document reason for call. If symptomatic, describe symptoms. ---Caller states she has been coughing for the last 2 weeks, got worse Friday and she has been in bed. States she is beginning to have sob. Adds she was in the clinic last week for a physical, she was coughing then, but her lungs were clear. She is wheezing, she had fever up to 101 since Saturday. She has an inhaler, has not used it  and thinks it might be out of date. Has the patient traveled out of the country within the last 30 days? ---Not Applicable Does the patient require triage? ---Yes Related visit to physician within the last 2 weeks? ---No Physical Exam Does the PT have any chronic conditions? (i.e. diabetes, asthma, etc.) ---Yes List chronic conditions. ---asthma, htn, hypothyroid Guidelines Guideline Title Affirmed Question Affirmed Notes Nurse Date/Time (Eastern Time) Asthma Attack [1] Severe wheezing or coughing AND [2] doesn't have neb or inhaler available Chancy Hurter 01/30/2014 9:37:00 AM Disp. Time Eilene Ghazi Time) Disposition Final User 01/30/2014 9:32:23 AM Send to Urgent Queue Baruch Goldmann 01/30/2014 9:39:03 AM Go to ED Now Yes Julien Girt, RN, Sheilah Mins NOTE: All timestamps contained within this report are represented as [place] Standard Time. CONFIDENTIALTY NOTICE: This fax transmission is intended only for the addressee. It contains information that is legally privileged, confidential or otherwise protected from use or disclosure. If you are not the intended recipient, you are strictly prohibited from reviewing, disclosing, copying using or disseminating any of this information or taking any action in reliance on or regarding this information. If you have received this fax in error, please notify us immediately by telephone so that we can arrange for its return to [place]. Phone: [phone], Toll-Free: [phone], Fax: [phone] Page: 2 of 2 Call Id: [id number] Caller Understands: Yes Disagree/Comply: Comply Care Advice Given Per Guideline GO TO ED NOW: You need to be seen in the Emergency Department. Go to the ER at ___________ [place] now. Drive carefully. * Another adult should drive. * Please bring a list of your current medicines when you go to the Emergency Department (ER). CARE ADVICE given per Asthma Attack (Adult) guideline. After Care Instructions Given Call Event Type User  Date / Time Description Comments User: Eloisa Northern, RN Date/Time Eilene Ghazi Time): 01/30/2014 9:40:08 AM Caller stated she would call her dtr to come take her to the ED, but disconnected before I could inquire about location. Referrals GO TO FACILITY UNDECIDED

## 2014-02-01 ENCOUNTER — Telehealth: Payer: Self-pay | Admitting: Internal Medicine

## 2014-02-01 NOTE — Telephone Encounter (Signed)
Pt notified to contact [facility] for advise. Pt stated cipro causes nausea. Pt notified to call back for appt if necessary.

## 2014-02-01 NOTE — Telephone Encounter (Signed)
Pt went er [facility] on tues and was given abx levofloxacine 750 mg for bronchitis and the abx is making her sick. Please advise. [facility]

## 2014-02-01 NOTE — Telephone Encounter (Signed)
Advise she contact the facility [facility] about advise  Because they prescribe the  medicine .  X ray review shows no pneumonia .  Document what "making sick means"  .   To decide if allergic or side effect  And to pu on  Adverse effect list.   If needed we can work her in tomorrow at the 215 appt saved for simple acutes . I

## 2014-02-19 ENCOUNTER — Ambulatory Visit: Payer: Self-pay | Admitting: *Deleted

## 2014-02-23 ENCOUNTER — Ambulatory Visit: Payer: Self-pay | Admitting: Dietician

## 2014-03-02 ENCOUNTER — Encounter: Payer: Self-pay | Admitting: Dietician

## 2014-03-02 ENCOUNTER — Encounter: Payer: BLUE CROSS/BLUE SHIELD | Attending: Internal Medicine | Admitting: Dietician

## 2014-03-02 VITALS — Ht 60.0 in | Wt 170.0 lb

## 2014-03-02 DIAGNOSIS — R7309 Other abnormal glucose: Secondary | ICD-10-CM | POA: Insufficient documentation

## 2014-03-02 DIAGNOSIS — R7303 Prediabetes: Secondary | ICD-10-CM

## 2014-03-02 DIAGNOSIS — Z713 Dietary counseling and surveillance: Secondary | ICD-10-CM | POA: Diagnosis not present

## 2014-03-02 NOTE — Patient Instructions (Signed)
Plan:  Aim for 2-3 Carb Choices per meal (30 grams) +/- 1 either way  Aim for 0-1 Carbs per snack if hungry  Include protein in moderation with your meals and snacks Consider reading food labels for Total Carbohydrate and Fat Grams of foods Consider  increasing your activity level by walking or going to the [facility] for 30 minutes daily as tolerated Rethink your drinks

## 2014-03-02 NOTE — Progress Notes (Signed)
  Medical Nutrition Therapy:  Appt start time: 1500 end time:  1615.   Assessment:  Primary concerns today: what to eat and what not to eat.  Patient was diagnosed with pre diabetes with a HgbA1C of 6.3% in 01/16/14.  Patient would like to lose weight.  Patient is here alone.  She stays at home and cares for grandchildren.  Husband is getting ready to retire and they plan on traveling.    Patient lost weight in 2010 with Weight Watchers but regained this.  Stated that writing down what she ate was very helpful in reducing portions.  Weight increased in 2008 after fx leg.  UBW prior to this was 118 lbs.  Was very active prior to fx leg and now unable to exercise as much.  Preferred Learning Style:  No preference indicated   Learning Readiness:   Ready  MEDICATIONS: see list   DIETARY INTAKE: Everyday foods include bread and potatoes.   24-hr recall:  B ( AM): 1 or 2 boiled egg or  2 cups cereal and milk with coffee made with 1 tsp sugar and 1 creamer Snk ( AM): occasionally  L ( PM): mac and cheese and hot dogs or fish sticks and french fries or sandwich or tacos or left overs Snk ( PM): apple or cheeze its or goldfish or grapes D ( PM): hamburger steak, mashed potatoes and beans ir  Steak, vege or salad and baked potato or fish, starch and vege with bread at times. Snk ( PM): ice cream or cheese its or cookie but not hungry Beverages: water, coffee with 1 tsp sugar and 1 cream, unsweet tea and when out 50/50 regular and unsweet tea, soda occasionally (2x/week), OJ or cranberry juice.  Usual physical activity: currently none.  Can walk without pain and has just spoken to a trainer at the [facility].  Water park in the summer.  Estimated energy needs: 1200 calories 135 g carbohydrates 90 g protein 33 g fat  Progress Towards Goal(s):  In progress.   Nutritional Diagnosis:  NB-1.1 Food and nutrition-related knowledge deficit As related to balance of carbohydrates, protein, and  fat .  As evidenced by patient report and diet hx.    Intervention:  Nutrition counseling and diabetes education initiated. Discussed Carb Counting by food group as method of portion control, reading food labels, and benefits of increased activity. Also discussed basic physiology of Diabetes, target BG ranges pre and post meals, and A1c.   Plan:  Aim for 2-3 Carb Choices per meal (30 grams) +/- 1 either way  Aim for 0-1 Carbs per snack if hungry  Include protein in moderation with your meals and snacks Consider reading food labels for Total Carbohydrate and Fat Grams of foods Consider  increasing your activity level by walking or going to the [facility] for 30 minutes daily as tolerated Rethink your drinks  Teaching Method Utilized:  Visual Auditory Hands on  Handouts given during visit include: Living Well with Diabetes Carb Counting and Food Label handouts Meal Plan Card Label reading My plate placemat  Barriers to learning/adherence to lifestyle change: none  Demonstrated degree of understanding via:  Teach Back  Monitoring/Evaluation:  Dietary intake, exercise, label reading, and body weight in 1 month(s).

## 2014-03-30 ENCOUNTER — Ambulatory Visit: Payer: Self-pay | Admitting: Dietician

## 2014-04-11 ENCOUNTER — Telehealth: Payer: Self-pay | Admitting: Internal Medicine

## 2014-04-11 NOTE — Telephone Encounter (Signed)
Pt needs order fax to [facility] for bone density test. Pt has an appt on wed 04-18-14. Pt is aware md out of office this week

## 2014-04-12 NOTE — Telephone Encounter (Signed)
Received order form from [facility].  Sent back by e-scribe.

## 2014-04-24 ENCOUNTER — Other Ambulatory Visit: Payer: Self-pay | Admitting: Internal Medicine

## 2014-04-25 LAB — HM MAMMOGRAPHY

## 2014-04-25 NOTE — Telephone Encounter (Signed)
Sent to the pharmacy by e-scribe. 

## 2014-04-27 ENCOUNTER — Encounter: Payer: Self-pay | Admitting: Family Medicine

## 2014-04-30 ENCOUNTER — Telehealth: Payer: Self-pay | Admitting: Internal Medicine

## 2014-04-30 NOTE — Telephone Encounter (Signed)
Misty help finding the bone density Also letter information on  Your desk  Thanks .

## 2014-04-30 NOTE — Telephone Encounter (Signed)
Pt would like result of bone density test also pt would like to know the statue of jury duty summary that was fax

## 2014-05-01 ENCOUNTER — Other Ambulatory Visit: Payer: Self-pay | Admitting: Internal Medicine

## 2014-05-01 NOTE — Telephone Encounter (Signed)
Sent to the pharmacy by e-scribe. 

## 2014-05-02 ENCOUNTER — Encounter: Payer: Self-pay | Admitting: Family Medicine

## 2014-05-02 NOTE — Telephone Encounter (Signed)
Received bone density results.  Placed in file for [facility] to review.  Pt notified that the letter for jury duty is available for pick up at the front desk.

## 2014-05-13 ENCOUNTER — Other Ambulatory Visit: Payer: Self-pay | Admitting: Internal Medicine

## 2014-07-27 ENCOUNTER — Ambulatory Visit: Payer: Self-pay | Admitting: Internal Medicine

## 2014-08-01 ENCOUNTER — Ambulatory Visit: Payer: BLUE CROSS/BLUE SHIELD | Admitting: Internal Medicine

## 2014-08-17 ENCOUNTER — Ambulatory Visit (INDEPENDENT_AMBULATORY_CARE_PROVIDER_SITE_OTHER): Payer: BLUE CROSS/BLUE SHIELD | Admitting: Internal Medicine

## 2014-08-17 ENCOUNTER — Encounter: Payer: Self-pay | Admitting: Internal Medicine

## 2014-08-17 VITALS — BP 138/80 | Temp 98.0°F | Wt 167.5 lb

## 2014-08-17 DIAGNOSIS — I10 Essential (primary) hypertension: Secondary | ICD-10-CM | POA: Insufficient documentation

## 2014-08-17 DIAGNOSIS — Z79899 Other long term (current) drug therapy: Secondary | ICD-10-CM

## 2014-08-17 DIAGNOSIS — E039 Hypothyroidism, unspecified: Secondary | ICD-10-CM

## 2014-08-17 DIAGNOSIS — G2581 Restless legs syndrome: Secondary | ICD-10-CM | POA: Diagnosis not present

## 2014-08-17 DIAGNOSIS — R7301 Impaired fasting glucose: Secondary | ICD-10-CM

## 2014-08-17 DIAGNOSIS — R238 Other skin changes: Secondary | ICD-10-CM

## 2014-08-17 NOTE — Progress Notes (Signed)
Pre visit review using our clinic review tool, if applicable. No additional management support is needed unless otherwise documented below in the visit note.  Chief Complaint  Patient presents with  . Follow-up    HPI: [name] 64 y.o.  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 She is generally doing well but has less time for exercise because of childcare at this time  Was doing the prep program and helped  with weight tolerability and how she felt until her childcare responsibilities.  States her blood pressures usually good 1:30 when she checked it last about 3 weeks ago. Is only taking one of the valsartan a day and not to.  Sleeping better 6-7 hours.  Has some cold feeling nearly into the day on the left foot the leg that she had surgeries and injuries with. Says that sometimes her toes look blue. Nowhere else on her body. Think she may have had an artery test evaluation a few years ago.   Some bruising chest breast leg no bleeding nose bleeds  ROS: See pertinent positives and negatives per HPI. No current chest pain or new shortness of breath.  Past Medical History  Diagnosis Date  . IBS (irritable bowel syndrome)   . Thyroid goiter     I 131 rx and then  total throidectomy 2005 [facility]  . GERD (gastroesophageal reflux disease)   . Dyspnea     nl PFTs, and echo  . Recurrent oral ulcers   . Heart murmur   . Asthma   . Headache(784.0)   . History of kidney stones   . Nephrolithiasis   . Acute pyelonephritis 05/02/2012  . Pre-diabetes 12/15    Family History  Problem Relation Age of Onset  . Cancer Mother     bladder  . COPD Sister   . Coronary artery disease Sister     age 22 also copd  . Arthritis Other   . Cancer Other     breast  . Diabetes Other   . Hyperlipidemia Other   . Hypertension Other   . Stroke Other   . Heart disease Other   . Coronary  artery disease Father     also had AAA  . Aneurysm Father     Aortic and thoracic fatal    History   Social History  . Marital Status: Married    Spouse Name: N/A  . Number of Children: N/A  . Years of Education: N/A   Social History Main Topics  . Smoking status: Never Smoker   . Smokeless tobacco: Never Used  . Alcohol Use: Yes     Comment: seldom  . Drug Use: No  . Sexual Activity: Not on file   Other Topics Concern  . None   Social History Narrative   Married   Has grandchildren   Never smoked    G4P4   hh of 2  care of GC ages 29 - 30 month  5 days a week    Outpatient Prescriptions Prior to Visit  Medication Sig Dispense Refill  . acetaminophen (TYLENOL) 500 MG tablet Take 1,000 mg by mouth every 6 (six) hours as needed for moderate pain.     . pantoprazole (PROTONIX) 40 MG tablet TAKE 1 TABLET (40 MG TOTAL) BY MOUTH 2 (TWO) TIMES DAILY. 180 tablet 1  . pravastatin (PRAVACHOL) 40 MG tablet TAKE 1 TABLET (40 MG TOTAL) BY  MOUTH EVERY EVENING. 90 tablet 1  . rOPINIRole (REQUIP) 0.5 MG tablet Take 1-2 tab at 1200, 1-2 tab at 1700, and 3 tabs at bedtime. 540 tablet 3  . SYNTHROID 88 MCG tablet TAKE 1 TABLET BY MOUTH ONCE DAILY 90 tablet 2  . valsartan (DIOVAN) 80 MG tablet TAKE 1 TABLET (80 MG TOTAL) BY MOUTH EVERY MORNING. 90 tablet 1  . chlorpheniramine-HYDROcodone (TUSSIONEX PENNKINETIC ER) 10-8 MG/5ML LQCR Take 5 mLs by mouth every 12 (twelve) hours as needed for cough. (Patient not taking: Reported on 03/02/2014) 115 mL 0  . levofloxacin (LEVAQUIN) 750 MG tablet Take 1 tablet (750 mg total) by mouth daily. X 7 days (Patient not taking: Reported on 03/02/2014) 7 tablet 0  . pantoprazole (PROTONIX) 40 MG tablet Take 40 mg by mouth 2 (two) times daily.    . valsartan (DIOVAN) 80 MG tablet Take 2 tablets (160 mg total) by mouth every morning. (Patient not taking: Reported on 03/02/2014) 180 tablet 2   No facility-administered medications prior to visit.     EXAM:  BP  138/80 mmHg  Temp(Src) 98 F (36.7 C) (Oral)  Wt 167 lb 8 oz (75.978 kg)  Body mass index is 32.71 kg/(m^2).  GENERAL: vitals reviewed and listed above, alert, oriented, appears well hydrated and in no acute distress HEENT: atraumatic, conjunctiva  clear, no obvious abnormalities on inspection of external nose and earsNECK: no obvious masses on inspection palpation  LUNGS: clear to auscultation bilaterally, no wheezes, rales or rhonchi, good air movement CV: HRRR, no clubbing cyanosis or  peripheral edema nl cap refill short systolic murmur upper sternal border not radiating MS: moves all extremities without noticeable focal  abnormality pulses in feet appear normal left foot slightly more swollen than the right no acute findings same temperature PSYCH: pleasant and cooperative, no obvious depression or anxiety Lab Results  Component Value Date   WBC 10.2 01/30/2014   HGB 13.6 01/30/2014   HCT 39.7 01/30/2014   PLT 180 01/30/2014   GLUCOSE 120* 01/30/2014   CHOL 221* 01/16/2014   TRIG 251.0* 01/16/2014   HDL 41.80 01/16/2014   LDLDIRECT 131.5 01/16/2014   LDLCALC 118* 01/13/2013   ALT 47* 01/16/2014   AST 28 01/16/2014   NA 138 01/30/2014   K 3.9 01/30/2014   CL 102 01/30/2014   CREATININE 0.86 01/30/2014   BUN 13 01/30/2014   CO2 27 01/30/2014   TSH 2.05 01/16/2014   INR 0.97 10/27/2010   HGBA1C 6.3 01/16/2014   Wt Readings from Last 3 Encounters:  08/17/14 167 lb 8 oz (75.978 kg)  03/02/14 170 lb (77.111 kg)  01/30/14 165 lb (74.844 kg)   BP Readings from Last 3 Encounters:  08/17/14 138/80  01/30/14 112/63  01/23/14 130/70    ASSESSMENT AND PLAN:  Discussed the following assessment and plan:  Essential hypertension - / only taking 80 mg per day can increase if bp not controlled  - Plan: ABI  Abnormal foot color - vascular reactivity? looks ok now  hx of injury surgery to left leg.  get ABI has risk - Plan: ABI  Fasting hyperglycemia - check a1c next  labs  Hypothyroidism, unspecified hypothyroidism type  Restless legs syndrome (RLS)  Medication management Contact if bleeding bruising   -Patient advised to return or notify health care team  if symptoms worsen ,persist or new concerns arise.  Patient Instructions   Take blood pressure readings twice a day for  10 - 14  days and then  periodically .  Goal is below   140/90     . If  Not controlled we can go back up to  320 mg per day .  Will plan   Artery check .  About the left leg  Could be from previous injury .  Repeat BP today was 138 and 140. ..Marland Kitchen Plan cpx and labs and a1c   in 6 months     Lab Results  Component Value Date   WBC 10.2 01/30/2014   HGB 13.6 01/30/2014   HCT 39.7 01/30/2014   PLT 180 01/30/2014   GLUCOSE 120* 01/30/2014   CHOL 221* 01/16/2014   TRIG 251.0* 01/16/2014   HDL 41.80 01/16/2014   LDLDIRECT 131.5 01/16/2014   LDLCALC 118* 01/13/2013   ALT 47* 01/16/2014   AST 28 01/16/2014   NA 138 01/30/2014   K 3.9 01/30/2014   CL 102 01/30/2014   CREATININE 0.86 01/30/2014   BUN 13 01/30/2014   CO2 27 01/30/2014   TSH 2.05 01/16/2014   INR 0.97 10/27/2010   HGBA1C 6.3 01/16/2014         [name] K. Neria Procter M.D.

## 2014-08-17 NOTE — Patient Instructions (Signed)
Take blood pressure readings twice a day for  10 - 14  days and then periodically .  Goal is below   140/90     . If  Not controlled we can go back up to  320 mg per day .  Will plan   Artery check .  About the left leg  Could be from previous injury .  Repeat BP today was 138 and 140. ..Marland Kitchen Plan cpx and labs and a1c   in 6 months     Lab Results  Component Value Date   WBC 10.2 01/30/2014   HGB 13.6 01/30/2014   HCT 39.7 01/30/2014   PLT 180 01/30/2014   GLUCOSE 120* 01/30/2014   CHOL 221* 01/16/2014   TRIG 251.0* 01/16/2014   HDL 41.80 01/16/2014   LDLDIRECT 131.5 01/16/2014   LDLCALC 118* 01/13/2013   ALT 47* 01/16/2014   AST 28 01/16/2014   NA 138 01/30/2014   K 3.9 01/30/2014   CL 102 01/30/2014   CREATININE 0.86 01/30/2014   BUN 13 01/30/2014   CO2 27 01/30/2014   TSH 2.05 01/16/2014   INR 0.97 10/27/2010   HGBA1C 6.3 01/16/2014

## 2014-08-20 ENCOUNTER — Encounter: Payer: Self-pay | Admitting: Internal Medicine

## 2014-08-22 ENCOUNTER — Other Ambulatory Visit: Payer: Self-pay | Admitting: Family Medicine

## 2014-08-22 DIAGNOSIS — R238 Other skin changes: Secondary | ICD-10-CM

## 2014-09-03 ENCOUNTER — Ambulatory Visit (HOSPITAL_COMMUNITY)
Admission: RE | Admit: 2014-09-03 | Discharge: 2014-09-03 | Disposition: A | Discharge status: Home / Self Care | Payer: BLUE CROSS/BLUE SHIELD | Source: Ambulatory Visit | Attending: Cardiovascular Disease | Admitting: Cardiovascular Disease

## 2014-09-03 DIAGNOSIS — R238 Other skin changes: Secondary | ICD-10-CM | POA: Diagnosis not present

## 2014-10-05 ENCOUNTER — Other Ambulatory Visit: Payer: Self-pay | Admitting: Internal Medicine

## 2014-10-05 NOTE — Telephone Encounter (Signed)
Sent to the pharmacy by e-scribe.  Upcoming appt on 01/25/15

## 2014-11-21 ENCOUNTER — Other Ambulatory Visit: Payer: Self-pay | Admitting: Internal Medicine

## 2014-11-22 NOTE — Telephone Encounter (Signed)
Refill x 6 months 

## 2014-12-09 ENCOUNTER — Other Ambulatory Visit: Payer: Self-pay | Admitting: Internal Medicine

## 2014-12-11 NOTE — Telephone Encounter (Signed)
Sent to the pharmacy by e-scribe. 

## 2015-01-07 ENCOUNTER — Ambulatory Visit: Payer: Self-pay | Admitting: Internal Medicine

## 2015-01-09 ENCOUNTER — Ambulatory Visit: Payer: BLUE CROSS/BLUE SHIELD | Admitting: Adult Health

## 2015-01-18 ENCOUNTER — Other Ambulatory Visit (INDEPENDENT_AMBULATORY_CARE_PROVIDER_SITE_OTHER): Payer: BLUE CROSS/BLUE SHIELD

## 2015-01-18 DIAGNOSIS — Z0000 Encounter for general adult medical examination without abnormal findings: Secondary | ICD-10-CM | POA: Diagnosis not present

## 2015-01-18 LAB — CBC WITH DIFFERENTIAL/PLATELET
BASOS PCT: 0.8 % (ref 0.0–3.0)
Basophils Absolute: 0 10*3/uL (ref 0.0–0.1)
EOS PCT: 1.7 % (ref 0.0–5.0)
Eosinophils Absolute: 0.1 10*3/uL (ref 0.0–0.7)
HEMATOCRIT: 42.4 % (ref 36.0–46.0)
HEMOGLOBIN: 14.2 g/dL (ref 12.0–15.0)
LYMPHS PCT: 36.8 % (ref 12.0–46.0)
Lymphs Abs: 1.6 10*3/uL (ref 0.7–4.0)
MCHC: 33.4 g/dL (ref 30.0–36.0)
MCV: 96.8 fl (ref 78.0–100.0)
MONOS PCT: 7.6 % (ref 3.0–12.0)
Monocytes Absolute: 0.3 10*3/uL (ref 0.1–1.0)
Neutro Abs: 2.3 10*3/uL (ref 1.4–7.7)
Neutrophils Relative %: 53.1 % (ref 43.0–77.0)
PLATELETS: 258 10*3/uL (ref 150.0–400.0)
RBC: 4.38 Mil/uL (ref 3.87–5.11)
RDW: 13.2 % (ref 11.5–15.5)
WBC: 4.4 10*3/uL (ref 4.0–10.5)

## 2015-01-18 LAB — BASIC METABOLIC PANEL
BUN: 18 mg/dL (ref 6–23)
CHLORIDE: 103 meq/L (ref 96–112)
CO2: 29 mEq/L (ref 19–32)
Calcium: 9.7 mg/dL (ref 8.4–10.5)
Creatinine, Ser: 0.71 mg/dL (ref 0.40–1.20)
GFR: 87.95 mL/min (ref >60.00)
Glucose, Bld: 108 mg/dL — ABNORMAL HIGH (ref 70–99)
POTASSIUM: 4.5 meq/L (ref 3.5–5.1)
SODIUM: 141 meq/L (ref 135–145)

## 2015-01-18 LAB — LIPID PANEL
Cholesterol: 234 mg/dL — ABNORMAL HIGH (ref 0–200)
HDL: 51.8 mg/dL (ref >39.00)
LDL Cholesterol: 147 mg/dL — ABNORMAL HIGH (ref 0–99)
NONHDL: 182.69
TRIGLYCERIDES: 178 mg/dL — AB (ref 0.0–149.0)
Total CHOL/HDL Ratio: 5
VLDL: 35.6 mg/dL (ref 0.0–40.0)

## 2015-01-18 LAB — HEPATIC FUNCTION PANEL
ALBUMIN: 4.2 g/dL (ref 3.5–5.2)
ALK PHOS: 50 U/L (ref 39–117)
ALT: 37 U/L — ABNORMAL HIGH (ref 0–35)
AST: 22 U/L (ref 0–37)
Bilirubin, Direct: 0.1 mg/dL (ref 0.0–0.3)
TOTAL PROTEIN: 6.9 g/dL (ref 6.0–8.3)
Total Bilirubin: 1.1 mg/dL (ref 0.2–1.2)

## 2015-01-18 LAB — HEMOGLOBIN A1C: Hgb A1c MFr Bld: 6.1 % (ref 4.6–6.5)

## 2015-01-18 LAB — TSH: TSH: 1.82 u[IU]/mL (ref 0.35–4.50)

## 2015-01-25 ENCOUNTER — Ambulatory Visit (INDEPENDENT_AMBULATORY_CARE_PROVIDER_SITE_OTHER): Payer: BLUE CROSS/BLUE SHIELD | Admitting: Internal Medicine

## 2015-01-25 VITALS — BP 130/78 | Temp 97.8°F | Ht 60.0 in | Wt 172.0 lb

## 2015-01-25 DIAGNOSIS — R7989 Other specified abnormal findings of blood chemistry: Secondary | ICD-10-CM | POA: Diagnosis not present

## 2015-01-25 DIAGNOSIS — E785 Hyperlipidemia, unspecified: Secondary | ICD-10-CM | POA: Diagnosis not present

## 2015-01-25 DIAGNOSIS — Z0000 Encounter for general adult medical examination without abnormal findings: Secondary | ICD-10-CM | POA: Diagnosis not present

## 2015-01-25 DIAGNOSIS — R05 Cough: Secondary | ICD-10-CM | POA: Diagnosis not present

## 2015-01-25 DIAGNOSIS — E039 Hypothyroidism, unspecified: Secondary | ICD-10-CM | POA: Diagnosis not present

## 2015-01-25 DIAGNOSIS — K219 Gastro-esophageal reflux disease without esophagitis: Secondary | ICD-10-CM

## 2015-01-25 DIAGNOSIS — R945 Abnormal results of liver function studies: Secondary | ICD-10-CM

## 2015-01-25 DIAGNOSIS — R7301 Impaired fasting glucose: Secondary | ICD-10-CM

## 2015-01-25 DIAGNOSIS — G2581 Restless legs syndrome: Secondary | ICD-10-CM

## 2015-01-25 DIAGNOSIS — Z79899 Other long term (current) drug therapy: Secondary | ICD-10-CM

## 2015-01-25 DIAGNOSIS — R053 Chronic cough: Secondary | ICD-10-CM

## 2015-01-25 MED ORDER — AMOXICILLIN-POT CLAVULANATE 875-125 MG PO TABS: 1.0000 | ORAL_TABLET | Freq: Two times a day (BID) | ORAL | Status: DC

## 2015-01-25 NOTE — Patient Instructions (Signed)
Could could be fromn sinus infection not resolving. Reflux can also prolong cough from a respiratory infection. Avoid cough drops  Stay on the protonix   Can increase to twice a day for 2 weeks  Ant then back down  To once a day Add antibiotic   Eventually like to dec use of the acid blockers to avoid  decrease absporption  Calcium and magnesium for bone health  Will need to repeat liver tests in next month or so  And then plan fu could be from  Medication such as tylenol aleve     Alcohol if used in excess.   Fatty liver      Health Maintenance, Female Adopting a healthy lifestyle and getting preventive care can go a long way to promote health and wellness. Talk with your health care provider about what schedule of regular examinations is right for you. This is a good chance for you to check in with your provider about disease prevention and staying healthy. In between checkups, there are plenty of things you can do on your own. Experts have done a lot of research about which lifestyle changes and preventive measures are most likely to keep you healthy. Ask your health care provider for more information. WEIGHT AND DIET  Eat a healthy diet  Be sure to include plenty of vegetables, fruits, low-fat dairy products, and lean protein.  Do not eat a lot of foods high in solid fats, added sugars, or salt.  Get regular exercise. This is one of the most important things you can do for your health.  Most adults should exercise for at least 150 minutes each week. The exercise should increase your heart rate and make you sweat (moderate-intensity exercise).  Most adults should also do strengthening exercises at least twice a week. This is in addition to the moderate-intensity exercise.  Maintain a healthy weight  Body mass index (BMI) is a measurement that can be used to identify possible weight problems. It estimates body fat based on height and weight. Your health care provider can help determine  your BMI and help you achieve or maintain a healthy weight.  For females 61 years of age and older:   A BMI below 18.5 is considered underweight.  A BMI of 18.5 to 24.9 is normal.  A BMI of 25 to 29.9 is considered overweight.  A BMI of 30 and above is considered obese.  Watch levels of cholesterol and blood lipids  You should start having your blood tested for lipids and cholesterol at 64 years of age, then have this test every 5 years.  You may need to have your cholesterol levels checked more often if:  Your lipid or cholesterol levels are high.  You are older than 64 years of age.  You are at high risk for heart disease.  CANCER SCREENING   Lung Cancer  Lung cancer screening is recommended for adults 42-44 years old who are at high risk for lung cancer because of a history of smoking.  A yearly low-dose CT scan of the lungs is recommended for people who:  Currently smoke.  Have quit within the past 15 years.  Have at least a 30-pack-year history of smoking. A pack year is smoking an average of one pack of cigarettes a day for 1 year.  Yearly screening should continue until it has been 15 years since you quit.  Yearly screening should stop if you develop a health problem that would prevent you from having lung  cancer treatment.  Breast Cancer  Practice breast self-awareness. This means understanding how your breasts normally appear and feel.  It also means doing regular breast self-exams. Let your health care provider know about any changes, no matter how small.  If you are in your 20s or 30s, you should have a clinical breast exam (CBE) by a health care provider every 1-3 years as part of a regular health exam.  If you are 60 or older, have a CBE every year. Also consider having a breast X-ray (mammogram) every year.  If you have a family history of breast cancer, talk to your health care provider about genetic screening.  If you are at high risk for breast  cancer, talk to your health care provider about having an MRI and a mammogram every year.  Breast cancer gene (BRCA) assessment is recommended for women who have family members with BRCA-related cancers. BRCA-related cancers include:  Breast.  Ovarian.  Tubal.  Peritoneal cancers.  Results of the assessment will determine the need for genetic counseling and BRCA1 and BRCA2 testing. Cervical Cancer Your health care provider may recommend that you be screened regularly for cancer of the pelvic organs (ovaries, uterus, and vagina). This screening involves a pelvic examination, including checking for microscopic changes to the surface of your cervix (Pap test). You may be encouraged to have this screening done every 3 years, beginning at age 33.  For women ages 79-65, health care providers may recommend pelvic exams and Pap testing every 3 years, or they may recommend the Pap and pelvic exam, combined with testing for human papilloma virus (HPV), every 5 years. Some types of HPV increase your risk of cervical cancer. Testing for HPV may also be done on women of any age with unclear Pap test results.  Other health care providers may not recommend any screening for nonpregnant women who are considered low risk for pelvic cancer and who do not have symptoms. Ask your health care provider if a screening pelvic exam is right for you.  If you have had past treatment for cervical cancer or a condition that could lead to cancer, you need Pap tests and screening for cancer for at least 20 years after your treatment. If Pap tests have been discontinued, your risk factors (such as having a new sexual partner) need to be reassessed to determine if screening should resume. Some women have medical problems that increase the chance of getting cervical cancer. In these cases, your health care provider may recommend more frequent screening and Pap tests. Colorectal Cancer  This type of cancer can be detected and  often prevented.  Routine colorectal cancer screening usually begins at 64 years of age and continues through 64 years of age.  Your health care provider may recommend screening at an earlier age if you have risk factors for colon cancer.  Your health care provider may also recommend using home test kits to check for hidden blood in the stool.  A small camera at the end of a tube can be used to examine your colon directly (sigmoidoscopy or colonoscopy). This is done to check for the earliest forms of colorectal cancer.  Routine screening usually begins at age 34.  Direct examination of the colon should be repeated every 5-10 years through 64 years of age. However, you may need to be screened more often if early forms of precancerous polyps or small growths are found. Skin Cancer  Check your skin from head to toe regularly.  Tell  your health care provider about any new moles or changes in moles, especially if there is a change in a mole's shape or color.  Also tell your health care provider if you have a mole that is larger than the size of a pencil eraser.  Always use sunscreen. Apply sunscreen liberally and repeatedly throughout the day.  Protect yourself by wearing long sleeves, pants, a wide-brimmed hat, and sunglasses whenever you are outside. HEART DISEASE, DIABETES, AND HIGH BLOOD PRESSURE   High blood pressure causes heart disease and increases the risk of stroke. High blood pressure is more likely to develop in:  People who have blood pressure in the high end of the normal range (130-139/85-89 mm Hg).  People who are overweight or obese.  People who are African American.  If you are 28-34 years of age, have your blood pressure checked every 3-5 years. If you are 46 years of age or older, have your blood pressure checked every year. You should have your blood pressure measured twice--once when you are at a hospital or clinic, and once when you are not at a hospital or clinic.  Record the average of the two measurements. To check your blood pressure when you are not at a hospital or clinic, you can use:  An automated blood pressure machine at a pharmacy.  A home blood pressure monitor.  If you are between 31 years and 42 years old, ask your health care provider if you should take aspirin to prevent strokes.  Have regular diabetes screenings. This involves taking a blood sample to check your fasting blood sugar level.  If you are at a normal weight and have a low risk for diabetes, have this test once every three years after 64 years of age.  If you are overweight and have a high risk for diabetes, consider being tested at a younger age or more often. PREVENTING INFECTION  Hepatitis B  If you have a higher risk for hepatitis B, you should be screened for this virus. You are considered at high risk for hepatitis B if:  You were born in a country where hepatitis B is common. Ask your health care provider which countries are considered high risk.  Your parents were born in a high-risk country, and you have not been immunized against hepatitis B (hepatitis B vaccine).  You have HIV or AIDS.  You use needles to inject street drugs.  You live with someone who has hepatitis B.  You have had sex with someone who has hepatitis B.  You get hemodialysis treatment.  You take certain medicines for conditions, including cancer, organ transplantation, and autoimmune conditions. Hepatitis C  Blood testing is recommended for:  Everyone born from [birth year] through 1965.  Anyone with known risk factors for hepatitis C. Sexually transmitted infections (STIs)  You should be screened for sexually transmitted infections (STIs) including gonorrhea and chlamydia if:  You are sexually active and are younger than 64 years of age.  You are older than 64 years of age and your health care provider tells you that you are at risk for this type of infection.  Your sexual activity  has changed since you were last screened and you are at an increased risk for chlamydia or gonorrhea. Ask your health care provider if you are at risk.  If you do not have HIV, but are at risk, it may be recommended that you take a prescription medicine daily to prevent HIV infection. This is called pre-exposure prophylaxis (  PrEP). You are considered at risk if:  You are sexually active and do not regularly use condoms or know the HIV status of your partner(s).  You take drugs by injection.  You are sexually active with a partner who has HIV. Talk with your health care provider about whether you are at high risk of being infected with HIV. If you choose to begin PrEP, you should first be tested for HIV. You should then be tested every 3 months for as long as you are taking PrEP.  PREGNANCY   If you are premenopausal and you may become pregnant, ask your health care provider about preconception counseling.  If you may become pregnant, take 400 to 800 micrograms (mcg) of folic acid every day.  If you want to prevent pregnancy, talk to your health care provider about birth control (contraception). OSTEOPOROSIS AND MENOPAUSE   Osteoporosis is a disease in which the bones lose minerals and strength with aging. This can result in serious bone fractures. Your risk for osteoporosis can be identified using a bone density scan.  If you are 71 years of age or older, or if you are at risk for osteoporosis and fractures, ask your health care provider if you should be screened.  Ask your health care provider whether you should take a calcium or vitamin D supplement to lower your risk for osteoporosis.  Menopause may have certain physical symptoms and risks.  Hormone replacement therapy may reduce some of these symptoms and risks. Talk to your health care provider about whether hormone replacement therapy is right for you.  HOME CARE INSTRUCTIONS   Schedule regular health, dental, and eye  exams.  Stay current with your immunizations.   Do not use any tobacco products including cigarettes, chewing tobacco, or electronic cigarettes.  If you are pregnant, do not drink alcohol.  If you are breastfeeding, limit how much and how often you drink alcohol.  Limit alcohol intake to no more than 1 drink per day for nonpregnant women. One drink equals 12 ounces of beer, 5 ounces of wine, or 1 ounces of hard liquor.  Do not use street drugs.  Do not share needles.  Ask your health care provider for help if you need support or information about quitting drugs.  Tell your health care provider if you often feel depressed.  Tell your health care provider if you have ever been abused or do not feel safe at home.   This information is not intended to replace advice given to you by your health care provider. Make sure you discuss any questions you have with your health care provider.   Document Released: 07/28/2010 Document Revised: 02/02/2014 Document Reviewed: 12/14/2012 [employer] Interactive Patient Education [employer].

## 2015-01-25 NOTE — Progress Notes (Signed)
Chief Complaint  Patient presents with  . Annual Exam  . Cough    since thanksgiving    HPI: Patient  [name]  64 y.o. comes in today for Preventive Health Care visit  And med disease management  Has had cough from t giving lie a cld that not resolved no fever hemoptysis   Hx ge reflex upper airway cough syndrome but this is different that that. No cp new sob has some  Was tld to take bid ppi( pulmonary)  but only taking one currently  doe with eval in past.  Bp controlled   rkls no change meds    Health Maintenance  Topic Date Due  . Hepatitis C Screening  1950-10-04  . HIV Screening  07/19/1965  . INFLUENZA VACCINE  01/25/2016 (Originally 08/27/2014)  . MAMMOGRAM  04/24/2016  . COLONOSCOPY  08/30/2022  . TETANUS/TDAP  01/25/2023  . ZOSTAVAX  Completed   Health Maintenance Review LIFESTYLE:  Exercise:   Active grand children    Some sob  y program  Tobacco/ETS:no Alcohol:n Sugar beverages:  gatorade water.  Sleep:  Good    Drug use: no  ROS:  GEN/ HEENT: No fever, significant weight changes sweats headaches vision problems hearing changes, CV/ PULM; No chest pain s syncope,edema  change in exercise tolerance. GI /GU: No adominal pain, vomiting, change in bowel habits. No blood in the stool. No significant GU symptoms. SKIN/HEME: ,no acute skin rashes suspicious lesions or bleeding. No lymphadenopathy, nodules, masses.  NEURO/ PSYCH:  No neurologic signs such as weakness numbness. No depression anxiety. IMM/ Allergy: No unusual infections.  Allergy .   REST of 12 system review negative except as per HPI   Past Medical History  Diagnosis Date  . IBS (irritable bowel syndrome)   . Thyroid goiter     I 131 rx and then  total throidectomy 2005 [facility]  . GERD (gastroesophageal reflux disease)   . Dyspnea     nl PFTs, and echo  . Recurrent oral ulcers   . Heart murmur   . Asthma   . Headache(784.0)   . History of kidney stones   . Nephrolithiasis     . Acute pyelonephritis 05/02/2012  . Pre-diabetes 12/15    Past Surgical History  Procedure Laterality Date  . Total thyroidectomy  2005    for  large nodule 2006  . Abdominal hysterectomy  1994    fibroid  . Appendectomy  1968  . Tonsillectomy  1979  . Orif left tivial plateau fracture      Dr. Collier [place] 2/08  . Plates and pins take out of left knee  06/2008    Family History  Problem Relation Age of Onset  . Cancer Mother     bladder  . COPD Sister   . Coronary artery disease Sister     age 1 also copd  . Arthritis Other   . Cancer Other     breast  . Diabetes Other   . Hyperlipidemia Other   . Hypertension Other   . Stroke Other   . Heart disease Other   . Coronary artery disease Father     also had AAA  . Aneurysm Father     Aortic and thoracic fatal    Social History   Social History  . Marital Status: Married    Spouse Name: N/A  . Number of Children: N/A  . Years of Education: N/A   Social History Main Topics  . Smoking  status: Never Smoker   . Smokeless tobacco: Never Used  . Alcohol Use: Yes     Comment: seldom  . Drug Use: No  . Sexual Activity: Not on file   Other Topics Concern  . Not on file   Social History Narrative   Married   Has grandchildren   Never smoked    G4P4   hh of 2  care of GC ages 49 - 6 month  5 days a week    Outpatient Prescriptions Prior to Visit  Medication Sig Dispense Refill  . acetaminophen (TYLENOL) 500 MG tablet Take 1,000 mg by mouth every 6 (six) hours as needed for moderate pain.     . pantoprazole (PROTONIX) 40 MG tablet TAKE 1 TABLET (40 MG TOTAL) BY MOUTH 2 (TWO) TIMES DAILY. 180 tablet 0  . pravastatin (PRAVACHOL) 40 MG tablet TAKE 1 TABLET (40 MG TOTAL) BY MOUTH EVERY EVENING. 90 tablet 0  . rOPINIRole (REQUIP) 0.5 MG tablet TAKE 1 TO 2 TABLETS BY MOUTH AT NOON, AND 5PM AND 3 TABLETS AT BEDTIME 540 tablet 1  . SYNTHROID 88 MCG tablet TAKE 1 TABLET BY MOUTH ONCE DAILY 90 tablet 0  . valsartan  (DIOVAN) 80 MG tablet TAKE 1 TABLET (80 MG TOTAL) BY MOUTH EVERY MORNING. 90 tablet 0   No facility-administered medications prior to visit.     EXAM:  BP 130/78 mmHg  Temp(Src) 97.8 F (36.6 C) (Oral)  Ht 5' (1.524 m)  Wt 172 lb (78.019 kg)  BMI 33.59 kg/m2  Body mass index is 33.59 kg/(m^2).  Physical Exam: Vital signs reviewed HAL:PFXT is a well-developed well-nourished alert cooperative    who appearsr stated age in no acute distress.  Mil d upper congestion and ocass cough  HEENT: normocephalic atraumatic , Eyes: PERRL EOM's full, conjunctiva clear, Nares: paten,t no deformity mucoid discharge  no tenderness., Ears: no deformity EAC's clear TMs with normal landmarks. Mouth: clear OP, no lesions, edema. pnd noted  Moist mucous membranes. Dentition in adequate repair. NECK: supple without masses, thyromegaly or bruits. CHEST/PULM:  Clear to auscultation and percussion breath sounds equal no wheeze , rales or rhonchi. No chest wall deformities or tenderness. CV: PMI is nondisplaced, S1 S2 no gallops, murmurs, rubs. Peripheral pulses are full without delay.No JVD . Breast: normal by inspection . No dimpling, discharge, masses, tenderness or discharge . ABDOMEN: Bowel sounds normal nontender  No guard or rebound, no hepato splenomegal ?no CVA tenderness.  Extremtities:  No clubbing cyanosis or edema, no acute joint swelling or redness no focal atrophy NEURO:  Oriented x3, cranial nerves 3-12 appear to be intact, no obvious focal weakness,gait within normal limits no abnormal reflexes or asymmetrical SKIN: No acute rashes normal turgor, color, no bruising or petechiae. PSYCH: Oriented, good eye contact, no obvious depression anxiety, cognition and judgment appear normal. LN: no cervical axillary inguinal adenopathy  Lab Results  Component Value Date   WBC 4.4 01/18/2015   HGB 14.2 01/18/2015   HCT 42.4 01/18/2015   PLT 258.0 01/18/2015   GLUCOSE 108* 01/18/2015   CHOL 234*  01/18/2015   TRIG 178.0* 01/18/2015   HDL 51.80 01/18/2015   LDLDIRECT 131.5 01/16/2014   LDLCALC 147* 01/18/2015   ALT 37* 01/18/2015   AST 22 01/18/2015   NA 141 01/18/2015   K 4.5 01/18/2015   CL 103 01/18/2015   CREATININE 0.71 01/18/2015   BUN 18 01/18/2015   CO2 29 01/18/2015   TSH 1.82 01/18/2015   INR  0.97 10/27/2010   HGBA1C 6.1 01/18/2015   BP Readings from Last 3 Encounters:  01/25/15 130/78  08/17/14 138/80  01/30/14 112/63    ASSESSMENT AND PLAN:  Discussed the following assessment and plan:  Visit for preventive health examination  Cough, persistent - rx for sinusitis bid ppi short term and fu about 1 monthcxray 1 16 bronchial changes   Abnormal LFTs - minor poss fatty liver ches serology etc and fu consider [place]  - Plan: Hepatic function panel, Hepatitis B surface antigen, Hepatitis B surface antibody, Hepatitis C antibody, ANA, Ferritin  Hypothyroidism, unspecified hypothyroidism type - in range   Hyperlipidemia  Medication management - on chronic ppi cont for now  until cough  risk beneot discussed   Gastroesophageal reflux disease, esophagitis presence not specified  Restless legs syndrome (RLS) - Plan: Hepatic function panel, Hepatitis B surface antigen, Hepatitis B surface antibody, Hepatitis C antibody, ANA, Ferritin  Fasting hyperglycemia  Patient Care Team: Burnis Medin, MD as PCP - General Sallyanne Havers, MD as Referring Physician (Orthopedic Surgery) Lowella Bandy, MD as Attending Physician (Urology) Josue Hector, MD as Consulting Physician (Cardiology) Patient Instructions  Could could be fromn sinus infection not resolving. Reflux can also prolong cough from a respiratory infection. Avoid cough drops  Stay on the protonix   Can increase to twice a day for 2 weeks  Ant then back down  To once a day Add antibiotic   Eventually like to dec use of the acid blockers to avoid  decrease absporption  Calcium and magnesium for bone health  Will  need to repeat liver tests in next month or so  And then plan fu could be from  Medication such as tylenol aleve     Alcohol if used in excess.   Fatty liver      Health Maintenance, Female Adopting a healthy lifestyle and getting preventive care can go a long way to promote health and wellness. Talk with your health care provider about what schedule of regular examinations is right for you. This is a good chance for you to check in with your provider about disease prevention and staying healthy. In between checkups, there are plenty of things you can do on your own. Experts have done a lot of research about which lifestyle changes and preventive measures are most likely to keep you healthy. Ask your health care provider for more information. WEIGHT AND DIET  Eat a healthy diet  Be sure to include plenty of vegetables, fruits, low-fat dairy products, and lean protein.  Do not eat a lot of foods high in solid fats, added sugars, or salt.  Get regular exercise. This is one of the most important things you can do for your health.  Most adults should exercise for at least 150 minutes each week. The exercise should increase your heart rate and make you sweat (moderate-intensity exercise).  Most adults should also do strengthening exercises at least twice a week. This is in addition to the moderate-intensity exercise.  Maintain a healthy weight  Body mass index (BMI) is a measurement that can be used to identify possible weight problems. It estimates body fat based on height and weight. Your health care provider can help determine your BMI and help you achieve or maintain a healthy weight.  For females 20 years of age and older:   A BMI below 18.5 is considered underweight.  A BMI of 18.5 to 24.9 is normal.  A BMI of 25 to 29.9  is considered overweight.  A BMI of 30 and above is considered obese.  Watch levels of cholesterol and blood lipids  You should start having your blood tested  for lipids and cholesterol at 64 years of age, then have this test every 5 years.  You may need to have your cholesterol levels checked more often if:  Your lipid or cholesterol levels are high.  You are older than 64 years of age.  You are at high risk for heart disease.  CANCER SCREENING   Lung Cancer  Lung cancer screening is recommended for adults 82-28 years old who are at high risk for lung cancer because of a history of smoking.  A yearly low-dose CT scan of the lungs is recommended for people who:  Currently smoke.  Have quit within the past 15 years.  Have at least a 30-pack-year history of smoking. A pack year is smoking an average of one pack of cigarettes a day for 1 year.  Yearly screening should continue until it has been 15 years since you quit.  Yearly screening should stop if you develop a health problem that would prevent you from having lung cancer treatment.  Breast Cancer  Practice breast self-awareness. This means understanding how your breasts normally appear and feel.  It also means doing regular breast self-exams. Let your health care provider know about any changes, no matter how small.  If you are in your 20s or 30s, you should have a clinical breast exam (CBE) by a health care provider every 1-3 years as part of a regular health exam.  If you are 55 or older, have a CBE every year. Also consider having a breast X-ray (mammogram) every year.  If you have a family history of breast cancer, talk to your health care provider about genetic screening.  If you are at high risk for breast cancer, talk to your health care provider about having an MRI and a mammogram every year.  Breast cancer gene (BRCA) assessment is recommended for women who have family members with BRCA-related cancers. BRCA-related cancers include:  Breast.  Ovarian.  Tubal.  Peritoneal cancers.  Results of the assessment will determine the need for genetic counseling and  BRCA1 and BRCA2 testing. Cervical Cancer Your health care provider may recommend that you be screened regularly for cancer of the pelvic organs (ovaries, uterus, and vagina). This screening involves a pelvic examination, including checking for microscopic changes to the surface of your cervix (Pap test). You may be encouraged to have this screening done every 3 years, beginning at age 28.  For women ages 79-65, health care providers may recommend pelvic exams and Pap testing every 3 years, or they may recommend the Pap and pelvic exam, combined with testing for human papilloma virus (HPV), every 5 years. Some types of HPV increase your risk of cervical cancer. Testing for HPV may also be done on women of any age with unclear Pap test results.  Other health care providers may not recommend any screening for nonpregnant women who are considered low risk for pelvic cancer and who do not have symptoms. Ask your health care provider if a screening pelvic exam is right for you.  If you have had past treatment for cervical cancer or a condition that could lead to cancer, you need Pap tests and screening for cancer for at least 20 years after your treatment. If Pap tests have been discontinued, your risk factors (such as having a new sexual partner) need to  be reassessed to determine if screening should resume. Some women have medical problems that increase the chance of getting cervical cancer. In these cases, your health care provider may recommend more frequent screening and Pap tests. Colorectal Cancer  This type of cancer can be detected and often prevented.  Routine colorectal cancer screening usually begins at 64 years of age and continues through 64 years of age.  Your health care provider may recommend screening at an earlier age if you have risk factors for colon cancer.  Your health care provider may also recommend using home test kits to check for hidden blood in the stool.  A small camera at  the end of a tube can be used to examine your colon directly (sigmoidoscopy or colonoscopy). This is done to check for the earliest forms of colorectal cancer.  Routine screening usually begins at age 28.  Direct examination of the colon should be repeated every 5-10 years through 64 years of age. However, you may need to be screened more often if early forms of precancerous polyps or small growths are found. Skin Cancer  Check your skin from head to toe regularly.  Tell your health care provider about any new moles or changes in moles, especially if there is a change in a mole's shape or color.  Also tell your health care provider if you have a mole that is larger than the size of a pencil eraser.  Always use sunscreen. Apply sunscreen liberally and repeatedly throughout the day.  Protect yourself by wearing long sleeves, pants, a wide-brimmed hat, and sunglasses whenever you are outside. HEART DISEASE, DIABETES, AND HIGH BLOOD PRESSURE   High blood pressure causes heart disease and increases the risk of stroke. High blood pressure is more likely to develop in:  People who have blood pressure in the high end of the normal range (130-139/85-89 mm Hg).  People who are overweight or obese.  People who are African American.  If you are 56-2 years of age, have your blood pressure checked every 3-5 years. If you are 58 years of age or older, have your blood pressure checked every year. You should have your blood pressure measured twice--once when you are at a hospital or clinic, and once when you are not at a hospital or clinic. Record the average of the two measurements. To check your blood pressure when you are not at a hospital or clinic, you can use:  An automated blood pressure machine at a pharmacy.  A home blood pressure monitor.  If you are between 89 years and 38 years old, ask your health care provider if you should take aspirin to prevent strokes.  Have regular diabetes  screenings. This involves taking a blood sample to check your fasting blood sugar level.  If you are at a normal weight and have a low risk for diabetes, have this test once every three years after 64 years of age.  If you are overweight and have a high risk for diabetes, consider being tested at a younger age or more often. PREVENTING INFECTION  Hepatitis B  If you have a higher risk for hepatitis B, you should be screened for this virus. You are considered at high risk for hepatitis B if:  You were born in a country where hepatitis B is common. Ask your health care provider which countries are considered high risk.  Your parents were born in a high-risk country, and you have not been immunized against hepatitis B (hepatitis B  vaccine).  You have HIV or AIDS.  You use needles to inject street drugs.  You live with someone who has hepatitis B.  You have had sex with someone who has hepatitis B.  You get hemodialysis treatment.  You take certain medicines for conditions, including cancer, organ transplantation, and autoimmune conditions. Hepatitis C  Blood testing is recommended for:  Everyone born from [birth year] through 1965.  Anyone with known risk factors for hepatitis C. Sexually transmitted infections (STIs)  You should be screened for sexually transmitted infections (STIs) including gonorrhea and chlamydia if:  You are sexually active and are younger than 64 years of age.  You are older than 64 years of age and your health care provider tells you that you are at risk for this type of infection.  Your sexual activity has changed since you were last screened and you are at an increased risk for chlamydia or gonorrhea. Ask your health care provider if you are at risk.  If you do not have HIV, but are at risk, it may be recommended that you take a prescription medicine daily to prevent HIV infection. This is called pre-exposure prophylaxis (PrEP). You are considered at risk  if:  You are sexually active and do not regularly use condoms or know the HIV status of your partner(s).  You take drugs by injection.  You are sexually active with a partner who has HIV. Talk with your health care provider about whether you are at high risk of being infected with HIV. If you choose to begin PrEP, you should first be tested for HIV. You should then be tested every 3 months for as long as you are taking PrEP.  PREGNANCY   If you are premenopausal and you may become pregnant, ask your health care provider about preconception counseling.  If you may become pregnant, take 400 to 800 micrograms (mcg) of folic acid every day.  If you want to prevent pregnancy, talk to your health care provider about birth control (contraception). OSTEOPOROSIS AND MENOPAUSE   Osteoporosis is a disease in which the bones lose minerals and strength with aging. This can result in serious bone fractures. Your risk for osteoporosis can be identified using a bone density scan.  If you are 53 years of age or older, or if you are at risk for osteoporosis and fractures, ask your health care provider if you should be screened.  Ask your health care provider whether you should take a calcium or vitamin D supplement to lower your risk for osteoporosis.  Menopause may have certain physical symptoms and risks.  Hormone replacement therapy may reduce some of these symptoms and risks. Talk to your health care provider about whether hormone replacement therapy is right for you.  HOME CARE INSTRUCTIONS   Schedule regular health, dental, and eye exams.  Stay current with your immunizations.   Do not use any tobacco products including cigarettes, chewing tobacco, or electronic cigarettes.  If you are pregnant, do not drink alcohol.  If you are breastfeeding, limit how much and how often you drink alcohol.  Limit alcohol intake to no more than 1 drink per day for nonpregnant women. One drink equals 12  ounces of beer, 5 ounces of wine, or 1 ounces of hard liquor.  Do not use street drugs.  Do not share needles.  Ask your health care provider for help if you need support or information about quitting drugs.  Tell your health care provider if you often feel depressed.  Tell your health care provider if you have ever been abused or do not feel safe at home.   This information is not intended to replace advice given to you by your health care provider. Make sure you discuss any questions you have with your health care provider.   Document Released: 07/28/2010 Document Revised: 02/02/2014 Document Reviewed: 12/14/2012 Elsevier Interactive Patient Education 2016 Corder K. Panosh M.D.

## 2015-01-28 ENCOUNTER — Encounter: Payer: Self-pay | Admitting: Internal Medicine

## 2015-03-01 ENCOUNTER — Other Ambulatory Visit (INDEPENDENT_AMBULATORY_CARE_PROVIDER_SITE_OTHER): Payer: BLUE CROSS/BLUE SHIELD

## 2015-03-01 DIAGNOSIS — G2581 Restless legs syndrome: Secondary | ICD-10-CM | POA: Diagnosis not present

## 2015-03-01 DIAGNOSIS — R7989 Other specified abnormal findings of blood chemistry: Secondary | ICD-10-CM

## 2015-03-01 DIAGNOSIS — R945 Abnormal results of liver function studies: Secondary | ICD-10-CM

## 2015-03-01 LAB — HEPATIC FUNCTION PANEL
ALT: 43 U/L — AB (ref 0–35)
AST: 26 U/L (ref 0–37)
Albumin: 4.2 g/dL (ref 3.5–5.2)
Alkaline Phosphatase: 47 U/L (ref 39–117)
Bilirubin, Direct: 0.2 mg/dL (ref 0.0–0.3)
TOTAL PROTEIN: 6.7 g/dL (ref 6.0–8.3)
Total Bilirubin: 1.1 mg/dL (ref 0.2–1.2)

## 2015-03-01 LAB — FERRITIN: FERRITIN: 189.4 ng/mL (ref 10.0–291.0)

## 2015-03-02 LAB — HEPATITIS C ANTIBODY: HCV Ab: NEGATIVE

## 2015-03-02 LAB — HEPATITIS B SURFACE ANTIGEN: HEP B S AG: NEGATIVE

## 2015-03-02 LAB — HEPATITIS B SURFACE ANTIBODY,QUALITATIVE: HEP B S AB: NEGATIVE

## 2015-03-04 LAB — ANA: ANA: NEGATIVE

## 2015-03-08 ENCOUNTER — Encounter: Payer: Self-pay | Admitting: Internal Medicine

## 2015-03-08 ENCOUNTER — Ambulatory Visit (INDEPENDENT_AMBULATORY_CARE_PROVIDER_SITE_OTHER): Payer: BLUE CROSS/BLUE SHIELD | Admitting: Internal Medicine

## 2015-03-08 VITALS — BP 124/74 | Temp 98.3°F | Wt 174.5 lb

## 2015-03-08 DIAGNOSIS — R053 Chronic cough: Secondary | ICD-10-CM

## 2015-03-08 DIAGNOSIS — I10 Essential (primary) hypertension: Secondary | ICD-10-CM

## 2015-03-08 DIAGNOSIS — Z820 Family history of epilepsy and other diseases of the nervous system: Secondary | ICD-10-CM | POA: Diagnosis not present

## 2015-03-08 DIAGNOSIS — R7989 Other specified abnormal findings of blood chemistry: Secondary | ICD-10-CM | POA: Diagnosis not present

## 2015-03-08 DIAGNOSIS — R05 Cough: Secondary | ICD-10-CM

## 2015-03-08 DIAGNOSIS — R945 Abnormal results of liver function studies: Secondary | ICD-10-CM

## 2015-03-08 NOTE — Progress Notes (Signed)
Pre visit review using our clinic review tool, if applicable. No additional management support is needed unless otherwise documented below in the visit note.   Chief Complaint  Patient presents with  . Follow-up    cough ,med labs lfts    HPI: Patient [name]  comes in today for a number of  problem evaluation. Cough a lot better  On bid protonix   throat clearing som but better  Sleeping better  ? About memory ? Should she get tested  with fam hx GM  y 54s  alzhiemers MOM  71s ... Still living she is doing ok some detail issues  caretaking  Grandchildren  One had flu  ROS: See pertinent positives and negatives per HPI. Had a couple spells where she felt lightheaded the responded to drinking a Coke. One time was going out of a restaurant after having bread felt a bit shaky. No syncope or tachycardia heart symptoms.  Past Medical History  Diagnosis Date  . IBS (irritable bowel syndrome)   . Thyroid goiter     I 131 rx and then  total throidectomy 2005 [facility]  . GERD (gastroesophageal reflux disease)   . Dyspnea     nl PFTs, and echo  . Recurrent oral ulcers   . Heart murmur   . Asthma   . Headache(784.0)   . History of kidney stones   . Nephrolithiasis   . Acute pyelonephritis 05/02/2012  . Pre-diabetes 12/15    Family History  Problem Relation Age of Onset  . Cancer Mother     bladder  . COPD Sister   . Coronary artery disease Sister     age 41 also copd  . Arthritis Other   . Cancer Other     breast  . Diabetes Other   . Hyperlipidemia Other   . Hypertension Other   . Stroke Other   . Heart disease Other   . Coronary artery disease Father     also had AAA  . Aneurysm Father     Aortic and thoracic fatal    Social History   Social History  . Marital Status: Married    Spouse Name: N/A  . Number of Children: N/A  . Years of Education: N/A   Social History Main Topics  . Smoking status: Never Smoker   . Smokeless tobacco: Never Used  . Alcohol  Use: Yes     Comment: seldom  . Drug Use: No  . Sexual Activity: Not Asked   Other Topics Concern  . None   Social History Narrative   Married   Has grandchildren   Never smoked    G4P4   hh of 2  care of GC ages 61 - 90 month  5 days a week    Outpatient Prescriptions Prior to Visit  Medication Sig Dispense Refill  . acetaminophen (TYLENOL) 500 MG tablet Take 1,000 mg by mouth every 6 (six) hours as needed for moderate pain.     . pantoprazole (PROTONIX) 40 MG tablet TAKE 1 TABLET (40 MG TOTAL) BY MOUTH 2 (TWO) TIMES DAILY. 180 tablet 0  . pravastatin (PRAVACHOL) 40 MG tablet TAKE 1 TABLET (40 MG TOTAL) BY MOUTH EVERY EVENING. 90 tablet 0  . rOPINIRole (REQUIP) 0.5 MG tablet TAKE 1 TO 2 TABLETS BY MOUTH AT NOON, AND 5PM AND 3 TABLETS AT BEDTIME 540 tablet 1  . SYNTHROID 88 MCG tablet TAKE 1 TABLET BY MOUTH ONCE DAILY 90 tablet 0  . valsartan (  DIOVAN) 80 MG tablet TAKE 1 TABLET (80 MG TOTAL) BY MOUTH EVERY MORNING. 90 tablet 0  . amoxicillin-clavulanate (AUGMENTIN) 875-125 MG tablet Take 1 tablet by mouth every 12 (twelve) hours. 14 tablet 0   No facility-administered medications prior to visit.     EXAM:  BP 124/74 mmHg  Temp(Src) 98.3 F (36.8 C) (Oral)  Wt 174 lb 8 oz (79.153 kg)  Body mass index is 34.08 kg/(m^2).  GENERAL: vitals reviewed and listed above, alert, oriented, appears well hydrated and in no acute distress HEENT: atraumatic, conjunctiva  clear, no obvious abnormalities on inspection of external nose and ears  NECK: no obvious masses on inspection palpation  LUNGS: clear to auscultation bilaterally, no wheezes, rales or rhonchi, good air movement CV: HRRR, no clubbing cyanosis or  peripheral edema nl cap refill  MS: moves all extremities without noticeable focal  abnormality PSYCH: pleasant and cooperative, no obvious depression or anxiety Lab Results  Component Value Date   WBC 4.4 01/18/2015   HGB 14.2 01/18/2015   HCT 42.4 01/18/2015   PLT 258.0  01/18/2015   GLUCOSE 108* 01/18/2015   CHOL 234* 01/18/2015   TRIG 178.0* 01/18/2015   HDL 51.80 01/18/2015   LDLDIRECT 131.5 01/16/2014   LDLCALC 147* 01/18/2015   ALT 43* 03/01/2015   AST 26 03/01/2015   NA 141 01/18/2015   K 4.5 01/18/2015   CL 103 01/18/2015   CREATININE 0.71 01/18/2015   BUN 18 01/18/2015   CO2 29 01/18/2015   TSH 1.82 01/18/2015   INR 0.97 10/27/2010   HGBA1C 6.1 01/18/2015   BP Readings from Last 3 Encounters:  03/08/15 124/74  01/25/15 130/78  08/17/14 138/80   Hep b serology neg ferritin high normal  ASSESSMENT AND PLAN:  Discussed the following assessment and plan:  Abnormal LFTs - ne serology  plan [place] and if ok lsi and follow   Essential hypertension  Family history of Alzheimer's disease - mom and mgm disc risk benefot of testing cost etc  disc prevention help let   Cough, persistent - better after med and 2 per day ppi  Spell may be reactive blood sugar pre diabetes sx  One happened after  [employer]   dsic diet attention and if ongoing consider gtt.  Discussion about prevention of memory disturbances and cognitive decline. Encouraging aerobic exercise avoiding trauma optimizing cardiovascular risk factors. At this time no other major interventions have been proven to be helpful. Discussed plus minus of genetic testing. There are programs in town and she'll let us know if she wants to do this but at this point I don't think it will change anything in her situation.  -Patient advised to return or notify health care team  if symptoms worsen ,persist or new concerns arise.  Patient Instructions  Continue for now  with protonix  But try to decrease back to one a day .    Continue walking  And exercise .  And getting enough sleep . Will arrange  Ultrasound  of  liver .  To be sure .  And if ok then follow  In 4 months liver panel. If memory problem   Let us know  And can have neuro  Check out. Avoid simple carbs   And if spells continue  consider glucose tolerance test.      Standley Brooking. Panosh M.D.

## 2015-03-08 NOTE — Patient Instructions (Signed)
Continue for now  with protonix  But try to decrease back to one a day .    Continue walking  And exercise .  And getting enough sleep . Will arrange  Ultrasound  of  liver .  To be sure .  And if ok then follow  In 4 months liver panel. If memory problem   Let us know  And can have neuro  Check out. Avoid simple carbs   And if spells continue consider glucose tolerance test.

## 2015-03-18 ENCOUNTER — Ambulatory Visit
Admission: RE | Admit: 2015-03-18 | Discharge: 2015-03-18 | Disposition: A | Discharge status: Home / Self Care | Payer: BLUE CROSS/BLUE SHIELD | Source: Ambulatory Visit | Attending: Internal Medicine | Admitting: Internal Medicine

## 2015-03-18 DIAGNOSIS — R945 Abnormal results of liver function studies: Secondary | ICD-10-CM

## 2015-03-18 DIAGNOSIS — R7989 Other specified abnormal findings of blood chemistry: Secondary | ICD-10-CM

## 2015-04-20 ENCOUNTER — Other Ambulatory Visit: Payer: Self-pay | Admitting: Internal Medicine

## 2015-04-23 ENCOUNTER — Other Ambulatory Visit: Payer: Self-pay | Admitting: Internal Medicine

## 2015-04-23 NOTE — Telephone Encounter (Signed)
Sent to the pharmacy by e-scribe. 

## 2015-06-01 ENCOUNTER — Other Ambulatory Visit: Payer: Self-pay | Admitting: Internal Medicine

## 2015-06-03 NOTE — Telephone Encounter (Signed)
Sent to the pharmacy by e-scribe. 

## 2015-06-22 ENCOUNTER — Other Ambulatory Visit: Payer: Self-pay | Admitting: Internal Medicine

## 2015-06-25 ENCOUNTER — Other Ambulatory Visit: Payer: Self-pay | Admitting: General Practice

## 2015-06-25 MED ORDER — PRAVASTATIN SODIUM 40 MG PO TABS: ORAL_TABLET | ORAL | Status: DC

## 2015-07-15 ENCOUNTER — Other Ambulatory Visit: Payer: Self-pay

## 2015-07-19 ENCOUNTER — Other Ambulatory Visit (INDEPENDENT_AMBULATORY_CARE_PROVIDER_SITE_OTHER): Payer: Medicare Other

## 2015-07-19 DIAGNOSIS — R739 Hyperglycemia, unspecified: Secondary | ICD-10-CM

## 2015-07-19 DIAGNOSIS — R945 Abnormal results of liver function studies: Secondary | ICD-10-CM

## 2015-07-19 LAB — HEMOGLOBIN A1C: Hgb A1c MFr Bld: 6 % (ref 4.6–6.5)

## 2015-07-19 LAB — HEPATIC FUNCTION PANEL
ALBUMIN: 4.3 g/dL (ref 3.5–5.2)
ALT: 53 U/L — ABNORMAL HIGH (ref 0–35)
AST: 28 U/L (ref 0–37)
Alkaline Phosphatase: 44 U/L (ref 39–117)
Bilirubin, Direct: 0.1 mg/dL (ref 0.0–0.3)
TOTAL PROTEIN: 6.5 g/dL (ref 6.0–8.3)
Total Bilirubin: 1.2 mg/dL (ref 0.2–1.2)

## 2015-07-29 ENCOUNTER — Other Ambulatory Visit: Payer: Self-pay | Admitting: Internal Medicine

## 2015-07-31 NOTE — Telephone Encounter (Signed)
Sent to the pharmacy by e-scribe. 

## 2015-09-20 ENCOUNTER — Other Ambulatory Visit: Payer: Self-pay | Admitting: Internal Medicine

## 2015-09-24 ENCOUNTER — Telehealth: Payer: Self-pay | Admitting: Internal Medicine

## 2015-09-24 NOTE — Telephone Encounter (Signed)
Sent to the pharmacy by e-scribe for 90 days.  Pt due in Dec for wellness.  Will send a message to scheduling.

## 2015-09-24 NOTE — Telephone Encounter (Signed)
Pt req refill

## 2015-09-24 NOTE — Telephone Encounter (Signed)
° °  valsartan (DIOVAN) 80 MG tablet   pravastatin (PRAVACHOL) 40 MG tablet

## 2015-09-24 NOTE — Telephone Encounter (Signed)
Pt due for cpx or wellness in Dec.  Unsure what insurance she will have.  Now 65 years old.  Please schedule accordingly and send back if pt needs lab orders entered.  Medication sent to the pharmacy.

## 2015-09-25 NOTE — Telephone Encounter (Signed)
Noted  

## 2015-09-25 NOTE — Telephone Encounter (Signed)
Spoke with pt she will call back to schedp cpx

## 2015-10-29 ENCOUNTER — Other Ambulatory Visit: Payer: Self-pay | Admitting: Internal Medicine

## 2015-11-01 ENCOUNTER — Telehealth: Payer: Self-pay | Admitting: Family Medicine

## 2015-11-01 NOTE — Telephone Encounter (Signed)
Pt due for cpx or medicare wellness.  Not sure what insurance she has.  Now 65 years of age.  May have medicare.  Please schedule.  Send message back if I need to put in lab orders (cpx).  Thanks!!

## 2015-11-01 NOTE — Telephone Encounter (Signed)
lmom for pt to call back

## 2015-11-01 NOTE — Telephone Encounter (Signed)
Sent to the pharmacy by e-scribe for 90 days.  Message sent to scheduling.  Due for cpx or wellness in Dec.

## 2015-11-08 NOTE — Telephone Encounter (Signed)
Pt has been sch

## 2015-11-08 NOTE — Telephone Encounter (Signed)
Find an 8:45 AM [place] and use that.  Should be more available [facility] slots this time of year.

## 2015-11-08 NOTE — Telephone Encounter (Signed)
Pt needs early morning appt for cpx. Pt does not want to wait until next year. Can I create 30 min appt.? Pt is out of town nov 2-17

## 2015-11-16 ENCOUNTER — Other Ambulatory Visit: Payer: Self-pay | Admitting: Internal Medicine

## 2015-11-20 NOTE — Telephone Encounter (Signed)
Refill x 90 days  Has  cpx yearly in December

## 2015-11-20 NOTE — Telephone Encounter (Signed)
Sent to the pharmacy by e-scribe. 

## 2015-12-13 ENCOUNTER — Encounter: Payer: Self-pay | Admitting: Adult Health

## 2015-12-13 ENCOUNTER — Other Ambulatory Visit: Payer: Self-pay | Admitting: Internal Medicine

## 2015-12-13 ENCOUNTER — Ambulatory Visit (INDEPENDENT_AMBULATORY_CARE_PROVIDER_SITE_OTHER): Payer: Medicare Other | Admitting: Adult Health

## 2015-12-13 VITALS — BP 144/70 | HR 72 | Temp 98.8°F | Ht 60.0 in | Wt 169.5 lb

## 2015-12-13 DIAGNOSIS — J069 Acute upper respiratory infection, unspecified: Secondary | ICD-10-CM

## 2015-12-13 MED ORDER — HYDROCODONE-HOMATROPINE 5-1.5 MG/5ML PO SYRP: 5.0000 mL | ORAL_SOLUTION | Freq: Three times a day (TID) | ORAL | 0 refills | Status: DC | PRN

## 2015-12-13 MED ORDER — DOXYCYCLINE HYCLATE 100 MG PO CAPS: 100.0000 mg | ORAL_CAPSULE | Freq: Two times a day (BID) | ORAL | 0 refills | Status: DC

## 2015-12-13 MED ORDER — PREDNISONE 10 MG PO TABS: ORAL_TABLET | ORAL | 0 refills | Status: DC

## 2015-12-13 NOTE — Progress Notes (Signed)
Pre visit review using our clinic review tool, if applicable. No additional management support is needed unless otherwise documented below in the visit note. 

## 2015-12-13 NOTE — Progress Notes (Signed)
   Subjective:    Patient ID: [name], female    DOB: 24-Oct-1950, 65 y.o.   MRN: [id number]  URI   This is a new problem. The current episode started in the past 7 days. The problem has been gradually worsening. There has been no fever. Associated symptoms include congestion, coughing (dry ), ear pain (left ), headaches, rhinorrhea, sinus pain and a sore throat. Pertinent negatives include no joint swelling, plugged ear sensation or wheezing. The treatment provided no relief.   She recently traveled from [place] on a plane. Her travel companions became sick and one has developed pneumonia   Review of Systems  Constitutional: Positive for fatigue.  HENT: Positive for congestion, ear pain (left ), rhinorrhea, sinus pain and sore throat.   Respiratory: Positive for cough (dry ). Negative for chest tightness and wheezing.   Neurological: Positive for headaches. Negative for dizziness.       Objective:   Physical Exam  Constitutional: She is oriented to person, place, and time. She appears well-developed and well-nourished. No distress.  HENT:  Head: Normocephalic and atraumatic.  Right Ear: Hearing, tympanic membrane, external ear and ear canal normal. Tympanic membrane is not erythematous and not bulging.  Left Ear: Hearing, tympanic membrane, external ear and ear canal normal. Tympanic membrane is not erythematous and not bulging.  Nose: Mucosal edema and rhinorrhea present. Right sinus exhibits maxillary sinus tenderness and frontal sinus tenderness. Left sinus exhibits maxillary sinus tenderness and frontal sinus tenderness.  Mouth/Throat: Uvula is midline, oropharynx is clear and moist and mucous membranes are normal. No oropharyngeal exudate, posterior oropharyngeal edema, posterior oropharyngeal erythema or tonsillar abscesses.  Eyes: Conjunctivae and EOM are normal. Pupils are equal, round, and reactive to light. Right eye exhibits no discharge. Left eye exhibits no discharge.  No scleral icterus.  Neck: Normal range of motion. Neck supple.  Cardiovascular: Normal rate, regular rhythm, normal heart sounds and intact distal pulses.  Exam reveals no gallop and no friction rub.   No murmur heard. Pulmonary/Chest: Effort normal and breath sounds normal. No respiratory distress. She has no wheezes. She has no rales. She exhibits no tenderness.  Neurological: She is alert and oriented to person, place, and time.  Skin: Skin is warm and dry. No rash noted. She is not diaphoretic. No erythema. No pallor.  Psychiatric: She has a normal mood and affect. Her behavior is normal. Thought content normal.  Vitals reviewed.     Assessment & Plan:  1. Acute upper respiratory infection - predniSONE (DELTASONE) 10 MG tablet; 40 mg x 3 days, 20 mg x 3 days, 10 mg x 3 days  Dispense: 21 tablet; Refill: 0 - HYDROcodone-homatropine (HYCODAN) 5-1.5 MG/5ML syrup; Take 5 mLs by mouth every 8 (eight) hours as needed for cough.  Dispense: 120 mL; Refill: 0 - doxycycline (VIBRAMYCIN) 100 MG capsule; Take 1 capsule (100 mg total) by mouth 2 (two) times daily.  Dispense: 14 capsule; Refill: 0 - Follow up with PCP if no improvement   Dorothyann Peng, NP

## 2015-12-16 NOTE — Telephone Encounter (Signed)
Sent to the pharmacy by e-scribe for 90 days.  Pt has upcoming cpx on 01/28/16.

## 2016-01-25 ENCOUNTER — Other Ambulatory Visit: Payer: Self-pay | Admitting: Internal Medicine

## 2016-01-27 NOTE — Progress Notes (Signed)
Pre visit review using our clinic review tool, if applicable. No additional management support is needed unless otherwise documented below in the visit note.  Chief Complaint  Patient presents with  . Medicare Wellness    welcome to medicare     HPI: [name] 66 y.o. comes in today for welcome to  Medicare   wellness visit .Since last visit. See below  Ongoing  conditions   Ht  Same med  LIPIDS medsd denises se  RLS meds helps sx  ? To continue uncertain how much  GERD  Stable on meds Has cough getting better after  Resp infection no sob  Concern about dementia in family  And how to avoid    Health Maintenance  Topic Date Due  . HIV Screening  07/19/1965  . INFLUENZA VACCINE  06/25/2016 (Originally 08/27/2015)  . MAMMOGRAM  04/24/2016  . PNA vac Low Risk Adult (2 of 2 - PPSV23) 01/27/2017  . COLONOSCOPY  08/30/2022  . TETANUS/TDAP  01/25/2023  . DEXA SCAN  Completed  . ZOSTAVAX  Completed  . Hepatitis C Screening  Completed   Health Maintenance Review LIFESTYLE:  TAD Sugar beverages:  Ginger ale .  Soda  Sleep:  careataking    hh of 3 spouse  And mom  Has alzheimer  And bladder cancer .  Caretaker .   Since 24  Hours .  Does adls    MEDICARE DOCUMENT QUESTIONS  TO SCAN   Hearing: ok  Vision:  No limitations at present . Last eye check UTD  Safety:  Has smoke detector and wears seat belts.  No firearms. No excess sun exposure. Sees dentist regularly.  Falls: n  Advance directive :  Reviewed  Has one.  Memory: Felt to be good  , no concern from her or her family.  Depression: No anhedonia unusual crying or depressive symptoms tired   Nutrition: Eats well balanced diet; adequate calcium and vitamin D. No swallowing chewing problems.  Injury: no major injuries in the last six months.  Other healthcare providers:  Reviewed today .  Social:  Lives with spouse married. No pets.  gks caretaking   Preventive parameters: up-to-date  Reviewed   ADLS:    There are no problems or need for assistance  driving, feeding, obtaining food, dressing, toileting and bathing, managing money using phone. She is independent.    ROS: see above   Cough getting better  GEN/ HEENT: No fever, significant weight changes sweats headaches vision problems hearing changes, CV/ PULM; No chest pain shortness of breath syncope,edema  change in exercise tolerance. GI /GU: No adominal pain, vomiting, change in bowel habits. No blood in the stool. No significant GU symptoms. SKIN/HEME: ,no acute skin rashes suspicious lesions or bleeding. No lymphadenopathy, nodules, masses.  NEURO/ PSYCH:  No neurologic signs such as weakness numbness. No depression anxiety. IMM/ Allergy: No unusual infections.  Allergy .   REST of 12 system review negative except as per HPI   Past Medical History:  Diagnosis Date  . Acute pyelonephritis 05/02/2012  . Asthma   . Dyspnea    nl PFTs, and echo  . GERD (gastroesophageal reflux disease)   . Headache(784.0)   . Heart murmur   . History of kidney stones   . IBS (irritable bowel syndrome)   . Nephrolithiasis   . Pre-diabetes 12/15  . Recurrent oral ulcers   . Thyroid goiter    I 131 rx and then  total throidectomy 2005 [facility]  Family History  Problem Relation Age of Onset  . Cancer Mother     bladder  . COPD Sister   . Coronary artery disease Sister     age 31 also copd  . Arthritis Other   . Cancer Other     breast  . Diabetes Other   . Hyperlipidemia Other   . Hypertension Other   . Stroke Other   . Heart disease Other   . Coronary artery disease Father     also had AAA  . Aneurysm Father     Aortic and thoracic fatal    Social History   Social History  . Marital status: Married    Spouse name: N/A  . Number of children: N/A  . Years of education: N/A   Social History Main Topics  . Smoking status: Never Smoker  . Smokeless tobacco: Never Used  . Alcohol use Yes     Comment: seldom  . Drug use: No    . Sexual activity: Not Asked   Other Topics Concern  . 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 acetaminophen (TYLENOL) 500 MG tablet Take 1,000 mg by mouth every 6 (six) hours as needed for moderate pain.   . pravastatin (PRAVACHOL) 40 MG tablet TAKE 1 TABLET (40 MG TOTAL) BY MOUTH EVERY EVENING.  . rOPINIRole (REQUIP) 0.5 MG tablet TAKE 1 TO 2 TABLETS BY MOUTH AT NOON, AND 5PM AND 3 TABLETS AT BEDTIME  . valsartan (DIOVAN) 80 MG tablet TAKE 1 TABLET (80 MG TOTAL) BY MOUTH EVERY MORNING.  . [DISCONTINUED] pantoprazole (PROTONIX) 40 MG tablet TAKE 1 TABLET (40 MG TOTAL) BY MOUTH 2 (TWO) TIMES DAILY.  . [DISCONTINUED] SYNTHROID 88 MCG tablet TAKE 1 TABLET BY MOUTH ONCE DAILY  . [DISCONTINUED] doxycycline (VIBRAMYCIN) 100 MG capsule Take 1 capsule (100 mg total) by mouth 2 (two) times daily.  . [DISCONTINUED] HYDROcodone-homatropine (HYCODAN) 5-1.5 MG/5ML syrup Take 5 mLs by mouth every 8 (eight) hours as needed for cough.  . [DISCONTINUED] predniSONE (DELTASONE) 10 MG tablet 40 mg x 3 days, 20 mg x 3 days, 10 mg x 3 days   No facility-administered encounter medications on file as of 01/28/2016.     EXAM:  BP 138/78 (BP Location: Left Arm, Patient Position: Sitting, Cuff Size: Large)   Temp 98 F (36.7 C) (Oral)   Ht 5' (1.524 m)   Wt 170 lb (77.1 kg)   BMI 33.20 kg/m   Body mass index is 33.2 kg/m.  Physical Exam: Vital signs reviewed [id number] is a well-developed well-nourished alert cooperative   who appears stated age in no acute distress.  HEENT: normocephalic atraumatic , Eyes: PERRL EOM's full, conjunctiva clear, Nares: paten,t no deformity discharge or tenderness., Ears: no deformity EAC's clear TMs with normal landmarks. Mouth: clear OP, no lesions, edema.  Moist mucous membranes. Dentition in  adequate repair. NECK: supple without masses, thyromegaly or bruits. CHEST/PULM:  Clear to auscultation and percussion breath sounds equal no wheeze , rales or rhonchi. No chest wall deformities or tenderness.Breast: normal by inspection . No dimpling, discharge, masses, tenderness or discharge . CV: PMI is nondisplaced, S1 S2 no gallops, murmurs, rubs. Peripheral pulses are full without delay.No JVD .  ABDOMEN:  Bowel sounds normal nontender  No guard or rebound, no hepato splenomegal no CVA tenderness.   Extremtities:  No clubbing cyanosis or edema, no acute joint swelling or redness no focal atrophy NEURO:  Oriented x3, cranial nerves 3-12 appear to be intact, no obvious focal weakness,gait within normal limits no abnormal reflexes or asymmetrical SKIN: No acute rashes normal turgor, color, no bruising or petechiae. PSYCH: Oriented, good eye contact, no obvious depression anxiety, cognition and judgment appear normal. LN: no cervical axillary inguinal adenopathy No noted deficits in memory, attention, and speech.   ASSESSMENT AND PLAN:  Discussed the following assessment and plan:  Welcome to Medicare preventive visit - Plan: Basic metabolic panel, CBC with Differential/Platelet, Hepatic function panel, Lipid panel, TSH, POCT urinalysis dipstick  Medication management  Essential hypertension - Plan: Basic metabolic panel, CBC with Differential/Platelet, Hemoglobin A1c, Hepatic function panel, Lipid panel, TSH  Fasting hyperglycemia - Plan: Basic metabolic panel, CBC with Differential/Platelet, Hemoglobin A1c, Hepatic function panel, Lipid panel, TSH  Hyperlipidemia, unspecified hyperlipidemia type - Plan: Basic metabolic panel, CBC with Differential/Platelet, Hemoglobin A1c, Hepatic function panel, Lipid panel, TSH  Abnormal LFTs - Plan: Basic metabolic panel, CBC with Differential/Platelet, Hemoglobin A1c, Hepatic function panel, Lipid panel, TSH  Lower urinary tract symptoms (LUTS) -  Plan: POCT urinalysis dipstick  Need for vaccination with 13-polyvalent pneumococcal conjugate vaccine - Plan: Pneumococcal conjugate vaccine 13-valent IM pna 13?  Lab monitoring   Addressed  hc conditions   And  followup  And prevention parameteres Patient Care Team: Burnis Medin, MD as PCP - General Sallyanne Havers, MD as Referring Physician (Orthopedic Surgery) Lowella Bandy, MD as Attending Physician (Urology) Josue Hector, MD as Consulting Physician (Cardiology)  Patient Instructions   Attention  lifestyle intervention healthy eating and exercise . Even  Mild weight loss can help  Your reflux  And metabolic situation.  Exercise,  Control of bp  Best way to avoid getting dementia .Marland Kitchen Fu  6 months depending on  Results and how you are doing . Dont be  Hesitant to ask for help with caretaking and take care of your own health.             Standley Brooking. Panosh M.D.  Lab Results  Component Value Date   WBC 6.0 01/28/2016   HGB 14.1 01/28/2016   HCT 40.6 01/28/2016   PLT 253.0 01/28/2016   GLUCOSE 96 01/28/2016   CHOL 227 (H) 01/28/2016   TRIG 209.0 (H) 01/28/2016   HDL 51.80 01/28/2016   LDLDIRECT 142.0 01/28/2016   LDLCALC 147 (H) 01/18/2015   ALT 37 (H) 01/28/2016   AST 22 01/28/2016   NA 139 01/28/2016   K 4.4 01/28/2016   CL 103 01/28/2016   CREATININE 0.83 01/28/2016   BUN 23 01/28/2016   CO2 29 01/28/2016   TSH 4.46 01/28/2016   INR 0.97 10/27/2010   HGBA1C 6.2 01/28/2016

## 2016-01-28 ENCOUNTER — Ambulatory Visit (INDEPENDENT_AMBULATORY_CARE_PROVIDER_SITE_OTHER): Payer: Medicare Other | Admitting: Internal Medicine

## 2016-01-28 ENCOUNTER — Encounter: Payer: Self-pay | Admitting: Internal Medicine

## 2016-01-28 VITALS — BP 138/78 | Temp 98.0°F | Ht 60.0 in | Wt 170.0 lb

## 2016-01-28 DIAGNOSIS — Z79899 Other long term (current) drug therapy: Secondary | ICD-10-CM | POA: Diagnosis not present

## 2016-01-28 DIAGNOSIS — I10 Essential (primary) hypertension: Secondary | ICD-10-CM | POA: Diagnosis not present

## 2016-01-28 DIAGNOSIS — R7301 Impaired fasting glucose: Secondary | ICD-10-CM

## 2016-01-28 DIAGNOSIS — Z0000 Encounter for general adult medical examination without abnormal findings: Secondary | ICD-10-CM

## 2016-01-28 DIAGNOSIS — R7989 Other specified abnormal findings of blood chemistry: Secondary | ICD-10-CM

## 2016-01-28 DIAGNOSIS — Z23 Encounter for immunization: Secondary | ICD-10-CM

## 2016-01-28 DIAGNOSIS — R399 Unspecified symptoms and signs involving the genitourinary system: Secondary | ICD-10-CM

## 2016-01-28 DIAGNOSIS — R945 Abnormal results of liver function studies: Secondary | ICD-10-CM

## 2016-01-28 DIAGNOSIS — E785 Hyperlipidemia, unspecified: Secondary | ICD-10-CM

## 2016-01-28 LAB — POCT URINALYSIS DIP (MANUAL ENTRY)
Bilirubin, UA: NEGATIVE
GLUCOSE UA: NEGATIVE
Ketones, POC UA: NEGATIVE
LEUKOCYTES UA: NEGATIVE
NITRITE UA: NEGATIVE
Protein Ur, POC: NEGATIVE
RBC UA: NEGATIVE
Spec Grav, UA: 1.02
UROBILINOGEN UA: 0.2
pH, UA: 5.5

## 2016-01-28 LAB — LDL CHOLESTEROL, DIRECT: LDL DIRECT: 142 mg/dL

## 2016-01-28 LAB — CBC WITH DIFFERENTIAL/PLATELET
BASOS PCT: 0.6 % (ref 0.0–3.0)
Basophils Absolute: 0 10*3/uL (ref 0.0–0.1)
EOS PCT: 1.4 % (ref 0.0–5.0)
Eosinophils Absolute: 0.1 10*3/uL (ref 0.0–0.7)
HCT: 40.6 % (ref 36.0–46.0)
Hemoglobin: 14.1 g/dL (ref 12.0–15.0)
LYMPHS ABS: 1.8 10*3/uL (ref 0.7–4.0)
LYMPHS PCT: 29.4 % (ref 12.0–46.0)
MCHC: 34.8 g/dL (ref 30.0–36.0)
MCV: 95.4 fl (ref 78.0–100.0)
MONOS PCT: 7.5 % (ref 3.0–12.0)
Monocytes Absolute: 0.4 10*3/uL (ref 0.1–1.0)
NEUTROS ABS: 3.7 10*3/uL (ref 1.4–7.7)
NEUTROS PCT: 61.1 % (ref 43.0–77.0)
PLATELETS: 253 10*3/uL (ref 150.0–400.0)
RBC: 4.25 Mil/uL (ref 3.87–5.11)
RDW: 13.7 % (ref 11.5–15.5)
WBC: 6 10*3/uL (ref 4.0–10.5)

## 2016-01-28 LAB — HEPATIC FUNCTION PANEL
ALBUMIN: 4.5 g/dL (ref 3.5–5.2)
ALT: 37 U/L — AB (ref 0–35)
AST: 22 U/L (ref 0–37)
Alkaline Phosphatase: 46 U/L (ref 39–117)
BILIRUBIN DIRECT: 0.1 mg/dL (ref 0.0–0.3)
TOTAL PROTEIN: 6.5 g/dL (ref 6.0–8.3)
Total Bilirubin: 1.1 mg/dL (ref 0.2–1.2)

## 2016-01-28 LAB — LIPID PANEL
CHOLESTEROL: 227 mg/dL — AB (ref 0–200)
HDL: 51.8 mg/dL (ref >39.00)
NONHDL: 175.52
TRIGLYCERIDES: 209 mg/dL — AB (ref 0.0–149.0)
Total CHOL/HDL Ratio: 4
VLDL: 41.8 mg/dL — ABNORMAL HIGH (ref 0.0–40.0)

## 2016-01-28 LAB — TSH: TSH: 4.46 u[IU]/mL (ref 0.35–4.50)

## 2016-01-28 LAB — BASIC METABOLIC PANEL
BUN: 23 mg/dL (ref 6–23)
CHLORIDE: 103 meq/L (ref 96–112)
CO2: 29 meq/L (ref 19–32)
Calcium: 9.5 mg/dL (ref 8.4–10.5)
Creatinine, Ser: 0.83 mg/dL (ref 0.40–1.20)
GFR: 73.21 mL/min (ref >60.00)
GLUCOSE: 96 mg/dL (ref 70–99)
Potassium: 4.4 mEq/L (ref 3.5–5.1)
SODIUM: 139 meq/L (ref 135–145)

## 2016-01-28 LAB — HEMOGLOBIN A1C: Hgb A1c MFr Bld: 6.2 % (ref 4.6–6.5)

## 2016-01-28 NOTE — Telephone Encounter (Signed)
Sent to the pharmacy by e-scribe for 1 year.  Pt seen today and had lab work.  Normal TSH.

## 2016-01-28 NOTE — Patient Instructions (Addendum)
  Attention  lifestyle intervention healthy eating and exercise . Even  Mild weight loss can help  Your reflux  And metabolic situation.  Exercise,  Control of bp  Best way to avoid getting dementia .Marland Kitchen Fu  6 months depending on  Results and how you are doing . Dont be  Hesitant to ask for help with caretaking and take care of your own health.

## 2016-02-06 ENCOUNTER — Other Ambulatory Visit: Payer: Self-pay | Admitting: Internal Medicine

## 2016-02-06 NOTE — Telephone Encounter (Signed)
Sent to the pharmacy by e-scribe for 6 months. Seen on 01/28/16 and asked to return in 6 months.

## 2016-02-18 ENCOUNTER — Other Ambulatory Visit: Payer: Self-pay | Admitting: Internal Medicine

## 2016-02-20 NOTE — Telephone Encounter (Signed)
Sent to the pharmacy by e-scribe for 6 months. 

## 2016-03-21 ENCOUNTER — Other Ambulatory Visit: Payer: Self-pay | Admitting: Internal Medicine

## 2016-04-13 ENCOUNTER — Emergency Department (HOSPITAL_COMMUNITY)
Admission: EM | Admit: 2016-04-13 | Discharge: 2016-04-13 | Disposition: A | Discharge status: Home / Self Care | Payer: Medicare Other | Attending: Emergency Medicine | Admitting: Emergency Medicine

## 2016-04-13 ENCOUNTER — Encounter (HOSPITAL_COMMUNITY): Payer: Self-pay | Admitting: Emergency Medicine

## 2016-04-13 DIAGNOSIS — I10 Essential (primary) hypertension: Secondary | ICD-10-CM | POA: Diagnosis not present

## 2016-04-13 DIAGNOSIS — E039 Hypothyroidism, unspecified: Secondary | ICD-10-CM | POA: Insufficient documentation

## 2016-04-13 DIAGNOSIS — J45909 Unspecified asthma, uncomplicated: Secondary | ICD-10-CM | POA: Insufficient documentation

## 2016-04-13 DIAGNOSIS — M25562 Pain in left knee: Secondary | ICD-10-CM | POA: Diagnosis not present

## 2016-04-13 DIAGNOSIS — M79605 Pain in left leg: Secondary | ICD-10-CM | POA: Insufficient documentation

## 2016-04-13 MED ORDER — ENOXAPARIN SODIUM 80 MG/0.8ML ~~LOC~~ SOLN
1.0000 mg/kg | Freq: Once | SUBCUTANEOUS | Status: AC
  Administered 2016-04-13: 75 mg via SUBCUTANEOUS
  Filled 2016-04-13: qty 0.8

## 2016-04-13 MED ORDER — OXYCODONE-ACETAMINOPHEN 5-325 MG PO TABS
2.0000 | ORAL_TABLET | Freq: Once | ORAL | Status: AC
  Administered 2016-04-13: 2 via ORAL
  Filled 2016-04-13: qty 2

## 2016-04-13 NOTE — ED Provider Notes (Signed)
[facility] DEPT Provider Note   CSN: [id number] Arrival date & time: 04/13/16  2136     History   Chief Complaint Chief Complaint  Patient presents with  . Leg Pain    HPI [name] is a 66 y.o. female who presents with a one-day history of left lower extremity pain. Patient has had associated swelling Lin redness to her lower leg Lin calf. Patient has had pain to the back of her calf Lin has had some pain radiating up to her left hip. Patient reports that she has been helping her mother in hospice for the past 4 weeks Lin has been sitting or laying mostly. Patient has some shortness of breath at baseline, but she does not feel that it is any worse. She denies any chest pain. Patient denies any recent surgeries or recent long trips. Patient did not take any medications for her symptoms. Patient was seen at the orthopedic urgent care Lin sent here for further evaluation. Patient denies any fevers, abdominal pain, nausea, vomiting, urinary symptoms.  HPI  Past Medical History:  Diagnosis Date  . Acute pyelonephritis 05/02/2012  . Asthma   . Dyspnea    nl PFTs, Lin echo  . GERD (gastroesophageal reflux disease)   . Headache(784.0)   . Heart murmur   . History of kidney stones   . IBS (irritable bowel syndrome)   . Nephrolithiasis   . Pre-diabetes 12/15  . Recurrent oral ulcers   . Thyroid goiter    I 131 rx Lin then  total throidectomy 2005 [facility]    Patient Active Problem List   Diagnosis Date Noted  . Essential hypertension 08/17/2014  . Unspecified essential hypertension 08/01/2013  . Mild concussion 07/18/2013  . Head injury, unspecified 07/18/2013  . Back strain 07/18/2013  . Fall 07/18/2013  . Stress 06/26/2013  . Upper airway cough syndrome 06/15/2013  . Murmur 02/28/2013  . Chest discomfort 01/24/2013  . Dyspnea 01/24/2013  . Elevated blood pressure reading 01/24/2013  . Stressful life event affecting family 01/24/2013  . Decreased exercise  tolerance 01/24/2013  . Pre-diabetes 04/29/2012  . Abdominal swelling, left lower quadrant 04/29/2012  . Tingling in extremities 01/27/2012  . Cough, persistent 04/18/2011  . Sleep disturbance 11/23/2010  . Family history of premature coronary heart disease 11/23/2010  . Leg pain 11/17/2010  . Osteopenia 04/25/2010  . CATARACT, LEFT EYE 11/27/2009  . PURE HYPERCHOLESTEROLEMIA 01/29/2009  . OBESITY 01/12/2008  . DYSPNEA 01/12/2008  . ABDOMINAL ULTRASOUND, ABNORMAL 12/26/2007  . HYPERGLYCEMIA 12/14/2007  . LIVER FUNCTION TESTS, ABNORMAL 12/14/2007  . NEPHROLITHIASIS, HX OF 11/06/2007  . VITAMIN D DEFICIENCY 11/25/2006  . HYPOTHYROIDISM 10/04/2006  . HYPERLIPIDEMIA NEC/NOS 10/04/2006  . SYNDROME, RESTLESS LEGS 10/04/2006  . DISORDER, BONE/CARTILAGE NOS 10/04/2006  . WEIGHT GAIN 10/04/2006  . ELEVATION, TRANSAMINASE/LDH LEVELS 10/04/2006  . ASTHMA 09/30/2006  . GERD 09/30/2006    Past Surgical History:  Procedure Laterality Date  . ABDOMINAL HYSTERECTOMY  1994   fibroid  . APPENDECTOMY  1968  . orif left tivial plateau fracture     Dr. Collier Salina 2/08  . plates Lin pins take out of left knee  06/2008  . TONSILLECTOMY  1979  . TOTAL THYROIDECTOMY  2005   for  large nodule 2006    OB History    Gravida Para Term Preterm AB Living   4 4           SAB TAB Ectopic Multiple Live Births  Home Medications    Prior to Admission medications   Medication Sig Start Date End Date Taking? Authorizing Provider  acetaminophen (TYLENOL) 500 MG tablet Take 1,000 mg by mouth every 6 (six) hours as needed for moderate pain.     Historical Provider, MD  pantoprazole (PROTONIX) 40 MG tablet TAKE 1 TABLET (40 MG TOTAL) BY MOUTH 2 (TWO) TIMES DAILY. 02/06/16   Burnis Medin, MD  pravastatin (PRAVACHOL) 40 MG tablet TAKE 1 TABLET (40 MG TOTAL) BY MOUTH EVERY EVENING. 03/23/16   Burnis Medin, MD  rOPINIRole (REQUIP) 0.5 MG tablet TAKE 1 TO 2 TABLETS BY MOUTH AT NOON, Lin  5PM Lin 3 TABLETS AT BEDTIME 02/20/16   Burnis Medin, MD  SYNTHROID 88 MCG tablet TAKE 1 TABLET BY MOUTH ONCE DAILY 01/28/16   Burnis Medin, MD  valsartan (DIOVAN) 80 MG tablet TAKE 1 TABLET (80 MG TOTAL) BY MOUTH EVERY MORNING. 03/23/16   Burnis Medin, MD    Family History Family History  Problem Relation Age of Onset  . COPD Sister   . Coronary artery disease Sister     age 59 also copd  . Cancer Mother     bladder  . Arthritis Other   . Cancer Other     breast  . Diabetes Other   . Hyperlipidemia Other   . Hypertension Other   . Stroke Other   . Heart disease Other   . Coronary artery disease Father     also had AAA  . Aneurysm Father     Aortic Lin thoracic fatal    Social History Social History  Substance Use Topics  . Smoking status: Never Smoker  . Smokeless tobacco: Never Used  . Alcohol use Yes     Comment: seldom     Allergies   Ciprofloxacin; Propofol; Lin Ritalin [methylphenidate hcl]   Review of Systems Review of Systems  Constitutional: Negative for chills Lin fever.  HENT: Negative for facial swelling Lin sore throat.   Respiratory: Positive for shortness of breath (baseline).   Cardiovascular: Negative for chest pain.  Gastrointestinal: Negative for abdominal pain, nausea Lin vomiting.  Genitourinary: Negative for dysuria.  Musculoskeletal: Negative for back pain.  Skin: Negative for rash Lin wound.  Neurological: Negative for headaches.  Psychiatric/Behavioral: The patient is not nervous/anxious.      Physical Exam Updated Vital Signs BP (!) 161/81 (BP Location: Left Arm)   Pulse 73   Temp 97.9 F (36.6 C) (Oral)   Resp 18   Ht 4\' 11"  (1.499 m)   Wt 77.1 kg   SpO2 95%   BMI 34.34 kg/m   Physical Exam  Constitutional: She appears well-developed Lin well-nourished. No distress.  HENT:  Head: Normocephalic Lin atraumatic.  Mouth/Throat: Oropharynx is clear Lin moist. No oropharyngeal exudate.  Eyes: Conjunctivae are normal.  Pupils are equal, round, Lin reactive to light. Right eye exhibits no discharge. Left eye exhibits no discharge. No scleral icterus.  Neck: Normal range of motion. Neck supple. No thyromegaly present.  Cardiovascular: Normal rate, regular rhythm, normal heart sounds Lin intact distal pulses.  Exam reveals no gallop Lin no friction rub.   No murmur heard. Pulmonary/Chest: Effort normal Lin breath sounds normal. No stridor. No respiratory distress. She has no wheezes. She has no rales.  Abdominal: Soft. Bowel sounds are normal. She exhibits no distension. There is no tenderness. There is no rebound Lin no guarding.  Musculoskeletal: She exhibits edema.  Unilateral left lower extremity  edema with erythema, tenderness to posterior calf, upper leg, Lin hip; 5/5 strength to bilateral lower extremities; normal sensation; DP pulses intact  Lymphadenopathy:    She has no cervical adenopathy.  Neurological: She is alert. Coordination normal.  Skin: Skin is warm Lin dry. No rash noted. She is not diaphoretic. No pallor.  Psychiatric: She has a normal mood Lin affect.  Nursing note Lin vitals reviewed.    ED Treatments / Results  Labs (all labs ordered are listed, but only abnormal results are displayed) Labs Reviewed - No data to display  EKG  EKG Interpretation None       Radiology No results found.  Procedures Procedures (including critical care time)  Medications Ordered in ED Medications  enoxaparin (LOVENOX) injection 75 mg (not administered)  oxyCODONE-acetaminophen (PERCOCET/ROXICET) 5-325 MG per tablet 2 tablet (not administered)     Initial Impression / Assessment Lin Plan / ED Course  I have reviewed the triage vital signs Lin the nursing notes.  Pertinent labs & imaging results that were available during my care of the patient were reviewed by me Lin considered in my medical decision making (see chart for details).     Concern for DVT left lower extremity, however  venous ultrasound is no longer available tonight. Will treat patient with Lovenox in the ED Lin have patient follow up at [facility] at 8 am tomorrow for ultrasound. Patient given Percocet for pain control in ED. Low suspicion for pulmonary embolism at this time, as patient is not tachycardic or short of breath, or expressing any chest pain. Strict return precautions given. Patient Lin husband understand Lin agree with plan. Patient vitals stable throughout ED course Lin discharged in satisfactory condition. Patient also evaluated by Dr. Zenia Resides who guided the patient's management Lin agrees with plan.  Final Clinical Impressions(s) / ED Diagnoses   Final diagnoses:  Left leg pain    New Prescriptions New Prescriptions   No medications on file     Frederica Kuster, Hershal Coria 04/13/16 2303

## 2016-04-13 NOTE — ED Provider Notes (Signed)
Medical screening examination/treatment/procedure(s) were conducted as a shared visit with non-physician practitioner(s) and myself.  I personally evaluated the patient during the encounter.   EKG Interpretation None     Patient here complaining of right lower leg pain times several days. On exam she is neurovascular intact. Does have some mild edema with erythema from the knee down to the foot. She also complain of having intermittent dyspnea however denies any pleuritic component to this and states that she has long history of being short of breath. No resting tachycardia. Patient be treated with Lovenox here return in the morning for ultrasound.   Lacretia Leigh, MD 04/13/16 2248

## 2016-04-13 NOTE — Discharge Instructions (Signed)
Please go to [facility] tomorrow at 8 AM for an ultrasound. Specific directions are outlined below. Please return to emergency department immediately if you develop any chest pain, worsening shortness of breath out of the ordinary for you, elevated heart rate over 100 beats per minute, passing out or feeling like you are going to pass out, or any other new or concerning symptoms.

## 2016-04-13 NOTE — ED Triage Notes (Signed)
Patient c/o right lower leg pain since yesterday. Reports she was sent from orthopedic UC to r/o DVT. Redness and swelling noted. Ambulatory to triage.

## 2016-04-14 ENCOUNTER — Ambulatory Visit (HOSPITAL_COMMUNITY)
Admission: RE | Admit: 2016-04-14 | Discharge: 2016-04-14 | Disposition: A | Discharge status: Home / Self Care | Payer: Medicare Other | Source: Ambulatory Visit | Attending: Emergency Medicine | Admitting: Emergency Medicine

## 2016-04-14 DIAGNOSIS — M7989 Other specified soft tissue disorders: Secondary | ICD-10-CM | POA: Insufficient documentation

## 2016-04-14 DIAGNOSIS — M79609 Pain in unspecified limb: Secondary | ICD-10-CM | POA: Diagnosis not present

## 2016-04-14 DIAGNOSIS — M79662 Pain in left lower leg: Secondary | ICD-10-CM | POA: Diagnosis not present

## 2016-04-14 NOTE — Progress Notes (Signed)
*  PRELIMINARY RESULTS* Vascular Ultrasound Left lower extremity venous duplex has been completed.  Preliminary findings: No evidence of deep or superficial vein thrombosis in the visualized veins of the left lower extremity.   Myrtie Cruise Megan Presti 04/14/2016, 9:00 AM

## 2016-06-19 DIAGNOSIS — H2513 Age-related nuclear cataract, bilateral: Secondary | ICD-10-CM | POA: Diagnosis not present

## 2016-06-19 DIAGNOSIS — H04123 Dry eye syndrome of bilateral lacrimal glands: Secondary | ICD-10-CM | POA: Diagnosis not present

## 2016-07-01 ENCOUNTER — Other Ambulatory Visit: Payer: Self-pay | Admitting: Internal Medicine

## 2016-07-28 NOTE — Progress Notes (Signed)
Chief Complaint  Patient presents with  . Follow-up    HPI: [name] 66 y.o. come in for Chronic disease management   Mom   Passed in march .  Coping  Had bladder cancer   gerdt he same protonix qd  ocass hoarseness  Ht  No checking readings seems to be ok   Stress   Husband cognition changing   rls  About the same   Takes care of grandkids    No depression otherwise \  Lipids on pravast had myalgias with atorva.  ROS: See pertinent positives and negatives per HPI. nocp sob  Had some dyuria  No hematuria   Past Medical History:  Diagnosis Date  . Acute pyelonephritis 05/02/2012  . Asthma   . Dyspnea    nl PFTs, and echo  . GERD (gastroesophageal reflux disease)   . Headache(784.0)   . Heart murmur   . History of kidney stones   . IBS (irritable bowel syndrome)   . Nephrolithiasis   . Pre-diabetes 12/15  . Recurrent oral ulcers   . Thyroid goiter    I 131 rx and then  total throidectomy 2005 [facility]    Family History  Problem Relation Age of Onset  . COPD Sister   . Coronary artery disease Sister        age 30 also copd  . Cancer Mother        bladder  . Arthritis Other   . Cancer Other        breast  . Diabetes Other   . Hyperlipidemia Other   . Hypertension Other   . Stroke Other   . Heart disease Other   . Coronary artery disease Father        also had AAA  . Aneurysm Father        Aortic and thoracic fatal    Social History   Social History  . Marital status: Married    Spouse name: N/A  . Number of children: N/A  . Years of education: N/A   Social History Main Topics  . Smoking status: Never Smoker  . Smokeless tobacco: Never Used  . Alcohol use Yes     Comment: seldom  . Drug use: No  . Sexual activity: Not Asked   Other Topics Concern  . 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 acetaminophen (TYLENOL) 500 MG tablet Take 1,000 mg by mouth every 6 (six) hours as needed for moderate pain.     . pantoprazole (PROTONIX) 40 MG tablet TAKE 1 TABLET (40 MG TOTAL) BY MOUTH 2 (TWO) TIMES DAILY. 180 tablet 1  . rOPINIRole (REQUIP) 0.5 MG tablet TAKE 1 TO 2 TABLETS BY MOUTH AT NOON, AND 5PM AND 3 TABLETS AT BEDTIME 540 tablet 1  . SYNTHROID 88 MCG tablet TAKE 1 TABLET BY MOUTH ONCE DAILY 90 tablet 3  . valsartan (DIOVAN) 80 MG tablet TAKE 1 TABLET (80 MG TOTAL) BY MOUTH EVERY MORNING. 90 tablet 0  . pravastatin (PRAVACHOL) 40 MG tablet TAKE 1 TABLET (40 MG TOTAL) BY MOUTH EVERY EVENING. 90 tablet 0   No facility-administered medications prior to visit.      EXAM:  BP 126/80 (BP Location:  Left Arm)   Temp 98 F (36.7 C) (Oral)   Wt 175 lb (79.4 kg)   BMI 35.35 kg/m   Body mass index is 35.35 kg/m. Repeat bp    GENERAL: vitals reviewed and listed above, alert, oriented, appears well hydrated and in no acute distress HEENT: atraumatic, conjunctiva  clear, no obvious abnormalities on inspection of external nose and ears OP : no lesion edema or exudate  NECK: no obvious masses on inspection palpation  LUNGS: clear to auscultation bilaterally, no wheezes, rales or rhonchi, good air movement CV: HRRR, no clubbing cyanosis or  peripheral edema nl cap refill  Abdomen:  Sof,t normal bowel sounds without hepatosplenomegaly, no guarding rebound or masses no CVA tenderness MS: moves all extremities without noticeable focal  abnormality PSYCH: pleasant and cooperative, no obvious depression or anxiety Lab Results  Component Value Date   WBC 6.0 01/28/2016   HGB 14.1 01/28/2016   HCT 40.6 01/28/2016   PLT 253.0 01/28/2016   GLUCOSE 96 01/28/2016   CHOL 227 (H) 01/28/2016   TRIG 209.0 (H) 01/28/2016   HDL 51.80 01/28/2016   LDLDIRECT 142.0 01/28/2016   LDLCALC 147 (H) 01/18/2015   ALT 37 (H) 01/28/2016   AST 22 01/28/2016   NA 139  01/28/2016   K 4.4 01/28/2016   CL 103 01/28/2016   CREATININE 0.83 01/28/2016   BUN 23 01/28/2016   CO2 29 01/28/2016   TSH 4.46 01/28/2016   INR 0.97 10/27/2010   HGBA1C 6.2 01/28/2016   BP Readings from Last 3 Encounters:  07/31/16 126/80  04/13/16 (!) 149/77  01/28/16 138/78   Wt Readings from Last 3 Encounters:  07/31/16 175 lb (79.4 kg)  04/13/16 170 lb (77.1 kg)  01/28/16 170 lb (77.1 kg)    ASSESSMENT AND PLAN:  Discussed the following assessment and plan:  Hyperlipidemia, unspecified hyperlipidemia type - intensify rx  tral crestor instead of prava lab in 3 mos  cpx pv in 6 mos or as needed  - Plan: Lipid panel, Comprehensive metabolic panel  Medication management - Plan: Lipid panel, Comprehensive metabolic panel  Essential hypertension - Plan: Lipid panel, Comprehensive metabolic panel  Fasting hyperglycemia - Plan: Lipid panel, Comprehensive metabolic panel  Restless legs syndrome (RLS)  Osteopenia, unspecified location - Plan: HM DEXA SCAN  Estrogen deficiency - Plan: HM DEXA SCAN  Dysuria - nl ua - Plan: POCT Urinalysis Dipstick (Automated) 6 mos preventive   Etc  -Patient advised to return or notify health care team  if  new concerns arise.  Patient Instructions  blood pressure is good today .   Change cholesterol medicine  To crestor generic  To try to get lipids better .   Plan lab in 2-3 months     NO OV is doing ok.  Lipid lfts    Yearly visit check up in 6 months .    Standley Brooking. Zoanne Newill M.D.

## 2016-07-31 ENCOUNTER — Encounter: Payer: Self-pay | Admitting: Internal Medicine

## 2016-07-31 ENCOUNTER — Ambulatory Visit (INDEPENDENT_AMBULATORY_CARE_PROVIDER_SITE_OTHER): Payer: Medicare Other | Admitting: Internal Medicine

## 2016-07-31 VITALS — BP 126/80 | Temp 98.0°F | Wt 175.0 lb

## 2016-07-31 DIAGNOSIS — E2839 Other primary ovarian failure: Secondary | ICD-10-CM | POA: Diagnosis not present

## 2016-07-31 DIAGNOSIS — I10 Essential (primary) hypertension: Secondary | ICD-10-CM

## 2016-07-31 DIAGNOSIS — M8580 Other specified disorders of bone density and structure, unspecified site: Secondary | ICD-10-CM

## 2016-07-31 DIAGNOSIS — G2581 Restless legs syndrome: Secondary | ICD-10-CM

## 2016-07-31 DIAGNOSIS — E785 Hyperlipidemia, unspecified: Secondary | ICD-10-CM

## 2016-07-31 DIAGNOSIS — R300 Dysuria: Secondary | ICD-10-CM

## 2016-07-31 DIAGNOSIS — R7301 Impaired fasting glucose: Secondary | ICD-10-CM

## 2016-07-31 DIAGNOSIS — Z79899 Other long term (current) drug therapy: Secondary | ICD-10-CM | POA: Diagnosis not present

## 2016-07-31 LAB — POC URINALSYSI DIPSTICK (AUTOMATED)
Bilirubin, UA: NEGATIVE
Glucose, UA: NEGATIVE
KETONES UA: NEGATIVE
Leukocytes, UA: NEGATIVE
Nitrite, UA: NEGATIVE
PROTEIN UA: NEGATIVE
RBC UA: NEGATIVE
Urobilinogen, UA: 0.2 E.U./dL
pH, UA: 6 (ref 5.0–8.0)

## 2016-07-31 MED ORDER — ROSUVASTATIN CALCIUM 10 MG PO TABS: 10.0000 mg | ORAL_TABLET | Freq: Every day | ORAL | 3 refills | Status: DC

## 2016-07-31 NOTE — Patient Instructions (Signed)
blood pressure is good today .   Change cholesterol medicine  To crestor generic  To try to get lipids better .   Plan lab in 2-3 months     NO OV is doing ok.  Lipid lfts    Yearly visit check up in 6 months .

## 2016-09-11 ENCOUNTER — Telehealth: Payer: Self-pay | Admitting: Internal Medicine

## 2016-09-11 NOTE — Telephone Encounter (Signed)
° ° °  Pt contacted the pharmacy and the below med was recall and is asking for a new med .    valsartan (DIOVAN) 80 MG tablet

## 2016-09-29 ENCOUNTER — Telehealth: Payer: Self-pay | Admitting: Internal Medicine

## 2016-09-29 MED ORDER — TELMISARTAN 40 MG PO TABS: 40.0000 mg | ORAL_TABLET | Freq: Every day | ORAL | 3 refills | Status: DC

## 2016-09-29 NOTE — Telephone Encounter (Signed)
Dr. Panosh - Please advise. Thanks! 

## 2016-09-29 NOTE — Telephone Encounter (Signed)
Rx for Telmisartan sent to pharmacy.   LMTCB for pt to advise.

## 2016-09-29 NOTE — Telephone Encounter (Signed)
Can change ot either  telmisartan 40 mg per day  Disp 30 refill x 3  OR   Candesartan 16 mg per day .disp 30 refill x 3 depending on  What is covered on her formulary .

## 2016-09-29 NOTE — Telephone Encounter (Signed)
Pt would like to know what she needs to switch to from her valsartan (DIOVAN) 80 MG tablet  that has been recalled.  [id number] - [place], [place] - [address] AT [address]

## 2016-10-04 ENCOUNTER — Other Ambulatory Visit: Payer: Self-pay | Admitting: Internal Medicine

## 2016-10-05 ENCOUNTER — Telehealth: Payer: Self-pay | Admitting: Internal Medicine

## 2016-10-05 NOTE — Telephone Encounter (Signed)
Received PA request for Telmisartan. PA submitted & pending. Key: [id number]

## 2016-10-06 ENCOUNTER — Other Ambulatory Visit: Payer: Self-pay | Admitting: Emergency Medicine

## 2016-10-06 MED ORDER — OLMESARTAN MEDOXOMIL 20 MG PO TABS: 20.0000 mg | ORAL_TABLET | Freq: Every day | ORAL | 1 refills | Status: DC

## 2016-10-06 NOTE — Telephone Encounter (Signed)
PA denied. The alternatives are:  Irbesartan Losartan olmesartan Lourdes Sledge

## 2016-10-06 NOTE — Telephone Encounter (Signed)
Medication has been sent to the pharmacy. 

## 2016-10-06 NOTE — Telephone Encounter (Signed)
Try olmesartan 20 mg 1 po qd disp  90 refill x 1

## 2016-10-06 NOTE — Telephone Encounter (Signed)
FYI

## 2016-10-08 ENCOUNTER — Encounter: Payer: Self-pay | Admitting: Internal Medicine

## 2016-10-16 ENCOUNTER — Encounter: Payer: Self-pay | Admitting: Internal Medicine

## 2016-10-18 ENCOUNTER — Emergency Department (HOSPITAL_COMMUNITY): Payer: Medicare Other

## 2016-10-18 ENCOUNTER — Emergency Department (HOSPITAL_COMMUNITY)
Admission: EM | Admit: 2016-10-18 | Discharge: 2016-10-18 | Disposition: A | Discharge status: Home / Self Care | Payer: Medicare Other | Attending: Emergency Medicine | Admitting: Emergency Medicine

## 2016-10-18 ENCOUNTER — Encounter (HOSPITAL_COMMUNITY): Payer: Self-pay | Admitting: Emergency Medicine

## 2016-10-18 DIAGNOSIS — R197 Diarrhea, unspecified: Secondary | ICD-10-CM | POA: Diagnosis not present

## 2016-10-18 DIAGNOSIS — R7303 Prediabetes: Secondary | ICD-10-CM | POA: Insufficient documentation

## 2016-10-18 DIAGNOSIS — R1033 Periumbilical pain: Secondary | ICD-10-CM | POA: Diagnosis not present

## 2016-10-18 DIAGNOSIS — R1011 Right upper quadrant pain: Secondary | ICD-10-CM | POA: Diagnosis not present

## 2016-10-18 DIAGNOSIS — Z87442 Personal history of urinary calculi: Secondary | ICD-10-CM | POA: Diagnosis not present

## 2016-10-18 DIAGNOSIS — M25519 Pain in unspecified shoulder: Secondary | ICD-10-CM | POA: Diagnosis not present

## 2016-10-18 DIAGNOSIS — Z79899 Other long term (current) drug therapy: Secondary | ICD-10-CM | POA: Diagnosis not present

## 2016-10-18 DIAGNOSIS — J45909 Unspecified asthma, uncomplicated: Secondary | ICD-10-CM | POA: Insufficient documentation

## 2016-10-18 DIAGNOSIS — R109 Unspecified abdominal pain: Secondary | ICD-10-CM

## 2016-10-18 DIAGNOSIS — R112 Nausea with vomiting, unspecified: Secondary | ICD-10-CM | POA: Diagnosis not present

## 2016-10-18 DIAGNOSIS — R1032 Left lower quadrant pain: Secondary | ICD-10-CM | POA: Diagnosis not present

## 2016-10-18 DIAGNOSIS — I10 Essential (primary) hypertension: Secondary | ICD-10-CM | POA: Diagnosis not present

## 2016-10-18 LAB — POCT I-STAT TROPONIN I: Troponin i, poc: 0 ng/mL (ref 0.00–0.08)

## 2016-10-18 LAB — CBC
HEMATOCRIT: 43.1 % (ref 36.0–46.0)
Hemoglobin: 15 g/dL (ref 12.0–15.0)
MCH: 33.4 pg (ref 26.0–34.0)
MCHC: 34.8 g/dL (ref 30.0–36.0)
MCV: 96 fL (ref 78.0–100.0)
PLATELETS: 231 10*3/uL (ref 150–400)
RBC: 4.49 MIL/uL (ref 3.87–5.11)
RDW: 13 % (ref 11.5–15.5)
WBC: 5.5 10*3/uL (ref 4.0–10.5)

## 2016-10-18 LAB — COMPREHENSIVE METABOLIC PANEL
ALBUMIN: 4.6 g/dL (ref 3.5–5.0)
ALT: 68 U/L — AB (ref 14–54)
AST: 58 U/L — AB (ref 15–41)
Alkaline Phosphatase: 50 U/L (ref 38–126)
Anion gap: 8 (ref 5–15)
BUN: 14 mg/dL (ref 6–20)
CHLORIDE: 103 mmol/L (ref 101–111)
CO2: 27 mmol/L (ref 22–32)
CREATININE: 0.88 mg/dL (ref 0.44–1.00)
Calcium: 9.4 mg/dL (ref 8.9–10.3)
GFR calc Af Amer: >60 mL/min (ref >60)
GLUCOSE: 102 mg/dL — AB (ref 65–99)
POTASSIUM: 4.1 mmol/L (ref 3.5–5.1)
Sodium: 138 mmol/L (ref 135–145)
Total Bilirubin: 2.1 mg/dL — ABNORMAL HIGH (ref 0.3–1.2)
Total Protein: 7.5 g/dL (ref 6.5–8.1)

## 2016-10-18 LAB — URINALYSIS, ROUTINE W REFLEX MICROSCOPIC
GLUCOSE, UA: NEGATIVE mg/dL
HGB URINE DIPSTICK: NEGATIVE
KETONES UR: NEGATIVE mg/dL
LEUKOCYTES UA: NEGATIVE
Nitrite: NEGATIVE
PH: 5.5 (ref 5.0–8.0)
PROTEIN: NEGATIVE mg/dL
Specific Gravity, Urine: >1.03 — ABNORMAL HIGH (ref 1.005–1.030)

## 2016-10-18 LAB — LIPASE, BLOOD: LIPASE: 22 U/L (ref 11–51)

## 2016-10-18 MED ORDER — KETOROLAC TROMETHAMINE 15 MG/ML IJ SOLN
15.0000 mg | Freq: Once | INTRAMUSCULAR | Status: AC
  Administered 2016-10-18: 15 mg via INTRAVENOUS
  Filled 2016-10-18: qty 1

## 2016-10-18 MED ORDER — ONDANSETRON HCL 4 MG/2ML IJ SOLN
4.0000 mg | Freq: Once | INTRAMUSCULAR | Status: AC
  Administered 2016-10-18: 4 mg via INTRAVENOUS
  Filled 2016-10-18: qty 2

## 2016-10-18 MED ORDER — ONDANSETRON 4 MG PO TBDP: 4.0000 mg | ORAL_TABLET | Freq: Three times a day (TID) | ORAL | 0 refills | Status: DC | PRN

## 2016-10-18 MED ORDER — SODIUM CHLORIDE 0.9 % IV BOLUS (SEPSIS)
1000.0000 mL | Freq: Once | INTRAVENOUS | Status: AC
  Administered 2016-10-18: 1000 mL via INTRAVENOUS

## 2016-10-18 MED ORDER — MORPHINE SULFATE (PF) 4 MG/ML IV SOLN
4.0000 mg | Freq: Once | INTRAVENOUS | Status: AC
  Administered 2016-10-18: 4 mg via INTRAVENOUS
  Filled 2016-10-18: qty 1

## 2016-10-18 NOTE — ED Triage Notes (Signed)
Pt reports abd pain, diarrhea, shoulder pain, and indigestion since yesterday.

## 2016-10-18 NOTE — ED Notes (Signed)
PA at bedside.

## 2016-10-18 NOTE — ED Notes (Signed)
US at bedside

## 2016-10-18 NOTE — ED Notes (Signed)
Bed: WA01 Expected date:  Expected time:  Means of arrival:  Comments: 

## 2016-10-18 NOTE — Discharge Instructions (Signed)
Use Zofran as needed for nausea or vomiting. This is a tablet disintegrates under your tongue. Use Tylenol as needed for fever or pain. Try to avoid ibuprofen, as this can cause further stomach upset. It is very important that you continue to take your reflux medication, as your stomach will be more irritated over the next several days. It is important that you stay well-hydrated, drink plenty of water. Try to eat small, bland meals over the next several days. Do not eat spicy, fatty, or dairy foods until symptoms completely resolve. Return to the emergency room if you have vomiting that is persistent despite medication, ave worsening pain, or any new or worsening symptoms.

## 2016-10-18 NOTE — ED Provider Notes (Signed)
[facility] DEPT Provider Note   CSN: [id number] Arrival date & time: 10/18/16  0725     History   Chief Complaint Chief Complaint  Patient presents with  . Abdominal Pain    HPI [name] is a 66 y.o. female presenting with abdominal pain, diarrhea, and vomiting.  Patient states that over the past several weeks, she's had mild, self-limited episodes of abdominal pain. She reports worsening indigestion. Yesterday, she developed persistent abdominal pain, with frequent episodes of diarrhea and vomiting. She reports the pain is of her central abdomen, radiates to her back and shoulders. This began about 2 hours after eating peanut butter crackers. She has a history of indigestion, for which she has been taking Protonix twice a day as prescribed. She reports low-grade fever last night, 99.1 on arrival. She denies chest pain, shortness of breath, or cough. She denies blood in her stool or vomitus. She reports one episode of dysuria yesterday but was unsure if this was due to her diarrhea. She has not taken anything for pain. Nothing has made it better or worse. Patient said she's had nothing to eat since yesterday afternoon. She has had a previous appendectomy. She is not on blood thinners. No one else at home is sick. She denies recent travel or abnormal foods. Patient's mom and daughter have had needed to have their gallbladders removed in the past several months.   HPI  Past Medical History:  Diagnosis Date  . Acute pyelonephritis 05/02/2012  . Asthma   . Dyspnea    nl PFTs, and echo  . GERD (gastroesophageal reflux disease)   . Headache(784.0)   . Heart murmur   . History of kidney stones   . IBS (irritable bowel syndrome)   . Nephrolithiasis   . Pre-diabetes 12/15  . Recurrent oral ulcers   . Thyroid goiter    I 131 rx and then  total throidectomy 2005 [facility]    Patient Active Problem List   Diagnosis Date Noted  . Essential hypertension 08/17/2014  . Unspecified  essential hypertension 08/01/2013  . Mild concussion 07/18/2013  . Head injury, unspecified 07/18/2013  . Back strain 07/18/2013  . Fall 07/18/2013  . Stress 06/26/2013  . Upper airway cough syndrome 06/15/2013  . Murmur 02/28/2013  . Chest discomfort 01/24/2013  . Dyspnea 01/24/2013  . Elevated blood pressure reading 01/24/2013  . Stressful life event affecting family 01/24/2013  . Decreased exercise tolerance 01/24/2013  . Pre-diabetes 04/29/2012  . Abdominal swelling, left lower quadrant 04/29/2012  . Tingling in extremities 01/27/2012  . Cough, persistent 04/18/2011  . Sleep disturbance 11/23/2010  . Family history of premature coronary heart disease 11/23/2010  . Leg pain 11/17/2010  . Osteopenia 04/25/2010  . CATARACT, LEFT EYE 11/27/2009  . PURE HYPERCHOLESTEROLEMIA 01/29/2009  . OBESITY 01/12/2008  . DYSPNEA 01/12/2008  . ABDOMINAL ULTRASOUND, ABNORMAL 12/26/2007  . HYPERGLYCEMIA 12/14/2007  . LIVER FUNCTION TESTS, ABNORMAL 12/14/2007  . NEPHROLITHIASIS, HX OF 11/06/2007  . VITAMIN D DEFICIENCY 11/25/2006  . HYPOTHYROIDISM 10/04/2006  . HYPERLIPIDEMIA NEC/NOS 10/04/2006  . SYNDROME, RESTLESS LEGS 10/04/2006  . DISORDER, BONE/CARTILAGE NOS 10/04/2006  . WEIGHT GAIN 10/04/2006  . ELEVATION, TRANSAMINASE/LDH LEVELS 10/04/2006  . ASTHMA 09/30/2006  . GERD 09/30/2006    Past Surgical History:  Procedure Laterality Date  . ABDOMINAL HYSTERECTOMY  1994   fibroid  . APPENDECTOMY  1968  . orif left tivial plateau fracture     Dr. Collier Salina 2/08  . plates and pins take out of left  knee  06/2008  . TONSILLECTOMY  1979  . TOTAL THYROIDECTOMY  2005   for  large nodule 2006    OB History    Gravida Para Term Preterm AB Living   4 4           SAB TAB Ectopic Multiple Live Births                   Home Medications    Prior to Admission medications   Medication Sig Start Date End Date Taking? Authorizing Provider  acetaminophen (TYLENOL) 500 MG tablet Take  1,000 mg by mouth 3 (three) times daily.    Yes [provider]  olmesartan (BENICAR) 20 MG tablet Take 1 tablet (20 mg total) by mouth daily. 10/06/16  Yes Panosh, Standley Brooking, MD  pantoprazole (PROTONIX) 40 MG tablet TAKE 1 TABLET (40 MG TOTAL) BY MOUTH 2 (TWO) TIMES DAILY. 02/06/16  Yes Panosh, Standley Brooking, MD  rOPINIRole (REQUIP) 0.5 MG tablet TAKE 1 TO 2 TABLETS BY MOUTH AT NOON, AND 5PM AND 3 TABLETS AT BEDTIME 10/07/16  Yes Panosh, Standley Brooking, MD  rosuvastatin (CRESTOR) 10 MG tablet Take 1 tablet (10 mg total) by mouth daily. 07/31/16  Yes Panosh, Standley Brooking, MD  SYNTHROID 88 MCG tablet TAKE 1 TABLET BY MOUTH ONCE DAILY 01/28/16  Yes Panosh, Standley Brooking, MD  ondansetron (ZOFRAN ODT) 4 MG disintegrating tablet Take 1 tablet (4 mg total) by mouth every 8 (eight) hours as needed for nausea or vomiting. 10/18/16   Tage Feggins, PA-C    Family History Family History  Problem Relation Age of Onset  . COPD Sister   . Coronary artery disease Sister        age 82 also copd  . Cancer Mother        bladder  . Arthritis Other   . Cancer Other        breast  . Diabetes Other   . Hyperlipidemia Other   . Hypertension Other   . Stroke Other   . Heart disease Other   . Coronary artery disease Father        also had AAA  . Aneurysm Father        Aortic and thoracic fatal    Social History Social History  Substance Use Topics  . Smoking status: Never Smoker  . Smokeless tobacco: Never Used  . Alcohol use Yes     Comment: seldom     Allergies   Ciprofloxacin; Propofol; and Ritalin [methylphenidate hcl]   Review of Systems Review of Systems  Constitutional: Positive for fever (subjective). Negative for chills.  HENT: Negative for congestion and sore throat.   Respiratory: Negative for cough, chest tightness and shortness of breath.   Cardiovascular: Negative for chest pain.  Gastrointestinal: Positive for abdominal pain, diarrhea, nausea and vomiting. Negative for abdominal distention and  blood in stool.  Genitourinary: Negative for dysuria, frequency and hematuria.  Musculoskeletal: Positive for arthralgias (abdominal pain referred to the back and bilateral shoulders).  Skin: Negative for wound.  Allergic/Immunologic: Negative for immunocompromised state.  Neurological: Negative for dizziness and light-headedness.  Hematological: Does not bruise/bleed easily.  Psychiatric/Behavioral: Negative for agitation and confusion.     Physical Exam Updated Vital Signs BP 111/86   Pulse 81   Temp 99.1 F (37.3 C) (Oral)   Resp 16   SpO2 95%   Physical Exam  Constitutional: She is oriented to person, place, and time. She appears well-developed and well-nourished. No  distress.  HENT:  Head: Normocephalic and atraumatic.  Eyes: EOM are normal.  Neck: Normal range of motion.  Cardiovascular: Normal rate, regular rhythm and intact distal pulses.   Pulmonary/Chest: Effort normal and breath sounds normal. No respiratory distress. She has no decreased breath sounds. She has no wheezes. She has no rhonchi. She has no rales.  Abdominal: Soft. Normal appearance and bowel sounds are normal. She exhibits no distension. There is tenderness in the right upper quadrant and left lower quadrant. There is no rigidity, no rebound, no guarding, no tenderness at McBurney's point and negative Murphy's sign.  Minimal tenderness to palpation of right upper quadrant and left lower quadrant. No rebound. No rigidity or distention.   Musculoskeletal: Normal range of motion.  Neurological: She is alert and oriented to person, place, and time.  Skin: Skin is warm and dry.  Psychiatric: She has a normal mood and affect.  Nursing note and vitals reviewed.    ED Treatments / Results  Labs (all labs ordered are listed, but only abnormal results are displayed) Labs Reviewed  COMPREHENSIVE METABOLIC PANEL - Abnormal; Notable for the following:       Result Value   Glucose, Bld 102 (*)    AST 58 (*)     ALT 68 (*)    Total Bilirubin 2.1 (*)    All other components within normal limits  URINALYSIS, ROUTINE W REFLEX MICROSCOPIC - Abnormal; Notable for the following:    Specific Gravity, Urine >1.030 (*)    Bilirubin Urine SMALL (*)    All other components within normal limits  LIPASE, BLOOD  CBC  I-STAT TROPONIN, ED  POCT I-STAT TROPONIN I    EKG  EKG Interpretation None       Radiology Dg Chest 2 View  Result Date: 10/18/2016 CLINICAL DATA:  Shoulder pain and indigestion EXAM: CHEST  2 VIEW COMPARISON:  01/30/2014 FINDINGS: The heart size and mediastinal contours are within normal limits. Both lungs are clear. The visualized skeletal structures are unremarkable. IMPRESSION: No active cardiopulmonary disease. Electronically Signed   By: Inez Catalina M.D.   On: 10/18/2016 08:53   US Abdomen Limited Ruq  Result Date: 10/18/2016 CLINICAL DATA:  66 year old female with right upper quadrant abdominal pain. EXAM: ULTRASOUND ABDOMEN LIMITED RIGHT UPPER QUADRANT COMPARISON:  Abdomen ultrasound 03/18/2015. CT Abdomen and Pelvis 05/10/2012. FINDINGS: Gallbladder: No gallstones or wall thickening visualized. No sonographic Murphy sign noted by sonographer. Common bile duct: Diameter: 4 mm, normal Liver: Chronic increased hepatic echogenicity (image 32). Somewhat coarse hepatic echotexture. No discrete liver lesion identified. Portal vein is patent on color Doppler imaging with normal direction of blood flow towards the liver (Image 33). Other findings: Negative visible right kidney. IMPRESSION: 1. Negative gallbladder.  No acute findings. 2. Chronic fatty liver disease. Electronically Signed   By: Genevie Ann M.D.   On: 10/18/2016 13:22    Procedures Procedures (including critical care time)  Medications Ordered in ED Medications  sodium chloride 0.9 % bolus 1,000 mL (0 mLs Intravenous Stopped 10/18/16 1410)  ondansetron (ZOFRAN) injection 4 mg (4 mg Intravenous Given 10/18/16 1248)  morphine 4  MG/ML injection 4 mg (4 mg Intravenous Given 10/18/16 1248)  ketorolac (TORADOL) 15 MG/ML injection 15 mg (15 mg Intravenous Given 10/18/16 1411)     Initial Impression / Assessment and Plan / ED Course  I have reviewed the triage vital signs and the nursing notes.  Pertinent labs & imaging results that were available during my care of  the patient were reviewed by me and considered in my medical decision making (see chart for details).     Patient presenting with acute onset abdominal pain, nausea, and diarrhea. Patient initially tachycardic at 117. Afebrile at 99.1. On reassessment, patient heart rate improved. Physical exam shows patient with tenderness to palpation of left lower quadrant and right upper quadrant. No rebound, rigidity, or distention. Negative Murphy's. Patient without appendix. CMP shows stable electrolytes, normal kidney function. Slightly elevated AST and MALT, and bili 2.1. CBC shows no leukocytosis. UA negative for infection. Lipase negative. Will order right upper quadrant ultrasound to rule out cholelithiasis. Will start IV fluids, Zofran for nausea, and morphine for pain. Discussed case with attending, Dr. Alvino Chapel evaluated the patient.  Ultrasound negative for cholelithiasis or gallbladder wall thickening. Patient reports nausea improved, and abdominal pain improved. Complaining of headache. Will give ketorolac for headache. Will try PO challenge and reassess.  On reassessment, patient states headache is improved. Tolerated PO intake without difficulty. At this time, doubt intra-abdominal infection. Doubt perforation or obstruction. Abdominal pain will likely resolve with symptomatic treatment. Discussed findings with patient. Discussed symptomatic treatment. At this time, patient appears safe for discharge. Return precautions given. Patient states she understands and agrees to plan.   Final Clinical Impressions(s) / ED Diagnoses   Final diagnoses:  Acute abdominal  pain  Nausea vomiting and diarrhea    New Prescriptions Discharge Medication List as of 10/18/2016  3:06 PM    START taking these medications   Details  ondansetron (ZOFRAN ODT) 4 MG disintegrating tablet Take 1 tablet (4 mg total) by mouth every 8 (eight) hours as needed for nausea or vomiting., Starting Sun 10/18/2016, Print         Arzu Mcgaughey, PA-C 10/18/16 1649    Davonna Belling, MD 10/20/16 0009

## 2016-10-30 ENCOUNTER — Other Ambulatory Visit (INDEPENDENT_AMBULATORY_CARE_PROVIDER_SITE_OTHER): Payer: Medicare Other

## 2016-10-30 DIAGNOSIS — E785 Hyperlipidemia, unspecified: Secondary | ICD-10-CM | POA: Diagnosis not present

## 2016-10-30 DIAGNOSIS — Z79899 Other long term (current) drug therapy: Secondary | ICD-10-CM | POA: Diagnosis not present

## 2016-10-30 DIAGNOSIS — I10 Essential (primary) hypertension: Secondary | ICD-10-CM

## 2016-10-30 DIAGNOSIS — R7301 Impaired fasting glucose: Secondary | ICD-10-CM

## 2016-10-30 LAB — COMPREHENSIVE METABOLIC PANEL
ALK PHOS: 46 U/L (ref 39–117)
ALT: 55 U/L — AB (ref 0–35)
AST: 31 U/L (ref 0–37)
Albumin: 4.3 g/dL (ref 3.5–5.2)
BUN: 14 mg/dL (ref 6–23)
CO2: 28 meq/L (ref 19–32)
Calcium: 9 mg/dL (ref 8.4–10.5)
Chloride: 103 mEq/L (ref 96–112)
Creatinine, Ser: 0.75 mg/dL (ref 0.40–1.20)
GFR: 82.1 mL/min (ref >60.00)
GLUCOSE: 117 mg/dL — AB (ref 70–99)
POTASSIUM: 4 meq/L (ref 3.5–5.1)
SODIUM: 140 meq/L (ref 135–145)
TOTAL PROTEIN: 6.4 g/dL (ref 6.0–8.3)
Total Bilirubin: 0.9 mg/dL (ref 0.2–1.2)

## 2016-10-30 LAB — LIPID PANEL
Cholesterol: 121 mg/dL (ref 0–200)
HDL: 45.4 mg/dL (ref >39.00)
LDL Cholesterol: 44 mg/dL (ref 0–99)
NONHDL: 75.53
Total CHOL/HDL Ratio: 3
Triglycerides: 157 mg/dL — ABNORMAL HIGH (ref 0.0–149.0)
VLDL: 31.4 mg/dL (ref 0.0–40.0)

## 2016-11-28 ENCOUNTER — Other Ambulatory Visit: Payer: Self-pay | Admitting: Internal Medicine

## 2016-12-24 DIAGNOSIS — S8991XA Unspecified injury of right lower leg, initial encounter: Secondary | ICD-10-CM | POA: Diagnosis not present

## 2016-12-24 DIAGNOSIS — Z79899 Other long term (current) drug therapy: Secondary | ICD-10-CM | POA: Diagnosis not present

## 2016-12-24 DIAGNOSIS — M25561 Pain in right knee: Secondary | ICD-10-CM | POA: Diagnosis not present

## 2016-12-24 DIAGNOSIS — M7989 Other specified soft tissue disorders: Secondary | ICD-10-CM | POA: Diagnosis not present

## 2016-12-24 DIAGNOSIS — X58XXXA Exposure to other specified factors, initial encounter: Secondary | ICD-10-CM | POA: Diagnosis not present

## 2016-12-24 DIAGNOSIS — I10 Essential (primary) hypertension: Secondary | ICD-10-CM | POA: Diagnosis not present

## 2016-12-24 DIAGNOSIS — S8981XA Other specified injuries of right lower leg, initial encounter: Secondary | ICD-10-CM | POA: Diagnosis not present

## 2016-12-25 DIAGNOSIS — S83211A Bucket-handle tear of medial meniscus, current injury, right knee, initial encounter: Secondary | ICD-10-CM | POA: Diagnosis not present

## 2016-12-26 DIAGNOSIS — M2241 Chondromalacia patellae, right knee: Secondary | ICD-10-CM | POA: Diagnosis not present

## 2016-12-26 DIAGNOSIS — S83211A Bucket-handle tear of medial meniscus, current injury, right knee, initial encounter: Secondary | ICD-10-CM | POA: Diagnosis not present

## 2016-12-26 DIAGNOSIS — S83231A Complex tear of medial meniscus, current injury, right knee, initial encounter: Secondary | ICD-10-CM | POA: Diagnosis not present

## 2016-12-26 DIAGNOSIS — M948X6 Other specified disorders of cartilage, lower leg: Secondary | ICD-10-CM | POA: Diagnosis not present

## 2017-01-13 DIAGNOSIS — S83241A Other tear of medial meniscus, current injury, right knee, initial encounter: Secondary | ICD-10-CM | POA: Diagnosis not present

## 2017-01-13 DIAGNOSIS — M23221 Derangement of posterior horn of medial meniscus due to old tear or injury, right knee: Secondary | ICD-10-CM | POA: Diagnosis not present

## 2017-01-13 DIAGNOSIS — S83211D Bucket-handle tear of medial meniscus, current injury, right knee, subsequent encounter: Secondary | ICD-10-CM | POA: Diagnosis not present

## 2017-01-15 DIAGNOSIS — M25561 Pain in right knee: Secondary | ICD-10-CM | POA: Diagnosis not present

## 2017-01-15 DIAGNOSIS — Z9889 Other specified postprocedural states: Secondary | ICD-10-CM | POA: Diagnosis not present

## 2017-01-15 DIAGNOSIS — R262 Difficulty in walking, not elsewhere classified: Secondary | ICD-10-CM | POA: Diagnosis not present

## 2017-01-15 DIAGNOSIS — R29898 Other symptoms and signs involving the musculoskeletal system: Secondary | ICD-10-CM | POA: Diagnosis not present

## 2017-01-15 DIAGNOSIS — M25661 Stiffness of right knee, not elsewhere classified: Secondary | ICD-10-CM | POA: Diagnosis not present

## 2017-01-15 DIAGNOSIS — M25461 Effusion, right knee: Secondary | ICD-10-CM | POA: Diagnosis not present

## 2017-01-21 DIAGNOSIS — M25461 Effusion, right knee: Secondary | ICD-10-CM | POA: Diagnosis not present

## 2017-01-21 DIAGNOSIS — M25661 Stiffness of right knee, not elsewhere classified: Secondary | ICD-10-CM | POA: Diagnosis not present

## 2017-01-21 DIAGNOSIS — Z9889 Other specified postprocedural states: Secondary | ICD-10-CM | POA: Diagnosis not present

## 2017-01-21 DIAGNOSIS — R29898 Other symptoms and signs involving the musculoskeletal system: Secondary | ICD-10-CM | POA: Diagnosis not present

## 2017-01-21 DIAGNOSIS — M25561 Pain in right knee: Secondary | ICD-10-CM | POA: Diagnosis not present

## 2017-01-21 DIAGNOSIS — R262 Difficulty in walking, not elsewhere classified: Secondary | ICD-10-CM | POA: Diagnosis not present

## 2017-01-25 ENCOUNTER — Ambulatory Visit (INDEPENDENT_AMBULATORY_CARE_PROVIDER_SITE_OTHER): Payer: Medicare Other | Admitting: Internal Medicine

## 2017-01-25 ENCOUNTER — Ambulatory Visit: Payer: Self-pay

## 2017-01-25 ENCOUNTER — Encounter: Payer: Self-pay | Admitting: Internal Medicine

## 2017-01-25 VITALS — BP 126/70 | HR 93 | Temp 98.9°F | Wt 165.4 lb

## 2017-01-25 DIAGNOSIS — I10 Essential (primary) hypertension: Secondary | ICD-10-CM | POA: Diagnosis not present

## 2017-01-25 DIAGNOSIS — K5792 Diverticulitis of intestine, part unspecified, without perforation or abscess without bleeding: Secondary | ICD-10-CM | POA: Diagnosis not present

## 2017-01-25 LAB — CBC WITH DIFFERENTIAL/PLATELET
Basophils Absolute: 0 10*3/uL (ref 0.0–0.1)
Basophils Relative: 0.4 % (ref 0.0–3.0)
EOS ABS: 0 10*3/uL (ref 0.0–0.7)
EOS PCT: 0.2 % (ref 0.0–5.0)
HEMATOCRIT: 41.3 % (ref 36.0–46.0)
HEMOGLOBIN: 13.9 g/dL (ref 12.0–15.0)
LYMPHS PCT: 19.2 % (ref 12.0–46.0)
Lymphs Abs: 1.9 10*3/uL (ref 0.7–4.0)
MCHC: 33.8 g/dL (ref 30.0–36.0)
MCV: 98.7 fl (ref 78.0–100.0)
MONO ABS: 0.9 10*3/uL (ref 0.1–1.0)
Monocytes Relative: 8.8 % (ref 3.0–12.0)
Neutro Abs: 7 10*3/uL (ref 1.4–7.7)
Neutrophils Relative %: 71.4 % (ref 43.0–77.0)
Platelets: 267 10*3/uL (ref 150.0–400.0)
RBC: 4.18 Mil/uL (ref 3.87–5.11)
RDW: 13 % (ref 11.5–15.5)
WBC: 9.7 10*3/uL (ref 4.0–10.5)

## 2017-01-25 MED ORDER — METRONIDAZOLE 500 MG PO TABS: 500.0000 mg | ORAL_TABLET | Freq: Three times a day (TID) | ORAL | 0 refills | Status: DC

## 2017-01-25 MED ORDER — CIPROFLOXACIN HCL 500 MG PO TABS: 500.0000 mg | ORAL_TABLET | Freq: Two times a day (BID) | ORAL | 0 refills | Status: DC

## 2017-01-25 NOTE — Telephone Encounter (Signed)
Appointment made for today

## 2017-01-25 NOTE — Telephone Encounter (Signed)
   Reason for Disposition . [1] MODERATE pain (e.g., interferes with normal activities) AND [2] pain comes and goes (cramps) AND [3] present > 24 hours  (Exception: pain with Vomiting or Diarrhea - see that Guideline)  Answer Assessment - Initial Assessment Questions 1. LOCATION: "Where does it hurt?"      Lower abdomen all the way across 2. RADIATION: "Does the pain shoot anywhere else?" (e.g., chest, back)     Hurts a little into the back 3. ONSET: "When did the pain begin?" (e.g., minutes, hours or days ago)      Started 3 days ago 4. SUDDEN: "Gradual or sudden onset?"     Gradual 5. PATTERN "Does the pain come and go, or is it constant?"    - If constant: "Is it getting better, staying the same, or worsening?"      (Note: Constant means the pain never goes away completely; most serious pain is constant and it progresses)     - If intermittent: "How long does it last?" "Do you have pain now?"     (Note: Intermittent means the pain goes away completely between bouts)     Constant 6. SEVERITY: "How bad is the pain?"  (e.g., Scale 1-10; mild, moderate, or severe)   - MILD (1-3): doesn't interfere with normal activities, abdomen soft and not tender to touch    - MODERATE (4-7): interferes with normal activities or awakens from sleep, tender to touch    - SEVERE (8-10): excruciating pain, doubled over, unable to do any normal activities      9 7. RECURRENT SYMPTOM: "Have you ever had this type of abdominal pain before?" If so, ask: "When was the last time?" and "What happened that time?"      Yes - viral 8. CAUSE: "What do you think is causing the abdominal pain?"     Maybe a bladder infection 9. RELIEVING/AGGRAVATING FACTORS: "What makes it better or worse?" (e.g., movement, antacids, bowel movement)     Hurts with movement and with voiding and BM 10. OTHER SYMPTOMS: "Has there been any vomiting, diarrhea, constipation, or urine problems?"       Last BM 11. PREGNANCY: "Is there any  chance you are pregnant?" "When was your last menstrual period?"       No  Protocols used: ABDOMINAL PAIN - [facility]

## 2017-01-25 NOTE — Progress Notes (Signed)
Subjective:    Patient ID: [name], female    DOB: 1950/10/24, 66 y.o.   MRN: [id number]  HPI  66 year old patient who has a history of moderate diverticulosis but no prior history of acute diverticulitis.  For the past 2 days she has had worsening lower abdominal pain associated with low-grade fever headache.  She is status post right knee arthroscopic surgery approximately 2 weeks ago.  Denies any nausea vomiting or change in her bowel habits.  Past Medical History:  Diagnosis Date  . Acute pyelonephritis 05/02/2012  . Asthma   . Dyspnea    nl PFTs, and echo  . GERD (gastroesophageal reflux disease)   . Headache(784.0)   . Heart murmur   . History of kidney stones   . IBS (irritable bowel syndrome)   . Nephrolithiasis   . Pre-diabetes 12/15  . Recurrent oral ulcers   . Thyroid goiter    I 131 rx and then  total throidectomy 2005 [facility]     Social History   Socioeconomic History  . Marital status: Married    Spouse name: Not on file  . Number of children: Not on file  . Years of education: Not on file  . Highest education level: Not on file  Social Needs  . Financial resource strain: Not on file  . Food insecurity - worry: Not on file  . Food insecurity - inability: Not on file  . Transportation needs - medical: Not on file  . Transportation needs - non-medical: Not on file  Occupational History  . Not on file  Tobacco Use  . Smoking status: Never Smoker  . Smokeless tobacco: Never Used  Substance and Sexual Activity  . Alcohol use: Yes    Comment: seldom  . Drug use: No  . Sexual activity: Not on file  Other Topics Concern  . 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 ABDOMINAL HYSTERECTOMY  1994   fibroid  . APPENDECTOMY  1968  . orif left tivial plateau fracture     Dr. Collier Salina  2/08  . plates and pins take out of left knee  06/2008  . TONSILLECTOMY  1979  . TOTAL THYROIDECTOMY  2005   for  large nodule 2006    Family History  Problem Relation Age of Onset  . COPD Sister   . Coronary artery disease Sister        age 68 also copd  . Cancer Mother        bladder  . Arthritis Other   . Cancer Other        breast  . Diabetes Other   . Hyperlipidemia Other   . Hypertension Other   . Stroke Other   . Heart disease Other   . Coronary artery disease Father        also had AAA  . Aneurysm Father        Aortic and thoracic fatal    Allergies  Allergen Reactions  . Ciprofloxacin Nausea Only  . Propofol Other (See Comments)    Agitated, combative  . Ritalin [Methylphenidate Hcl] Other (See Comments)    "climbed the walls"    Current Outpatient Medications on File Prior to Visit  Medication Sig Dispense  Refill  . acetaminophen (TYLENOL) 500 MG tablet Take 1,000 mg by mouth 3 (three) times daily.     Marland Kitchen olmesartan (BENICAR) 20 MG tablet Take 1 tablet (20 mg total) by mouth daily. 90 tablet 1  . ondansetron (ZOFRAN ODT) 4 MG disintegrating tablet Take 1 tablet (4 mg total) by mouth every 8 (eight) hours as needed for nausea or vomiting. 20 tablet 0  . pantoprazole (PROTONIX) 40 MG tablet TAKE 1 TABLET (40 MG TOTAL) BY MOUTH 2 (TWO) TIMES DAILY. 180 tablet 1  . rOPINIRole (REQUIP) 0.5 MG tablet TAKE 1 TO 2 TABLETS BY MOUTH AT NOON, AND 5PM AND 3 TABLETS AT BEDTIME 540 tablet 1  . rosuvastatin (CRESTOR) 10 MG tablet TAKE 1 TABLET BY MOUTH EVERY DAY 30 tablet 2  . SYNTHROID 88 MCG tablet TAKE 1 TABLET BY MOUTH ONCE DAILY 90 tablet 3   No current facility-administered medications on file prior to visit.     BP 126/70 (BP Location: Right Arm, Patient Position: Sitting, Cuff Size: Large)   Pulse 93   Temp 98.9 F (37.2 C) (Oral)   Wt 165 lb 6.4 oz (75 kg)   SpO2 97%   BMI 33.41 kg/m     Review of Systems  Constitutional: Positive for activity change,  appetite change, fatigue and fever. Negative for chills and diaphoresis.  HENT: Negative for congestion, dental problem, hearing loss, rhinorrhea, sinus pressure, sore throat and tinnitus.   Eyes: Negative for pain, discharge and visual disturbance.  Respiratory: Negative for cough and shortness of breath.   Cardiovascular: Negative for chest pain, palpitations and leg swelling.  Gastrointestinal: Positive for abdominal pain. Negative for abdominal distention, blood in stool, constipation, diarrhea, nausea and vomiting.  Genitourinary: Negative for difficulty urinating, dysuria, flank pain, frequency, hematuria, pelvic pain, urgency, vaginal bleeding, vaginal discharge and vaginal pain.  Musculoskeletal: Negative for arthralgias, gait problem and joint swelling.  Skin: Negative for rash.  Neurological: Negative for dizziness, syncope, speech difficulty, weakness, numbness and headaches.  Hematological: Negative for adenopathy.  Psychiatric/Behavioral: Negative for agitation, behavioral problems and dysphoric mood. The patient is not nervous/anxious.        Objective:   Physical Exam  Constitutional: She is oriented to person, place, and time. She appears well-developed and well-nourished. No distress.   Appears unwell but in no acute distress Afebrile No tachycardia  HENT:  Head: Normocephalic.  Right Ear: External ear normal.  Left Ear: External ear normal.  Mouth/Throat: Oropharynx is clear and moist.  Eyes: Conjunctivae and EOM are normal. Pupils are equal, round, and reactive to light.  Neck: Normal range of motion. Neck supple. No thyromegaly present.  Cardiovascular: Normal rate, regular rhythm and intact distal pulses.  Murmur heard. Grade 2/6 systolic murmur loudest at the base  Pulmonary/Chest: Effort normal and breath sounds normal.  Abdominal: Soft. She exhibits no mass. There is no tenderness.  Bowel sounds slightly decreased Tenderness in all lower quadrants maximal  left lower quadrant Mild guarding   Musculoskeletal: Normal range of motion.  Lymphadenopathy:    She has no cervical adenopathy.  Neurological: She is alert and oriented to person, place, and time.  Skin: Skin is warm and dry. No rash noted.  Psychiatric: She has a normal mood and affect. Her behavior is normal.          Assessment & Plan:   Acute diverticulitis.  Will check a CBC and placed on dual antibiotic therapy.  The patient denies any antibiotic allergies but chart suggest  nausea with Cipro. The patient has been asked to return in 4 days for follow-up She will report any worsening pain or fever.  Patient information concerning acute diverticulitis dispensed Essential hypertension stable   Will defer abdominal CT at this time since low likelihood for complications at this time  [facility]

## 2017-01-25 NOTE — Patient Instructions (Addendum)
Return in 4 days for follow-up  Report any worsening abdominal pain or fever  Take your antibiotic as prescribed until ALL of it is gone, but stop if you develop a rash, swelling, or any side effects of the medication.  Contact our office as soon as possible if  there are side effects of the medication.   Diverticulitis Diverticulitis is when small pockets in your large intestine (colon) get infected or swollen. This causes stomach pain and watery poop (diarrhea). These pouches are called diverticula. They form in people who have a condition called diverticulosis. Follow these instructions at home: Medicines  Take over-the-counter and prescription medicines only as told by your doctor. These include: ? Antibiotics. ? Pain medicines. ? Fiber pills. ? Probiotics. ? Stool softeners.  Do not drive or use heavy machinery while taking prescription pain medicine.  If you were prescribed an antibiotic, take it as told. Do not stop taking it even if you feel better. General instructions  Follow a diet as told by your doctor.  When you feel better, your doctor may tell you to change your diet. You may need to eat a lot of fiber. Fiber makes it easier to poop (have bowel movements). Healthy foods with fiber include: ? Berries. ? Beans. ? Lentils. ? Green vegetables.  Exercise 3 or more times a week. Aim for 30 minutes each time. Exercise enough to sweat and make your heart beat faster.  Keep all follow-up visits as told. This is important. You may need to have an exam of the large intestine. This is called a colonoscopy. Contact a doctor if:  Your pain does not get better.  You have a hard time eating or drinking.  You are not pooping like normal. Get help right away if:  Your pain gets worse.  Your problems do not get better.  Your problems get worse very fast.  You have a fever.  You throw up (vomit) more than one time.  You have poop that  is: ? Bloody. ? Black. ? Tarry. Summary  Diverticulitis is when small pockets in your large intestine (colon) get infected or swollen.  Take medicines only as told by your doctor.  Follow a diet as told by your doctor. This information is not intended to replace advice given to you by your health care provider. Make sure you discuss any questions you have with your health care provider. Document Released: 07/01/2007 Document Revised: 01/30/2016 Document Reviewed: 01/30/2016 Elsevier Interactive Patient Education  2017 [employer].

## 2017-01-27 NOTE — Progress Notes (Signed)
Chief Complaint  Patient presents with  . Follow-up    Pt states that she ran a fever starting 01/23/17 running 101. Fever finally broke last night. Pt wants to discuss CT abd/pelvis today.     HPI: [name] 67 y.o. come in for   "Fu  Acute diverticulitis"   Seen  59 31 by dr Raliegh Ip  After  Onset   Evening 12 29  Of achy lower abd pain  Some better but feels bad never like this.  Lost 10 #   Since onset   Hurt in stomach   And  At times was doubled over.   Mashed potatoes  And hard tpo eat.   Nausea  But no vomiting.   Had bm yesterday not much and now  Is grainy dark   Last night sweats and fever may have broken   No vomiting  Still very tender   See messages  ROS: See pertinent positives and negatives per HPI. Had knee surgery  12 19   Felt pop   Torn cartilage   Didn't have antibiotics  And has fu appt today .  Says feels very badly staying gin bed   And     Over all about 10-15% better  On cipro and flagyl   Past Medical History:  Diagnosis Date  . Acute pyelonephritis 05/02/2012  . Asthma   . Dyspnea    nl PFTs, and echo  . GERD (gastroesophageal reflux disease)   . Headache(784.0)   . Heart murmur   . History of kidney stones   . IBS (irritable bowel syndrome)   . Nephrolithiasis   . Pre-diabetes 12/15  . Recurrent oral ulcers   . Thyroid goiter    I 131 rx and then  total throidectomy 2005 [facility]    Family History  Problem Relation Age of Onset  . COPD Sister   . Coronary artery disease Sister        age 40 also copd  . Cancer Mother        bladder  . Arthritis Other   . Cancer Other        breast  . Diabetes Other   . Hyperlipidemia Other   . Hypertension Other   . Stroke Other   . Heart disease Other   . Coronary artery disease Father        also had AAA  . Aneurysm Father        Aortic and thoracic fatal    Social History   Socioeconomic History  . Marital status: Married    Spouse name: None  . Number of children: None  . Years of  education: None  . Highest education level: None  Social Needs  . Financial resource strain: None  . Food insecurity - worry: None  . Food insecurity - inability: None  . Transportation needs - medical: None  . Transportation needs - non-medical: None  Occupational History  . None  Tobacco Use  . Smoking status: Never Smoker  . Smokeless tobacco: Never Used  Substance and Sexual Activity  . Alcohol use: Yes    Comment: seldom  . Drug use: No  . Sexual activity: None  Other Topics Concern  . 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 acetaminophen (TYLENOL) 500 MG tablet Take 1,000 mg by mouth 3 (three) times daily.     Marland Kitchen olmesartan (BENICAR) 20 MG tablet Take 1 tablet (20 mg total) by mouth daily. 90 tablet 1  . ondansetron (ZOFRAN ODT) 4 MG disintegrating tablet Take 1 tablet (4 mg total) by mouth every 8 (eight) hours as needed for nausea or vomiting. 20 tablet 0  . pantoprazole (PROTONIX) 40 MG tablet TAKE 1 TABLET (40 MG TOTAL) BY MOUTH 2 (TWO) TIMES DAILY. 180 tablet 1  . rOPINIRole (REQUIP) 0.5 MG tablet TAKE 1 TO 2 TABLETS BY MOUTH AT NOON, AND 5PM AND 3 TABLETS AT BEDTIME 540 tablet 1  . rosuvastatin (CRESTOR) 10 MG tablet TAKE 1 TABLET BY MOUTH EVERY DAY 30 tablet 2  . SYNTHROID 88 MCG tablet TAKE 1 TABLET BY MOUTH ONCE DAILY 90 tablet 3  . ciprofloxacin (CIPRO) 500 MG tablet Take 1 tablet (500 mg total) by mouth 2 (two) times daily. 14 tablet 0  . metroNIDAZOLE (FLAGYL) 500 MG tablet Take 1 tablet (500 mg total) by mouth 3 (three) times daily. 21 tablet 0   No facility-administered medications prior to visit.      EXAM:  BP 108/76 (BP Location: Right Arm, Patient Position: Sitting, Cuff Size: Normal)   Pulse 84   Temp 97.9 F (36.6 C) (Oral)   Wt 162 lb 6.4 oz (73.7 kg)   BMI 32.80 kg/m     Body mass index is 32.8 kg/m.  GENERAL: vitals reviewed and listed above, alert, oriented, appears well hydrated and in no acute distress tired  No ntoxic  HEENT: atraumatic, conjunctiva  clear, no obvious abnormalities on inspection of external nose and ears OP : no lesion edema or exudate  NECK: no obvious masses on inspection palpation  LUNGS: clear to auscultation bilaterally, no wheezes, rales or rhonchi, good air movement CV: HRRR, no clubbing cyanosis or  peripheral edema nl cap refill  No murmur heard  Abdomen:  Dec but present  bowel sounds without hepatosplenomegaly, very tender llq and some mild local rebound?   no or masses no CVA tenderness MS: moves all extremities  Right knee favored  No skin rash nl cap refill  PSYCH: pleasant and cooperative, no obvious depression or anxiety Lab Results  Component Value Date   WBC 9.7 01/25/2017   HGB 13.9 01/25/2017   HCT 41.3 01/25/2017   PLT 267.0 01/25/2017   GLUCOSE 89 01/29/2017   CHOL 121 10/30/2016   TRIG 157.0 (H) 10/30/2016   HDL 45.40 10/30/2016   LDLDIRECT 142.0 01/28/2016   LDLCALC 44 10/30/2016   ALT 27 01/29/2017   AST 21 01/29/2017   NA 140 01/29/2017   K 4.5 01/29/2017   CL 101 01/29/2017   CREATININE 0.79 01/29/2017   BUN 16 01/29/2017   CO2 30 01/29/2017   TSH 4.46 01/28/2016   INR 0.97 10/27/2010   HGBA1C 6.2 01/28/2016   BP Readings from Last 3 Encounters:  01/29/17 108/76  01/25/17 126/70  10/18/16 111/86   Wt Readings from Last 3 Encounters:  01/29/17 162 lb 6.4 oz (73.7 kg)  01/25/17 165 lb 6.4 oz (75 kg)  07/31/16 175 lb (79.4 kg)    ASSESSMENT AND PLAN:  Discussed the following assessment and plan:  Acute diverticulitis - Plan: CT Abdomen Pelvis W Contrast, POCT urinalysis dipstick, CMP, CANCELED: CMP  Left lower quadrant pain - Plan: CT Abdomen Pelvis W Contrast, POCT urinalysis dipstick,  CMP, CANCELED: CMP  Generalized abdominal pain - Plan: CT Abdomen Pelvis W Contrast   Patient  still with significant symptoms although slightly improved over the last 48 hours.  This is 4-5 days into her antibiotics.  At this time it is Friday we will schedule stat abdominal CT rule out abscess or other process.  We will continue her antibiotics if consistent with diverticulitis for at least another 5 days.  And plan follow-up next week. See ct scan and fu  -Patient advised to return or notify health care team  if  new concerns arise.  Patient Instructions  Plan ct scan  To  Check .  for abscess   And confirmation of diagnosis   Stay on same  medication at this time  And I will send in  5 more days .     Of medication .       Standley Brooking. Panosh M.D.

## 2017-01-28 ENCOUNTER — Telehealth: Payer: Self-pay | Admitting: Family Medicine

## 2017-01-28 DIAGNOSIS — M25461 Effusion, right knee: Secondary | ICD-10-CM | POA: Diagnosis not present

## 2017-01-28 DIAGNOSIS — Z9889 Other specified postprocedural states: Secondary | ICD-10-CM | POA: Diagnosis not present

## 2017-01-28 DIAGNOSIS — M25561 Pain in right knee: Secondary | ICD-10-CM | POA: Diagnosis not present

## 2017-01-28 DIAGNOSIS — R29898 Other symptoms and signs involving the musculoskeletal system: Secondary | ICD-10-CM | POA: Diagnosis not present

## 2017-01-28 DIAGNOSIS — M25661 Stiffness of right knee, not elsewhere classified: Secondary | ICD-10-CM | POA: Diagnosis not present

## 2017-01-28 DIAGNOSIS — R262 Difficulty in walking, not elsewhere classified: Secondary | ICD-10-CM | POA: Diagnosis not present

## 2017-01-28 NOTE — Telephone Encounter (Signed)
Copied from [place]. Topic: Appointment Scheduling - Same Day Appointment >> Jan 28, 2017  8:27 AM Aurelio Brash B wrote: Patient called to schedule an appointment for TODAY with  PT states she has diverticulitis and wants to know if there is any way Dr Regis Bill will work her in today.   I spoke with pt just now, she feels worse now than on Monday when she saw Dr. Raliegh Ip. Her temp has been between 99-101, having loose stools and nausea. Pt is on the schedule to see Dr. Regis Bill in the morning. Please advise?

## 2017-01-28 NOTE — Telephone Encounter (Signed)
Spoke with patient, states that she will just wait until tomorrow 01/29/17 for the OV which is already scheduled.  Pt wants to wait until appt to discuss CT.  Will send to Dr Regis Bill as Juluis Rainier.

## 2017-01-28 NOTE — Telephone Encounter (Signed)
I can see her in am   But not in office this afternoon.  We can order an abdomina pelvic ct in the  Interim  Dx poss  diverticultis fever abd pain   Based on  Dr Eustaquio Boyden note  And see her in the am    or she can see another provider this afternoon .

## 2017-01-28 NOTE — Telephone Encounter (Signed)
Pt is requesting OV in AM with Dr Regis Bill.  There are no openings.  Please advise if able to work in  [place]: I am not here this afternoon, if needing to be scheduled please send to front staff to schedule or Hoyle Sauer may be able to assist in scheduling.

## 2017-01-29 ENCOUNTER — Encounter: Payer: Self-pay | Admitting: Internal Medicine

## 2017-01-29 ENCOUNTER — Ambulatory Visit (INDEPENDENT_AMBULATORY_CARE_PROVIDER_SITE_OTHER)
Admission: RE | Admit: 2017-01-29 | Discharge: 2017-01-29 | Disposition: A | Discharge status: Home / Self Care | Payer: Medicare Other | Source: Ambulatory Visit | Attending: Internal Medicine | Admitting: Internal Medicine

## 2017-01-29 ENCOUNTER — Ambulatory Visit (INDEPENDENT_AMBULATORY_CARE_PROVIDER_SITE_OTHER): Payer: Medicare Other | Admitting: Internal Medicine

## 2017-01-29 ENCOUNTER — Telehealth: Payer: Self-pay | Admitting: *Deleted

## 2017-01-29 VITALS — BP 108/76 | HR 84 | Temp 97.9°F | Wt 162.4 lb

## 2017-01-29 DIAGNOSIS — K5792 Diverticulitis of intestine, part unspecified, without perforation or abscess without bleeding: Secondary | ICD-10-CM | POA: Diagnosis not present

## 2017-01-29 DIAGNOSIS — R1084 Generalized abdominal pain: Secondary | ICD-10-CM | POA: Diagnosis not present

## 2017-01-29 DIAGNOSIS — R1032 Left lower quadrant pain: Secondary | ICD-10-CM | POA: Diagnosis not present

## 2017-01-29 DIAGNOSIS — R197 Diarrhea, unspecified: Secondary | ICD-10-CM | POA: Diagnosis not present

## 2017-01-29 DIAGNOSIS — K5730 Diverticulosis of large intestine without perforation or abscess without bleeding: Secondary | ICD-10-CM | POA: Diagnosis not present

## 2017-01-29 LAB — POCT URINALYSIS DIP (MANUAL ENTRY)
Bilirubin, UA: NEGATIVE
Glucose, UA: NEGATIVE mg/dL
Ketones, POC UA: NEGATIVE mg/dL
LEUKOCYTES UA: NEGATIVE
NITRITE UA: NEGATIVE
PH UA: 5.5 (ref 5.0–8.0)
RBC UA: NEGATIVE
Spec Grav, UA: >=1.03 — AB (ref 1.010–1.025)
UROBILINOGEN UA: 0.2 U/dL

## 2017-01-29 LAB — COMPREHENSIVE METABOLIC PANEL
ALT: 27 U/L (ref 0–35)
AST: 21 U/L (ref 0–37)
Albumin: 4.2 g/dL (ref 3.5–5.2)
Alkaline Phosphatase: 62 U/L (ref 39–117)
BUN: 16 mg/dL (ref 6–23)
CO2: 30 meq/L (ref 19–32)
CREATININE: 0.79 mg/dL (ref 0.40–1.20)
Calcium: 9.4 mg/dL (ref 8.4–10.5)
Chloride: 101 mEq/L (ref 96–112)
GFR: 77.26 mL/min (ref >60.00)
GLUCOSE: 89 mg/dL (ref 70–99)
Potassium: 4.5 mEq/L (ref 3.5–5.1)
SODIUM: 140 meq/L (ref 135–145)
Total Bilirubin: 1 mg/dL (ref 0.2–1.2)
Total Protein: 6.7 g/dL (ref 6.0–8.3)

## 2017-01-29 MED ORDER — IOPAMIDOL (ISOVUE-300) INJECTION 61%
100.0000 mL | Freq: Once | INTRAVENOUS | Status: AC | PRN
  Administered 2017-01-29: 100 mL via INTRAVENOUS

## 2017-01-29 MED ORDER — CIPROFLOXACIN HCL 500 MG PO TABS: 500.0000 mg | ORAL_TABLET | Freq: Two times a day (BID) | ORAL | 0 refills | Status: DC

## 2017-01-29 MED ORDER — METRONIDAZOLE 500 MG PO TABS
500.0000 mg | ORAL_TABLET | Freq: Three times a day (TID) | ORAL | 0 refills | Status: DC
Start: 2017-01-29 — End: 2017-03-28

## 2017-01-29 NOTE — Telephone Encounter (Signed)
Received call from [place] [facility] needing patient's labs changed from normal to stat in order to have them back in time for stat CT scheduled for 2pm today. Discussed w/ Colletta Maryland in the lab and she updated labs to stat and will have them picked up with next courier, which should have them back prior to scheduled imaging.

## 2017-01-29 NOTE — Patient Instructions (Signed)
Plan ct scan  To  Check .  for abscess   And confirmation of diagnosis   Stay on same  medication at this time  And I will send in  5 more days .     Of medication .

## 2017-02-01 ENCOUNTER — Ambulatory Visit: Payer: Self-pay | Admitting: Internal Medicine

## 2017-02-01 DIAGNOSIS — R29898 Other symptoms and signs involving the musculoskeletal system: Secondary | ICD-10-CM | POA: Diagnosis not present

## 2017-02-01 DIAGNOSIS — M25461 Effusion, right knee: Secondary | ICD-10-CM | POA: Diagnosis not present

## 2017-02-01 DIAGNOSIS — M25561 Pain in right knee: Secondary | ICD-10-CM | POA: Diagnosis not present

## 2017-02-01 DIAGNOSIS — M25661 Stiffness of right knee, not elsewhere classified: Secondary | ICD-10-CM | POA: Diagnosis not present

## 2017-02-01 DIAGNOSIS — R262 Difficulty in walking, not elsewhere classified: Secondary | ICD-10-CM | POA: Diagnosis not present

## 2017-02-02 NOTE — Progress Notes (Signed)
Chief Complaint  Patient presents with  . Follow-up    follow up CT. Pt states that she is still having some pain and nausea. Decreased enerrgy.     HPI: [name] 67 y.o. come in for fu  diverticulitis   Getting better but still has sx  Has 4 more days of medicine   Pain  Is not that bad but nausea and tired and no energy  No vomiting  Blood in stool   Now has a  Congestion   Runny nose  No fever and fatigue.   ROS: See pertinent positives and negatives per HPI. No fever  Cp sob very tired  No uti sx   No syncope   Past Medical History:  Diagnosis Date  . Acute pyelonephritis 05/02/2012  . Asthma   . Dyspnea    nl PFTs, and echo  . GERD (gastroesophageal reflux disease)   . Headache(784.0)   . Heart murmur   . History of kidney stones   . IBS (irritable bowel syndrome)   . Nephrolithiasis   . Pre-diabetes 12/15  . Recurrent oral ulcers   . Thyroid goiter    I 131 rx and then  total throidectomy 2005 [facility]    Family History  Problem Relation Age of Onset  . COPD Sister   . Coronary artery disease Sister        age 85 also copd  . Cancer Mother        bladder  . Arthritis Other   . Cancer Other        breast  . Diabetes Other   . Hyperlipidemia Other   . Hypertension Other   . Stroke Other   . Heart disease Other   . Coronary artery disease Father        also had AAA  . Aneurysm Father        Aortic and thoracic fatal    Social History   Socioeconomic History  . Marital status: Married    Spouse name: None  . Number of children: None  . Years of education: None  . Highest education level: None  Social Needs  . Financial resource strain: None  . Food insecurity - worry: None  . Food insecurity - inability: None  . Transportation needs - medical: None  . Transportation needs - non-medical: None  Occupational History  . None  Tobacco Use  . Smoking status: Never Smoker  . Smokeless tobacco: Never Used  Substance and Sexual Activity  .  Alcohol use: Yes    Comment: seldom  . Drug use: No  . Sexual activity: None  Other Topics Concern  . None  Social History Narrative   Married   Has grandchildren   Never smoked    G4P4   hh of 2  care of GC ages 66 - 81 month  5 days a week    Outpatient Medications Prior to Visit  Medication Sig Dispense Refill  . acetaminophen (TYLENOL) 500 MG tablet Take 1,000 mg by mouth 3 (three) times daily.     . ciprofloxacin (CIPRO) 500 MG tablet Take 1 tablet (500 mg total) by mouth 2 (two) times daily. 10 tablet 0  . metroNIDAZOLE (FLAGYL) 500 MG tablet Take 1 tablet (500 mg total) by mouth 3 (three) times daily. 15 tablet 0  . olmesartan (BENICAR) 20 MG tablet Take 1 tablet (20 mg total) by mouth daily. 90 tablet 1  . ondansetron (ZOFRAN ODT) 4 MG disintegrating tablet  Take 1 tablet (4 mg total) by mouth every 8 (eight) hours as needed for nausea or vomiting. 20 tablet 0  . pantoprazole (PROTONIX) 40 MG tablet TAKE 1 TABLET (40 MG TOTAL) BY MOUTH 2 (TWO) TIMES DAILY. 180 tablet 1  . rOPINIRole (REQUIP) 0.5 MG tablet TAKE 1 TO 2 TABLETS BY MOUTH AT NOON, AND 5PM AND 3 TABLETS AT BEDTIME 540 tablet 1  . rosuvastatin (CRESTOR) 10 MG tablet TAKE 1 TABLET BY MOUTH EVERY DAY 30 tablet 2  . SYNTHROID 88 MCG tablet TAKE 1 TABLET BY MOUTH ONCE DAILY 90 tablet 3   No facility-administered medications prior to visit.      EXAM:  BP 134/80 (BP Location: Right Arm, Patient Position: Sitting, Cuff Size: Normal)   Pulse 81   Temp 98.5 F (36.9 C) (Oral)   Wt 164 lb 9.6 oz (74.7 kg)   BMI 33.25 kg/m   Body mass index is 33.25 kg/m.  GENERAL: vitals reviewed and listed above, alert, oriented, appears well hydrated and in no acute distress mild congestion  No face pain  HEENT: atraumatic, conjunctiva  clear, no obvious abnormalities on inspection of external nose and ears OP : no lesion edema or exudate  NECK: no obvious masses on inspection palpation  LUNGS: clear to auscultation bilaterally,  no wheezes, rales or rhonchi, good air movement CV: HRRR, no clubbing cyanosis or  peripheral edema nl cap refill  Abdomen:  Sof,t normal bowel sounds without hepatosplenomegaly,  llq tenderness no rebound and no masses noted    No flank pain PSYCH: pleasant and cooperative, no obvious depression or anxiety Lab Results  Component Value Date   WBC 9.7 01/25/2017   HGB 13.9 01/25/2017   HCT 41.3 01/25/2017   PLT 267.0 01/25/2017   GLUCOSE 89 01/29/2017   CHOL 121 10/30/2016   TRIG 157.0 (H) 10/30/2016   HDL 45.40 10/30/2016   LDLDIRECT 142.0 01/28/2016   LDLCALC 44 10/30/2016   ALT 27 01/29/2017   AST 21 01/29/2017   NA 140 01/29/2017   K 4.5 01/29/2017   CL 101 01/29/2017   CREATININE 0.79 01/29/2017   BUN 16 01/29/2017   CO2 30 01/29/2017   TSH 4.46 01/28/2016   INR 0.97 10/27/2010   HGBA1C 6.2 01/28/2016   BP Readings from Last 3 Encounters:  02/03/17 134/80  01/29/17 108/76  01/25/17 126/70    ASSESSMENT AND PLAN:  Discussed the following assessment and plan:  Acute diverticulitis - Plan: Ambulatory referral to Gastroenterology  URI, acute  Other fatigue Slow improvement   See ct will need Gi follow up    resp seems viral uncomplicated  Adding to sx  Will do referral but she can call dr Oswald Hillock office -Patient advised to return or notify health care team  if  new concerns arise.  Patient Instructions  Will do a referral  To Dr.   Earlean Shawl   For fu of the diverticulitis and  Will need a colonoscopy.    Eventually  . Marland Kitchen   Finish the antibiotics    Low   residue diet .    I think the respiratory sx  Is a viral infection .    Cancel   Friday appt and reschedule that for  A month from now .     Standley Brooking. Panosh M.D.

## 2017-02-03 ENCOUNTER — Encounter: Payer: Self-pay | Admitting: Internal Medicine

## 2017-02-03 ENCOUNTER — Ambulatory Visit (INDEPENDENT_AMBULATORY_CARE_PROVIDER_SITE_OTHER): Payer: Medicare Other | Admitting: Internal Medicine

## 2017-02-03 VITALS — BP 134/80 | HR 81 | Temp 98.5°F | Wt 164.6 lb

## 2017-02-03 DIAGNOSIS — J069 Acute upper respiratory infection, unspecified: Secondary | ICD-10-CM

## 2017-02-03 DIAGNOSIS — R5383 Other fatigue: Secondary | ICD-10-CM | POA: Diagnosis not present

## 2017-02-03 DIAGNOSIS — K5792 Diverticulitis of intestine, part unspecified, without perforation or abscess without bleeding: Secondary | ICD-10-CM

## 2017-02-03 NOTE — Patient Instructions (Addendum)
Will do a referral  To Dr.   Earlean Shawl   For fu of the diverticulitis and  Will need a colonoscopy.    Eventually  . Marland Kitchen   Finish the antibiotics    Low   residue diet .    I think the respiratory sx  Is a viral infection .    Cancel   Friday appt and reschedule that for  A month from now .

## 2017-02-05 ENCOUNTER — Ambulatory Visit: Payer: Self-pay | Admitting: Internal Medicine

## 2017-02-05 DIAGNOSIS — R29898 Other symptoms and signs involving the musculoskeletal system: Secondary | ICD-10-CM | POA: Diagnosis not present

## 2017-02-05 DIAGNOSIS — Z9889 Other specified postprocedural states: Secondary | ICD-10-CM | POA: Diagnosis not present

## 2017-02-05 DIAGNOSIS — M25661 Stiffness of right knee, not elsewhere classified: Secondary | ICD-10-CM | POA: Diagnosis not present

## 2017-02-05 DIAGNOSIS — R262 Difficulty in walking, not elsewhere classified: Secondary | ICD-10-CM | POA: Diagnosis not present

## 2017-02-05 DIAGNOSIS — M25461 Effusion, right knee: Secondary | ICD-10-CM | POA: Diagnosis not present

## 2017-02-05 DIAGNOSIS — M25561 Pain in right knee: Secondary | ICD-10-CM | POA: Diagnosis not present

## 2017-02-12 DIAGNOSIS — M25661 Stiffness of right knee, not elsewhere classified: Secondary | ICD-10-CM | POA: Diagnosis not present

## 2017-02-12 DIAGNOSIS — R262 Difficulty in walking, not elsewhere classified: Secondary | ICD-10-CM | POA: Diagnosis not present

## 2017-02-12 DIAGNOSIS — R29898 Other symptoms and signs involving the musculoskeletal system: Secondary | ICD-10-CM | POA: Diagnosis not present

## 2017-02-12 DIAGNOSIS — Z9889 Other specified postprocedural states: Secondary | ICD-10-CM | POA: Diagnosis not present

## 2017-02-12 DIAGNOSIS — M25561 Pain in right knee: Secondary | ICD-10-CM | POA: Diagnosis not present

## 2017-02-20 ENCOUNTER — Other Ambulatory Visit: Payer: Self-pay | Admitting: Internal Medicine

## 2017-02-23 DIAGNOSIS — K219 Gastro-esophageal reflux disease without esophagitis: Secondary | ICD-10-CM | POA: Diagnosis not present

## 2017-02-27 ENCOUNTER — Other Ambulatory Visit: Payer: Self-pay | Admitting: Internal Medicine

## 2017-03-07 NOTE — Progress Notes (Signed)
Chief Complaint  Patient presents with  . Annual Exam    no new concerns    HPI: [name] 67 y.o. comes in today for yearly exam   Med check disease management   Bp ok   Problematic  Legs rls and cramping are interfering  On medication    Less exercise recently      Lipid taking med qd   Avoiding sugars   Knee   Healed  After surgery  .Since last visit.  Due for mammo and dexa   To get colon soon Medoff  Health Maintenance  Topic Date Due  . MAMMOGRAM  04/24/2016  . PNA vac Low Risk Adult (2 of 2 - PPSV23) 01/27/2017  . INFLUENZA VACCINE  10/01/2017 (Originally 08/26/2016)  . COLONOSCOPY  08/30/2022  . TETANUS/TDAP  01/25/2023  . DEXA SCAN  Completed  . Hepatitis C Screening  Completed   Health Maintenance Review LIFESTYLE:  Exercise:   actrive   [place]  caretaking alzheimer's   mowing Tobacco/ETS: no Alcohol:  no Sugar beverages:no Sleep: 6-7  Drug use: no HH:  2  coloon march 11  .  medoff  Due for mammogram   At [facility] .   rls  Not good  At times .  Weather changes    Hearing:   Has appt with ear doc  Wax.     Vision:  No limitations at present . Last eye check UTD  Safety:  Has smoke detector and wears seat belts.   No excess sun exposure. Sees dentist regularly.  Falls: no  Memory: Felt to be good  , no concern from her or her family.  Depression: No anhedonia unusual crying or depressive symptoms  Nutrition: Eats well balanced diet; adequate calcium and vitamin D. No swallowing chewing problems.  Injury: no major injuries in the last six months.  Other healthcare providers:  Reviewed today .  Social:  Lives with spouse married. .   Preventive parameters: up-to-date  Reviewed   ADLS:   There are no problems or need for assistance  driving, feeding, obtaining food, dressing, toileting and bathing, managing money using phone. She is independent.   ROS:  See above    GEN/ HEENT: No fever, significant weight changes sweats headaches  vision problems hearing changes, CV/ PULM; No chest pain shortness of breath cough, syncope,edema  change in exercise tolerance. GI /GU: No adominal pain, vomiting, change in bowel habits. No blood in the stool. No significant GU symptoms. SKIN/HEME: ,no acute skin rashes suspicious lesions or bleeding. No lymphadenopathy, nodules, masses.  NEURO/ PSYCH:  No neurologic signs such as weakness numbness. No depression anxiety. IMM/ Allergy: No unusual infections.  Allergy .   REST of 12 system review negative except as per HPI   Past Medical History:  Diagnosis Date  . Acute pyelonephritis 05/02/2012  . Asthma   . Dyspnea    nl PFTs, and echo  . GERD (gastroesophageal reflux disease)   . Headache(784.0)   . Heart murmur   . History of kidney stones   . IBS (irritable bowel syndrome)   . Nephrolithiasis   . Pre-diabetes 12/15  . Recurrent oral ulcers   . Thyroid goiter    I 131 rx and then  total throidectomy 2005 [facility]    Family History  Problem Relation Age of Onset  . COPD Sister   . Coronary artery disease Sister        age 25 also copd  . Cancer  Mother        bladder  . Arthritis Other   . Cancer Other        breast  . Diabetes Other   . Hyperlipidemia Other   . Hypertension Other   . Stroke Other   . Heart disease Other   . Coronary artery disease Father        also had AAA  . Aneurysm Father        Aortic and thoracic fatal    Social History   Socioeconomic History  . Marital status: Married    Spouse name: None  . Number of children: None  . Years of education: None  . Highest education level: None  Social Needs  . Financial resource strain: None  . Food insecurity - worry: None  . Food insecurity - inability: None  . Transportation needs - medical: None  . Transportation needs - non-medical: None  Occupational History  . None  Tobacco Use  . Smoking status: Never Smoker  . Smokeless tobacco: Never Used  Substance and Sexual Activity  . Alcohol  use: Yes    Comment: seldom  . Drug use: No  . Sexual activity: None  Other Topics Concern  . 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 acetaminophen (TYLENOL) 500 MG tablet Take 1,000 mg by mouth 3 (three) times daily.   . ciprofloxacin (CIPRO) 500 MG tablet Take 1 tablet (500 mg total) by mouth 2 (two) times daily.  . metroNIDAZOLE (FLAGYL) 500 MG tablet Take 1 tablet (500 mg total) by mouth 3 (three) times daily.  Marland Kitchen olmesartan (BENICAR) 20 MG tablet Take 1 tablet (20 mg total) by mouth daily.  . ondansetron (ZOFRAN ODT) 4 MG disintegrating tablet Take 1 tablet (4 mg total) by mouth every 8 (eight) hours as needed for nausea or vomiting.  . pantoprazole (PROTONIX) 40 MG tablet TAKE 1 TABLET (40 MG TOTAL) BY MOUTH 2 (TWO) TIMES DAILY.  Marland Kitchen rOPINIRole (REQUIP) 0.5 MG tablet TAKE 1 TO 2 TABLETS BY MOUTH AT NOON, AND 5PM AND 3 TABLETS AT BEDTIME  . rosuvastatin (CRESTOR) 10 MG tablet TAKE 1 TABLET BY MOUTH EVERY DAY  . SYNTHROID 88 MCG tablet TAKE 1 TABLET BY MOUTH ONCE DAILY   No facility-administered encounter medications on file as of 03/09/2017.     EXAM:  BP (!) 142/84 (BP Location: Right Arm, Patient Position: Sitting, Cuff Size: Normal)   Pulse 78   Temp 98.2 F (36.8 C) (Oral)   Ht 4' 11.5" (1.511 m)   Wt 168 lb (76.2 kg)   BMI 33.36 kg/m   Body mass index is 33.36 kg/m.  Physical Exam: Vital signs reviewed MWN:UUVO is a well-developed well-nourished alert cooperative   who appears stated age in no acute distress.  HEENT: normocephalic atraumatic , Eyes: PERRL EOM's full, conjunctiva clear, Nares: paten,t no deformity discharge or tenderness., Ears: no deformity EAC's clear TMs with normal landmarks. Mouth: clear OP, no lesions, edema.  Moist mucous membranes. Dentition in adequate repair. NECK: supple  without masses, thyromegaly or bruits. CHEST/PULM:  Clear to auscultation and percussion breath sounds equal no wheeze , rales or rhonchi. No chest wall deformities or tenderness.Breast: normal by inspection . No dimpling, discharge, masses,  tenderness or discharge . CV: PMI is nondisplaced, S1 S2 no gallops, murmurs, rubs. Peripheral pulses are presentwithout delay..  Some vv  ABDOMEN: Bowel sounds normal nontender  No guard or rebound, no hepato splenomegal no CVA tenderness.   Extremtities:  No clubbing cyanosis or edema, no acute joint swelling or redness no focal atrophy healed knee scar   NEURO:  Oriented x3, cranial nerves 3-12 appear to be intact, no obvious focal weakness,gait within normal limits no abnormal reflexes or asymmetrical SKIN: No acute rashes normal turgor, color, no bruising or petechiae. PSYCH: Oriented, good eye contact, no obvious depression anxiety, cognition and judgment appear normal. LN: no cervical axillary l adenopathy No noted deficits in memory, attention, and speech.   Lab Results  Component Value Date   WBC 9.7 01/25/2017   HGB 13.9 01/25/2017   HCT 41.3 01/25/2017   PLT 267.0 01/25/2017   GLUCOSE 89 01/29/2017   CHOL 131 03/09/2017   TRIG 171.0 (H) 03/09/2017   HDL 46.10 03/09/2017   LDLDIRECT 142.0 01/28/2016   LDLCALC 50 03/09/2017   ALT 27 01/29/2017   AST 21 01/29/2017   NA 140 01/29/2017   K 4.5 01/29/2017   CL 101 01/29/2017   CREATININE 0.79 01/29/2017   BUN 16 01/29/2017   CO2 30 01/29/2017   TSH 4.46 01/28/2016   INR 0.97 10/27/2010   HGBA1C 6.2 01/28/2016    ASSESSMENT AND PLAN:  Discussed the following assessment and plan:  Hyperlipidemia, unspecified hyperlipidemia type - Plan: Lipid panel  Restless legs syndrome (RLS) - most problematic  unceratin if on optimum med rx plan   will consult sleep neuro dr Dohmier about help with assessment rx of the rls - Plan: Ambulatory referral to Neurology  Medication management - Plan:  Lipid panel, Ambulatory referral to Neurology  Estrogen deficiency  Osteopenia, unspecified location  Need for pneumococcal vaccination - Plan: Pneumococcal polysaccharide vaccine 23-valent greater than or equal to 2yo subcutaneous/IM  Essential hypertension  Abnormal LFTs - improved   felt to be from fatty liver  Patient Care Team: Burnis Medin, MD as PCP - General Sallyanne Havers, MD as Referring Physician (Orthopedic Surgery) Lowella Bandy, MD as Attending Physician (Urology) Josue Hector, MD as Consulting Physician (Cardiology) Richmond Campbell, MD as Consulting Physician (Gastroenterology)  Patient Instructions  Will send over order  To [facility] for bone density   .   Will get referral for dr Dohmier about the restless leg issue  Checking  Lipids  Today   Let us know  If need and exercise prescription.  ROV in 6 month to check sugar   Was ok last check       Standley Brooking. Panosh M.D.

## 2017-03-09 ENCOUNTER — Encounter: Payer: Self-pay | Admitting: Internal Medicine

## 2017-03-09 ENCOUNTER — Ambulatory Visit (INDEPENDENT_AMBULATORY_CARE_PROVIDER_SITE_OTHER): Payer: Medicare Other | Admitting: Internal Medicine

## 2017-03-09 VITALS — BP 142/84 | HR 78 | Temp 98.2°F | Ht 59.5 in | Wt 168.0 lb

## 2017-03-09 DIAGNOSIS — R7989 Other specified abnormal findings of blood chemistry: Secondary | ICD-10-CM

## 2017-03-09 DIAGNOSIS — R945 Abnormal results of liver function studies: Secondary | ICD-10-CM

## 2017-03-09 DIAGNOSIS — I10 Essential (primary) hypertension: Secondary | ICD-10-CM

## 2017-03-09 DIAGNOSIS — E2839 Other primary ovarian failure: Secondary | ICD-10-CM

## 2017-03-09 DIAGNOSIS — Z79899 Other long term (current) drug therapy: Secondary | ICD-10-CM

## 2017-03-09 DIAGNOSIS — G2581 Restless legs syndrome: Secondary | ICD-10-CM

## 2017-03-09 DIAGNOSIS — M8580 Other specified disorders of bone density and structure, unspecified site: Secondary | ICD-10-CM

## 2017-03-09 DIAGNOSIS — E785 Hyperlipidemia, unspecified: Secondary | ICD-10-CM

## 2017-03-09 DIAGNOSIS — Z23 Encounter for immunization: Secondary | ICD-10-CM | POA: Diagnosis not present

## 2017-03-09 LAB — LIPID PANEL
Cholesterol: 131 mg/dL (ref 0–200)
HDL: 46.1 mg/dL (ref >39.00)
LDL Cholesterol: 50 mg/dL (ref 0–99)
NonHDL: 84.69
TRIGLYCERIDES: 171 mg/dL — AB (ref 0.0–149.0)
Total CHOL/HDL Ratio: 3
VLDL: 34.2 mg/dL (ref 0.0–40.0)

## 2017-03-09 NOTE — Patient Instructions (Addendum)
Will send over order  To [facility] for bone density   .   Will get referral for dr Dohmier about the restless leg issue  Checking  Lipids  Today   Let us know  If need and exercise prescription.  ROV in 6 month to check sugar   Was ok last check

## 2017-03-10 DIAGNOSIS — Z1231 Encounter for screening mammogram for malignant neoplasm of breast: Secondary | ICD-10-CM | POA: Diagnosis not present

## 2017-03-10 DIAGNOSIS — Z803 Family history of malignant neoplasm of breast: Secondary | ICD-10-CM | POA: Diagnosis not present

## 2017-03-10 DIAGNOSIS — M8589 Other specified disorders of bone density and structure, multiple sites: Secondary | ICD-10-CM | POA: Diagnosis not present

## 2017-03-10 LAB — HM DEXA SCAN

## 2017-03-16 ENCOUNTER — Other Ambulatory Visit: Payer: Self-pay

## 2017-03-16 ENCOUNTER — Inpatient Hospital Stay (HOSPITAL_COMMUNITY)
Admission: EM | Admit: 2017-03-16 | Discharge: 2017-03-31 | DRG: 331 | Disposition: A | Discharge status: Home / Self Care | Payer: Medicare Other | Attending: Internal Medicine | Admitting: Internal Medicine

## 2017-03-16 ENCOUNTER — Emergency Department (HOSPITAL_COMMUNITY): Payer: Medicare Other

## 2017-03-16 ENCOUNTER — Encounter (HOSPITAL_COMMUNITY): Payer: Self-pay

## 2017-03-16 DIAGNOSIS — K5792 Diverticulitis of intestine, part unspecified, without perforation or abscess without bleeding: Secondary | ICD-10-CM | POA: Diagnosis not present

## 2017-03-16 DIAGNOSIS — I361 Nonrheumatic tricuspid (valve) insufficiency: Secondary | ICD-10-CM | POA: Diagnosis not present

## 2017-03-16 DIAGNOSIS — Z87442 Personal history of urinary calculi: Secondary | ICD-10-CM | POA: Diagnosis not present

## 2017-03-16 DIAGNOSIS — E785 Hyperlipidemia, unspecified: Secondary | ICD-10-CM | POA: Diagnosis present

## 2017-03-16 DIAGNOSIS — Z8052 Family history of malignant neoplasm of bladder: Secondary | ICD-10-CM | POA: Diagnosis not present

## 2017-03-16 DIAGNOSIS — Z408 Encounter for other prophylactic surgery: Secondary | ICD-10-CM | POA: Diagnosis not present

## 2017-03-16 DIAGNOSIS — N390 Urinary tract infection, site not specified: Secondary | ICD-10-CM | POA: Diagnosis not present

## 2017-03-16 DIAGNOSIS — Z96652 Presence of left artificial knee joint: Secondary | ICD-10-CM | POA: Diagnosis present

## 2017-03-16 DIAGNOSIS — K589 Irritable bowel syndrome without diarrhea: Secondary | ICD-10-CM | POA: Diagnosis present

## 2017-03-16 DIAGNOSIS — K219 Gastro-esophageal reflux disease without esophagitis: Secondary | ICD-10-CM | POA: Diagnosis present

## 2017-03-16 DIAGNOSIS — E039 Hypothyroidism, unspecified: Secondary | ICD-10-CM

## 2017-03-16 DIAGNOSIS — E7800 Pure hypercholesterolemia, unspecified: Secondary | ICD-10-CM | POA: Diagnosis present

## 2017-03-16 DIAGNOSIS — E890 Postprocedural hypothyroidism: Secondary | ICD-10-CM | POA: Diagnosis present

## 2017-03-16 DIAGNOSIS — R001 Bradycardia, unspecified: Secondary | ICD-10-CM | POA: Diagnosis not present

## 2017-03-16 DIAGNOSIS — Z6833 Body mass index (BMI) 33.0-33.9, adult: Secondary | ICD-10-CM | POA: Diagnosis not present

## 2017-03-16 DIAGNOSIS — Z8249 Family history of ischemic heart disease and other diseases of the circulatory system: Secondary | ICD-10-CM

## 2017-03-16 DIAGNOSIS — R8290 Unspecified abnormal findings in urine: Secondary | ICD-10-CM | POA: Diagnosis present

## 2017-03-16 DIAGNOSIS — I959 Hypotension, unspecified: Secondary | ICD-10-CM | POA: Diagnosis not present

## 2017-03-16 DIAGNOSIS — K5720 Diverticulitis of large intestine with perforation and abscess without bleeding: Secondary | ICD-10-CM | POA: Diagnosis present

## 2017-03-16 DIAGNOSIS — Z803 Family history of malignant neoplasm of breast: Secondary | ICD-10-CM | POA: Diagnosis not present

## 2017-03-16 DIAGNOSIS — Z881 Allergy status to other antibiotic agents status: Secondary | ICD-10-CM

## 2017-03-16 DIAGNOSIS — K5732 Diverticulitis of large intestine without perforation or abscess without bleeding: Secondary | ICD-10-CM | POA: Diagnosis present

## 2017-03-16 DIAGNOSIS — Z90710 Acquired absence of both cervix and uterus: Secondary | ICD-10-CM | POA: Diagnosis not present

## 2017-03-16 DIAGNOSIS — R7303 Prediabetes: Secondary | ICD-10-CM | POA: Diagnosis present

## 2017-03-16 DIAGNOSIS — I10 Essential (primary) hypertension: Secondary | ICD-10-CM | POA: Diagnosis not present

## 2017-03-16 DIAGNOSIS — Z888 Allergy status to other drugs, medicaments and biological substances status: Secondary | ICD-10-CM | POA: Diagnosis not present

## 2017-03-16 DIAGNOSIS — K5730 Diverticulosis of large intestine without perforation or abscess without bleeding: Secondary | ICD-10-CM | POA: Diagnosis not present

## 2017-03-16 DIAGNOSIS — G2581 Restless legs syndrome: Secondary | ICD-10-CM

## 2017-03-16 DIAGNOSIS — E669 Obesity, unspecified: Secondary | ICD-10-CM | POA: Diagnosis not present

## 2017-03-16 DIAGNOSIS — R5383 Other fatigue: Secondary | ICD-10-CM | POA: Diagnosis not present

## 2017-03-16 DIAGNOSIS — R1032 Left lower quadrant pain: Secondary | ICD-10-CM | POA: Diagnosis not present

## 2017-03-16 LAB — URINALYSIS, ROUTINE W REFLEX MICROSCOPIC
BILIRUBIN URINE: NEGATIVE
Glucose, UA: NEGATIVE mg/dL
Hgb urine dipstick: NEGATIVE
Ketones, ur: 5 mg/dL — AB
Nitrite: NEGATIVE
PROTEIN: NEGATIVE mg/dL
SPECIFIC GRAVITY, URINE: 1.025 (ref 1.005–1.030)
pH: 5 (ref 5.0–8.0)

## 2017-03-16 LAB — CBC
HCT: 35.8 % — ABNORMAL LOW (ref 36.0–46.0)
HEMATOCRIT: 39.4 % (ref 36.0–46.0)
Hemoglobin: 13.4 g/dL (ref 12.0–15.0)
Hemoglobin: 12.3 g/dL (ref 12.0–15.0)
MCH: 32.4 pg (ref 26.0–34.0)
MCH: 33 pg (ref 26.0–34.0)
MCHC: 34 g/dL (ref 30.0–36.0)
MCHC: 34.4 g/dL (ref 30.0–36.0)
MCV: 95.4 fL (ref 78.0–100.0)
MCV: 96 fL (ref 78.0–100.0)
PLATELETS: 232 10*3/uL (ref 150–400)
Platelets: 251 10*3/uL (ref 150–400)
RBC: 4.13 MIL/uL (ref 3.87–5.11)
RBC: 3.73 MIL/uL — ABNORMAL LOW (ref 3.87–5.11)
RDW: 13.4 % (ref 11.5–15.5)
RDW: 13.7 % (ref 11.5–15.5)
WBC: 11.2 10*3/uL — AB (ref 4.0–10.5)
WBC: 11.5 10*3/uL — ABNORMAL HIGH (ref 4.0–10.5)

## 2017-03-16 LAB — COMPREHENSIVE METABOLIC PANEL
ALBUMIN: 4.2 g/dL (ref 3.5–5.0)
ALT: 25 U/L (ref 14–54)
AST: 19 U/L (ref 15–41)
Alkaline Phosphatase: 62 U/L (ref 38–126)
Anion gap: 13 (ref 5–15)
BUN: 12 mg/dL (ref 6–20)
CO2: 23 mmol/L (ref 22–32)
Calcium: 9.1 mg/dL (ref 8.9–10.3)
Chloride: 101 mmol/L (ref 101–111)
Creatinine, Ser: 0.95 mg/dL (ref 0.44–1.00)
GFR calc Af Amer: >60 mL/min (ref >60)
GFR calc non Af Amer: >60 mL/min (ref >60)
GLUCOSE: 105 mg/dL — AB (ref 65–99)
POTASSIUM: 3.7 mmol/L (ref 3.5–5.1)
Sodium: 137 mmol/L (ref 135–145)
Total Bilirubin: 2.1 mg/dL — ABNORMAL HIGH (ref 0.3–1.2)
Total Protein: 7.4 g/dL (ref 6.5–8.1)

## 2017-03-16 LAB — CREATININE, SERUM
CREATININE: 0.86 mg/dL (ref 0.44–1.00)
GFR calc Af Amer: >60 mL/min (ref >60)
GFR calc non Af Amer: >60 mL/min (ref >60)

## 2017-03-16 LAB — GLUCOSE, CAPILLARY: GLUCOSE-CAPILLARY: 102 mg/dL — AB (ref 65–99)

## 2017-03-16 LAB — LIPASE, BLOOD: LIPASE: 24 U/L (ref 11–51)

## 2017-03-16 MED ORDER — PIPERACILLIN-TAZOBACTAM 3.375 G IVPB
3.3750 g | Freq: Three times a day (TID) | INTRAVENOUS | Status: DC
  Administered 2017-03-17 – 2017-03-24 (×23): 3.375 g via INTRAVENOUS
  Filled 2017-03-16 (×23): qty 50

## 2017-03-16 MED ORDER — HYDROCODONE-ACETAMINOPHEN 5-325 MG PO TABS
1.0000 | ORAL_TABLET | ORAL | Status: DC | PRN
  Administered 2017-03-16: 1 via ORAL
  Administered 2017-03-21: 2 via ORAL
  Administered 2017-03-27 – 2017-03-28 (×3): 1 via ORAL
  Filled 2017-03-16: qty 2
  Filled 2017-03-16 (×3): qty 1
  Filled 2017-03-16 (×3): qty 2
  Filled 2017-03-16: qty 1
  Filled 2017-03-16: qty 2

## 2017-03-16 MED ORDER — PANTOPRAZOLE SODIUM 40 MG PO TBEC
40.0000 mg | DELAYED_RELEASE_TABLET | Freq: Every day | ORAL | Status: DC
  Administered 2017-03-16 – 2017-03-23 (×8): 40 mg via ORAL
  Filled 2017-03-16 (×7): qty 1

## 2017-03-16 MED ORDER — BISACODYL 5 MG PO TBEC: 5.0000 mg | DELAYED_RELEASE_TABLET | Freq: Every day | ORAL | Status: DC | PRN

## 2017-03-16 MED ORDER — ACETAMINOPHEN 500 MG PO TABS
1000.0000 mg | ORAL_TABLET | Freq: Three times a day (TID) | ORAL | Status: DC
  Administered 2017-03-16 – 2017-03-23 (×21): 1000 mg via ORAL
  Filled 2017-03-16 (×22): qty 2

## 2017-03-16 MED ORDER — ACETAMINOPHEN 325 MG PO TABS
650.0000 mg | ORAL_TABLET | Freq: Four times a day (QID) | ORAL | Status: DC | PRN
  Administered 2017-03-17: 650 mg via ORAL
  Filled 2017-03-16: qty 2

## 2017-03-16 MED ORDER — IOPAMIDOL (ISOVUE-300) INJECTION 61%
100.0000 mL | Freq: Once | INTRAVENOUS | Status: AC | PRN
  Administered 2017-03-16: 100 mL via INTRAVENOUS

## 2017-03-16 MED ORDER — ACETAMINOPHEN 325 MG PO TABS
650.0000 mg | ORAL_TABLET | Freq: Once | ORAL | Status: AC | PRN
  Administered 2017-03-16: 650 mg via ORAL
  Filled 2017-03-16: qty 2

## 2017-03-16 MED ORDER — INSULIN ASPART 100 UNIT/ML ~~LOC~~ SOLN: 0.0000 [IU] | Freq: Every day | SUBCUTANEOUS | Status: DC

## 2017-03-16 MED ORDER — ROSUVASTATIN CALCIUM 10 MG PO TABS
10.0000 mg | ORAL_TABLET | Freq: Every day | ORAL | Status: DC
  Administered 2017-03-16 – 2017-03-23 (×8): 10 mg via ORAL
  Filled 2017-03-16 (×8): qty 1

## 2017-03-16 MED ORDER — LEVOTHYROXINE SODIUM 88 MCG PO TABS
88.0000 ug | ORAL_TABLET | Freq: Every day | ORAL | Status: DC
  Administered 2017-03-17 – 2017-03-21 (×5): 88 ug via ORAL
  Filled 2017-03-16 (×6): qty 1

## 2017-03-16 MED ORDER — BOOST / RESOURCE BREEZE PO LIQD CUSTOM
1.0000 | Freq: Three times a day (TID) | ORAL | Status: DC
  Administered 2017-03-17 – 2017-03-19 (×5): 1 via ORAL
  Filled 2017-03-16 (×3): qty 1

## 2017-03-16 MED ORDER — KETOROLAC TROMETHAMINE 15 MG/ML IJ SOLN
15.0000 mg | Freq: Four times a day (QID) | INTRAMUSCULAR | Status: AC | PRN
  Administered 2017-03-16 – 2017-03-19 (×5): 15 mg via INTRAVENOUS
  Filled 2017-03-16 (×5): qty 1

## 2017-03-16 MED ORDER — ROPINIROLE HCL 1 MG PO TABS
0.5000 mg | ORAL_TABLET | Freq: Three times a day (TID) | ORAL | Status: DC
  Administered 2017-03-16 – 2017-03-23 (×23): 0.5 mg via ORAL
  Filled 2017-03-16 (×25): qty 1

## 2017-03-16 MED ORDER — SODIUM CHLORIDE 0.9 % IV BOLUS (SEPSIS)
1000.0000 mL | Freq: Once | INTRAVENOUS | Status: AC
  Administered 2017-03-16: 1000 mL via INTRAVENOUS

## 2017-03-16 MED ORDER — SENNOSIDES-DOCUSATE SODIUM 8.6-50 MG PO TABS: 1.0000 | ORAL_TABLET | Freq: Every evening | ORAL | Status: DC | PRN

## 2017-03-16 MED ORDER — SODIUM CHLORIDE 0.9 % IV SOLN
INTRAVENOUS | Status: DC
  Administered 2017-03-16 – 2017-03-23 (×9): via INTRAVENOUS

## 2017-03-16 MED ORDER — ONDANSETRON HCL 4 MG PO TABS
4.0000 mg | ORAL_TABLET | Freq: Four times a day (QID) | ORAL | Status: DC | PRN
  Administered 2017-03-28: 4 mg via ORAL
  Filled 2017-03-16: qty 1

## 2017-03-16 MED ORDER — IRBESARTAN 150 MG PO TABS
150.0000 mg | ORAL_TABLET | Freq: Every day | ORAL | Status: DC
  Administered 2017-03-16 – 2017-03-23 (×8): 150 mg via ORAL
  Filled 2017-03-16 (×8): qty 1

## 2017-03-16 MED ORDER — ACETAMINOPHEN 650 MG RE SUPP: 650.0000 mg | Freq: Four times a day (QID) | RECTAL | Status: DC | PRN

## 2017-03-16 MED ORDER — PIPERACILLIN-TAZOBACTAM 3.375 G IVPB 30 MIN
3.3750 g | Freq: Once | INTRAVENOUS | Status: AC
  Administered 2017-03-16: 3.375 g via INTRAVENOUS
  Filled 2017-03-16: qty 50

## 2017-03-16 MED ORDER — ONDANSETRON HCL 4 MG/2ML IJ SOLN
4.0000 mg | Freq: Four times a day (QID) | INTRAMUSCULAR | Status: DC | PRN
  Administered 2017-03-16 – 2017-03-24 (×6): 4 mg via INTRAVENOUS
  Filled 2017-03-16 (×6): qty 2

## 2017-03-16 MED ORDER — ENOXAPARIN SODIUM 40 MG/0.4ML ~~LOC~~ SOLN
40.0000 mg | SUBCUTANEOUS | Status: DC
  Administered 2017-03-16 – 2017-03-22 (×7): 40 mg via SUBCUTANEOUS
  Filled 2017-03-16 (×8): qty 0.4

## 2017-03-16 MED ORDER — FLEET ENEMA 7-19 GM/118ML RE ENEM: 1.0000 | ENEMA | Freq: Once | RECTAL | Status: DC | PRN

## 2017-03-16 MED ORDER — INSULIN ASPART 100 UNIT/ML ~~LOC~~ SOLN: 0.0000 [IU] | Freq: Three times a day (TID) | SUBCUTANEOUS | Status: DC

## 2017-03-16 NOTE — H&P (Signed)
History and Physical    [name] YQI:[id number] DOB: October 02, 1950 DOA: 03/16/2017  PCP: Burnis Medin, MD Patient coming from: home  Chief Complaint: abd pain  HPI: [name] is a 67 y.o. female with medical history significant of essential hypertension, hypothyroidism restless leg syndrome came to the hospital with complaints of abdominal pain.  Patient states she has been suffering from abdominal pain for the past 6 weeks which originally started last week of December 2018.  At that time she was clinically diagnosed with diverticulitis which was confirmed on the CT scan 5 days later.  She was placed on 7 days of oral Cipro and Flagyl.  At first she did okay but later her pain persisted therefore she was also placed on Augmentin.  This time her pain return about 4 days ago and describes that lower abdominal sharp pain With feeling of nausea, denies vomiting fevers and chills.  Her last colonoscopy was 5 years ago which showed polyps and diverticulosis.  She has repeat colonoscopy later this year.  Due to this episode of diverticulitis last month she was scheduled for colonoscopy next month with outpatient gastroenterology.  In the ER today she had a CT of the abdomen pelvis done which showed persistence of her diverticulitis therefore being admitted for IV treatment.   Review of Systems: As per HPI otherwise 10 point review of systems negative.   Past Medical History:  Diagnosis Date  . Acute pyelonephritis 05/02/2012  . Asthma   . Dyspnea    nl PFTs, and echo  . GERD (gastroesophageal reflux disease)   . Headache(784.0)   . Heart murmur   . History of kidney stones   . IBS (irritable bowel syndrome)   . Nephrolithiasis   . Pre-diabetes 12/15  . Recurrent oral ulcers   . Thyroid goiter    I 131 rx and then  total throidectomy 2005 [facility]    Past Surgical History:  Procedure Laterality Date  . ABDOMINAL HYSTERECTOMY  1994   fibroid  . APPENDECTOMY  1968  . orif  left tivial plateau fracture     Dr. Collier Salina 2/08  . plates and pins take out of left knee  06/2008  . TONSILLECTOMY  1979  . TOTAL THYROIDECTOMY  2005   for  large nodule 2006     reports that  has never smoked. she has never used smokeless tobacco. She reports that she drinks alcohol. She reports that she does not use drugs.  Allergies  Allergen Reactions  . Ciprofloxacin Nausea Only  . Propofol Other (See Comments)    Agitated, combative  . Ritalin [Methylphenidate Hcl] Other (See Comments)    "climbed the walls"    Family History  Problem Relation Age of Onset  . COPD Sister   . Coronary artery disease Sister        age 9 also copd  . Cancer Mother        bladder  . Arthritis Other   . Cancer Other        breast  . Diabetes Other   . Hyperlipidemia Other   . Hypertension Other   . Stroke Other   . Heart disease Other   . Coronary artery disease Father        also had AAA  . Aneurysm Father        Aortic and thoracic fatal     Prior to Admission medications   Medication Sig Start Date End Date Taking? Authorizing Provider  acetaminophen (TYLENOL)  500 MG tablet Take 1,000 mg by mouth 3 (three) times daily.    Yes [provider]  amoxicillin-clavulanate (AUGMENTIN) 875-125 MG tablet Take 1 tablet by mouth 2 (two) times daily. 03/12/17  Yes [provider]  olmesartan (BENICAR) 20 MG tablet Take 1 tablet (20 mg total) by mouth daily. 10/06/16  Yes Panosh, Standley Brooking, MD  pantoprazole (PROTONIX) 40 MG tablet TAKE 1 TABLET (40 MG TOTAL) BY MOUTH 2 (TWO) TIMES DAILY. 02/06/16  Yes Panosh, Standley Brooking, MD  rOPINIRole (REQUIP) 0.5 MG tablet TAKE 1 TO 2 TABLETS BY MOUTH AT NOON, AND 5PM AND 3 TABLETS AT BEDTIME 10/07/16  Yes Panosh, Standley Brooking, MD  rosuvastatin (CRESTOR) 10 MG tablet TAKE 1 TABLET BY MOUTH EVERY DAY 03/02/17  Yes Panosh, Standley Brooking, MD  SYNTHROID 88 MCG tablet TAKE 1 TABLET BY MOUTH ONCE DAILY 02/22/17  Yes Panosh, Standley Brooking, MD  ciprofloxacin (CIPRO) 500  MG tablet Take 1 tablet (500 mg total) by mouth 2 (two) times daily. Patient not taking: Reported on 03/16/2017 01/29/17   Panosh, Standley Brooking, MD  metroNIDAZOLE (FLAGYL) 500 MG tablet Take 1 tablet (500 mg total) by mouth 3 (three) times daily. Patient not taking: Reported on 03/16/2017 01/29/17   Panosh, Standley Brooking, MD  ondansetron (ZOFRAN ODT) 4 MG disintegrating tablet Take 1 tablet (4 mg total) by mouth every 8 (eight) hours as needed for nausea or vomiting. Patient not taking: Reported on 03/16/2017 10/18/16   Franchot Heidelberg, PA-C    Physical Exam: Vitals:   03/16/17 1031 03/16/17 1244 03/16/17 1428 03/16/17 1654  BP:  92/60 115/74 137/81  Pulse:  70 75 71  Resp:  15 15 16   Temp:  98.6 F (37 C)    TempSrc:  Oral    SpO2:  94% 97% 97%  Weight: 76.2 kg (168 lb)     Height: 4' 11.5" (1.511 m)         Constitutional: NAD, calm, comfortable Vitals:   03/16/17 1031 03/16/17 1244 03/16/17 1428 03/16/17 1654  BP:  92/60 115/74 137/81  Pulse:  70 75 71  Resp:  15 15 16   Temp:  98.6 F (37 C)    TempSrc:  Oral    SpO2:  94% 97% 97%  Weight: 76.2 kg (168 lb)     Height: 4' 11.5" (1.511 m)      Eyes: PERRL, lids and conjunctivae normal ENMT: Mucous membranes are moist. Posterior pharynx clear of any exudate or lesions.Normal dentition.  Neck: normal, supple, no masses, no thyromegaly Respiratory: clear to auscultation bilaterally, no wheezing, no crackles. Normal respiratory effort. No accessory muscle use.  Cardiovascular: Regular rate and rhythm, no murmurs / rubs / gallops. No extremity edema. 2+ pedal pulses. No carotid bruits.  Abdomen: Lower part of abdomen is tender to deep palpation no masses palpated. No hepatosplenomegaly. Bowel sounds positive.  Musculoskeletal: no clubbing / cyanosis. No joint deformity upper and lower extremities. Good ROM, no contractures. Normal muscle tone.  Skin: no rashes, lesions, ulcers. No induration Neurologic: CN 2-12 grossly intact. Sensation  intact, DTR normal. Strength 5/5 in all 4.  Psychiatric: Normal judgment and insight. Alert and oriented x 3. Normal mood.     Labs on Admission: I have personally reviewed following labs and imaging studies  CBC: Recent Labs  Lab 03/16/17 1213  WBC 11.2*  HGB 13.4  HCT 39.4  MCV 95.4  PLT 025   Basic Metabolic Panel: Recent Labs  Lab 03/16/17 1213  NA  137  K 3.7  CL 101  CO2 23  GLUCOSE 105*  BUN 12  CREATININE 0.95  CALCIUM 9.1   GFR: Estimated Creatinine Clearance: 52.5 mL/min (by C-G formula based on SCr of 0.95 mg/dL). Liver Function Tests: Recent Labs  Lab 03/16/17 1213  AST 19  ALT 25  ALKPHOS 62  BILITOT 2.1*  PROT 7.4  ALBUMIN 4.2   Recent Labs  Lab 03/16/17 1213  LIPASE 24   No results for input(s): AMMONIA in the last 168 hours. Coagulation Profile: No results for input(s): INR, PROTIME in the last 168 hours. Cardiac Enzymes: No results for input(s): CKTOTAL, CKMB, CKMBINDEX, TROPONINI in the last 168 hours. BNP (last 3 results) No results for input(s): PROBNP in the last 8760 hours. HbA1C: No results for input(s): HGBA1C in the last 72 hours. CBG: No results for input(s): GLUCAP in the last 168 hours. Lipid Profile: No results for input(s): CHOL, HDL, LDLCALC, TRIG, CHOLHDL, LDLDIRECT in the last 72 hours. Thyroid Function Tests: No results for input(s): TSH, T4TOTAL, FREET4, T3FREE, THYROIDAB in the last 72 hours. Anemia Panel: No results for input(s): VITAMINB12, FOLATE, FERRITIN, TIBC, IRON, RETICCTPCT in the last 72 hours. Urine analysis:    Component Value Date/Time   COLORURINE AMBER (A) 03/16/2017 1325   APPEARANCEUR CLOUDY (A) 03/16/2017 1325   LABSPEC 1.025 03/16/2017 1325   PHURINE 5.0 03/16/2017 1325   GLUCOSEU NEGATIVE 03/16/2017 1325   HGBUR NEGATIVE 03/16/2017 1325   HGBUR negative 11/20/2009 0839   BILIRUBINUR NEGATIVE 03/16/2017 1325   BILIRUBINUR negative 01/29/2017 1035   BILIRUBINUR Neg 07/31/2016 0937    KETONESUR 5 (A) 03/16/2017 1325   PROTEINUR NEGATIVE 03/16/2017 1325   UROBILINOGEN 0.2 01/29/2017 1035   UROBILINOGEN 0.2 11/20/2009 0839   NITRITE NEGATIVE 03/16/2017 1325   LEUKOCYTESUR TRACE (A) 03/16/2017 1325   Sepsis Labs: !!!!!!!!!!!!!!!!!!!!!!!!!!!!!!!!!!!!!!!!!!!! @LABRCNTIP (procalcitonin:4,lacticidven:4) )No results found for this or any previous visit (from the past 240 hour(s)).   Radiological Exams on Admission: Ct Abdomen Pelvis W Contrast  Result Date: 03/16/2017 CLINICAL DATA:  History of diverticulitis being treated with antibiotics. Recurrent fever. EXAM: CT ABDOMEN AND PELVIS WITH CONTRAST TECHNIQUE: Multidetector CT imaging of the abdomen and pelvis was performed using the standard protocol following bolus administration of intravenous contrast. CONTRAST:  158mL ISOVUE-300 IOPAMIDOL (ISOVUE-300) INJECTION 61% COMPARISON:  01/29/2017.  05/10/2012. FINDINGS: Lower chest: Mild atelectasis/scarring at both lung bases. No pleural or pericardial fluid. Hepatobiliary: Fatty change of the liver.  No calcified gallstones. Pancreas: Normal Spleen: Normal Adrenals/Urinary Tract: Adrenal glands are normal. Kidneys are normal. Bladder is normal. Stomach/Bowel: No upper intestinal pathology. No abnormality of the colon until the descending colon where there is diverticulosis. There is acute diverticulitis in the sigmoid region with thickening and surrounding stranding. This has worsened compared to the study of January. I do not see a discrete abscess however. Vascular/Lymphatic: Aorta and IVC are normal. No retroperitoneal adenopathy. Reproductive: Previous hysterectomy.  No pelvic mass. Other: No free fluid or air. Musculoskeletal: Mild lumbar degenerative changes. IMPRESSION: Persistence and worsening of sigmoid diverticulitis with more regional fat stranding. No abscess, free fluid or free air is identified. Diffuse fatty change of the liver. Electronically Signed   By: Nelson Chimes M.D.    On: 03/16/2017 14:14      Assessment/Plan Active Problems:   Acute diverticulitis    Lower abdominal pain Acute sigmoid colitis - Patient has failed outpatient treatment with oral Flagyl and ciprofloxacin, Augmentin as well.  Will admit the patient for further  care and IV antibiotic -CT of the abdomen pelvis shows persistent/worsening of sigmoid diverticulitis without any evidence of abscess, perforation/free air -We will start the patient on IV Zosyn for now, IV fluids - Antiemetics as needed, pain control - Clear liquid diet and advance as tolerated - She will need outpatient colonoscopy in about 6-8 weeks  Mild urinary tract infection -UA is positive for mild UTI, will continue antibiotics as mentioned above. Send urine Cx  Hypothyroidism -Continue Synthroid  Essential hypertension -We will continue her home medications  Restless leg syndrome -Continue ropinirole  Hyperlipidemia -Continue Crestor 10 mg daily    DVT prophylaxis: Lovenox  Code Status: Full  Family Communication: Friend at bedside Disposition Plan:  TBD Consults called: None Admission status: Inpatient admission   Ankit Arsenio Loader MD [facility] Pager [phone]  If 7PM-7AM, please contact night-coverage [email or web address] Password [facility]  03/16/2017, 5:37 PM

## 2017-03-16 NOTE — ED Notes (Signed)
ED Hospitalist at bedside

## 2017-03-16 NOTE — Progress Notes (Signed)
Pharmacy Antibiotic Note  [name] is a 67 y.o. female admitted on 03/16/2017 with intra-abdominal infection.  Pharmacy has been consulted for zosyn dosing.  Plan: Zosyn 3.375mg  IV x 1 over 30 minutes then continue q8h extended interval Pharmacy will sign off  Height: 4' 11.5" (151.1 cm) Weight: 168 lb (76.2 kg) IBW/kg (Calculated) : 44.35  Temp (24hrs), Avg:99.6 F (37.6 C), Min:98.6 F (37 C), Max:100.6 F (38.1 C)  Recent Labs  Lab 03/16/17 1213  WBC 11.2*  CREATININE 0.95    Estimated Creatinine Clearance: 52.5 mL/min (by C-G formula based on SCr of 0.95 mg/dL).    Allergies  Allergen Reactions  . Ciprofloxacin Nausea Only  . Propofol Other (See Comments)    Agitated, combative  . Ritalin [Methylphenidate Hcl] Other (See Comments)    "climbed the walls"     Thank you for allowing pharmacy to be a part of this patient's care.  Dolly Rias RPh 03/16/2017, 4:28 PM Pager [phone]

## 2017-03-16 NOTE — ED Provider Notes (Signed)
[facility] DEPT Provider Note  CSN: [id number] Arrival date & time: 03/16/17 0920  Chief Complaint(s) Abdominal Pain and Fever  HPI [name] is a 67 y.o. female   The history is provided by the patient.  Abdominal Pain   This is a recurrent problem. Episode onset: 2 months. The problem occurs constantly. Progression since onset: fluctuating. Associated with: known diverticulitis. The pain is located in the LLQ. The quality of the pain is aching and dull. The pain is moderate. Pertinent negatives include anorexia, fever, diarrhea, nausea, vomiting, constipation and dysuria. The symptoms are aggravated by palpation and certain positions. Nothing relieves the symptoms.   Patient was initially placed on Cipro/Flagyl and reports some improvement in her pain but no complete resolution.  She went back to see her PCP who confirmed the diagnosis of diverticulitis and placed the patient on Augmentin.  She is currently in the middle of the course for Augmentin but reports that she has developed a fever and has not improved.   Past Medical History Past Medical History:  Diagnosis Date  . Acute pyelonephritis 05/02/2012  . Asthma   . Dyspnea    nl PFTs, and echo  . GERD (gastroesophageal reflux disease)   . Headache(784.0)   . Heart murmur   . History of kidney stones   . IBS (irritable bowel syndrome)   . Nephrolithiasis   . Pre-diabetes 12/15  . Recurrent oral ulcers   . Thyroid goiter    I 131 rx and then  total throidectomy 2005 [facility]   Patient Active Problem List   Diagnosis Date Noted  . Essential hypertension 08/17/2014  . Unspecified essential hypertension 08/01/2013  . Mild concussion 07/18/2013  . Head injury, unspecified 07/18/2013  . Back strain 07/18/2013  . Fall 07/18/2013  . Stress 06/26/2013  . Upper airway cough syndrome 06/15/2013  . Murmur 02/28/2013  . Chest discomfort 01/24/2013  . Dyspnea 01/24/2013  . Elevated blood  pressure reading 01/24/2013  . Stressful life event affecting family 01/24/2013  . Decreased exercise tolerance 01/24/2013  . Pre-diabetes 04/29/2012  . Abdominal swelling, left lower quadrant 04/29/2012  . Tingling in extremities 01/27/2012  . Cough, persistent 04/18/2011  . Sleep disturbance 11/23/2010  . Family history of premature coronary heart disease 11/23/2010  . Leg pain 11/17/2010  . Osteopenia 04/25/2010  . CATARACT, LEFT EYE 11/27/2009  . PURE HYPERCHOLESTEROLEMIA 01/29/2009  . OBESITY 01/12/2008  . DYSPNEA 01/12/2008  . ABDOMINAL ULTRASOUND, ABNORMAL 12/26/2007  . HYPERGLYCEMIA 12/14/2007  . LIVER FUNCTION TESTS, ABNORMAL 12/14/2007  . NEPHROLITHIASIS, HX OF 11/06/2007  . VITAMIN D DEFICIENCY 11/25/2006  . HYPOTHYROIDISM 10/04/2006  . HYPERLIPIDEMIA NEC/NOS 10/04/2006  . SYNDROME, RESTLESS LEGS 10/04/2006  . DISORDER, BONE/CARTILAGE NOS 10/04/2006  . WEIGHT GAIN 10/04/2006  . ELEVATION, TRANSAMINASE/LDH LEVELS 10/04/2006  . ASTHMA 09/30/2006  . GERD 09/30/2006   Home Medication(s) Prior to Admission medications   Medication Sig Start Date End Date Taking? Authorizing Provider  acetaminophen (TYLENOL) 500 MG tablet Take 1,000 mg by mouth 3 (three) times daily.    Yes [provider]  amoxicillin-clavulanate (AUGMENTIN) 875-125 MG tablet Take 1 tablet by mouth 2 (two) times daily. 03/12/17  Yes [provider]  olmesartan (BENICAR) 20 MG tablet Take 1 tablet (20 mg total) by mouth daily. 10/06/16  Yes Panosh, Standley Brooking, MD  pantoprazole (PROTONIX) 40 MG tablet TAKE 1 TABLET (40 MG TOTAL) BY MOUTH 2 (TWO) TIMES DAILY. 02/06/16  Yes Panosh, Standley Brooking, MD  rOPINIRole (REQUIP)  0.5 MG tablet TAKE 1 TO 2 TABLETS BY MOUTH AT NOON, AND 5PM AND 3 TABLETS AT BEDTIME 10/07/16  Yes Panosh, Standley Brooking, MD  rosuvastatin (CRESTOR) 10 MG tablet TAKE 1 TABLET BY MOUTH EVERY DAY 03/02/17  Yes Panosh, Standley Brooking, MD  SYNTHROID 88 MCG tablet TAKE 1 TABLET BY MOUTH ONCE DAILY 02/22/17   Yes Panosh, Standley Brooking, MD  ciprofloxacin (CIPRO) 500 MG tablet Take 1 tablet (500 mg total) by mouth 2 (two) times daily. Patient not taking: Reported on 03/16/2017 01/29/17   Panosh, Standley Brooking, MD  metroNIDAZOLE (FLAGYL) 500 MG tablet Take 1 tablet (500 mg total) by mouth 3 (three) times daily. Patient not taking: Reported on 03/16/2017 01/29/17   Panosh, Standley Brooking, MD  ondansetron (ZOFRAN ODT) 4 MG disintegrating tablet Take 1 tablet (4 mg total) by mouth every 8 (eight) hours as needed for nausea or vomiting. Patient not taking: Reported on 03/16/2017 10/18/16   Franchot Heidelberg, PA-C                                                                                                                                    Past Surgical History Past Surgical History:  Procedure Laterality Date  . ABDOMINAL HYSTERECTOMY  1994   fibroid  . APPENDECTOMY  1968  . orif left tivial plateau fracture     Dr. Collier Salina 2/08  . plates and pins take out of left knee  06/2008  . TONSILLECTOMY  1979  . TOTAL THYROIDECTOMY  2005   for  large nodule 2006   Family History Family History  Problem Relation Age of Onset  . COPD Sister   . Coronary artery disease Sister        age 30 also copd  . Cancer Mother        bladder  . Arthritis Other   . Cancer Other        breast  . Diabetes Other   . Hyperlipidemia Other   . Hypertension Other   . Stroke Other   . Heart disease Other   . Coronary artery disease Father        also had AAA  . Aneurysm Father        Aortic and thoracic fatal    Social History Social History   Tobacco Use  . Smoking status: Never Smoker  . Smokeless tobacco: Never Used  Substance Use Topics  . Alcohol use: Yes    Comment: seldom  . Drug use: No   Allergies Ciprofloxacin; Propofol; and Ritalin [methylphenidate hcl]  Review of Systems Review of Systems  Constitutional: Negative for fever.  Gastrointestinal: Positive for abdominal pain. Negative for anorexia, constipation,  diarrhea, nausea and vomiting.  Genitourinary: Negative for dysuria.   All other systems are reviewed and are negative for acute change except as noted in the HPI  Physical Exam Vital Signs  I have reviewed the triage vital signs  BP 137/81   Pulse 71   Temp 98.6 F (37 C) (Oral)   Resp 16   Ht 4' 11.5" (1.511 m)   Wt 76.2 kg (168 lb)   SpO2 97%   BMI 33.36 kg/m   Physical Exam  Constitutional: She is oriented to person, place, and time. She appears well-developed and well-nourished. No distress.  HENT:  Head: Normocephalic and atraumatic.  Nose: Nose normal.  Eyes: Conjunctivae and EOM are normal. Pupils are equal, round, and reactive to light. Right eye exhibits no discharge. Left eye exhibits no discharge. No scleral icterus.  Neck: Normal range of motion. Neck supple.  Cardiovascular: Normal rate and regular rhythm. Exam reveals no gallop and no friction rub.  No murmur heard. Pulmonary/Chest: Effort normal and breath sounds normal. No stridor. No respiratory distress. She has no rales.  Abdominal: Soft. She exhibits no distension. There is tenderness in the left lower quadrant. There is no rigidity, no rebound and no guarding. No hernia.  Musculoskeletal: She exhibits no edema or tenderness.  Neurological: She is alert and oriented to person, place, and time.  Skin: Skin is warm and dry. No rash noted. She is not diaphoretic. No erythema.  Psychiatric: She has a normal mood and affect.  Vitals reviewed.   ED Results and Treatments Labs (all labs ordered are listed, but only abnormal results are displayed) Labs Reviewed  COMPREHENSIVE METABOLIC PANEL - Abnormal; Notable for the following components:      Result Value   Glucose, Bld 105 (*)    Total Bilirubin 2.1 (*)    All other components within normal limits  CBC - Abnormal; Notable for the following components:   WBC 11.2 (*)    All other components within normal limits  URINALYSIS, ROUTINE W REFLEX MICROSCOPIC  - Abnormal; Notable for the following components:   Color, Urine AMBER (*)    APPearance CLOUDY (*)    Ketones, ur 5 (*)    Leukocytes, UA TRACE (*)    Bacteria, UA RARE (*)    Squamous Epithelial / LPF 6-30 (*)    All other components within normal limits  CULTURE, BLOOD (ROUTINE X 2)  CULTURE, BLOOD (ROUTINE X 2)  LIPASE, BLOOD                                                                                                                         EKG  EKG Interpretation  Date/Time:    Ventricular Rate:    PR Interval:    QRS Duration:   QT Interval:    QTC Calculation:   R Axis:     Text Interpretation:        Radiology Ct Abdomen Pelvis W Contrast  Result Date: 03/16/2017 CLINICAL DATA:  History of diverticulitis being treated with antibiotics. Recurrent fever. EXAM: CT ABDOMEN AND PELVIS WITH CONTRAST TECHNIQUE: Multidetector CT imaging of the abdomen and pelvis was performed using the standard protocol following bolus administration of intravenous contrast. CONTRAST:  163mL ISOVUE-300 IOPAMIDOL (ISOVUE-300) INJECTION 61% COMPARISON:  01/29/2017.  05/10/2012. FINDINGS: Lower chest: Mild atelectasis/scarring at both lung bases. No pleural or pericardial fluid. Hepatobiliary: Fatty change of the liver.  No calcified gallstones. Pancreas: Normal Spleen: Normal Adrenals/Urinary Tract: Adrenal glands are normal. Kidneys are normal. Bladder is normal. Stomach/Bowel: No upper intestinal pathology. No abnormality of the colon until the descending colon where there is diverticulosis. There is acute diverticulitis in the sigmoid region with thickening and surrounding stranding. This has worsened compared to the study of January. I do not see a discrete abscess however. Vascular/Lymphatic: Aorta and IVC are normal. No retroperitoneal adenopathy. Reproductive: Previous hysterectomy.  No pelvic mass. Other: No free fluid or air. Musculoskeletal: Mild lumbar degenerative changes. IMPRESSION:  Persistence and worsening of sigmoid diverticulitis with more regional fat stranding. No abscess, free fluid or free air is identified. Diffuse fatty change of the liver. Electronically Signed   By: Nelson Chimes M.D.   On: 03/16/2017 14:14   Pertinent labs & imaging results that were available during my care of the patient were reviewed by me and considered in my medical decision making (see chart for details).  Medications Ordered in ED Medications  piperacillin-tazobactam (ZOSYN) IVPB 3.375 g (3.375 g Intravenous New Bag/Given 03/16/17 1655)  piperacillin-tazobactam (ZOSYN) IVPB 3.375 g (not administered)  acetaminophen (TYLENOL) tablet 650 mg (650 mg Oral Given 03/16/17 1040)  iopamidol (ISOVUE-300) 61 % injection 100 mL (100 mLs Intravenous Contrast Given 03/16/17 1357)  sodium chloride 0.9 % bolus 1,000 mL (1,000 mLs Intravenous New Bag/Given 03/16/17 1659)                                                                                                                                    Procedures Procedures  (including critical care time)  Medical Decision Making / ED Course I have reviewed the nursing notes for this encounter and the patient's prior records (if available in EHR or on provided paperwork).    Workup confirming worsening diverticulitis on CT scan.  Patient has failed outpatient management and will require admission for IV antibiotics.  Case was discussed with hospitalist who will admit the patient for further management.  Final Clinical Impression(s) / ED Diagnoses Final diagnoses:  Diverticulitis      This chart was dictated using voice recognition software.  Despite best efforts to proofread,  errors can occur which can change the documentation meaning.   Fatima Blank, MD 03/16/17 [phone]

## 2017-03-16 NOTE — ED Triage Notes (Signed)
Patient reports that she was diagnosed with diverticulitis and was prescribed antibiotics. Patient states she began having fever again 5 days ago and was prescribed another round of antibiotics. Patient states that her temp this AM at home 101.7 this AM.

## 2017-03-16 NOTE — ED Notes (Signed)
ED TO INPATIENT HANDOFF REPORT  Name/Age/Gender [name] 67 y.o. female  Code Status    Code Status Orders  (From admission, onward)        Start     Ordered   03/16/17 1724  Full code  Continuous     03/16/17 1729    Code Status History    Date Active Date Inactive Code Status Order ID Comments User Context   This patient has a current code status but no historical code status.    Advance Directive Documentation     Most Recent Value  Type of Advance Directive  Healthcare Power of Attorney, Living will  Pre-existing out of facility DNR order (yellow form or pink MOST form)  No data  "MOST" Form in Place?  No data      Home/SNF/Other Home  Chief Complaint stomach pain with fever  Level of Care/Admitting Diagnosis ED Disposition    ED Disposition Condition Comment   Admit  Hospital Area: [facility] [[id number]]  Level of Care: Med-Surg [16]  Diagnosis: Acute diverticulitis [8099833]  Admitting Physician: Gerlean Ren [facility] [[id number]]  Attending Physician: Gerlean Ren [facility] [[id number]]  Estimated length of stay: past midnight tomorrow  Certification:: I certify this patient will need inpatient services for at least 2 midnights  PT Class (Do Not Modify): Inpatient [101]  PT Acc Code (Do Not Modify): Private [1]       Medical History Past Medical History:  Diagnosis Date  . Acute pyelonephritis 05/02/2012  . Asthma   . Dyspnea    nl PFTs, and echo  . GERD (gastroesophageal reflux disease)   . Headache(784.0)   . Heart murmur   . History of kidney stones   . IBS (irritable bowel syndrome)   . Nephrolithiasis   . Pre-diabetes 12/15  . Recurrent oral ulcers   . Thyroid goiter    I 131 rx and then  total throidectomy 2005 [facility]    Allergies Allergies  Allergen Reactions  . Ciprofloxacin Nausea Only  . Propofol Other (See Comments)    Agitated, combative  . Ritalin [Methylphenidate Hcl] Other (See Comments)    "climbed the  walls"    IV Location/Drains/Wounds Patient Lines/Drains/Airways Status   Active Line/Drains/Airways    Name:   Placement date:   Placement time:   Site:   Days:   Peripheral IV 03/16/17 Right Antecubital   03/16/17    1653    Antecubital   less than 1          Labs/Imaging Results for orders placed or performed during the hospital encounter of 03/16/17 (from the past 48 hour(s))  Lipase, blood     Status: None   Collection Time: 03/16/17 12:13 PM  Result Value Ref Range   Lipase 24 11 - 51 U/L    Comment: Performed at [facility], [place] [address], [place], [place] [address]  Comprehensive metabolic panel     Status: Abnormal   Collection Time: 03/16/17 12:13 PM  Result Value Ref Range   Sodium 137 135 - 145 mmol/L   Potassium 3.7 3.5 - 5.1 mmol/L   Chloride 101 101 - 111 mmol/L   CO2 23 22 - 32 mmol/L   Glucose, Bld 105 (H) 65 - 99 mg/dL   BUN 12 6 - 20 mg/dL   Creatinine, Ser 0.95 0.44 - 1.00 mg/dL   Calcium 9.1 8.9 - 10.3 mg/dL   Total Protein 7.4 6.5 - 8.1 g/dL   Albumin  4.2 3.5 - 5.0 g/dL   AST 19 15 - 41 U/L   ALT 25 14 - 54 U/L   Alkaline Phosphatase 62 38 - 126 U/L   Total Bilirubin 2.1 (H) 0.3 - 1.2 mg/dL   GFR calc non Af Amer >60 >60 mL/min   GFR calc Af Amer >60 >60 mL/min    Comment: (NOTE) The eGFR has been calculated using the CKD EPI equation. This calculation has not been validated in all clinical situations. eGFR's persistently <60 mL/min signify possible Chronic Kidney Disease.    Anion gap 13 5 - 15    Comment: Performed at [facility], [place] [address] [place], [place] [address]  CBC     Status: Abnormal   Collection Time: 03/16/17 12:13 PM  Result Value Ref Range   WBC 11.2 (H) 4.0 - 10.5 K/uL   RBC 4.13 3.87 - 5.11 MIL/uL   Hemoglobin 13.4 12.0 - 15.0 g/dL   HCT 39.4 36.0 - 46.0 %   MCV 95.4 78.0 - 100.0 fL   MCH 32.4 26.0 - 34.0 pg   MCHC 34.0 30.0 - 36.0 g/dL   RDW 13.4 11.5 - 15.5 %   Platelets  251 150 - 400 K/uL    Comment: Performed at [facility], [place] [address], [place], [place] [address]  Urinalysis, Routine w reflex microscopic     Status: Abnormal   Collection Time: 03/16/17  1:25 PM  Result Value Ref Range   Color, Urine AMBER (A) YELLOW    Comment: BIOCHEMICALS MAY BE AFFECTED BY COLOR   APPearance CLOUDY (A) CLEAR   Specific Gravity, Urine 1.025 1.005 - 1.030   pH 5.0 5.0 - 8.0   Glucose, UA NEGATIVE NEGATIVE mg/dL   Hgb urine dipstick NEGATIVE NEGATIVE   Bilirubin Urine NEGATIVE NEGATIVE   Ketones, ur 5 (A) NEGATIVE mg/dL   Protein, ur NEGATIVE NEGATIVE mg/dL   Nitrite NEGATIVE NEGATIVE   Leukocytes, UA TRACE (A) NEGATIVE   RBC / HPF 0-5 0 - 5 RBC/hpf   WBC, UA 6-30 0 - 5 WBC/hpf   Bacteria, UA RARE (A) NONE SEEN   Squamous Epithelial / LPF 6-30 (A) NONE SEEN   Mucus PRESENT     Comment: Performed at [facility], [place] [address] [place], [place] [address]  CBC     Status: Abnormal   Collection Time: 03/16/17  6:25 PM  Result Value Ref Range   WBC 11.5 (H) 4.0 - 10.5 K/uL   RBC 3.73 (L) 3.87 - 5.11 MIL/uL   Hemoglobin 12.3 12.0 - 15.0 g/dL   HCT 35.8 (L) 36.0 - 46.0 %   MCV 96.0 78.0 - 100.0 fL   MCH 33.0 26.0 - 34.0 pg   MCHC 34.4 30.0 - 36.0 g/dL   RDW 13.7 11.5 - 15.5 %   Platelets 232 150 - 400 K/uL    Comment: Performed at [facility], [place] [address], [place],  [address]  Creatinine, serum     Status: None   Collection Time: 03/16/17  6:25 PM  Result Value Ref Range   Creatinine, Ser 0.86 0.44 - 1.00 mg/dL   GFR calc non Af Amer >60 >60 mL/min   GFR calc Af Amer >60 >60 mL/min    Comment: (NOTE) The eGFR has been calculated using the CKD EPI equation. This calculation has not been validated in all clinical situations. eGFR's persistently <60 mL/min signify possible Chronic Kidney Disease. Performed at [facility]  [place] [address] [place], [place] [address]     Ct Abdomen Pelvis W Contrast  Result Date: 03/16/2017 CLINICAL DATA:  History of diverticulitis being treated with antibiotics. Recurrent fever. EXAM: CT ABDOMEN AND PELVIS WITH CONTRAST TECHNIQUE: Multidetector CT imaging of the abdomen and pelvis was performed using the standard protocol following bolus administration of intravenous contrast. CONTRAST:  123m ISOVUE-300 IOPAMIDOL (ISOVUE-300) INJECTION 61% COMPARISON:  01/29/2017.  05/10/2012. FINDINGS: Lower chest: Mild atelectasis/scarring at both lung bases. No pleural or pericardial fluid. Hepatobiliary: Fatty change of the liver.  No calcified gallstones. Pancreas: Normal Spleen: Normal Adrenals/Urinary Tract: Adrenal glands are normal. Kidneys are normal. Bladder is normal. Stomach/Bowel: No upper intestinal pathology. No abnormality of the colon until the descending colon where there is diverticulosis. There is acute diverticulitis in the sigmoid region with thickening and surrounding stranding. This has worsened compared to the study of January. I do not see a discrete abscess however. Vascular/Lymphatic: Aorta and IVC are normal. No retroperitoneal adenopathy. Reproductive: Previous hysterectomy.  No pelvic mass. Other: No free fluid or air. Musculoskeletal: Mild lumbar degenerative changes. IMPRESSION: Persistence and worsening of sigmoid diverticulitis with more regional fat stranding. No abscess, free fluid or free air is identified. Diffuse fatty change of the liver. Electronically Signed   By: MNelson ChimesM.D.   On: 03/16/2017 14:14    Pending Labs Unresulted Labs (From admission, onward)   Start     Ordered   03/23/17 0500  Creatinine, serum  (enoxaparin (LOVENOX)    CrCl >/= 30 ml/min)  Weekly,   R    Comments:  while on enoxaparin therapy    03/16/17 1729   03/17/17 0500  Comprehensive metabolic panel  Tomorrow morning,   R     03/16/17 1729   03/17/17 0500  CBC  Tomorrow morning,   R     03/16/17 1729   03/17/17 0500   Protime-INR  Tomorrow morning,   R     03/16/17 1729   03/17/17 0500  APTT  Tomorrow morning,   R     03/16/17 1729   03/16/17 1753  Culture, Urine  Once,   R     03/16/17 1752   03/16/17 1615  Blood culture (routine x 2)  BLOOD CULTURE X 2,   STAT     03/16/17 1614      Vitals/Pain Today's Vitals   03/16/17 1655 03/16/17 1901 03/16/17 1902 03/16/17 2002  BP:  106/69  108/65  Pulse:  69  64  Resp:  16  18  Temp:    98.1 F (36.7 C)  TempSrc:    Oral  SpO2:  96%  95%  Weight:      Height:      PainSc: 6   4  3      Isolation Precautions No active isolations  Medications Medications  piperacillin-tazobactam (ZOSYN) IVPB 3.375 g (not administered)  acetaminophen (TYLENOL) tablet 1,000 mg (1,000 mg Oral Not Given 03/16/17 1806)  pantoprazole (PROTONIX) EC tablet 40 mg (40 mg Oral Given 03/16/17 1802)  rOPINIRole (REQUIP) tablet 0.5 mg (0.5 mg Oral Given 03/16/17 1802)  irbesartan (AVAPRO) tablet 150 mg (150 mg Oral Given 03/16/17 1801)  levothyroxine (SYNTHROID, LEVOTHROID) tablet 88 mcg (not administered)  rosuvastatin (CRESTOR) tablet 10 mg (10 mg Oral Given 03/16/17 1801)  enoxaparin (LOVENOX) injection 40 mg (not administered)  0.9 %  sodium chloride infusion (not administered)  acetaminophen (TYLENOL) tablet 650 mg (not administered)    Or  acetaminophen (TYLENOL) suppository 650 mg (not administered)  HYDROcodone-acetaminophen (NORCO/VICODIN) 5-325 MG per tablet 1-2 tablet (1 tablet Oral Given 03/16/17 1801)  ketorolac (TORADOL) 15 MG/ML injection 15 mg (not administered)  senna-docusate (Senokot-S) tablet 1 tablet (not administered)  bisacodyl (DULCOLAX) EC tablet 5 mg (not administered)  sodium phosphate (FLEET) 7-19 GM/118ML enema 1 enema (not administered)  ondansetron (ZOFRAN) tablet 4 mg (not administered)    Or  ondansetron (ZOFRAN) injection 4 mg (not administered)  insulin aspart (novoLOG) injection 0-9 Units (not administered)  insulin aspart (novoLOG)  injection 0-5 Units (not administered)  acetaminophen (TYLENOL) tablet 650 mg (650 mg Oral Given 03/16/17 1040)  iopamidol (ISOVUE-300) 61 % injection 100 mL (100 mLs Intravenous Contrast Given 03/16/17 1357)  sodium chloride 0.9 % bolus 1,000 mL (0 mLs Intravenous Stopped 03/16/17 1807)  piperacillin-tazobactam (ZOSYN) IVPB 3.375 g (0 g Intravenous Stopped 03/16/17 1737)    Mobility walks

## 2017-03-16 NOTE — ED Notes (Signed)
Pt aware that we need another urine sample in order to send urine culture.

## 2017-03-16 NOTE — ED Notes (Signed)
Pt had drawn for labs:  Red Gold Blue Lavender Lt green Dark green x2

## 2017-03-17 DIAGNOSIS — I10 Essential (primary) hypertension: Secondary | ICD-10-CM

## 2017-03-17 LAB — COMPREHENSIVE METABOLIC PANEL
ALBUMIN: 3.5 g/dL (ref 3.5–5.0)
ALK PHOS: 48 U/L (ref 38–126)
ALT: 21 U/L (ref 14–54)
ANION GAP: 10 (ref 5–15)
AST: 15 U/L (ref 15–41)
BILIRUBIN TOTAL: 2.1 mg/dL — AB (ref 0.3–1.2)
BUN: 14 mg/dL (ref 6–20)
CALCIUM: 8.6 mg/dL — AB (ref 8.9–10.3)
CO2: 25 mmol/L (ref 22–32)
CREATININE: 0.96 mg/dL (ref 0.44–1.00)
Chloride: 105 mmol/L (ref 101–111)
GFR calc Af Amer: >60 mL/min (ref >60)
GFR calc non Af Amer: >60 mL/min (ref >60)
GLUCOSE: 98 mg/dL (ref 65–99)
Potassium: 3.7 mmol/L (ref 3.5–5.1)
SODIUM: 140 mmol/L (ref 135–145)
TOTAL PROTEIN: 6.4 g/dL — AB (ref 6.5–8.1)

## 2017-03-17 LAB — CBC
HCT: 33.7 % — ABNORMAL LOW (ref 36.0–46.0)
HEMOGLOBIN: 11.4 g/dL — AB (ref 12.0–15.0)
MCH: 32.8 pg (ref 26.0–34.0)
MCHC: 33.8 g/dL (ref 30.0–36.0)
MCV: 96.8 fL (ref 78.0–100.0)
PLATELETS: 219 10*3/uL (ref 150–400)
RBC: 3.48 MIL/uL — ABNORMAL LOW (ref 3.87–5.11)
RDW: 14 % (ref 11.5–15.5)
WBC: 7.4 10*3/uL (ref 4.0–10.5)

## 2017-03-17 LAB — GLUCOSE, CAPILLARY
GLUCOSE-CAPILLARY: 76 mg/dL (ref 65–99)
Glucose-Capillary: 89 mg/dL (ref 65–99)
Glucose-Capillary: 78 mg/dL (ref 65–99)
Glucose-Capillary: 78 mg/dL (ref 65–99)

## 2017-03-17 LAB — PROTIME-INR
INR: 1.12
PROTHROMBIN TIME: 14.3 s (ref 11.4–15.2)

## 2017-03-17 LAB — APTT: aPTT: 38 seconds — ABNORMAL HIGH (ref 24–36)

## 2017-03-17 NOTE — Progress Notes (Signed)
[facility] PROGRESS NOTE  [name] ZCH:[id number] DOB: [birth year] DOA: 03/16/2017  PCP: Burnis Medin, MD  Brief History/Interval Summary: 67 year old Caucasian female with a past medical history of essential hypertension, hypothyroidism, restless leg syndrome presented with abdominal pain.  Colitis end of December.  She underwent a CT scan in early January which cannot rule out a soft tissue density in that area.  She was treated with a prolonged course of antibiotics with Cipro and Flagyl.  She did feel better however her symptoms recurred last week.  She was placed on Augmentin by her gastroenterologist, Dr. Earlean Shawl.  However subsequently developed a fever and so was asked to come into the hospital.  She was hospitalized and placed on Zosyn.  Reason for Visit: Acute recurrent diverticulitis  Consultants: None yet  Procedures: None  Antibiotics: Zosyn  Subjective/Interval History: Patient continues to have pain in her lower abdomen left more than right.  Some nausea but no vomiting.  Passing gas.  No diarrhea.  ROS: No shortness of breath  Objective:  Vital Signs  Vitals:   03/16/17 2002 03/16/17 2145 03/17/17 0626 03/17/17 0800  BP: 108/65 (!) 108/58 117/68 129/78  Pulse: 64 61 (!) 55 78  Resp: 18 20 20 16   Temp: 98.1 F (36.7 C) 98.4 F (36.9 C) 97.8 F (36.6 C) 97.9 F (36.6 C)  TempSrc: Oral Oral Oral Oral  SpO2: 95% 95% 98% 96%  Weight:   76.2 kg (167 lb 15.9 oz)   Height:        Intake/Output Summary (Last 24 hours) at 03/17/2017 1204 Last data filed at 03/17/2017 0422 Gross per 24 hour  Intake 1817.5 ml  Output 200 ml  Net 1617.5 ml   Filed Weights   03/16/17 1031 03/17/17 0626  Weight: 76.2 kg (168 lb) 76.2 kg (167 lb 15.9 oz)    General appearance: alert, cooperative, appears stated age and no distress Head: Normocephalic, without obvious abnormality, atraumatic Resp: clear to auscultation bilaterally Cardio: regular rate and  rhythm, S1, S2 normal, no murmur, click, rub or gallop GI: Abdomen is tender to palpation in the left lower quadrant without any rebound rigidity or guarding.  No masses organomegaly.  Bowel sounds are present. Extremities: extremities normal, atraumatic, no cyanosis or edema Neurologic: No obvious focal neurological deficits.  Cranial nerves II to XII intact.  Lab Results:  Data Reviewed: I have personally reviewed following labs and imaging studies  CBC: Recent Labs  Lab 03/16/17 1213 03/16/17 1825 03/17/17 0630  WBC 11.2* 11.5* 7.4  HGB 13.4 12.3 11.4*  HCT 39.4 35.8* 33.7*  MCV 95.4 96.0 96.8  PLT 251 232 287    Basic Metabolic Panel: Recent Labs  Lab 03/16/17 1213 03/16/17 1825 03/17/17 0630  NA 137  --  140  K 3.7  --  3.7  CL 101  --  105  CO2 23  --  25  GLUCOSE 105*  --  98  BUN 12  --  14  CREATININE 0.95 0.86 0.96  CALCIUM 9.1  --  8.6*    GFR: Estimated Creatinine Clearance: 52 mL/min (by C-G formula based on SCr of 0.96 mg/dL).  Liver Function Tests: Recent Labs  Lab 03/16/17 1213 03/17/17 0630  AST 19 15  ALT 25 21  ALKPHOS 62 48  BILITOT 2.1* 2.1*  PROT 7.4 6.4*  ALBUMIN 4.2 3.5    Recent Labs  Lab 03/16/17 1213  LIPASE 24    Coagulation Profile: Recent Labs  Lab 03/17/17  0630  INR 1.12    CBG: Recent Labs  Lab 03/16/17 2149 03/17/17 0814  GLUCAP 102* 89     Radiology Studies: Ct Abdomen Pelvis W Contrast  Result Date: 03/16/2017 CLINICAL DATA:  History of diverticulitis being treated with antibiotics. Recurrent fever. EXAM: CT ABDOMEN AND PELVIS WITH CONTRAST TECHNIQUE: Multidetector CT imaging of the abdomen and pelvis was performed using the standard protocol following bolus administration of intravenous contrast. CONTRAST:  128mL ISOVUE-300 IOPAMIDOL (ISOVUE-300) INJECTION 61% COMPARISON:  01/29/2017.  05/10/2012. FINDINGS: Lower chest: Mild atelectasis/scarring at both lung bases. No pleural or pericardial fluid.  Hepatobiliary: Fatty change of the liver.  No calcified gallstones. Pancreas: Normal Spleen: Normal Adrenals/Urinary Tract: Adrenal glands are normal. Kidneys are normal. Bladder is normal. Stomach/Bowel: No upper intestinal pathology. No abnormality of the colon until the descending colon where there is diverticulosis. There is acute diverticulitis in the sigmoid region with thickening and surrounding stranding. This has worsened compared to the study of January. I do not see a discrete abscess however. Vascular/Lymphatic: Aorta and IVC are normal. No retroperitoneal adenopathy. Reproductive: Previous hysterectomy.  No pelvic mass. Other: No free fluid or air. Musculoskeletal: Mild lumbar degenerative changes. IMPRESSION: Persistence and worsening of sigmoid diverticulitis with more regional fat stranding. No abscess, free fluid or free air is identified. Diffuse fatty change of the liver. Electronically Signed   By: Nelson Chimes M.D.   On: 03/16/2017 14:14     Medications:  Scheduled: . acetaminophen  1,000 mg Oral TID  . enoxaparin (LOVENOX) injection  40 mg Subcutaneous Q24H  . feeding supplement  1 Container Oral TID BM  . insulin aspart  0-5 Units Subcutaneous QHS  . insulin aspart  0-9 Units Subcutaneous TID WC  . irbesartan  150 mg Oral Daily  . levothyroxine  88 mcg Oral QAC breakfast  . pantoprazole  40 mg Oral Daily  . rOPINIRole  0.5 mg Oral TID  . rosuvastatin  10 mg Oral Daily   Continuous: . sodium chloride 75 mL/hr at 03/16/17 2200  . piperacillin-tazobactam (ZOSYN)  IV 3.375 g (03/17/17 0815)   DPO:[id number] **OR** acetaminophen, bisacodyl, HYDROcodone-acetaminophen, ketorolac, ondansetron **OR** ondansetron (ZOFRAN) IV, senna-docusate, sodium phosphate  Assessment/Plan:  Active Problems:   Acute diverticulitis    Acute recurrent sigmoid diverticulitis This is patient's second episode of same within the last month and a half.  She failed outpatient treatment with  Augmentin.  Currently on IV Zosyn.  She is usually followed by Dr. Earlean Shawl,  Gastroenterologist.  She was supposed to see him today but was hospitalized last night.  We will discuss with him regarding further options.  CT scan findings reviewed.  Continue clear liquid diet.  Mild urinary tract infection UA noted to be positive for mild UTI.  Continue antibiotics as mentioned above.  A follow-up on urine cultures.  History of hypothyroidism Continue Synthroid.  Essential hypertension Continue home medications. Monitor blood pressures closely  Restless leg syndrome Continue ropinirole  History of hyperlipidemia Continue home medications.  DVT Prophylaxis: Lovenox    Code Status: Full code Family Communication: Discussed with the patient Disposition Plan: Management as outlined above.     LOS: 1 day   [facility] Pager [phone] 03/17/2017, 12:04 PM  If 7PM-7AM, please contact night-coverage at [email or web address], password [facility]

## 2017-03-17 NOTE — Consult Note (Signed)
Reason for Consult:  Recurrent diverticulitis Referring Physician: Dr. Maryland Pink, G PCP: Burnis Medin, MD GI:Dr. [facility] [name] is an 67 y.o. female.   HPI: Pt seen 01/25/17 by Dr. Burnice Logan for diverticulitis.  She had a 2-day history of abdominal pain and a low-grade fever at that time.  She was also 2 weeks status post right knee arthroscopic surgery.  She was placed on antibiotic therapy at that time.  She was seen again on 01/29/17 by Dr. Regis Bill.  She reported 10 pound weight loss ongoing pain.  She was given additional 5 days of antibiotics and scheduled for a CT scan. CT scan obtained on 01/29/17, showed circumferential mucosal thickening of the sigmoid colon with pericolonic and laboratory changes findings likely due to diverticulitis however a soft tissue mass could not be excluded.  She was seen again on 02/03/17 and her diverticular symptoms were improving and she was referred to Dr. Earlean Shawl for colonoscopy.  She was seen again on 03/09/17 for her annual physical/health check.  There were no new recommendations regarding her diverticulitis.  She was supposed to be seen for her restless leg syndrome by neurology and Dr. Beacher May.  Yesterday she presented to the ED with a complaint of fever up to 101.7 and pain over the last 5 days. She was started on a second round of antibiotics.  Her initial course of antibiotics was Cipro and Flagyl.  She was currently started on a second course of antibiotics using Augmentin about 03/11/17.  She has had Diverticulitis in the past but cannot remember when that was; many years ago.    Workup in the ED shows she is afebrile vital signs are stable.  WBC is 11.5, hemoglobin 12, hematocrit 35.8, platelets 232,000.  CMP was unremarkable except for total bilirubin 2.1 and a glucose of 105.  Urinalysis was negative. CT of the abdomen and pelvis with contrast yesterday shows mild atelectasis and scarring in both lung bases.  There is no intestinal pathology and  no abnormality colon until the descending colon where there is diverticulosis there is acute diverticulosis in the region of the sigmoid with thickening and surrounding stranding.  This is worsening compared to the study in January but no abscess was noted.  There is no free air or free fluid identified either.  Patient has diffuse fatty changes of her liver.  Patient's last colonoscopy was approximately 5 years ago, she was to see Dr. Earlean Shawl today and be scheduled for repeat colonoscopy.  Unfortunately she was admitted to the hospital.  She has been started on Zosyn and overnight her white count has improved and is currently down to 7.4.  She has previously reported 2 prior episodes of diverticulitis before in January 2018.  Dr. Pricilla Handler has discussed this with Dr. Earlean Shawl and they would like surgical consult.   Past Medical History:  Diagnosis Date  . Acute pyelonephritis 05/02/2012  . Asthma   . Dyspnea    nl PFTs, and echo  . GERD (gastroesophageal reflux disease)   . Headache(784.0)   . Heart murmur   . History of kidney stones   . IBS (irritable bowel syndrome)   . Nephrolithiasis   . Pre-diabetes 12/15  . Recurrent oral ulcers   . Thyroid goiter    I 131 rx and then  total throidectomy 2005 [facility]    Past Surgical History:  Procedure Laterality Date  . ABDOMINAL HYSTERECTOMY  1994   fibroid  . APPENDECTOMY  1968  . orif left tivial  plateau fracture     Dr. Collier Salina 2/08  . plates and pins take out of left knee  06/2008  . TONSILLECTOMY  1979  . TOTAL THYROIDECTOMY  2005   for  large nodule 2006    Family History  Problem Relation Age of Onset  . COPD Sister   . Coronary artery disease Sister        age 41 also copd  . Cancer Mother        bladder  . Arthritis Other   . Cancer Other        breast  . Diabetes Other   . Hyperlipidemia Other   . Hypertension Other   . Stroke Other   . Heart disease Other   . Coronary artery disease Father        also had AAA  .  Aneurysm Father        Aortic and thoracic fatal    Social History:  reports that  has never smoked. she has never used smokeless tobacco. She reports that she drinks alcohol. She reports that she does not use drugs.  Allergies:  Allergies  Allergen Reactions  . Ciprofloxacin Nausea Only  . Propofol Other (See Comments)    Agitated, combative  . Ritalin [Methylphenidate Hcl] Other (See Comments)    "climbed the walls"    Medications:  Prior to Admission:  Medications Prior to Admission  Medication Sig Dispense Refill Last Dose  . acetaminophen (TYLENOL) 500 MG tablet Take 1,000 mg by mouth 3 (three) times daily.    03/15/2017 at Unknown time  . amoxicillin-clavulanate (AUGMENTIN) 875-125 MG tablet Take 1 tablet by mouth 2 (two) times daily.  2 03/15/2017 at Unknown time  . olmesartan (BENICAR) 20 MG tablet Take 1 tablet (20 mg total) by mouth daily. 90 tablet 1 03/15/2017 at Unknown time  . pantoprazole (PROTONIX) 40 MG tablet TAKE 1 TABLET (40 MG TOTAL) BY MOUTH 2 (TWO) TIMES DAILY. 180 tablet 1 03/15/2017 at Unknown time  . rOPINIRole (REQUIP) 0.5 MG tablet TAKE 1 TO 2 TABLETS BY MOUTH AT NOON, AND 5PM AND 3 TABLETS AT BEDTIME 540 tablet 1 03/16/2017 at Unknown time  . rosuvastatin (CRESTOR) 10 MG tablet TAKE 1 TABLET BY MOUTH EVERY DAY 30 tablet 2 03/15/2017 at Unknown time  . SYNTHROID 88 MCG tablet TAKE 1 TABLET BY MOUTH ONCE DAILY 90 tablet 3 03/15/2017 at Unknown time  . ciprofloxacin (CIPRO) 500 MG tablet Take 1 tablet (500 mg total) by mouth 2 (two) times daily. (Patient not taking: Reported on 03/16/2017) 10 tablet 0 Not Taking at Unknown time  . metroNIDAZOLE (FLAGYL) 500 MG tablet Take 1 tablet (500 mg total) by mouth 3 (three) times daily. (Patient not taking: Reported on 03/16/2017) 15 tablet 0 Not Taking at Unknown time  . ondansetron (ZOFRAN ODT) 4 MG disintegrating tablet Take 1 tablet (4 mg total) by mouth every 8 (eight) hours as needed for nausea or vomiting. (Patient not  taking: Reported on 03/16/2017) 20 tablet 0 Not Taking at Unknown time   Scheduled: . acetaminophen  1,000 mg Oral TID  . enoxaparin (LOVENOX) injection  40 mg Subcutaneous Q24H  . feeding supplement  1 Container Oral TID BM  . insulin aspart  0-5 Units Subcutaneous QHS  . insulin aspart  0-9 Units Subcutaneous TID WC  . irbesartan  150 mg Oral Daily  . levothyroxine  88 mcg Oral QAC breakfast  . pantoprazole  40 mg Oral Daily  . rOPINIRole  0.5  mg Oral TID  . rosuvastatin  10 mg Oral Daily   Continuous: . sodium chloride 75 mL/hr at 03/16/17 2200  . piperacillin-tazobactam (ZOSYN)  IV Stopped (03/17/17 1219)   Anti-infectives (From admission, onward)   Start     Dose/Rate Route Frequency Ordered Stop   03/17/17 0000  piperacillin-tazobactam (ZOSYN) IVPB 3.375 g     3.375 g 12.5 mL/hr over 240 Minutes Intravenous Every 8 hours 03/16/17 1626     03/16/17 1630  piperacillin-tazobactam (ZOSYN) IVPB 3.375 g     3.375 g 100 mL/hr over 30 Minutes Intravenous  Once 03/16/17 1626 03/16/17 1737      Results for orders placed or performed during the hospital encounter of 03/16/17 (from the past 48 hour(s))  Lipase, blood     Status: None   Collection Time: 03/16/17 12:13 PM  Result Value Ref Range   Lipase 24 11 - 51 U/L    Comment: Performed at [facility], [place] [address] [place], [place] [address]  Comprehensive metabolic panel     Status: Abnormal   Collection Time: 03/16/17 12:13 PM  Result Value Ref Range   Sodium 137 135 - 145 mmol/L   Potassium 3.7 3.5 - 5.1 mmol/L   Chloride 101 101 - 111 mmol/L   CO2 23 22 - 32 mmol/L   Glucose, Bld 105 (H) 65 - 99 mg/dL   BUN 12 6 - 20 mg/dL   Creatinine, Ser 0.95 0.44 - 1.00 mg/dL   Calcium 9.1 8.9 - 10.3 mg/dL   Total Protein 7.4 6.5 - 8.1 g/dL   Albumin 4.2 3.5 - 5.0 g/dL   AST 19 15 - 41 U/L   ALT 25 14 - 54 U/L   Alkaline Phosphatase 62 38 - 126 U/L   Total Bilirubin 2.1 (H) 0.3 - 1.2 mg/dL   GFR calc non  Af Amer >60 >60 mL/min   GFR calc Af Amer >60 >60 mL/min    Comment: (NOTE) The eGFR has been calculated using the CKD EPI equation. This calculation has not been validated in all clinical situations. eGFR's persistently <60 mL/min signify possible Chronic Kidney Disease.    Anion gap 13 5 - 15    Comment: Performed at [facility], [place] [address] [place], [place] [address]  CBC     Status: Abnormal   Collection Time: 03/16/17 12:13 PM  Result Value Ref Range   WBC 11.2 (H) 4.0 - 10.5 K/uL   RBC 4.13 3.87 - 5.11 MIL/uL   Hemoglobin 13.4 12.0 - 15.0 g/dL   HCT 39.4 36.0 - 46.0 %   MCV 95.4 78.0 - 100.0 fL   MCH 32.4 26.0 - 34.0 pg   MCHC 34.0 30.0 - 36.0 g/dL   RDW 13.4 11.5 - 15.5 %   Platelets 251 150 - 400 K/uL    Comment: Performed at [facility], [place] [address], [place], [place] [address]  Urinalysis, Routine w reflex microscopic     Status: Abnormal   Collection Time: 03/16/17  1:25 PM  Result Value Ref Range   Color, Urine AMBER (A) YELLOW    Comment: BIOCHEMICALS MAY BE AFFECTED BY COLOR   APPearance CLOUDY (A) CLEAR   Specific Gravity, Urine 1.025 1.005 - 1.030   pH 5.0 5.0 - 8.0   Glucose, UA NEGATIVE NEGATIVE mg/dL   Hgb urine dipstick NEGATIVE NEGATIVE   Bilirubin Urine NEGATIVE NEGATIVE   Ketones, ur 5 (A) NEGATIVE mg/dL   Protein,  ur NEGATIVE NEGATIVE mg/dL   Nitrite NEGATIVE NEGATIVE   Leukocytes, UA TRACE (A) NEGATIVE   RBC / HPF 0-5 0 - 5 RBC/hpf   WBC, UA 6-30 0 - 5 WBC/hpf   Bacteria, UA RARE (A) NONE SEEN   Squamous Epithelial / LPF 6-30 (A) NONE SEEN   Mucus PRESENT     Comment: Performed at [facility], [place] [address] [place], [place] [address]  CBC     Status: Abnormal   Collection Time: 03/16/17  6:25 PM  Result Value Ref Range   WBC 11.5 (H) 4.0 - 10.5 K/uL   RBC 3.73 (L) 3.87 - 5.11 MIL/uL   Hemoglobin 12.3 12.0 - 15.0 g/dL   HCT 35.8 (L) 36.0 - 46.0 %   MCV 96.0 78.0 - 100.0 fL    MCH 33.0 26.0 - 34.0 pg   MCHC 34.4 30.0 - 36.0 g/dL   RDW 13.7 11.5 - 15.5 %   Platelets 232 150 - 400 K/uL    Comment: Performed at [facility], [place] [address] [place], [place] [address]  Creatinine, serum     Status: None   Collection Time: 03/16/17  6:25 PM  Result Value Ref Range   Creatinine, Ser 0.86 0.44 - 1.00 mg/dL   GFR calc non Af Amer >60 >60 mL/min   GFR calc Af Amer >60 >60 mL/min    Comment: (NOTE) The eGFR has been calculated using the CKD EPI equation. This calculation has not been validated in all clinical situations. eGFR's persistently <60 mL/min signify possible Chronic Kidney Disease. Performed at [facility], [place] [address] [place], [place] [address]   Glucose, capillary     Status: Abnormal   Collection Time: 03/16/17  9:49 PM  Result Value Ref Range   Glucose-Capillary 102 (H) 65 - 99 mg/dL   Comment 1 Notify RN    Comment 2 Document in Chart   Comprehensive metabolic panel     Status: Abnormal   Collection Time: 03/17/17  6:30 AM  Result Value Ref Range   Sodium 140 135 - 145 mmol/L   Potassium 3.7 3.5 - 5.1 mmol/L   Chloride 105 101 - 111 mmol/L   CO2 25 22 - 32 mmol/L   Glucose, Bld 98 65 - 99 mg/dL   BUN 14 6 - 20 mg/dL   Creatinine, Ser 0.96 0.44 - 1.00 mg/dL   Calcium 8.6 (L) 8.9 - 10.3 mg/dL   Total Protein 6.4 (L) 6.5 - 8.1 g/dL   Albumin 3.5 3.5 - 5.0 g/dL   AST 15 15 - 41 U/L   ALT 21 14 - 54 U/L   Alkaline Phosphatase 48 38 - 126 U/L   Total Bilirubin 2.1 (H) 0.3 - 1.2 mg/dL   GFR calc non Af Amer >60 >60 mL/min   GFR calc Af Amer >60 >60 mL/min    Comment: (NOTE) The eGFR has been calculated using the CKD EPI equation. This calculation has not been validated in all clinical situations. eGFR's persistently <60 mL/min signify possible Chronic Kidney Disease.    Anion gap 10 5 - 15    Comment: Performed at [facility], [place] [address] [place], [place] [address]  CBC      Status: Abnormal   Collection Time: 03/17/17  6:30 AM  Result Value Ref Range   WBC 7.4 4.0 - 10.5 K/uL   RBC 3.48 (L) 3.87 - 5.11 MIL/uL   Hemoglobin 11.4 (L) 12.0 -  15.0 g/dL   HCT 33.7 (L) 36.0 - 46.0 %   MCV 96.8 78.0 - 100.0 fL   MCH 32.8 26.0 - 34.0 pg   MCHC 33.8 30.0 - 36.0 g/dL   RDW 14.0 11.5 - 15.5 %   Platelets 219 150 - 400 K/uL    Comment: Performed at [facility], [place] [address], [place], [place] [address]  Protime-INR     Status: None   Collection Time: 03/17/17  6:30 AM  Result Value Ref Range   Prothrombin Time 14.3 11.4 - 15.2 seconds   INR 1.12     Comment: Performed at [facility], [place] [address] [place], [place] [address]  APTT     Status: Abnormal   Collection Time: 03/17/17  6:30 AM  Result Value Ref Range   aPTT 38 (H) 24 - 36 seconds    Comment:        IF BASELINE aPTT IS ELEVATED, SUGGEST PATIENT RISK ASSESSMENT BE USED TO DETERMINE APPROPRIATE ANTICOAGULANT THERAPY. Performed at [facility], [place] [address] [place] [address] [place], [place] [address]   Glucose, capillary     Status: None   Collection Time: 03/17/17  8:14 AM  Result Value Ref Range   Glucose-Capillary 89 65 - 99 mg/dL  Glucose, capillary     Status: None   Collection Time: 03/17/17 12:17 PM  Result Value Ref Range   Glucose-Capillary 76 65 - 99 mg/dL    Ct Abdomen Pelvis W Contrast  Result Date: 03/16/2017 CLINICAL DATA:  History of diverticulitis being treated with antibiotics. Recurrent fever. EXAM: CT ABDOMEN AND PELVIS WITH CONTRAST TECHNIQUE: Multidetector CT imaging of the abdomen and pelvis was performed using the standard protocol following bolus administration of intravenous contrast. CONTRAST:  145m ISOVUE-300 IOPAMIDOL (ISOVUE-300) INJECTION 61% COMPARISON:  01/29/2017.  05/10/2012. FINDINGS: Lower chest: Mild atelectasis/scarring at both lung bases. No pleural or pericardial fluid. Hepatobiliary: Fatty change of the liver.   No calcified gallstones. Pancreas: Normal Spleen: Normal Adrenals/Urinary Tract: Adrenal glands are normal. Kidneys are normal. Bladder is normal. Stomach/Bowel: No upper intestinal pathology. No abnormality of the colon until the descending colon where there is diverticulosis. There is acute diverticulitis in the sigmoid region with thickening and surrounding stranding. This has worsened compared to the study of January. I do not see a discrete abscess however. Vascular/Lymphatic: Aorta and IVC are normal. No retroperitoneal adenopathy. Reproductive: Previous hysterectomy.  No pelvic mass. Other: No free fluid or air. Musculoskeletal: Mild lumbar degenerative changes. IMPRESSION: Persistence and worsening of sigmoid diverticulitis with more regional fat stranding. No abscess, free fluid or free air is identified. Diffuse fatty change of the liver. Electronically Signed   By: MNelson ChimesM.D.   On: 03/16/2017 14:14    Review of Systems  Constitutional: Positive for fever and weight loss (lost 10 lbs with jan bout, but gained most of it back.).       She has gained about 50 lbs over the last couple years with knee issues making ambulation more difficult.  HENT: Negative.   Eyes: Negative.   Respiratory: Positive for cough (clear to dry cough), sputum production (clear) and shortness of breath (DOE - wt gain since knee surgeries). Negative for hemoptysis.   Cardiovascular: Positive for orthopnea, leg swelling and PND.       She is not sure if she has sleep apnea or not.  She snores a great deal, never tested for it.  Gastrointestinal: Positive for abdominal pain (worst  in LLQ), constipation (No BM for 2-3 days), heartburn (on PPI) and nausea. Negative for blood in stool, diarrhea, melena and vomiting.  Genitourinary: Negative.        Decreased volume  Musculoskeletal: Positive for joint pain.  Skin: Negative.   Neurological: Negative.   Endo/Heme/Allergies: Negative.   Psychiatric/Behavioral:  Negative.    Blood pressure (!) 131/59, pulse (!) 58, temperature 97.6 F (36.4 C), temperature source Oral, resp. rate 16, height 4' 11.5" (1.511 m), weight 76.2 kg (167 lb 15.9 oz), SpO2 96 %. Physical Exam  Constitutional: She is oriented to person, place, and time. She appears well-developed and well-nourished. No distress.  HENT:  Head: Normocephalic and atraumatic.  Mouth/Throat: Oropharynx is clear and moist. No oropharyngeal exudate.  Eyes: Right eye exhibits no discharge. Left eye exhibits no discharge. No scleral icterus.  Pupils are equal  Neck: Normal range of motion. Neck supple. No JVD present. No tracheal deviation present. No thyromegaly present.  Cardiovascular: Normal rate, regular rhythm, normal heart sounds and intact distal pulses.  No murmur heard. Respiratory: Effort normal and breath sounds normal. No respiratory distress. She has no wheezes. She has no rales. She exhibits no tenderness.  GI: Soft. Bowel sounds are normal. She exhibits no distension and no mass. There is tenderness (lower abdomen, more the LLQ than anyplace else). There is no rebound and no guarding.  Musculoskeletal: She exhibits edema (trace on right, ). She exhibits no tenderness.  Lymphadenopathy:    She has no cervical adenopathy.  Neurological: She is alert and oriented to person, place, and time. No cranial nerve deficit.  Skin: Skin is warm and dry. No rash noted. She is not diaphoretic. No erythema. No pallor.  Psychiatric: She has a normal mood and affect. Her behavior is normal. Thought content normal.    Assessment/Plan: Diverticulitis -  started 01/24/17, never completely resolved 10 day course of Cipro/Flagyl.  Sx reoccurred 2/15 and she was started on Augmentin.    Pre diabetes S/p right knee arthroscopy/left knee replacement Restless leg syndrome Hypothyroid - total thyroidectomy 2005 GERD/IBS Hypertension Dyslipidemia Body mass index is 33.3  Plan:  She needs to continue IV  antibiotics, and stay on clears till her symptoms improve. Hopefully she can recover from this, there is no abscess, or perforation.  After resolution of her diverticulitis she will need a colonoscopy which is currently scheduled for March already.    Kimo Bancroft 03/17/2017, 1:17 PM

## 2017-03-18 ENCOUNTER — Encounter: Payer: Self-pay | Admitting: Internal Medicine

## 2017-03-18 LAB — URINE CULTURE: CULTURE: NO GROWTH

## 2017-03-18 LAB — GLUCOSE, CAPILLARY
GLUCOSE-CAPILLARY: 123 mg/dL — AB (ref 65–99)
GLUCOSE-CAPILLARY: 71 mg/dL (ref 65–99)
Glucose-Capillary: 76 mg/dL (ref 65–99)
Glucose-Capillary: 74 mg/dL (ref 65–99)

## 2017-03-18 NOTE — Progress Notes (Signed)
Nutrition Brief Note  Patient identified on the Malnutrition Screening Tool (MST) Report  Weight is stable. Pt has been ordered [employer], pt is drinking.  Wt Readings from Last 15 Encounters:  03/18/17 166 lb 7.2 oz (75.5 kg)  03/09/17 168 lb (76.2 kg)  02/03/17 164 lb 9.6 oz (74.7 kg)  01/29/17 162 lb 6.4 oz (73.7 kg)  01/25/17 165 lb 6.4 oz (75 kg)  07/31/16 175 lb (79.4 kg)  04/13/16 170 lb (77.1 kg)  01/28/16 170 lb (77.1 kg)  12/13/15 169 lb 8 oz (76.9 kg)  03/08/15 174 lb 8 oz (79.2 kg)  01/25/15 172 lb (78 kg)  08/17/14 167 lb 8 oz (76 kg)  03/02/14 170 lb (77.1 kg)  01/30/14 165 lb (74.8 kg)  01/23/14 167 lb (75.8 kg)    Body mass index is 33.06 kg/m. Patient meets criteria for obesity based on current BMI.   Current diet order is clears. Labs and medications reviewed.   No nutrition interventions warranted at this time. If nutrition issues arise, please consult RD.   Clayton Bibles, MS, RD, [place] Dietitian Pager: [phone] After Hours Pager: [phone]

## 2017-03-18 NOTE — Progress Notes (Signed)
CC:  Abdominal pain   Subjective: Slightly better still having allot of pain LLQ.  8/10 earlier this AM.    Objective: Vital signs in last 24 hours: Temp:  [97.6 F (36.4 C)-98.6 F (37 C)] 98.2 F (36.8 C) (02/21 0526) Pulse Rate:  [50-58] 55 (02/21 0526) Resp:  [16-18] 17 (02/21 0526) BP: (131-167)/(59-77) 167/75 (02/21 0526) SpO2:  [96 %] 96 % (02/21 0526) Weight:  [75.5 kg (166 lb 7.2 oz)] 75.5 kg (166 lb 7.2 oz) (02/21 0500) Last BM Date: 03/18/17 280 PO 1950 IV 850 urine 1 stool this Am Afebrile, VSS No labs Intake/Output from previous day: 02/20 0701 - 02/21 0700 In: 2230 [P.O.:280; I.V.:1800; IV Piggyback:150] Out: 850 [Urine:850] Intake/Output this shift: Total I/O In: 240 [P.O.:240] Out: 200 [Urine:200]  General appearance: alert, cooperative and no distress Resp: clear to auscultation bilaterally GI: soft, ongoing pain and tenderness LLQ  Lab Results:  Recent Labs    03/16/17 1825 03/17/17 0630  WBC 11.5* 7.4  HGB 12.3 11.4*  HCT 35.8* 33.7*  PLT 232 219    BMET Recent Labs    03/16/17 1213 03/16/17 1825 03/17/17 0630  NA 137  --  140  K 3.7  --  3.7  CL 101  --  105  CO2 23  --  25  GLUCOSE 105*  --  98  BUN 12  --  14  CREATININE 0.95 0.86 0.96  CALCIUM 9.1  --  8.6*   PT/INR Recent Labs    03/17/17 0630  LABPROT 14.3  INR 1.12    Recent Labs  Lab 03/16/17 1213 03/17/17 0630  AST 19 15  ALT 25 21  ALKPHOS 62 48  BILITOT 2.1* 2.1*  PROT 7.4 6.4*  ALBUMIN 4.2 3.5     Lipase     Component Value Date/Time   LIPASE 24 03/16/2017 1213     Medications: . acetaminophen  1,000 mg Oral TID  . enoxaparin (LOVENOX) injection  40 mg Subcutaneous Q24H  . feeding supplement  1 Container Oral TID BM  . insulin aspart  0-5 Units Subcutaneous QHS  . insulin aspart  0-9 Units Subcutaneous TID WC  . irbesartan  150 mg Oral Daily  . levothyroxine  88 mcg Oral QAC breakfast  . pantoprazole  40 mg Oral Daily  . rOPINIRole   0.5 mg Oral TID  . rosuvastatin  10 mg Oral Daily   . sodium chloride 75 mL/hr at 03/16/17 2200  . piperacillin-tazobactam (ZOSYN)  IV 3.375 g (03/18/17 0810)   Anti-infectives (From admission, onward)   Start     Dose/Rate Route Frequency Ordered Stop   03/17/17 0000  piperacillin-tazobactam (ZOSYN) IVPB 3.375 g     3.375 g 12.5 mL/hr over 240 Minutes Intravenous Every 8 hours 03/16/17 1626     03/16/17 1630  piperacillin-tazobactam (ZOSYN) IVPB 3.375 g     3.375 g 100 mL/hr over 30 Minutes Intravenous  Once 03/16/17 1626 03/16/17 1737      Assessment/Plan Pre diabetes S/p right knee arthroscopy/left knee replacement Restless leg syndrome Hypothyroid - total thyroidectomy 2005 GERD/IBS Hypertension Dyslipidemia Body mass index is 33.3  Diverticulitis -  started 01/24/17, never completely resolved 10 day course of Cipro/Flagyl.  Sx reoccurred 2/15 and she was started on Augmentin.     FEN: IV fluids/clears ID:  Zosyn 2/19 =>> day 3 DVT: Lovenox Foley:  None Follow up:  TBD   Plan:  Continue clears for now, continue antibiotics.  CBGs  normal will defer to Medicine on this.  LOS: 2 days    Dain Laseter 03/18/2017 [phone]

## 2017-03-18 NOTE — Progress Notes (Signed)
[facility] PROGRESS NOTE  [name] KDT:[id number] DOB: March 12, 1950 DOA: 03/16/2017  PCP: Burnis Medin, MD  Brief History/Interval Summary: 67 year old Caucasian female with a past medical history of essential hypertension, hypothyroidism, restless leg syndrome presented with abdominal pain.  Colitis end of December.  She underwent a CT scan in early January which cannot rule out a soft tissue density in that area.  She was treated with a prolonged course of antibiotics with Cipro and Flagyl.  She did feel better however her symptoms recurred last week.  She was placed on Augmentin by her gastroenterologist, Dr. Earlean Shawl.  However subsequently developed a fever and so was asked to come into the hospital.  She was hospitalized and placed on Zosyn.  Reason for Visit: Acute recurrent diverticulitis  Consultants: Phone discussion with her gastroenterologist, Dr. Earlean Shawl.  General Surgery.  Procedures: None  Antibiotics: Zosyn  Subjective/Interval History: Patient states that pain may be slightly better.  However continues to have significant discomfort in the left lower part of her abdomen.  Some nausea but no vomiting.  Had a small bowel movement this morning.  ROS: No shortness of breath.  Objective:  Vital Signs  Vitals:   03/17/17 1643 03/17/17 2204 03/18/17 0500 03/18/17 0526  BP: (!) 145/71 133/77  (!) 167/75  Pulse: (!) 57 (!) 58  (!) 55  Resp: 18 17  17   Temp: 98.6 F (37 C) 98.4 F (36.9 C)  98.2 F (36.8 C)  TempSrc: Oral Oral  Oral  SpO2: 96% 96%  96%  Weight:   75.5 kg (166 lb 7.2 oz)   Height:        Intake/Output Summary (Last 24 hours) at 03/18/2017 0915 Last data filed at 03/18/2017 0746 Gross per 24 hour  Intake 2470 ml  Output 1050 ml  Net 1420 ml   Filed Weights   03/16/17 1031 03/17/17 0626 03/18/17 0500  Weight: 76.2 kg (168 lb) 76.2 kg (167 lb 15.9 oz) 75.5 kg (166 lb 7.2 oz)    General appearance: Awake alert.  In no  distress. Resp: Clear to auscultation bilaterally.  No wheezing rales or rhonchi Cardio: S1-S2 is normal regular.  No S3-S4.  No rubs murmurs or bruit GI: Abdomen remains tender to palpation in the left lower quadrant.  No rebound rigidity or guarding.  No masses organomegaly.  Bowel sounds are present.   Extremities: No edema Neurologic: No obvious focal neurological deficits.  Cranial nerves II to XII intact.  Lab Results:  Data Reviewed: I have personally reviewed following labs and imaging studies  CBC: Recent Labs  Lab 03/16/17 1213 03/16/17 1825 03/17/17 0630  WBC 11.2* 11.5* 7.4  HGB 13.4 12.3 11.4*  HCT 39.4 35.8* 33.7*  MCV 95.4 96.0 96.8  PLT 251 232 998    Basic Metabolic Panel: Recent Labs  Lab 03/16/17 1213 03/16/17 1825 03/17/17 0630  NA 137  --  140  K 3.7  --  3.7  CL 101  --  105  CO2 23  --  25  GLUCOSE 105*  --  98  BUN 12  --  14  CREATININE 0.95 0.86 0.96  CALCIUM 9.1  --  8.6*    GFR: Estimated Creatinine Clearance: 51.7 mL/min (by C-G formula based on SCr of 0.96 mg/dL).  Liver Function Tests: Recent Labs  Lab 03/16/17 1213 03/17/17 0630  AST 19 15  ALT 25 21  ALKPHOS 62 48  BILITOT 2.1* 2.1*  PROT 7.4 6.4*  ALBUMIN 4.2  3.5    Recent Labs  Lab 03/16/17 1213  LIPASE 24    Coagulation Profile: Recent Labs  Lab 03/17/17 0630  INR 1.12    CBG: Recent Labs  Lab 03/17/17 0814 03/17/17 1217 03/17/17 1655 03/17/17 2150 03/18/17 0741  GLUCAP 89 76 78 78 76     Radiology Studies: Ct Abdomen Pelvis W Contrast  Result Date: 03/16/2017 CLINICAL DATA:  History of diverticulitis being treated with antibiotics. Recurrent fever. EXAM: CT ABDOMEN AND PELVIS WITH CONTRAST TECHNIQUE: Multidetector CT imaging of the abdomen and pelvis was performed using the standard protocol following bolus administration of intravenous contrast. CONTRAST:  154mL ISOVUE-300 IOPAMIDOL (ISOVUE-300) INJECTION 61% COMPARISON:  01/29/2017.  05/10/2012.  FINDINGS: Lower chest: Mild atelectasis/scarring at both lung bases. No pleural or pericardial fluid. Hepatobiliary: Fatty change of the liver.  No calcified gallstones. Pancreas: Normal Spleen: Normal Adrenals/Urinary Tract: Adrenal glands are normal. Kidneys are normal. Bladder is normal. Stomach/Bowel: No upper intestinal pathology. No abnormality of the colon until the descending colon where there is diverticulosis. There is acute diverticulitis in the sigmoid region with thickening and surrounding stranding. This has worsened compared to the study of January. I do not see a discrete abscess however. Vascular/Lymphatic: Aorta and IVC are normal. No retroperitoneal adenopathy. Reproductive: Previous hysterectomy.  No pelvic mass. Other: No free fluid or air. Musculoskeletal: Mild lumbar degenerative changes. IMPRESSION: Persistence and worsening of sigmoid diverticulitis with more regional fat stranding. No abscess, free fluid or free air is identified. Diffuse fatty change of the liver. Electronically Signed   By: Nelson Chimes M.D.   On: 03/16/2017 14:14     Medications:  Scheduled: . acetaminophen  1,000 mg Oral TID  . enoxaparin (LOVENOX) injection  40 mg Subcutaneous Q24H  . feeding supplement  1 Container Oral TID BM  . insulin aspart  0-5 Units Subcutaneous QHS  . insulin aspart  0-9 Units Subcutaneous TID WC  . irbesartan  150 mg Oral Daily  . levothyroxine  88 mcg Oral QAC breakfast  . pantoprazole  40 mg Oral Daily  . rOPINIRole  0.5 mg Oral TID  . rosuvastatin  10 mg Oral Daily   Continuous: . sodium chloride 75 mL/hr at 03/16/17 2200  . piperacillin-tazobactam (ZOSYN)  IV 3.375 g (03/18/17 0810)   HAL:[id number] **OR** acetaminophen, bisacodyl, HYDROcodone-acetaminophen, ketorolac, ondansetron **OR** ondansetron (ZOFRAN) IV, senna-docusate, sodium phosphate  Assessment/Plan:  Active Problems:   Acute diverticulitis    Acute recurrent sigmoid diverticulitis This is  patient's second episode of same within the last month and a half.  Treated with prolonged course of ciprofloxacin and metronidazole in January for her first episode.  She failed outpatient treatment with Augmentin this month.  Currently on IV Zosyn.  She is usually followed by Dr. Earlean Shawl,  Gastroenterologist.  Discussed with him yesterday.  General surgery consulted based on his recommendation.  CT scan findings reviewed.  Continue clear liquid diet.  Appreciate general surgery input.    Mild urinary tract infection UA noted to be mildly abnormal.  Urine culture is negative.    History of hypothyroidism Continue Synthroid.  Essential hypertension Continue home medications.  Blood pressure is reasonably well controlled.  Restless leg syndrome Continue ropinirole  History of hyperlipidemia Continue home medications.  DVT Prophylaxis: Lovenox    Code Status: Full code Family Communication: Discussed with the patient Disposition Plan: Management as outlined above.  Check labs tomorrow.     LOS: 2 days   [facility] Pager  [phone] 03/18/2017, 9:15 AM  If 7PM-7AM, please contact night-coverage at [email or web address], password [facility]

## 2017-03-19 LAB — CBC
HEMATOCRIT: 33 % — AB (ref 36.0–46.0)
Hemoglobin: 11 g/dL — ABNORMAL LOW (ref 12.0–15.0)
MCH: 32.4 pg (ref 26.0–34.0)
MCHC: 33.3 g/dL (ref 30.0–36.0)
MCV: 97.3 fL (ref 78.0–100.0)
PLATELETS: 246 10*3/uL (ref 150–400)
RBC: 3.39 MIL/uL — ABNORMAL LOW (ref 3.87–5.11)
RDW: 13.5 % (ref 11.5–15.5)
WBC: 5.7 10*3/uL (ref 4.0–10.5)

## 2017-03-19 LAB — BASIC METABOLIC PANEL
ANION GAP: 12 (ref 5–15)
BUN: 9 mg/dL (ref 6–20)
CO2: 23 mmol/L (ref 22–32)
CREATININE: 0.88 mg/dL (ref 0.44–1.00)
Calcium: 8.8 mg/dL — ABNORMAL LOW (ref 8.9–10.3)
Chloride: 107 mmol/L (ref 101–111)
Glucose, Bld: 94 mg/dL (ref 65–99)
Potassium: 3.8 mmol/L (ref 3.5–5.1)
SODIUM: 142 mmol/L (ref 135–145)

## 2017-03-19 LAB — GLUCOSE, CAPILLARY
GLUCOSE-CAPILLARY: 91 mg/dL (ref 65–99)
Glucose-Capillary: 125 mg/dL — ABNORMAL HIGH (ref 65–99)

## 2017-03-19 NOTE — Progress Notes (Signed)
[facility] PROGRESS NOTE  [name] ELF:[id number] DOB: [birth year] DOA: 03/16/2017  PCP: Burnis Medin, MD  Brief History/Interval Summary: 67 year old Caucasian female with a past medical history of essential hypertension, hypothyroidism, restless leg syndrome presented with abdominal pain.  Colitis end of December.  She underwent a CT scan in early January which cannot rule out a soft tissue density in that area.  She was treated with a prolonged course of antibiotics with Cipro and Flagyl.  She did feel better however her symptoms recurred last week.  She was placed on Augmentin by her gastroenterologist, Dr. Earlean Shawl.  However subsequently developed a fever and so was asked to come into the hospital.  She was hospitalized and placed on Zosyn.  Reason for Visit: Acute recurrent diverticulitis  Consultants: Phone discussion with her gastroenterologist, Dr. Earlean Shawl.  General Surgery.  Procedures: None  Antibiotics: Zosyn  Subjective/Interval History: Patient states that she is feeling about the same.  No significant improvement in her abdominal pain.  Continues to have some nausea but no vomiting. Having bowel movements.    ROS: No shortness of breath.  Objective:  Vital Signs  Vitals:   03/18/17 1008 03/18/17 1745 03/18/17 2108 03/19/17 0548  BP: 134/65 (!) 149/73 139/66 (!) 165/92  Pulse: 64 (!) 58 61 (!) 50  Resp: 19 19 16 16   Temp: 98.6 F (37 C) 98.7 F (37.1 C) 97.9 F (36.6 C) 98.7 F (37.1 C)  TempSrc: Oral Oral Axillary Oral  SpO2: 97% 96% 98% 100%  Weight:      Height:        Intake/Output Summary (Last 24 hours) at 03/19/2017 0901 Last data filed at 03/19/2017 0600 Gross per 24 hour  Intake 2910 ml  Output 1226 ml  Net 1684 ml   Filed Weights   03/16/17 1031 03/17/17 0626 03/18/17 0500  Weight: 76.2 kg (168 lb) 76.2 kg (167 lb 15.9 oz) 75.5 kg (166 lb 7.2 oz)    General appearance: Awake alert.  In no distress. Resp: Remains clear to  auscultation bilaterally Cardio: S1-S2 normal regular.  No rubs murmurs or bruit GI: Abdomen remains tender to palpation in the left lower quadrant.  Slightly less so than yesterday.  No rebound rigidity or guarding.  No masses or organomegaly.  Bowel sounds are present.   Extremities: No edema Neurologic: No focal neurological deficits.  Lab Results:  Data Reviewed: I have personally reviewed following labs and imaging studies  CBC: Recent Labs  Lab 03/16/17 1213 03/16/17 1825 03/17/17 0630 03/19/17 0443  WBC 11.2* 11.5* 7.4 5.7  HGB 13.4 12.3 11.4* 11.0*  HCT 39.4 35.8* 33.7* 33.0*  MCV 95.4 96.0 96.8 97.3  PLT 251 232 219 585    Basic Metabolic Panel: Recent Labs  Lab 03/16/17 1213 03/16/17 1825 03/17/17 0630 03/19/17 0443  NA 137  --  140 142  K 3.7  --  3.7 3.8  CL 101  --  105 107  CO2 23  --  25 23  GLUCOSE 105*  --  98 94  BUN 12  --  14 9  CREATININE 0.95 0.86 0.96 0.88  CALCIUM 9.1  --  8.6* 8.8*    GFR: Estimated Creatinine Clearance: 56.4 mL/min (by C-G formula based on SCr of 0.88 mg/dL).  Liver Function Tests: Recent Labs  Lab 03/16/17 1213 03/17/17 0630  AST 19 15  ALT 25 21  ALKPHOS 62 48  BILITOT 2.1* 2.1*  PROT 7.4 6.4*  ALBUMIN 4.2 3.5  Recent Labs  Lab 03/16/17 1213  LIPASE 24    Coagulation Profile: Recent Labs  Lab 03/17/17 0630  INR 1.12    CBG: Recent Labs  Lab 03/18/17 0741 03/18/17 1150 03/18/17 1635 03/18/17 2106 03/19/17 0748  GLUCAP 76 74 123* 71 91     Radiology Studies: No results found.   Medications:  Scheduled: . acetaminophen  1,000 mg Oral TID  . enoxaparin (LOVENOX) injection  40 mg Subcutaneous Q24H  . feeding supplement  1 Container Oral TID BM  . insulin aspart  0-5 Units Subcutaneous QHS  . insulin aspart  0-9 Units Subcutaneous TID WC  . irbesartan  150 mg Oral Daily  . levothyroxine  88 mcg Oral QAC breakfast  . pantoprazole  40 mg Oral Daily  . rOPINIRole  0.5 mg Oral TID  .  rosuvastatin  10 mg Oral Daily   Continuous: . sodium chloride 75 mL/hr at 03/18/17 2119  . piperacillin-tazobactam (ZOSYN)  IV 3.375 g (03/19/17 0818)   WIO:[id number] **OR** acetaminophen, bisacodyl, HYDROcodone-acetaminophen, ketorolac, ondansetron **OR** ondansetron (ZOFRAN) IV, senna-docusate, sodium phosphate  Assessment/Plan:  Active Problems:   Acute diverticulitis    Acute recurrent sigmoid diverticulitis This is patient's second episode of same within the last month and a half.  Treated with prolonged course of ciprofloxacin and metronidazole in January for her first episode.  She failed outpatient treatment with Augmentin this month.  Underwent CT scan which showed persistent diverticulitis in the sigmoid colon area.  Currently on IV Zosyn.  She is usually followed by Dr. Earlean Shawl,  Gastroenterologist.  Patient's case was discussed with him on 2/20.  General surgery was consulted.  Appreciate their input.  Continue Zosyn for now.  Diet to be advanced to full liquids today.    Mild urinary tract infection UA noted to be mildly abnormal.  Urine culture is negative.    History of hypothyroidism Continue Synthroid.  Essential hypertension Continue home medications.  Blood pressure is reasonably well controlled.  Restless leg syndrome Continue ropinirole  History of hyperlipidemia Continue home medications.  DVT Prophylaxis: Lovenox    Code Status: Full code Family Communication: Discussed with the patient Disposition Plan: Management as outlined above.  Mobilize.     LOS: 3 days   [facility] Pager [phone] 03/19/2017, 9:01 AM  If 7PM-7AM, please contact night-coverage at [email or web address], password [facility]

## 2017-03-19 NOTE — Care Management Important Message (Signed)
Important Message  Patient Details  Name: Mayleen Borrero MRN: [id number] Date of Birth: 06-11-1950   Medicare Important Message Given:  Yes    Kerin Salen 03/19/2017, 1:40 Jay Message  Patient Details  Name: Mahealani Sulak MRN: [id number] Date of Birth: Mar 18, 1950   Medicare Important Message Given:  Yes    Kerin Salen 03/19/2017, 1:40 PM

## 2017-03-19 NOTE — Progress Notes (Signed)
    CC:  Abdominal pain   Subjective: Slightly better.  having less pain.  Having bowel function  Objective: Vital signs in last 24 hours: Temp:  [97.9 F (36.6 C)-98.7 F (37.1 C)] 98.7 F (37.1 C) (02/22 0548) Pulse Rate:  [50-64] 50 (02/22 0548) Resp:  [16-19] 16 (02/22 0548) BP: (134-165)/(65-92) 165/92 (02/22 0548) SpO2:  [96 %-100 %] 100 % (02/22 0548) Last BM Date: 03/18/17(Per pt report)  Intake/Output from previous day: 02/21 0701 - 02/22 0700 In: 4403 [P.O.:1320; I.V.:1800; IV Piggyback:150] Out: 1426 [Urine:1425; Stool:1] Intake/Output this shift: No intake/output data recorded.  General appearance: alert, cooperative and no distress Resp: clear to auscultation bilaterally GI: soft, ongoing pain and tenderness LLQ  Lab Results:  Recent Labs    03/17/17 0630 03/19/17 0443  WBC 7.4 5.7  HGB 11.4* 11.0*  HCT 33.7* 33.0*  PLT 219 246    BMET Recent Labs    03/17/17 0630 03/19/17 0443  NA 140 142  K 3.7 3.8  CL 105 107  CO2 25 23  GLUCOSE 98 94  BUN 14 9  CREATININE 0.96 0.88  CALCIUM 8.6* 8.8*   PT/INR Recent Labs    03/17/17 0630  LABPROT 14.3  INR 1.12    Recent Labs  Lab 03/16/17 1213 03/17/17 0630  AST 19 15  ALT 25 21  ALKPHOS 62 48  BILITOT 2.1* 2.1*  PROT 7.4 6.4*  ALBUMIN 4.2 3.5     Lipase     Component Value Date/Time   LIPASE 24 03/16/2017 1213     Medications: . acetaminophen  1,000 mg Oral TID  . enoxaparin (LOVENOX) injection  40 mg Subcutaneous Q24H  . feeding supplement  1 Container Oral TID BM  . insulin aspart  0-5 Units Subcutaneous QHS  . insulin aspart  0-9 Units Subcutaneous TID WC  . irbesartan  150 mg Oral Daily  . levothyroxine  88 mcg Oral QAC breakfast  . pantoprazole  40 mg Oral Daily  . rOPINIRole  0.5 mg Oral TID  . rosuvastatin  10 mg Oral Daily   . sodium chloride 75 mL/hr at 03/18/17 2119  . piperacillin-tazobactam (ZOSYN)  IV 3.375 g (03/19/17 0818)   Anti-infectives (From  admission, onward)   Start     Dose/Rate Route Frequency Ordered Stop   03/17/17 0000  piperacillin-tazobactam (ZOSYN) IVPB 3.375 g     3.375 g 12.5 mL/hr over 240 Minutes Intravenous Every 8 hours 03/16/17 1626     03/16/17 1630  piperacillin-tazobactam (ZOSYN) IVPB 3.375 g     3.375 g 100 mL/hr over 30 Minutes Intravenous  Once 03/16/17 1626 03/16/17 1737      Assessment/Plan Pre diabetes S/p right knee arthroscopy/left knee replacement Restless leg syndrome Hypothyroid - total thyroidectomy 2005 GERD/IBS Hypertension Dyslipidemia Body mass index is 33.3  Diverticulitis -  started 01/24/17, never completely resolved 10 day course of Cipro/Flagyl.  Sx reoccurred 2/15 and she was started on Augmentin.     FEN: IV fluids/clears ID:  Zosyn 2/19 =>> day 3 DVT: Lovenox Foley:  None Follow up:  TBD   Plan:  Will try full liquids today, continue antibiotics.    LOS: 3 days    Leighton Ruff C. [id number]/74/2595 [phone]

## 2017-03-20 DIAGNOSIS — R5383 Other fatigue: Secondary | ICD-10-CM

## 2017-03-20 LAB — BASIC METABOLIC PANEL
Anion gap: 14 (ref 5–15)
BUN: 6 mg/dL (ref 6–20)
CALCIUM: 8.8 mg/dL — AB (ref 8.9–10.3)
CHLORIDE: 102 mmol/L (ref 101–111)
CO2: 22 mmol/L (ref 22–32)
Creatinine, Ser: 0.78 mg/dL (ref 0.44–1.00)
GFR calc Af Amer: >60 mL/min (ref >60)
Glucose, Bld: 119 mg/dL — ABNORMAL HIGH (ref 65–99)
POTASSIUM: 3.5 mmol/L (ref 3.5–5.1)
SODIUM: 138 mmol/L (ref 135–145)

## 2017-03-20 LAB — MAGNESIUM: MAGNESIUM: 1.7 mg/dL (ref 1.7–2.4)

## 2017-03-20 MED ORDER — HYDRALAZINE HCL 20 MG/ML IJ SOLN
10.0000 mg | Freq: Four times a day (QID) | INTRAMUSCULAR | Status: DC | PRN
  Administered 2017-03-21: 10 mg via INTRAVENOUS
  Filled 2017-03-20: qty 1

## 2017-03-20 MED ORDER — POTASSIUM CHLORIDE CRYS ER 20 MEQ PO TBCR
40.0000 meq | EXTENDED_RELEASE_TABLET | Freq: Once | ORAL | Status: AC
  Administered 2017-03-20: 40 meq via ORAL
  Filled 2017-03-20: qty 2

## 2017-03-20 MED ORDER — ENSURE SURGERY PO LIQD
237.0000 mL | Freq: Two times a day (BID) | ORAL | Status: DC
  Administered 2017-03-21 – 2017-03-23 (×3): 237 mL via ORAL
  Filled 2017-03-20 (×11): qty 237

## 2017-03-20 MED ORDER — MAGNESIUM SULFATE IN D5W 1-5 GM/100ML-% IV SOLN
1.0000 g | Freq: Once | INTRAVENOUS | Status: AC
  Administered 2017-03-20: 1 g via INTRAVENOUS
  Filled 2017-03-20: qty 100

## 2017-03-20 NOTE — Progress Notes (Signed)
Patient ID: [name], female   DOB: [birth year], 67 y.o.   MRN: [id number]   Acute Care Surgery Service Progress Note:    Chief Complaint/Subjective: Having some abd pain this am. Feels a little bad. BP up this am Having loose BMs. Not eating much  Objective: Vital signs in last 24 hours: Temp:  [98 F (36.7 C)-99.1 F (37.3 C)] 99.1 F (37.3 C) (02/23 0758) Pulse Rate:  [48-57] 48 (02/23 0758) Resp:  [16-18] 16 (02/23 0600) BP: (138-176)/(71-81) 176/81 (02/23 0758) SpO2:  [96 %-100 %] 100 % (02/23 0600) Last BM Date: 03/20/17  Intake/Output from previous day: 02/22 0701 - 02/23 0700 In: 1620 [P.O.:720; I.V.:900] Out: 400 [Urine:400] Intake/Output this shift: Total I/O In: -  Out: 455 [Urine:455]  Lungs: cta, nonlabored  Cardiovascular: reg  Abd: obese, soft, some bruising, some TTP in LLQ/suprapubic, no guarding/rebound  Extremities: no edema, +SCDs  Neuro: alert, nonfocal  Lab Results: CBC  Recent Labs    03/19/17 0443  WBC 5.7  HGB 11.0*  HCT 33.0*  PLT 246   BMET Recent Labs    03/19/17 0443  NA 142  K 3.8  CL 107  CO2 23  GLUCOSE 94  BUN 9  CREATININE 0.88  CALCIUM 8.8*   LFT Hepatic Function Latest Ref Rng & Units 03/17/2017 03/16/2017 01/29/2017  Total Protein 6.5 - 8.1 g/dL 6.4(L) 7.4 6.7  Albumin 3.5 - 5.0 g/dL 3.5 4.2 4.2  AST 15 - 41 U/L _0 ALT 14 - 54 U/L _1 Alk Phosphatase 38 - 126 U/L 48 62 62  Total Bilirubin 0.3 - 1.2 mg/dL 2.1(H) 2.1(H) 1.0  Bilirubin, Direct 0.0 - 0.3 mg/dL - - -   PT/INR No results for input(s): LABPROT, INR in the last 72 hours. ABG No results for input(s): PHART, HCO3 in the last 72 hours.  Invalid input(s): PCO2, PO2  Studies/Results:  Anti-infectives: Anti-infectives (From admission, onward)   Start     Dose/Rate Route Frequency Ordered Stop   03/17/17 0000  piperacillin-tazobactam (ZOSYN) IVPB 3.375 g     3.375 g 12.5 mL/hr over 240 Minutes Intravenous Every 8 hours 03/16/17  1626     03/16/17 1630  piperacillin-tazobactam (ZOSYN) IVPB 3.375 g     3.375 g 100 mL/hr over 30 Minutes Intravenous  Once 03/16/17 1626 03/16/17 1737      Medications: Scheduled Meds: . acetaminophen  1,000 mg Oral TID  . enoxaparin (LOVENOX) injection  40 mg Subcutaneous Q24H  . feeding supplement  1 Container Oral TID BM  . irbesartan  150 mg Oral Daily  . levothyroxine  88 mcg Oral QAC breakfast  . pantoprazole  40 mg Oral Daily  . rOPINIRole  0.5 mg Oral TID  . rosuvastatin  10 mg Oral Daily   Continuous Infusions: . sodium chloride 75 mL/hr at 03/20/17 0547  . piperacillin-tazobactam (ZOSYN)  IV 3.375 g (03/20/17 0800)   PRN Meds:.acetaminophen **OR** acetaminophen, bisacodyl, HYDROcodone-acetaminophen, ketorolac, ondansetron **OR** ondansetron (ZOFRAN) IV, senna-docusate, sodium phosphate  Assessment/Plan: Patient Active Problem List   Diagnosis Date Noted  . Acute diverticulitis 03/16/2017  . Essential hypertension 08/17/2014  . Unspecified essential hypertension 08/01/2013  . Mild concussion 07/18/2013  . Head injury, unspecified 07/18/2013  . Back strain 07/18/2013  . Fall 07/18/2013  . Stress 06/26/2013  . Upper airway cough syndrome 06/15/2013  . Murmur 02/28/2013  . Chest discomfort 01/24/2013  . Dyspnea 01/24/2013  . Elevated blood pressure reading 01/24/2013  .  Stressful life event affecting family 01/24/2013  . Decreased exercise tolerance 01/24/2013  . Pre-diabetes 04/29/2012  . Abdominal swelling, left lower quadrant 04/29/2012  . Tingling in extremities 01/27/2012  . Cough, persistent 04/18/2011  . Sleep disturbance 11/23/2010  . Family history of premature coronary heart disease 11/23/2010  . Leg pain 11/17/2010  . Osteopenia 04/25/2010  . CATARACT, LEFT EYE 11/27/2009  . PURE HYPERCHOLESTEROLEMIA 01/29/2009  . OBESITY 01/12/2008  . DYSPNEA 01/12/2008  . ABDOMINAL ULTRASOUND, ABNORMAL 12/26/2007  . HYPERGLYCEMIA 12/14/2007  . LIVER  FUNCTION TESTS, ABNORMAL 12/14/2007  . NEPHROLITHIASIS, HX OF 11/06/2007  . VITAMIN D DEFICIENCY 11/25/2006  . HYPOTHYROIDISM 10/04/2006  . HYPERLIPIDEMIA NEC/NOS 10/04/2006  . SYNDROME, RESTLESS LEGS 10/04/2006  . DISORDER, BONE/CARTILAGE NOS 10/04/2006  . WEIGHT GAIN 10/04/2006  . ELEVATION, TRANSAMINASE/LDH LEVELS 10/04/2006  . ASTHMA 09/30/2006  . GERD 09/30/2006   Pre diabetes S/p right knee arthroscopy/left knee replacement Restless leg syndrome Hypothyroid - total thyroidectomy 2005 GERD/IBS Hypertension Dyslipidemia Body mass index is 33.3  Diverticulitis - started 01/24/17, never completely resolved 10 day course of Cipro/Flagyl. Sx reoccurred 2/15 and she was started on Augmentin.    FEN: IV fluids/fulls ID:  Zosyn 2/19 =>> day 4 DVT: Lovenox Foley:  None Follow up:  TBD  Has smoldering diverticulitis  Cont fulls will add protein shakes Not sure she will get thru this hospitalization without colectomy   Disposition:  LOS: 4 days    Leighton Ruff. Redmond Pulling, MD, FACS General, Bariatric, & Minimally Invasive Surgery [phone] [facility]

## 2017-03-20 NOTE — Progress Notes (Signed)
[facility] PROGRESS NOTE  [name] DQQ:[id number] DOB: Sep 28, 1950 DOA: 03/16/2017  PCP: Burnis Medin, MD  Brief History/Interval Summary: 67 year old Caucasian female with a past medical history of essential hypertension, hypothyroidism, restless leg syndrome presented with abdominal pain.  Colitis end of December.  She underwent a CT scan in early January which cannot rule out a soft tissue density in that area.  She was treated with a prolonged course of antibiotics with Cipro and Flagyl.  She did feel better however her symptoms recurred last week.  She was placed on Augmentin by her gastroenterologist, Dr. Earlean Shawl.  However subsequently developed a fever and so was asked to come into the hospital.  She was hospitalized and placed on Zosyn.  Reason for Visit: Acute recurrent diverticulitis  Consultants: Phone discussion with her gastroenterologist, Dr. Earlean Shawl.  General Surgery.  Procedures: None  Antibiotics: Zosyn  Subjective/Interval History: Patient states that she felt flushed earlier today.  Unable to describe her symptoms.  However she denies any chest pain.  Did have some shortness of breath earlier but currently none.  Abdominal pain is about the same.  Continues to tolerate her diet but reports poor appetite.  Continues to have bowel movement.     ROS: No shortness of breath.  Objective:  Vital Signs  Vitals:   03/19/17 2200 03/20/17 0540 03/20/17 0600 03/20/17 0758  BP: (!) 152/73 (!) 169/79 (!) 169/79 (!) 176/81  Pulse: (!) 48 (!) 48 (!) 48 (!) 48  Resp: 18 16 16    Temp: 98 F (36.7 C) 98.3 F (36.8 C) 98.3 F (36.8 C) 99.1 F (37.3 C)  TempSrc: Oral Oral Oral Oral  SpO2: 98% 100% 100%   Weight:      Height:        Intake/Output Summary (Last 24 hours) at 03/20/2017 0917 Last data filed at 03/20/2017 0805 Gross per 24 hour  Intake 1620 ml  Output 605 ml  Net 1015 ml   Filed Weights   03/16/17 1031 03/17/17 0626 03/18/17 0500  Weight:  76.2 kg (168 lb) 76.2 kg (167 lb 15.9 oz) 75.5 kg (166 lb 7.2 oz)    General appearance: Awake alert.  In no distress Resp: Clear to auscultation bilaterally. Cardio: S1-S2 is noted to be bradycardic regular.  No S3-S4.  No rubs murmurs or bruit. GI: Abdomen remains tender to palpation in the left lower quadrant.  About the same as yesterday.  No rebound rigidity or guarding.  Bowel sounds are present.   Extremities: no Edema Neurologic: No focal neurological deficits.  Lab Results:  Data Reviewed: I have personally reviewed following labs and imaging studies  CBC: Recent Labs  Lab 03/16/17 1213 03/16/17 1825 03/17/17 0630 03/19/17 0443  WBC 11.2* 11.5* 7.4 5.7  HGB 13.4 12.3 11.4* 11.0*  HCT 39.4 35.8* 33.7* 33.0*  MCV 95.4 96.0 96.8 97.3  PLT 251 232 219 194    Basic Metabolic Panel: Recent Labs  Lab 03/16/17 1213 03/16/17 1825 03/17/17 0630 03/19/17 0443  NA 137  --  140 142  K 3.7  --  3.7 3.8  CL 101  --  105 107  CO2 23  --  25 23  GLUCOSE 105*  --  98 94  BUN 12  --  14 9  CREATININE 0.95 0.86 0.96 0.88  CALCIUM 9.1  --  8.6* 8.8*    GFR: Estimated Creatinine Clearance: 56.4 mL/min (by C-G formula based on SCr of 0.88 mg/dL).  Liver Function Tests: Recent  Labs  Lab 03/16/17 1213 03/17/17 0630  AST 19 15  ALT 25 21  ALKPHOS 62 48  BILITOT 2.1* 2.1*  PROT 7.4 6.4*  ALBUMIN 4.2 3.5    Recent Labs  Lab 03/16/17 1213  LIPASE 24    Coagulation Profile: Recent Labs  Lab 03/17/17 0630  INR 1.12    CBG: Recent Labs  Lab 03/18/17 1150 03/18/17 1635 03/18/17 2106 03/19/17 0748 03/19/17 1201  GLUCAP 74 123* 71 91 125*     Radiology Studies: No results found.   Medications:  Scheduled: . acetaminophen  1,000 mg Oral TID  . enoxaparin (LOVENOX) injection  40 mg Subcutaneous Q24H  . feeding supplement  1 Container Oral TID BM  . irbesartan  150 mg Oral Daily  . levothyroxine  88 mcg Oral QAC breakfast  . pantoprazole  40 mg Oral  Daily  . rOPINIRole  0.5 mg Oral TID  . rosuvastatin  10 mg Oral Daily   Continuous: . sodium chloride 75 mL/hr at 03/20/17 0547  . piperacillin-tazobactam (ZOSYN)  IV 3.375 g (03/20/17 0800)   ALP:[id number] **OR** acetaminophen, bisacodyl, HYDROcodone-acetaminophen, ketorolac, ondansetron **OR** ondansetron (ZOFRAN) IV, senna-docusate, sodium phosphate  Assessment/Plan:  Active Problems:   Acute diverticulitis    Acute recurrent sigmoid diverticulitis This is patient's second episode of same within the last month and a half.  Treated with prolonged course of ciprofloxacin and metronidazole in January for her first episode.  She failed outpatient treatment with Augmentin this month.  Underwent CT scan which showed persistent diverticulitis in the sigmoid colon area. She is usually followed by Dr. Earlean Shawl,  Gastroenterologist.  Patient's case was discussed with him on 2/20.  Currently on IV Zosyn.  General surgery is following.  Continues to remain symptomatic.    Fatigue Patient unable to describe her symptoms today.  She had a flushing sensation earlier today.  She remains stable from a blood pressure standpoint.  Heart rate noted to be low but does not appear to be symptomatic from that standpoint.  She is not on any heart rate lowering agents.  We will get an EKG.  Check TSH and free T4 in the morning.  Check electrolytes.  Mild urinary tract infection UA noted to be mildly abnormal.  Urine culture is negative.    History of hypothyroidism Continue Synthroid.  Essential hypertension Blood pressure noted to be in the hypertensive range this morning.  Continue to monitor for now.  Restless leg syndrome Continue ropinirole  History of hyperlipidemia Continue home medications.  DVT Prophylaxis: Lovenox    Code Status: Full code Family Communication: Discussed with the patient Disposition Plan: Management as outlined above.  Mobilize.    LOS: 4 days   [facility] Pager [phone] 03/20/2017, 9:17 AM  If 7PM-7AM, please contact night-coverage at [email or web address], password [facility]

## 2017-03-21 DIAGNOSIS — E890 Postprocedural hypothyroidism: Secondary | ICD-10-CM

## 2017-03-21 DIAGNOSIS — R001 Bradycardia, unspecified: Secondary | ICD-10-CM

## 2017-03-21 LAB — CBC
HCT: 33.8 % — ABNORMAL LOW (ref 36.0–46.0)
Hemoglobin: 11.4 g/dL — ABNORMAL LOW (ref 12.0–15.0)
MCH: 32.8 pg (ref 26.0–34.0)
MCHC: 33.7 g/dL (ref 30.0–36.0)
MCV: 97.1 fL (ref 78.0–100.0)
Platelets: 271 10*3/uL (ref 150–400)
RBC: 3.48 MIL/uL — ABNORMAL LOW (ref 3.87–5.11)
RDW: 13.7 % (ref 11.5–15.5)
WBC: 4.8 10*3/uL (ref 4.0–10.5)

## 2017-03-21 LAB — BASIC METABOLIC PANEL
Anion gap: 11 (ref 5–15)
BUN: 5 mg/dL — AB (ref 6–20)
CALCIUM: 8.9 mg/dL (ref 8.9–10.3)
CO2: 25 mmol/L (ref 22–32)
Chloride: 105 mmol/L (ref 101–111)
Creatinine, Ser: 0.88 mg/dL (ref 0.44–1.00)
GFR calc Af Amer: >60 mL/min (ref >60)
GLUCOSE: 113 mg/dL — AB (ref 65–99)
Potassium: 3.9 mmol/L (ref 3.5–5.1)
Sodium: 141 mmol/L (ref 135–145)

## 2017-03-21 LAB — CULTURE, BLOOD (ROUTINE X 2)
CULTURE: NO GROWTH
Culture: NO GROWTH
SPECIAL REQUESTS: ADEQUATE
SPECIAL REQUESTS: ADEQUATE

## 2017-03-21 LAB — T4, FREE: Free T4: 1.17 ng/dL — ABNORMAL HIGH (ref 0.61–1.12)

## 2017-03-21 LAB — TSH: TSH: 16.011 u[IU]/mL — ABNORMAL HIGH (ref 0.350–4.500)

## 2017-03-21 MED ORDER — DIPHENHYDRAMINE HCL 50 MG/ML IJ SOLN
25.0000 mg | Freq: Once | INTRAMUSCULAR | Status: AC
  Administered 2017-03-21: 25 mg via INTRAVENOUS
  Filled 2017-03-21: qty 1

## 2017-03-21 MED ORDER — METHYLPREDNISOLONE SODIUM SUCC 125 MG IJ SOLR
125.0000 mg | Freq: Once | INTRAMUSCULAR | Status: AC
  Administered 2017-03-21: 125 mg via INTRAVENOUS
  Filled 2017-03-21: qty 2

## 2017-03-21 MED ORDER — LEVOTHYROXINE SODIUM 112 MCG PO TABS
112.0000 ug | ORAL_TABLET | Freq: Every day | ORAL | Status: DC
  Administered 2017-03-22 – 2017-03-24 (×3): 112 ug via ORAL
  Filled 2017-03-21 (×3): qty 1

## 2017-03-21 NOTE — Progress Notes (Signed)
[facility] PROGRESS NOTE  [name] NLZ:[id number] DOB: 02/12/1950 DOA: 03/16/2017  PCP: Burnis Medin, MD  Brief History/Interval Summary: 67 year old Caucasian female with a past medical history of essential hypertension, hypothyroidism, restless leg syndrome presented with abdominal pain.  Colitis end of December.  She underwent a CT scan in early January which cannot rule out a soft tissue density in that area.  She was treated with a prolonged course of antibiotics with Cipro and Flagyl.  She did feel better however her symptoms recurred last week.  She was placed on Augmentin by her gastroenterologist, Dr. Earlean Shawl.  However subsequently developed a fever and so was asked to come into the hospital.  She was hospitalized and placed on Zosyn.  Reason for Visit: Acute recurrent diverticulitis  Consultants:  Phone discussion with her gastroenterologist, Dr. Earlean Shawl.   General Surgery.  Procedures: None  Antibiotics: Zosyn  Subjective/Interval History: Patient continues to feel tired however reports that she may be slightly better today compared to yesterday.  Less abdominal discomfort.  Does admit to feeling cold all the time.  No shortness of breath.  No chest pain.  No nausea vomiting.  Having loose stools.  ROS: No headaches.  No dizziness or lightheadedness.  She did ambulate.  Objective:  Vital Signs  Vitals:   03/20/17 1430 03/20/17 2159 03/21/17 0428 03/21/17 0500  BP: (!) 163/75 (!) 152/83 133/74   Pulse: (!) 43 (!) 54 (!) 48   Resp: 16 18 16    Temp: 98.4 F (36.9 C) 98.3 F (36.8 C) 98.2 F (36.8 C)   TempSrc: Oral Oral Oral   SpO2: 96% 96% 95%   Weight:    74.6 kg (164 lb 8 oz)  Height:        Intake/Output Summary (Last 24 hours) at 03/21/2017 1014 Last data filed at 03/21/2017 0900 Gross per 24 hour  Intake 2668.75 ml  Output 500 ml  Net 2168.75 ml   Filed Weights   03/17/17 0626 03/18/17 0500 03/21/17 0500  Weight: 76.2 kg (167 lb 15.9  oz) 75.5 kg (166 lb 7.2 oz) 74.6 kg (164 lb 8 oz)    General appearance: Awake alert.  In no distress. Resp: Clear to auscultation bilaterally.  No wheezing rales or rhonchi Cardio: S1-S2 is bradycardic regular.  No S3-S4.  No rubs murmurs or bruit.   GI: Abdomen remains tender in the left lower quadrant but appears to be better compared to yesterday.  No rebound rigidity or guarding.  No masses organomegaly.  Bowel sounds are present.   Extremities: No edema Neurologic: No focal neurological deficits.  Lab Results:  Data Reviewed: I have personally reviewed following labs and imaging studies  CBC: Recent Labs  Lab 03/16/17 1213 03/16/17 1825 03/17/17 0630 03/19/17 0443 03/21/17 0522  WBC 11.2* 11.5* 7.4 5.7 4.8  HGB 13.4 12.3 11.4* 11.0* 11.4*  HCT 39.4 35.8* 33.7* 33.0* 33.8*  MCV 95.4 96.0 96.8 97.3 97.1  PLT 251 232 219 246 902    Basic Metabolic Panel: Recent Labs  Lab 03/16/17 1213 03/16/17 1825 03/17/17 0630 03/19/17 0443 03/20/17 0953 03/21/17 0522  NA 137  --  140 142 138 141  K 3.7  --  3.7 3.8 3.5 3.9  CL 101  --  105 107 102 105  CO2 23  --  25 23 22 25   GLUCOSE 105*  --  98 94 119* 113*  BUN 12  --  14 9 6  5*  CREATININE 0.95 0.86 0.96 0.88  0.78 0.88  CALCIUM 9.1  --  8.6* 8.8* 8.8* 8.9  MG  --   --   --   --  1.7  --     GFR: Estimated Creatinine Clearance: 56.1 mL/min (by C-G formula based on SCr of 0.88 mg/dL).  Liver Function Tests: Recent Labs  Lab 03/16/17 1213 03/17/17 0630  AST 19 15  ALT 25 21  ALKPHOS 62 48  BILITOT 2.1* 2.1*  PROT 7.4 6.4*  ALBUMIN 4.2 3.5    Recent Labs  Lab 03/16/17 1213  LIPASE 24    Coagulation Profile: Recent Labs  Lab 03/17/17 0630  INR 1.12    CBG: Recent Labs  Lab 03/18/17 1150 03/18/17 1635 03/18/17 2106 03/19/17 0748 03/19/17 1201  GLUCAP 74 123* 71 91 125*     Radiology Studies: No results found.   Medications:  Scheduled: . acetaminophen  1,000 mg Oral TID  .  enoxaparin (LOVENOX) injection  40 mg Subcutaneous Q24H  . feeding supplement  237 mL Oral BID BM  . irbesartan  150 mg Oral Daily  . [START ON 03/22/2017] levothyroxine  112 mcg Oral QAC breakfast  . pantoprazole  40 mg Oral Daily  . rOPINIRole  0.5 mg Oral TID  . rosuvastatin  10 mg Oral Daily   Continuous: . sodium chloride 75 mL/hr at 03/20/17 2136  . piperacillin-tazobactam (ZOSYN)  IV 3.375 g (03/21/17 0919)   QQP:[id number] **OR** acetaminophen, bisacodyl, hydrALAZINE, HYDROcodone-acetaminophen, ketorolac, ondansetron **OR** ondansetron (ZOFRAN) IV, senna-docusate, sodium phosphate  Assessment/Plan:  Active Problems:   Acute diverticulitis    Acute recurrent sigmoid diverticulitis This is patient's second episode of same within the last month and a half.  Treated with prolonged course of ciprofloxacin and metronidazole in January for her first episode.  She failed outpatient treatment with Augmentin this month.  Underwent CT scan which showed persistent diverticulitis in the sigmoid colon area. She is usually followed by Dr. Earlean Shawl,  Gastroenterologist.  Patient's case was discussed with him on 2/20.  Patient was started on Zosyn.  General surgery was consulted.  Patient has been very slow to improve.  Fatigue/Bradycardia Not as fatigued as yesterday.  Etiology unclear.  She is noted to have sinus bradycardia but blood pressures have been stable.  EKG with no significant changes compared to previous except for Sinus bradycardia.  TSH noted to be significantly elevated.  Free T4 also slightly on the higher side.  See below.  Will order echocardiogram.  History of hypothyroidism/thyroidectomy TSH noted to be 16 with free T4 1.17.  Patient exhibiting some cold intolerance.  Bradycardia could be due to thyroid deficiency.  We will check free T3 level.  Her TSH was normal about a year ago.  No recent changes made to her thyroid medications per patient.  Since patient does have  symptoms we will increase the dose of her Synthroid   Mild urinary tract infection UA noted to be mildly abnormal.  Urine culture is negative.    Essential hypertension Blood pressure remains stable.  Continue to monitor.  Restless leg syndrome Continue ropinirole  History of hyperlipidemia Continue home medications.  DVT Prophylaxis: Lovenox    Code Status: Full code Family Communication: Discussed with the patient Disposition Plan: Management as outlined above.  Mobilize.    LOS: 5 days   [facility] Pager [phone] 03/21/2017, 10:14 AM  If 7PM-7AM, please contact night-coverage at [email or web address], password [facility]

## 2017-03-21 NOTE — Progress Notes (Signed)
PRN Hydralazine administered at 1757 for BP 172/76.  1900; facial flushing noted; BUE tremors,patient reports feeling hot and "scared." Patient denies difficulty breathing. MD paged. 1918:  BP: 157/71 HR: 93; 1922 administered Benadryl and Solu-medrol IV per MD orders.  Currently, symptoms improving slowly, patient resting in bed; No signs of respiratory distress. Family at bedside. Patient states, "I'm alright." Allergy list updated.  On-coming RN made aware; .  Night RN to continue to monitor for changes.

## 2017-03-21 NOTE — Progress Notes (Addendum)
Patient ID: [name], female   DOB: 02/12/1950, 67 y.o.   MRN: [id number]   Acute Care Surgery Service Progress Note:    Chief Complaint/Subjective: Still with LLQ pain. Some loose stools  No vomiting but some occasional nausea Had some soup x 2, chocolate milkshake, soda yesterday Pain was 7 when admitted but now about a 5 but will increase to 7 at times  Objective: Vital signs in last 24 hours: Temp:  [98.2 F (36.8 C)-98.7 F (37.1 C)] 98.2 F (36.8 C) (02/24 0428) Pulse Rate:  [43-54] 48 (02/24 0428) Resp:  [16-18] 16 (02/24 0428) BP: (133-172)/(74-83) 133/74 (02/24 0428) SpO2:  [95 %-97 %] 95 % (02/24 0428) Weight:  [74.6 kg (164 lb 8 oz)] 74.6 kg (164 lb 8 oz) (02/24 0500) Last BM Date: 03/20/17  Intake/Output from previous day: 02/23 0701 - 02/24 0700 In: 2428.8 [P.O.:480; I.V.:1748.8; IV Piggyback:200] Out: 705 [Urine:705] Intake/Output this shift: Total I/O In: 240 [P.O.:240] Out: -   Lungs: cta, nonlabored  Cardiovascular: reg  Abd: obese, soft, mild TTP  Extremities: no edema, +SCDs  Neuro: alert, nonfocal  Lab Results: CBC  Recent Labs    03/19/17 0443 03/21/17 0522  WBC 5.7 4.8  HGB 11.0* 11.4*  HCT 33.0* 33.8*  PLT 246 271   BMET Recent Labs    03/20/17 0953 03/21/17 0522  NA 138 141  K 3.5 3.9  CL 102 105  CO2 22 25  GLUCOSE 119* 113*  BUN 6 5*  CREATININE 0.78 0.88  CALCIUM 8.8* 8.9   LFT Hepatic Function Latest Ref Rng & Units 03/17/2017 03/16/2017 01/29/2017  Total Protein 6.5 - 8.1 g/dL 6.4(L) 7.4 6.7  Albumin 3.5 - 5.0 g/dL 3.5 4.2 4.2  AST 15 - 41 U/L 15 19 21  ALT 14 - 54 U/L 21 25 27  Alk Phosphatase 38 - 126 U/L 48 62 62  Total Bilirubin 0.3 - 1.2 mg/dL 2.1(H) 2.1(H) 1.0  Bilirubin, Direct 0.0 - 0.3 mg/dL - - -   PT/INR No results for input(s): LABPROT, INR in the last 72 hours. ABG No results for input(s): PHART, HCO3 in the last 72 hours.  Invalid input(s): PCO2,  PO2  Studies/Results:  Anti-infectives: Anti-infectives (From admission, onward)   Start     Dose/Rate Route Frequency Ordered Stop   03/17/17 0000  piperacillin-tazobactam (ZOSYN) IVPB 3.375 g     3.375 g 12.5 mL/hr over 240 Minutes Intravenous Every 8 hours 03/16/17 1626     03/16/17 1630  piperacillin-tazobactam (ZOSYN) IVPB 3.375 g     3.375 g 100 mL/hr over 30 Minutes Intravenous  Once 03/16/17 1626 03/16/17 1737      Medications: Scheduled Meds: . acetaminophen  1,000 mg Oral TID  . enoxaparin (LOVENOX) injection  40 mg Subcutaneous Q24H  . feeding supplement  237 mL Oral BID BM  . irbesartan  150 mg Oral Daily  . [START ON 03/22/2017] levothyroxine  112 mcg Oral QAC breakfast  . pantoprazole  40 mg Oral Daily  . rOPINIRole  0.5 mg Oral TID  . rosuvastatin  10 mg Oral Daily   Continuous Infusions: . sodium chloride 75 mL/hr at 03/20/17 2136  . piperacillin-tazobactam (ZOSYN)  IV 3.375 g (03/21/17 0919)   PRN Meds:.acetaminophen **OR** acetaminophen, bisacodyl, hydrALAZINE, HYDROcodone-acetaminophen, ketorolac, ondansetron **OR** ondansetron (ZOFRAN) IV, senna-docusate, sodium phosphate  Assessment/Plan: Patient Active Problem List   Diagnosis Date Noted  . Acute diverticulitis 03/16/2017  . Essential hypertension 08/17/2014  . Unspecified essential hypertension 08/01/2013  .   Mild concussion 07/18/2013  . Head injury, unspecified 07/18/2013  . Back strain 07/18/2013  . Fall 07/18/2013  . Stress 06/26/2013  . Upper airway cough syndrome 06/15/2013  . Murmur 02/28/2013  . Chest discomfort 01/24/2013  . Dyspnea 01/24/2013  . Elevated blood pressure reading 01/24/2013  . Stressful life event affecting family 01/24/2013  . Decreased exercise tolerance 01/24/2013  . Pre-diabetes 04/29/2012  . Abdominal swelling, left lower quadrant 04/29/2012  . Tingling in extremities 01/27/2012  . Cough, persistent 04/18/2011  . Sleep disturbance 11/23/2010  . Family history of  premature coronary heart disease 11/23/2010  . Leg pain 11/17/2010  . Osteopenia 04/25/2010  . CATARACT, LEFT EYE 11/27/2009  . PURE HYPERCHOLESTEROLEMIA 01/29/2009  . OBESITY 01/12/2008  . DYSPNEA 01/12/2008  . ABDOMINAL ULTRASOUND, ABNORMAL 12/26/2007  . HYPERGLYCEMIA 12/14/2007  . LIVER FUNCTION TESTS, ABNORMAL 12/14/2007  . NEPHROLITHIASIS, HX OF 11/06/2007  . VITAMIN D DEFICIENCY 11/25/2006  . HYPOTHYROIDISM 10/04/2006  . HYPERLIPIDEMIA NEC/NOS 10/04/2006  . SYNDROME, RESTLESS LEGS 10/04/2006  . DISORDER, BONE/CARTILAGE NOS 10/04/2006  . WEIGHT GAIN 10/04/2006  . ELEVATION, TRANSAMINASE/LDH LEVELS 10/04/2006  . ASTHMA 09/30/2006  . GERD 09/30/2006    Pre diabetes S/p right knee arthroscopy/left knee replacement Restless leg syndrome Hypothyroid - total thyroidectomy 2005 GERD/IBS Hypertension Dyslipidemia Body mass index is 33.3  Diverticulitis - started 01/24/17, never completely resolved 10 day course of Cipro/Flagyl. Sx reoccurred 2/15 and she was started on Augmentin.    FEN: IV fluids/fulls ID: Zosyn 2/19 =>>day 5 DVT: Lovenox Foley: None Follow up: TBD  Has smoldering diverticulitis Not sure if will tolerate solids so will keep on fulls; moreover wouldn't need a bowel prep Nurse to mix chocolate ensure with vanilla ensure surgery shake (pt doesn't like vanilla) I dont think she is going to get over this episode without surgery  Check nutrition labs in am (albumin, prealbumin) Dr Lucia Gaskins to review records and finalize plans  - he is coming on service Monday  Leighton Ruff. Redmond Pulling, MD, FACS General, Bariatric, & Minimally Invasive Surgery [facility], [place]   Disposition:  LOS: 5 days    Leighton Ruff. Redmond Pulling, MD, FACS General, Bariatric, & Minimally Invasive Surgery [phone] [facility]

## 2017-03-22 ENCOUNTER — Inpatient Hospital Stay (HOSPITAL_COMMUNITY): Payer: Medicare Other

## 2017-03-22 DIAGNOSIS — I361 Nonrheumatic tricuspid (valve) insufficiency: Secondary | ICD-10-CM

## 2017-03-22 LAB — PREALBUMIN: PREALBUMIN: 24.8 mg/dL (ref 18–38)

## 2017-03-22 LAB — ECHOCARDIOGRAM COMPLETE
Ao-asc: 29 cm
CHL CUP DOP CALC LVOT VTI: 29.4 cm
CHL CUP MV DEC (S): 225
CHL CUP STROKE VOLUME: 40 mL
E decel time: 225 msec
E/e' ratio: 13.78
FS: 32 % (ref 28–44)
HEIGHTINCHES: 59.5 in
IVS/LV PW RATIO, ED: 1.05
LA diam end sys: 29 mm
LA diam index: 1.77 cm/m2
LA vol A4C: 28.4 ml
LA vol index: 20.4 mL/m2
LA vol: 33.5 mL
LASIZE: 29 mm
LDCA: 2.27 cm2
LV E/e'average: 13.78
LV TDI E'LATERAL: 7.62
LV dias vol index: 39 mL/m2
LV sys vol index: 14 mL/m2
LV sys vol: 24 mL (ref 14–42)
LVDIAVOL: 64 mL (ref 46–106)
LVEEMED: 13.78
LVELAT: 7.62 cm/s
LVOT peak grad rest: 8 mmHg
LVOT peak vel: 145 cm/s
LVOTD: 17 mm
LVOTSV: 67 mL
Lateral S' vel: 11.6 cm/s
MV pk E vel: 105 m/s
MVPG: 4 mmHg
MVPKAVEL: 113 m/s
PW: 11 mm — AB (ref 0.6–1.1)
Reg peak vel: 250 cm/s
Simpson's disk: 63
TAPSE: 19.9 mm
TDI e' medial: 5.55
TR max vel: 250 cm/s
Weight: 2409.19 oz

## 2017-03-22 LAB — COMPREHENSIVE METABOLIC PANEL
ALBUMIN: 3.9 g/dL (ref 3.5–5.0)
ALK PHOS: 65 U/L (ref 38–126)
ALT: 33 U/L (ref 14–54)
AST: 24 U/L (ref 15–41)
Anion gap: 12 (ref 5–15)
BILIRUBIN TOTAL: 1 mg/dL (ref 0.3–1.2)
BUN: 6 mg/dL (ref 6–20)
CALCIUM: 9.3 mg/dL (ref 8.9–10.3)
CO2: 24 mmol/L (ref 22–32)
CREATININE: 0.79 mg/dL (ref 0.44–1.00)
Chloride: 105 mmol/L (ref 101–111)
GFR calc Af Amer: >60 mL/min (ref >60)
GLUCOSE: 184 mg/dL — AB (ref 65–99)
POTASSIUM: 3.6 mmol/L (ref 3.5–5.1)
Sodium: 141 mmol/L (ref 135–145)
TOTAL PROTEIN: 7.4 g/dL (ref 6.5–8.1)

## 2017-03-22 MED ORDER — SALINE SPRAY 0.65 % NA SOLN
1.0000 | NASAL | Status: DC | PRN
  Administered 2017-03-23: 1 via NASAL
  Filled 2017-03-22: qty 44

## 2017-03-22 MED ORDER — DIPHENHYDRAMINE HCL 25 MG PO CAPS
25.0000 mg | ORAL_CAPSULE | Freq: Four times a day (QID) | ORAL | Status: DC | PRN
  Administered 2017-03-22: 25 mg via ORAL
  Filled 2017-03-22: qty 1

## 2017-03-22 NOTE — Progress Notes (Signed)
[facility] PROGRESS NOTE  [name] ZHG:[id number] DOB: November 10, 1950 DOA: 03/16/2017  PCP: Burnis Medin, MD  Brief History/Interval Summary: 66 year old Caucasian female with a past medical history of essential hypertension, hypothyroidism, restless leg syndrome presented with abdominal pain.  Colitis end of December.  She underwent a CT scan in early January which cannot rule out a soft tissue density in that area.  She was treated with a prolonged course of antibiotics with Cipro and Flagyl.  She did feel better however her symptoms recurred last week.  She was placed on Augmentin by her gastroenterologist, Dr. Earlean Shawl.  However subsequently developed a fever and so was asked to come into the hospital.  She was hospitalized and placed on Zosyn.  Reason for Visit: Acute recurrent diverticulitis  Consultants:  Phone discussion with her gastroenterologist, Dr. Earlean Shawl.   General Surgery.  Procedures:   Transthoracic echocardiogram Study Conclusions  - Left ventricle: The cavity size was normal. Wall thickness was   normal. Systolic function was normal. The estimated ejection   fraction was in the range of 60% to 65%. The study is not   technically sufficient to allow evaluation of LV diastolic   function. - Pulmonary arteries: PA peak pressure: 33 mm Hg (S).  Antibiotics: Zosyn  Subjective/Interval History: Patient with no new complaints.  Continues to have abdominal discomfort as before.  No significant improvement.  No nausea vomiting.  Occasional loose stools.  Last night she was given hydralazine for elevated blood pressure and developed an allergic reaction.  She had flushed face and developed a rash on her chest and back.  Improved this morning.    ROS: No headaches.  Objective:  Vital Signs  Vitals:   03/21/17 2129 03/22/17 0200 03/22/17 0500 03/22/17 0530  BP: (!) 149/69 138/69  139/75  Pulse: 88 (!) 57  (!) 57  Resp: 18 18  18   Temp: 98.5 F (36.9  C) 98.1 F (36.7 C)  98.1 F (36.7 C)  TempSrc: Oral Oral  Oral  SpO2: 98% 98%  96%  Weight:   68.3 kg (150 lb 9.2 oz)   Height:        Intake/Output Summary (Last 24 hours) at 03/22/2017 0949 Last data filed at 03/22/2017 0600 Gross per 24 hour  Intake 3721.25 ml  Output 2400 ml  Net 1321.25 ml   Filed Weights   03/18/17 0500 03/21/17 0500 03/22/17 0500  Weight: 75.5 kg (166 lb 7.2 oz) 74.6 kg (164 lb 8 oz) 68.3 kg (150 lb 9.2 oz)    General appearance: Awake alert.  In no distress Resp: Clear to auscultation bilaterally.  No wheezing rales or rhonchi Cardio: S1-S2 remains bradycardic regular.  No S3-S4.  No rubs murmurs or bruit.   GI: Abdomen remains tender in the left lower quadrant.  No rebound rigidity or guarding.  No masses organomegaly.  Bowel sounds sluggish  Skin: Mild facial flushing is noted.  No rash noted on the back or chest. Extremities: No edema. Neurologic: No focal neurological deficits.  Lab Results:  Data Reviewed: I have personally reviewed following labs and imaging studies  CBC: Recent Labs  Lab 03/16/17 1213 03/16/17 1825 03/17/17 0630 03/19/17 0443 03/21/17 0522  WBC 11.2* 11.5* 7.4 5.7 4.8  HGB 13.4 12.3 11.4* 11.0* 11.4*  HCT 39.4 35.8* 33.7* 33.0* 33.8*  MCV 95.4 96.0 96.8 97.3 97.1  PLT 251 232 219 246 196    Basic Metabolic Panel: Recent Labs  Lab 03/17/17 0630 03/19/17 0443 03/20/17  1610 03/21/17 0522 03/22/17 0428  NA 140 142 138 141 141  K 3.7 3.8 3.5 3.9 3.6  CL 105 107 102 105 105  CO2 25 23 22 25 24   GLUCOSE 98 94 119* 113* 184*  BUN 14 9 6  5* 6  CREATININE 0.96 0.88 0.78 0.88 0.79  CALCIUM 8.6* 8.8* 8.8* 8.9 9.3  MG  --   --  1.7  --   --     GFR: Estimated Creatinine Clearance: 59 mL/min (by C-G formula based on SCr of 0.79 mg/dL).  Liver Function Tests: Recent Labs  Lab 03/16/17 1213 03/17/17 0630 03/22/17 0428  AST 19 15 24   ALT 25 21 33  ALKPHOS 62 48 65  BILITOT 2.1* 2.1* 1.0  PROT 7.4 6.4* 7.4    ALBUMIN 4.2 3.5 3.9    Recent Labs  Lab 03/16/17 1213  LIPASE 24    Coagulation Profile: Recent Labs  Lab 03/17/17 0630  INR 1.12    CBG: Recent Labs  Lab 03/18/17 1150 03/18/17 1635 03/18/17 2106 03/19/17 0748 03/19/17 1201  GLUCAP 74 123* 71 91 125*     Radiology Studies: No results found.   Medications:  Scheduled: . acetaminophen  1,000 mg Oral TID  . enoxaparin (LOVENOX) injection  40 mg Subcutaneous Q24H  . feeding supplement  237 mL Oral BID BM  . irbesartan  150 mg Oral Daily  . levothyroxine  112 mcg Oral QAC breakfast  . pantoprazole  40 mg Oral Daily  . rOPINIRole  0.5 mg Oral TID  . rosuvastatin  10 mg Oral Daily   Continuous: . sodium chloride 75 mL/hr at 03/22/17 0006  . piperacillin-tazobactam (ZOSYN)  IV 3.375 g (03/22/17 0919)   RUE:[id number] **OR** acetaminophen, bisacodyl, HYDROcodone-acetaminophen, ondansetron **OR** ondansetron (ZOFRAN) IV, senna-docusate, sodium phosphate  Assessment/Plan:  Active Problems:   Acute diverticulitis    Acute recurrent sigmoid diverticulitis This is patient's second episode of same within the last month and a half.  Treated with prolonged course of ciprofloxacin and metronidazole in January for her first episode.  She failed outpatient treatment with Augmentin this month.  Underwent CT scan which showed persistent diverticulitis in the sigmoid colon area. She is usually followed by Dr. Earlean Shawl,  Gastroenterologist.  Patient's case was discussed with him on 2/20.  Patient was started on Zosyn.  General surgery was consulted.  Patient has been very slow to improve.  Might need to undergo surgery.  Fatigue/Bradycardia Overall symptoms have improved.  She remains bradycardic.  EKG shows sinus bradycardia with other nonspecific T wave changes.  She is not on any rate limiting drugs.  TSH noted to be significantly elevated.  Bradycardia could be due to acute illness or due to hypothyroidism.  Echocardiogram  has been done and shows normal systolic function.  No other significant abnormalities noted.    Allergic reaction to hydralazine Hydralazine has been added to her allergy list.  Rash has improved with Benadryl.  Continue to monitor.  History of hypothyroidism/thyroidectomy TSH noted to be 16 with free T4 1.17.  Patient exhibiting some cold intolerance.  Bradycardia could be due to thyroid deficiency.  However Free T4 is slightly on the higher side.  Free T3 is pending.  Since she does have symptoms of hypothyroidism we went ahead and increased the dose of her Synthroid.  Continue to monitor for now.    Mild urinary tract infection UA was noted to be mildly abnormal.  Urine culture is negative.    Essential hypertension  Blood pressure remains stable.  Continue to monitor.  Restless leg syndrome Continue ropinirole  History of hyperlipidemia Continue home medications.  DVT Prophylaxis: Lovenox    Code Status: Full code Family Communication: Discussed with the patient Disposition Plan: Management as outlined above.  Await further surgery input.    LOS: 6 days   [facility] Pager [phone] 03/22/2017, 9:49 AM  If 7PM-7AM, please contact night-coverage at [email or web address], password [facility]

## 2017-03-22 NOTE — Progress Notes (Addendum)
[facility] Office:  [phone] General Surgery Progress Note   LOS: 6 days  POD -     Chief Complaint: Abdominal pian  Assessment and Plan: 1.  Diverticulitis  WBC - 4,800 - 03/21/2017  On Zosyn   There is not a surgical indication for urgent surgery.  But the patient is frustrated because she has been treated since the end of December with antibiotics and the antibitiotics have not resolved her diverticulitis.  I don't think that we will get the patient to the point of a pre op colonoscopy.  So the question is it worth her going home on antibiotics and scheduling surgery in 4 to 6 weeks, with a better chance of her having a one stage procedure, or proceeding with surgery now, in which she is at much higher risk of a colostomy.  (of note, her husband had a colostomy after a prostate operation, so she is familiar with ostomies)  Addendum:  I spent 30+ minutes going over the options of surgery now vs surgery later.  Husband, daughter Jeral Fruit, and friend, Clifton Custard, are in the room with the patient.  I outlined the risk of surgery including bleeding, infection, leak, ureteral injury, and probably ostomy. The risk of waiting are recurrent diverticulitis or having surgery and still requiring a colostomy.  She is going to think about it over night and give me an answer tomorrow.  2.  Thyroid replacement 3.  History of right knee surgery at [facility] in Dec 2018 4.   DVT prophylaxis - Lovenox   Active Problems:   Acute diverticulitis  Subjective:  Frustrated by course of treatment.  She is not sure that she wants more immediate surgery (and higher risk of colostomy) or delayed surgery (and risk of recurrent diverticulitis while waiting).  I will talk with her and her husband later today.  Objective:   Vitals:   03/22/17 0530 03/22/17 1205  BP: 139/75 (!) 151/66  Pulse: (!) 57 (!) 51  Resp: 18   Temp: 98.1 F (36.7 C)   SpO2: 96%      Intake/Output from previous  day:  02/24 0701 - 02/25 0700 In: 3961.3 [P.O.:960; I.V.:2751.3; IV Piggyback:250] Out: 2400 [Urine:2400]  Intake/Output this shift:  No intake/output data recorded.   Physical Exam:   General: Obese wF who is alert and oriented.    HEENT: Normal. Pupils equal. .   Lungs: Clear.   Abdomen: Ecchymosis from Lovenox.  She has no tenderness today or peritoneal signs.   Lab Results:    Recent Labs    03/21/17 0522  WBC 4.8  HGB 11.4*  HCT 33.8*  PLT 271    BMET   Recent Labs    03/21/17 0522 03/22/17 0428  NA 141 141  K 3.9 3.6  CL 105 105  CO2 25 24  GLUCOSE 113* 184*  BUN 5* 6  CREATININE 0.88 0.79  CALCIUM 8.9 9.3    PT/INR  No results for input(s): LABPROT, INR in the last 72 hours.  ABG  No results for input(s): PHART, HCO3 in the last 72 hours.  Invalid input(s): PCO2, PO2   Studies/Results:  No results found.   Anti-infectives:   Anti-infectives (From admission, onward)   Start     Dose/Rate Route Frequency Ordered Stop   03/17/17 0000  piperacillin-tazobactam (ZOSYN) IVPB 3.375 g     3.375 g 12.5 mL/hr over 240 Minutes Intravenous Every 8 hours 03/16/17 1626     03/16/17 1630  piperacillin-tazobactam (  ZOSYN) IVPB 3.375 g     3.375 g 100 mL/hr over 30 Minutes Intravenous  Once 03/16/17 1626 03/16/17 1737      Alphonsa Overall, MD, FACS Pager: [facility] Office: [phone] 03/22/2017

## 2017-03-22 NOTE — Progress Notes (Signed)
  Echocardiogram 2D Echocardiogram has been performed.  [name] 03/22/2017, 9:04 AM

## 2017-03-23 LAB — TYPE AND SCREEN
ABO/RH(D): O POS
Antibody Screen: NEGATIVE

## 2017-03-23 LAB — CBC WITH DIFFERENTIAL/PLATELET
BASOS PCT: 0 %
Basophils Absolute: 0 10*3/uL (ref 0.0–0.1)
EOS ABS: 0 10*3/uL (ref 0.0–0.7)
Eosinophils Relative: 0 %
HEMATOCRIT: 37.1 % (ref 36.0–46.0)
Hemoglobin: 12.6 g/dL (ref 12.0–15.0)
LYMPHS ABS: 2.4 10*3/uL (ref 0.7–4.0)
Lymphocytes Relative: 30 %
MCH: 32.4 pg (ref 26.0–34.0)
MCHC: 34 g/dL (ref 30.0–36.0)
MCV: 95.4 fL (ref 78.0–100.0)
MONO ABS: 0.5 10*3/uL (ref 0.1–1.0)
Monocytes Relative: 6 %
NEUTROS ABS: 5.3 10*3/uL (ref 1.7–7.7)
Neutrophils Relative %: 64 %
Platelets: 340 10*3/uL (ref 150–400)
RBC: 3.89 MIL/uL (ref 3.87–5.11)
RDW: 14 % (ref 11.5–15.5)
WBC: 8.2 10*3/uL (ref 4.0–10.5)

## 2017-03-23 LAB — BASIC METABOLIC PANEL
Anion gap: 11 (ref 5–15)
Anion gap: 11 (ref 5–15)
BUN: 13 mg/dL (ref 6–20)
BUN: 12 mg/dL (ref 6–20)
CO2: 23 mmol/L (ref 22–32)
CO2: 24 mmol/L (ref 22–32)
CREATININE: 0.88 mg/dL (ref 0.44–1.00)
CREATININE: 0.95 mg/dL (ref 0.44–1.00)
Calcium: 9 mg/dL (ref 8.9–10.3)
Calcium: 9.1 mg/dL (ref 8.9–10.3)
Chloride: 108 mmol/L (ref 101–111)
Chloride: 107 mmol/L (ref 101–111)
GFR calc Af Amer: >60 mL/min (ref >60)
GLUCOSE: 125 mg/dL — AB (ref 65–99)
GLUCOSE: 117 mg/dL — AB (ref 65–99)
POTASSIUM: 3.6 mmol/L (ref 3.5–5.1)
Potassium: 3.6 mmol/L (ref 3.5–5.1)
Sodium: 142 mmol/L (ref 135–145)
Sodium: 142 mmol/L (ref 135–145)

## 2017-03-23 LAB — CBC
HCT: 34.2 % — ABNORMAL LOW (ref 36.0–46.0)
Hemoglobin: 11.7 g/dL — ABNORMAL LOW (ref 12.0–15.0)
MCH: 33.1 pg (ref 26.0–34.0)
MCHC: 34.2 g/dL (ref 30.0–36.0)
MCV: 96.6 fL (ref 78.0–100.0)
PLATELETS: 337 10*3/uL (ref 150–400)
RBC: 3.54 MIL/uL — ABNORMAL LOW (ref 3.87–5.11)
RDW: 14 % (ref 11.5–15.5)
WBC: 8.2 10*3/uL (ref 4.0–10.5)

## 2017-03-23 LAB — MRSA PCR SCREENING: MRSA BY PCR: POSITIVE — AB

## 2017-03-23 LAB — T3, FREE: T3, Free: 1.8 pg/mL — ABNORMAL LOW (ref 2.0–4.4)

## 2017-03-23 MED ORDER — GABAPENTIN 300 MG PO CAPS: 300.0000 mg | ORAL_CAPSULE | ORAL | Status: DC

## 2017-03-23 MED ORDER — POLYETHYLENE GLYCOL 3350 17 GM/SCOOP PO POWD
238.0000 g | Freq: Once | ORAL | Status: AC
  Administered 2017-03-23: 238 g via ORAL
  Filled 2017-03-23: qty 238

## 2017-03-23 MED ORDER — METRONIDAZOLE 500 MG PO TABS
1000.0000 mg | ORAL_TABLET | ORAL | Status: AC
  Administered 2017-03-23 (×3): 1000 mg via ORAL
  Filled 2017-03-23 (×3): qty 2

## 2017-03-23 MED ORDER — ONDANSETRON HCL 4 MG/2ML IJ SOLN
4.0000 mg | Freq: Once | INTRAMUSCULAR | Status: AC
  Administered 2017-03-23: 4 mg via INTRAVENOUS
  Filled 2017-03-23: qty 2

## 2017-03-23 MED ORDER — CHLORHEXIDINE GLUCONATE CLOTH 2 % EX PADS: 6.0000 | MEDICATED_PAD | Freq: Once | CUTANEOUS | Status: DC

## 2017-03-23 MED ORDER — LIP MEDEX EX OINT
TOPICAL_OINTMENT | CUTANEOUS | Status: AC
  Filled 2017-03-23: qty 7

## 2017-03-23 MED ORDER — SODIUM CHLORIDE 0.9 % IV SOLN
2.0000 g | INTRAVENOUS | Status: AC
  Administered 2017-03-24: 2 g via INTRAVENOUS
  Filled 2017-03-23: qty 2

## 2017-03-23 MED ORDER — NEOMYCIN SULFATE 500 MG PO TABS
1000.0000 mg | ORAL_TABLET | ORAL | Status: AC
  Administered 2017-03-23 (×3): 1000 mg via ORAL
  Filled 2017-03-23 (×4): qty 2

## 2017-03-23 MED ORDER — ALVIMOPAN 12 MG PO CAPS: 12.0000 mg | ORAL_CAPSULE | ORAL | Status: DC

## 2017-03-23 MED ORDER — LIP MEDEX EX OINT
TOPICAL_OINTMENT | CUTANEOUS | Status: DC | PRN
  Administered 2017-03-23: 1 via TOPICAL

## 2017-03-23 MED ORDER — POLYETHYLENE GLYCOL 3350 17 GM/SCOOP PO POWD
1.0000 | Freq: Once | ORAL | Status: DC
  Filled 2017-03-23: qty 255

## 2017-03-23 MED ORDER — SODIUM CHLORIDE 0.9 % IV SOLN
2.0000 g | INTRAVENOUS | Status: DC
  Filled 2017-03-23: qty 2

## 2017-03-23 MED ORDER — ACETAMINOPHEN 500 MG PO TABS: 1000.0000 mg | ORAL_TABLET | ORAL | Status: DC

## 2017-03-23 NOTE — Progress Notes (Signed)
Present to do PICC.  Pt. actively doing bowel prep. For surgery.  Will follow up later. Consent obtain.

## 2017-03-23 NOTE — Progress Notes (Signed)
Dr. Lucia Gaskins in to see pt.  Pt nauseated earlier and unable to take her 2nd doses of Neomycin and Flagyl.   Zofran 4mg  IV given x 2 as ordered.  Will attempt to give po ABX again this pm.  Dr. Lucia Gaskins requested IV team to place PICC before am surgery.  Will notify IV team of request.

## 2017-03-23 NOTE — Consult Note (Signed)
Reason for Consult: Lin Diverticulitis, Request for Peri-operative Ureteral Stenting.   Referring Physician: Alphonsa Overall MD  [name] is an 67 y.o. female.   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 Her imaging does show impressive inflammation to deep pelvsi, no hydro. Cr 0.8.  PMH sig for obesity, benign hyst. No ischmic CV disease / blood thinners.   Today "[name]" is seen in consultation for above.   Past Medical History:  Diagnosis Date  . Acute pyelonephritis 05/02/2012  . Asthma   . Dyspnea    nl PFTs, and echo  . GERD (gastroesophageal reflux disease)   . Headache(784.0)   . Heart murmur   . History of kidney stones   . IBS (irritable bowel syndrome)   . Nephrolithiasis   . Pre-diabetes 12/15  . Recurrent oral ulcers   . Thyroid goiter    I 131 rx and then  total throidectomy 2005 [facility]    Past Surgical History:  Procedure Laterality Date  . ABDOMINAL HYSTERECTOMY  1994   fibroid  . APPENDECTOMY  1968  . orif left tivial plateau fracture     Dr. Collier Salina 2/08  . plates and pins take out of left knee  06/2008  . TONSILLECTOMY  1979  . TOTAL THYROIDECTOMY  2005   for  large nodule 2006    Family History  Problem Relation Age of Onset  . COPD Sister   . Coronary artery disease Sister        age 65 also copd  . Cancer Mother        bladder  . Arthritis Other   . Cancer Other        breast  . Diabetes Other   . Hyperlipidemia Other   . Hypertension Other   . Stroke Other   . Heart disease Other   . Coronary artery disease Father        also had AAA  . Aneurysm Father        Aortic and thoracic fatal    Social History:  reports that  has never smoked. she has never used smokeless tobacco. She reports that she drinks  alcohol. She reports that she does not use drugs.  Allergies:  Allergies  Allergen Reactions  . Hydralazine Anxiety    Flushing, Anxiety, Tremors  . Ciprofloxacin Nausea Only  . Propofol Other (See Comments)    Agitated, combative  . Ritalin [Methylphenidate Hcl] Other (See Comments)    "climbed the walls"  . Hydralazine Hcl Dermatitis, Rash and Other (See Comments)    Medications: I have reviewed the patient's current medications.  Results for orders placed or performed during the hospital encounter of 03/16/17 (from the past 48 hour(s))  T3, free     Status: Abnormal   Collection Time: 03/22/17  4:28 AM  Result Value Ref Range   T3, Free 1.8 (L) 2.0 - 4.4 pg/mL    Comment: (NOTE) Performed At: [facility] [place], [place] [id number] Rush Farmer MD UG:[id number] Performed at [facility], [place] [address], [place], [place] [address]   Comprehensive metabolic panel     Status: Abnormal   Collection Time: 03/22/17  4:28 AM  Result Value Ref Range   Sodium 141 135 - 145 mmol/L   Potassium 3.6 3.5 -  5.1 mmol/L   Chloride 105 101 - 111 mmol/L   CO2 24 22 - 32 mmol/L   Glucose, Bld 184 (H) 65 - 99 mg/dL   BUN 6 6 - 20 mg/dL   Creatinine, Ser 0.79 0.44 - 1.00 mg/dL   Calcium 9.3 8.9 - 10.3 mg/dL   Total Protein 7.4 6.5 - 8.1 g/dL   Albumin 3.9 3.5 - 5.0 g/dL   AST 24 15 - 41 U/L   ALT 33 14 - 54 U/L   Alkaline Phosphatase 65 38 - 126 U/L   Total Bilirubin 1.0 0.3 - 1.2 mg/dL   GFR calc non Af Amer >60 >60 mL/min   GFR calc Af Amer >60 >60 mL/min    Comment: (NOTE) The eGFR has been calculated using the CKD EPI equation. This calculation has not been validated in all clinical situations. eGFR's persistently <60 mL/min signify possible Chronic Kidney Disease.    Anion gap 12 5 - 15    Comment: Performed at [facility], [place] [address], [place], [place] [address]  Prealbumin     Status: None   Collection  Time: 03/22/17  4:28 AM  Result Value Ref Range   Prealbumin 24.8 18 - 38 mg/dL    Comment: Performed at [place] [address] [place], [place] [address]  CBC     Status: Abnormal   Collection Time: 03/23/17  4:44 AM  Result Value Ref Range   WBC 8.2 4.0 - 10.5 K/uL   RBC 3.54 (L) 3.87 - 5.11 MIL/uL   Hemoglobin 11.7 (L) 12.0 - 15.0 g/dL   HCT 34.2 (L) 36.0 - 46.0 %   MCV 96.6 78.0 - 100.0 fL   MCH 33.1 26.0 - 34.0 pg   MCHC 34.2 30.0 - 36.0 g/dL   RDW 14.0 11.5 - 15.5 %   Platelets 337 150 - 400 K/uL    Comment: Performed at [facility], [place] [address] [place], [place] [address]  Basic metabolic panel     Status: Abnormal   Collection Time: 03/23/17  4:44 AM  Result Value Ref Range   Sodium 142 135 - 145 mmol/L   Potassium 3.6 3.5 - 5.1 mmol/L   Chloride 108 101 - 111 mmol/L   CO2 23 22 - 32 mmol/L   Glucose, Bld 125 (H) 65 - 99 mg/dL   BUN 13 6 - 20 mg/dL   Creatinine, Ser 0.88 0.44 - 1.00 mg/dL   Calcium 9.0 8.9 - 10.3 mg/dL   GFR calc non Af Amer >60 >60 mL/min   GFR calc Af Amer >60 >60 mL/min    Comment: (NOTE) The eGFR has been calculated using the CKD EPI equation. This calculation has not been validated in all clinical situations. eGFR's persistently <60 mL/min signify possible Chronic Kidney Disease.    Anion gap 11 5 - 15    Comment: Performed at [facility], [place] [address], [place], [place] [address]  Type and screen [place]     Status: None   Collection Time: 03/23/17  8:30 AM  Result Value Ref Range   ABO/RH(D) O POS    Antibody Screen NEG    Sample Expiration      03/26/2017 Performed at [facility], [place] [address], [place], [place] [address]   CBC WITH DIFFERENTIAL     Status: None   Collection Time: 03/23/17  8:40 AM  Result Value Ref Range  WBC 8.2 4.0 - 10.5 K/uL   RBC 3.89 3.87 - 5.11 MIL/uL   Hemoglobin 12.6 12.0 - 15.0 g/dL   HCT 37.1 36.0 -  46.0 %   MCV 95.4 78.0 - 100.0 fL   MCH 32.4 26.0 - 34.0 pg   MCHC 34.0 30.0 - 36.0 g/dL   RDW 14.0 11.5 - 15.5 %   Platelets 340 150 - 400 K/uL   Neutrophils Relative % 64 %   Neutro Abs 5.3 1.7 - 7.7 K/uL   Lymphocytes Relative 30 %   Lymphs Abs 2.4 0.7 - 4.0 K/uL   Monocytes Relative 6 %   Monocytes Absolute 0.5 0.1 - 1.0 K/uL   Eosinophils Relative 0 %   Eosinophils Absolute 0.0 0.0 - 0.7 K/uL   Basophils Relative 0 %   Basophils Absolute 0.0 0.0 - 0.1 K/uL    Comment: Performed at [facility], [place] [address], [place], [place] [address]  Basic metabolic panel     Status: Abnormal   Collection Time: 03/23/17  8:40 AM  Result Value Ref Range   Sodium 142 135 - 145 mmol/L   Potassium 3.6 3.5 - 5.1 mmol/L   Chloride 107 101 - 111 mmol/L   CO2 24 22 - 32 mmol/L   Glucose, Bld 117 (H) 65 - 99 mg/dL   BUN 12 6 - 20 mg/dL   Creatinine, Ser 0.95 0.44 - 1.00 mg/dL   Calcium 9.1 8.9 - 10.3 mg/dL   GFR calc non Af Amer >60 >60 mL/min   GFR calc Af Amer >60 >60 mL/min    Comment: (NOTE) The eGFR has been calculated using the CKD EPI equation. This calculation has not been validated in all clinical situations. eGFR's persistently <60 mL/min signify possible Chronic Kidney Disease.    Anion gap 11 5 - 15    Comment: Performed at [facility], [place] [address] [place], [place] [address]    No results found.  Review of Systems  Constitutional: Positive for malaise/fatigue.  HENT: Negative.   Eyes: Negative.   Respiratory: Negative.   Cardiovascular: Negative.   Gastrointestinal: Positive for abdominal pain and nausea.  Genitourinary: Negative.  Negative for flank pain and hematuria.  Musculoskeletal: Negative.   Skin: Negative.   Neurological: Negative.   Endo/Heme/Allergies: Negative.   Psychiatric/Behavioral: Negative.    Blood pressure (!) 158/76, pulse (!) 55, temperature 98.1 F (36.7 C), temperature source Oral, resp. rate 18,  height 4' 11.5" (1.511 m), weight 68.8 kg (151 lb 10.8 oz), SpO2 97 %. Physical Exam  Constitutional: She appears well-developed.  HENT:  Head: Normocephalic.  Eyes: Pupils are equal, round, and reactive to light.  Neck: Normal range of motion.  Cardiovascular: Normal rate.  Respiratory: [place] normal.  GI:  Moderate truncal obesity.   Genitourinary:  Genitourinary Comments: No CVAT  Musculoskeletal: Normal range of motion.  Neurological: She is alert.  Skin: Skin is warm.  Psychiatric: She has a normal mood and affect.    Assessment/Plan:  1 - Lin Diverticulitis, Request for Peri-operative Ureteral Stenting - agree with plan for peri-op externalized stents / retrogrades tomorrow. I plan to perform this personally. Risks, benefits, rationale, expected peri-op course discussed.     [name] 03/23/2017, 11:41 AM

## 2017-03-23 NOTE — Progress Notes (Addendum)
[facility] Progress Note     Subjective: CC: diverticulitis Patient discussed with family last night and has decided to proceed with surgery. She currently reports that pain is well controlled. She has had 2 bowel movements overnight, not grossly bloody. She has been tolerating a FLD.  UOP good. VSS.   Objective: Vital signs in last 24 hours: Temp:  [97.8 F (36.6 C)-98.6 F (37 C)] 97.8 F (36.6 C) (02/26 0503) Pulse Rate:  [51-60] 57 (02/26 0503) Resp:  [16-18] 18 (02/26 0503) BP: (147-157)/(66-85) 156/72 (02/26 0503) SpO2:  [96 %-98 %] 97 % (02/26 0503) Weight:  [68.8 kg (151 lb 10.8 oz)] 68.8 kg (151 lb 10.8 oz) (02/26 0500) Last BM Date: 03/22/17  Intake/Output from previous day: 02/25 0701 - 02/26 0700 In: 1781.3 [I.V.:1631.3; IV Piggyback:150] Out: 0  Intake/Output this shift: No intake/output data recorded.  PE: Gen:  Alert, NAD, pleasant Card:  Regular rate and rhythm, pedal pulses 2+ BL Pulm:  Normal effort, clear to auscultation bilaterally Abd: Soft, non-tender, non-distended, bowel sounds present, no HSM Skin: warm and dry, no rashes  Psych: A&Ox3   Lab Results:  Recent Labs    03/21/17 0522 03/23/17 0444  WBC 4.8 8.2  HGB 11.4* 11.7*  HCT 33.8* 34.2*  PLT 271 337   BMET Recent Labs    03/22/17 0428 03/23/17 0444  NA 141 142  K 3.6 3.6  CL 105 108  CO2 24 23  GLUCOSE 184* 125*  BUN 6 13  CREATININE 0.79 0.88  CALCIUM 9.3 9.0   PT/INR No results for input(s): LABPROT, INR in the last 72 hours. CMP     Component Value Date/Time   NA 142 03/23/2017 0444   K 3.6 03/23/2017 0444   CL 108 03/23/2017 0444   CO2 23 03/23/2017 0444   GLUCOSE 125 (H) 03/23/2017 0444   BUN 13 03/23/2017 0444   CREATININE 0.88 03/23/2017 0444   CALCIUM 9.0 03/23/2017 0444   CALCIUM 8.8 11/17/2006 2239   PROT 7.4 03/22/2017 0428   ALBUMIN 3.9 03/22/2017 0428   AST 24 03/22/2017 0428   ALT 33 03/22/2017 0428   ALKPHOS 65 03/22/2017 0428   BILITOT 1.0 03/22/2017 0428   GFRNONAA >60 03/23/2017 0444   GFRAA >60 03/23/2017 0444   Lipase     Component Value Date/Time   LIPASE 24 03/16/2017 1213       Studies/Results: No results found.  Anti-infectives: Anti-infectives (From admission, onward)   Start     Dose/Rate Route Frequency Ordered Stop   03/17/17 0000  piperacillin-tazobactam (ZOSYN) IVPB 3.375 g     3.375 g 12.5 mL/hr over 240 Minutes Intravenous Every 8 hours 03/16/17 1626     03/16/17 1630  piperacillin-tazobactam (ZOSYN) IVPB 3.375 g     3.375 g 100 mL/hr over 30 Minutes Intravenous  Once 03/16/17 1626 03/16/17 1737       Assessment/Plan Hypothyroidism Hx of R knee surgery at [facility] 12/2016  Diverticulitis - WBC 8.2, afebrile - patient has decided to proceed with surgical management for diverticulitis - bowel prep today, pre-op protocol - WOC to mark patient  - plan for OR tomorrow  FEN: CLD, IVF VTE: SCDs, lovenox ID: IV zosyn 2/19>>   LOS: 7 days    Brigid Re , [facility] 03/23/2017, 7:33 AM Pager: [phone] Consults: [phone]  Agree with above.  I spoke with Dr. Tresa Moore about placement of bilateral ureteral stents at the time of surgery.  I spent a  long time yesterday going over the pros and cons of surgery now.  She is ready to go ahead with surgery.  Alphonsa Overall, MD, [facility] Pager: [phone] Office phone:  [phone]

## 2017-03-23 NOTE — Consult Note (Signed)
[place] Nurse ostomy consult note [place] Nurse requested for preoperative stoma site marking by Dr. Lucia Gaskins.  Discussed surgical procedure and stoma creation with patient and family.  Explained role of the [place] nurse team.  Patient defers educational materials until she knows if she will have a stoma. Her husband and mother have had stomas. She is currently weak and nauseated from bowel prep.  Examined patient sitting and standing in order to place the marking in the patient's visual field, away from any creases or abdominal contour issues and within the rectus muscle. Two sites are selected.  The superior site (LUQ) is preferred as patient is able to see it.  The inferior site (LLQ) will also suffice, but patient will have some difficulty viewing ostomy.  Site #1: Marked for colostomy in the LUQ  5.5cm to the left of the umbilicus and 4cm above the umbilicus.  Site #2: Marked for colostomy in the LLQ  6cm to the left of the umbilicus and 2cm below the umbilicus.    Patient's abdomen cleansed with CHG wipes at site markings, allowed to air dry prior to marking.Covered mark with thin film transparent dressing to preserve mark until date of surgery (tomorrow, 2/27).   [place] nursing team will not follow, but will remain available to this patient, the nursing and medical teams.  Please re-consult if an ostomy is created intraoperatively. Thanks, Maudie Flakes, MSN, RN, [place], Arther Abbott  Pager# [phone]

## 2017-03-23 NOTE — Progress Notes (Addendum)
Lab notified RN that patient was MRSA positive. Contact precautions were initiated.

## 2017-03-23 NOTE — Progress Notes (Signed)
[facility] PROGRESS NOTE  [name] HFW:[id number] DOB: March 07, 1950 DOA: 03/16/2017  PCP: Burnis Medin, MD  Brief History/Interval Summary: 67 year old Caucasian female with a past medical history of essential hypertension, hypothyroidism, restless leg syndrome presented with abdominal pain.  Colitis end of December.  She underwent a CT scan in early January which cannot rule out a soft tissue density in that area.  She was treated with a prolonged course of antibiotics with Cipro and Flagyl.  She did feel better however her symptoms recurred last week.  She was placed on Augmentin by her gastroenterologist, Dr. Earlean Shawl.  However subsequently developed a fever and so was asked to come into the hospital.  She was hospitalized and placed on Zosyn.  Plan is for her to undergo surgery tomorrow, 2/27.  Reason for Visit: Acute recurrent diverticulitis  Consultants:  Phone discussion with her gastroenterologist, Dr. Earlean Shawl.   General Surgery.  Procedures:   Transthoracic echocardiogram Study Conclusions  - Left ventricle: The cavity size was normal. Wall thickness was   normal. Systolic function was normal. The estimated ejection   fraction was in the range of 60% to 65%. The study is not   technically sufficient to allow evaluation of LV diastolic   function. - Pulmonary arteries: PA peak pressure: 33 mm Hg (S).  Antibiotics: Zosyn  Subjective/Interval History: Patient with no new complaints.  She has decided to undergo surgery which is being planned for tomorrow.  Continues to have abdominal discomfort.  No nausea vomiting.  Loose stools.  No recurrence of rash.  ROS: Denies any headaches.  Objective:  Vital Signs  Vitals:   03/22/17 1506 03/22/17 2148 03/23/17 0500 03/23/17 0503  BP: (!) 157/69 (!) 147/85  (!) 156/72  Pulse: (!) 52 60  (!) 57  Resp: 16 18  18   Temp: 98.6 F (37 C) 97.9 F (36.6 C)  97.8 F (36.6 C)  TempSrc: Oral Oral  Oral  SpO2: 98%  96%  97%  Weight:   68.8 kg (151 lb 10.8 oz)   Height:        Intake/Output Summary (Last 24 hours) at 03/23/2017 0943 Last data filed at 03/23/2017 0600 Gross per 24 hour  Intake 1781.25 ml  Output 0 ml  Net 1781.25 ml   Filed Weights   03/21/17 0500 03/22/17 0500 03/23/17 0500  Weight: 74.6 kg (164 lb 8 oz) 68.3 kg (150 lb 9.2 oz) 68.8 kg (151 lb 10.8 oz)    General appearance: Awake alert.  In no distress. Resp: Clear to auscultation bilaterally.  No wheezing rales or rhonchi Cardio: S1-S2 remains mildly bradycardic.  Regular.  No S3-S4. GI: Abdomen remains soft but tender in the left lower quadrant without any rebound rigidity or guarding.  No masses organomegaly.  Bowel sounds present but sluggish.   Skin: Mild facial flushing is noted.  No rash on chest or back. Extremities: No edema. Neurologic: No focal neurological deficits.  Lab Results:  Data Reviewed: I have personally reviewed following labs and imaging studies  CBC: Recent Labs  Lab 03/17/17 0630 03/19/17 0443 03/21/17 0522 03/23/17 0444 03/23/17 0840  WBC 7.4 5.7 4.8 8.2 8.2  NEUTROABS  --   --   --   --  5.3  HGB 11.4* 11.0* 11.4* 11.7* 12.6  HCT 33.7* 33.0* 33.8* 34.2* 37.1  MCV 96.8 97.3 97.1 96.6 95.4  PLT 219 246 271 337 027    Basic Metabolic Panel: Recent Labs  Lab 03/20/17 0953 03/21/17 0522 03/22/17  6606 03/23/17 0444 03/23/17 0840  NA 138 141 141 142 142  K 3.5 3.9 3.6 3.6 3.6  CL 102 105 105 108 107  CO2 22 25 24 23 24   GLUCOSE 119* 113* 184* 125* 117*  BUN 6 5* 6 13 12   CREATININE 0.78 0.88 0.79 0.88 0.95  CALCIUM 8.8* 8.9 9.3 9.0 9.1  MG 1.7  --   --   --   --     GFR: Estimated Creatinine Clearance: 49.8 mL/min (by C-G formula based on SCr of 0.95 mg/dL).  Liver Function Tests: Recent Labs  Lab 03/16/17 1213 03/17/17 0630 03/22/17 0428  AST 19 15 24   ALT 25 21 33  ALKPHOS 62 48 65  BILITOT 2.1* 2.1* 1.0  PROT 7.4 6.4* 7.4  ALBUMIN 4.2 3.5 3.9    Recent Labs    Lab 03/16/17 1213  LIPASE 24    Coagulation Profile: Recent Labs  Lab 03/17/17 0630  INR 1.12    CBG: Recent Labs  Lab 03/18/17 1150 03/18/17 1635 03/18/17 2106 03/19/17 0748 03/19/17 1201  GLUCAP 74 123* 71 91 125*     Radiology Studies: No results found.   Medications:  Scheduled: . acetaminophen  1,000 mg Oral TID  . feeding supplement  237 mL Oral BID BM  . irbesartan  150 mg Oral Daily  . levothyroxine  112 mcg Oral QAC breakfast  . neomycin  1,000 mg Oral 3 times per day   And  . metroNIDAZOLE  1,000 mg Oral 3 times per day  . pantoprazole  40 mg Oral Daily  . polyethylene glycol powder  1 Container Oral Once  . rOPINIRole  0.5 mg Oral TID  . rosuvastatin  10 mg Oral Daily   Continuous: . sodium chloride 75 mL/hr at 03/22/17 2113  . [START ON 03/24/2017] cefoTEtan (CEFOTAN) IV    . piperacillin-tazobactam (ZOSYN)  IV 3.375 g (03/23/17 0835)   TKZ:SWFUXNATF, diphenhydrAMINE, HYDROcodone-acetaminophen, ondansetron **OR** ondansetron (ZOFRAN) IV, senna-docusate, sodium chloride, sodium phosphate  Assessment/Plan:  Active Problems:   Acute diverticulitis    Acute recurrent sigmoid diverticulitis This is patient's second episode of same within the last month and a half.  Treated with prolonged course of ciprofloxacin and metronidazole in January for her first episode.  She failed outpatient treatment with Augmentin this month.  Underwent CT scan which showed persistent diverticulitis in the sigmoid colon area. She is usually followed by Dr. Earlean Shawl,  Gastroenterologist.  Patient's case was discussed with him on 2/20.  Patient was started on Zosyn.  General surgery was consulted.  Patient has been very slow to improve.  Plan is for surgery on 2/27.  Fatigue/Bradycardia She remains bradycardic. She is asymptomatic.  EKG showed sinus bradycardia with other nonspecific T wave changes.  She is not on any rate limiting drugs.  TSH noted to be significantly  elevated.  See below for further discussion.  Bradycardia could be due to acute illness or due to hypothyroidism.  Echocardiogram has been done and shows normal systolic function.  No other significant abnormalities noted.    Allergic reaction to hydralazine Hydralazine has been added to her allergy list.  Rash has improved with Benadryl.  Continue to monitor.  History of hypothyroidism/thyroidectomy She is status post thyroidectomy in 2006 for goiter.  TSH noted to be 16 with free T4 1.17.  Patient exhibiting some cold intolerance.  Bradycardia could be due to thyroid deficiency.  However Free T4 slightly on the higher side.  Free T3 is  slightly low at 1.8.  Patient does mention symptoms such as cold intolerance and has bradycardia which could all be due to hypothyroidism.  Her dose of levothyroxine was increased.  I think patient will benefit from T3 replacement as well in the form of Cytomel.  This was discussed with the patient. Plan will be to initiate Cytomel along with continuing the higher dose of levothyroxine postoperatively.  She will benefit from intravenous levothyroxine if she remains n.p.o for a long period of time after surgery.      Mild urinary tract infection UA was noted to be mildly abnormal.  Urine culture was negative.    Essential hypertension Blood pressure mildly hypotensive but stable.  Continue to monitor.  Continue current medications.  Allergic reaction to hydralazine noted.  Restless leg syndrome Continue ropinirole  History of hyperlipidemia Continue home medications.  DVT Prophylaxis: Lovenox    Code Status: Full code Family Communication: Discussed with the patient Disposition Plan: Management as outlined above.  Surgery is planned for tomorrow.    LOS: 7 days   [facility] Pager [phone] 03/23/2017, 9:43 AM  If 7PM-7AM, please contact night-coverage at [email or web address], password [facility]

## 2017-03-24 ENCOUNTER — Encounter (HOSPITAL_COMMUNITY)
Admission: EM | Disposition: A | Discharge status: Home / Self Care | Payer: Self-pay | Source: Home / Self Care | Attending: Internal Medicine

## 2017-03-24 ENCOUNTER — Inpatient Hospital Stay (HOSPITAL_COMMUNITY): Payer: Medicare Other

## 2017-03-24 ENCOUNTER — Encounter (HOSPITAL_COMMUNITY): Payer: Self-pay | Admitting: Certified Registered Nurse Anesthetist

## 2017-03-24 ENCOUNTER — Inpatient Hospital Stay (HOSPITAL_COMMUNITY): Payer: Medicare Other | Admitting: Anesthesiology

## 2017-03-24 DIAGNOSIS — K5792 Diverticulitis of intestine, part unspecified, without perforation or abscess without bleeding: Secondary | ICD-10-CM

## 2017-03-24 HISTORY — PX: CYSTOSCOPY WITH STENT PLACEMENT: SHX5790

## 2017-03-24 HISTORY — PX: LAPAROSCOPIC PARTIAL COLECTOMY: SHX5907

## 2017-03-24 HISTORY — PX: CYSTOSCOPY/RETROGRADE/URETEROSCOPY: SHX5316

## 2017-03-24 HISTORY — PX: SIGMOIDOSCOPY: SHX6686

## 2017-03-24 HISTORY — DX: Diverticulitis of intestine, part unspecified, without perforation or abscess without bleeding: K57.92

## 2017-03-24 LAB — HEMOGLOBIN A1C
HEMOGLOBIN A1C: 6.1 % — AB (ref 4.8–5.6)
Mean Plasma Glucose: 128 mg/dL

## 2017-03-24 SURGERY — LAPAROSCOPIC PARTIAL COLECTOMY
Anesthesia: General

## 2017-03-24 MED ORDER — SUGAMMADEX SODIUM 500 MG/5ML IV SOLN
INTRAVENOUS | Status: AC
  Filled 2017-03-24: qty 15

## 2017-03-24 MED ORDER — BUPIVACAINE-EPINEPHRINE (PF) 0.25% -1:200000 IJ SOLN
INTRAMUSCULAR | Status: DC | PRN
  Administered 2017-03-24: 30 mL

## 2017-03-24 MED ORDER — ALVIMOPAN 12 MG PO CAPS
12.0000 mg | ORAL_CAPSULE | ORAL | Status: AC
  Administered 2017-03-24: 12 mg via ORAL
  Filled 2017-03-24: qty 1

## 2017-03-24 MED ORDER — SUGAMMADEX SODIUM 200 MG/2ML IV SOLN
INTRAVENOUS | Status: DC | PRN
  Administered 2017-03-24: 300 mg via INTRAVENOUS

## 2017-03-24 MED ORDER — SODIUM CHLORIDE 0.9% FLUSH
10.0000 mL | INTRAVENOUS | Status: DC | PRN
  Administered 2017-03-25 – 2017-03-30 (×5): 10 mL
  Filled 2017-03-24 (×5): qty 40

## 2017-03-24 MED ORDER — SCOPOLAMINE 1 MG/3DAYS TD PT72SCOPOLAMINE 1 MG/3DAYS
1.0000 | MEDICATED_PATCH | TRANSDERMAL | Status: DC
Start: 2017-03-24 — End: 2017-03-31
  Administered 2017-03-27 – 2017-03-30 (×2): 1.5 mg via TRANSDERMAL
  Filled 2017-03-24 (×3): qty 1

## 2017-03-24 MED ORDER — MUPIROCIN 2 % EX OINT
1.0000 "application " | TOPICAL_OINTMENT | Freq: Two times a day (BID) | CUTANEOUS | Status: AC
  Administered 2017-03-24 – 2017-03-28 (×7): 1 via NASAL
  Filled 2017-03-24 (×4): qty 22

## 2017-03-24 MED ORDER — PROPOFOL 10 MG/ML IV BOLUS
INTRAVENOUS | Status: DC | PRN
  Administered 2017-03-24: 150 mg via INTRAVENOUS

## 2017-03-24 MED ORDER — CEFOTETAN DISODIUM-DEXTROSE 2-2.08 GM-%(50ML) IV SOLR: 2.0000 g | Freq: Once | INTRAVENOUS | Status: DC

## 2017-03-24 MED ORDER — LIDOCAINE 2% (20 MG/ML) 5 ML SYRINGE
INTRAMUSCULAR | Status: DC | PRN
  Administered 2017-03-24: 80 mg via INTRAVENOUS

## 2017-03-24 MED ORDER — LIDOCAINE 2% (20 MG/ML) 5 ML SYRINGE
INTRAMUSCULAR | Status: AC
  Filled 2017-03-24: qty 5

## 2017-03-24 MED ORDER — PIPERACILLIN-TAZOBACTAM 3.375 G IVPB
3.3750 g | Freq: Three times a day (TID) | INTRAVENOUS | Status: AC
  Administered 2017-03-24 – 2017-03-26 (×6): 3.375 g via INTRAVENOUS
  Filled 2017-03-24 (×5): qty 50

## 2017-03-24 MED ORDER — ALBUTEROL SULFATE HFA 108 (90 BASE) MCG/ACT IN AERS
INHALATION_SPRAY | RESPIRATORY_TRACT | Status: AC
  Filled 2017-03-24: qty 6.7

## 2017-03-24 MED ORDER — ROCURONIUM BROMIDE 10 MG/ML (PF) SYRINGE
PREFILLED_SYRINGE | INTRAVENOUS | Status: DC | PRN
  Administered 2017-03-24: 60 mg via INTRAVENOUS
  Administered 2017-03-24: 20 mg via INTRAVENOUS

## 2017-03-24 MED ORDER — ONDANSETRON HCL 4 MG/2ML IJ SOLN
INTRAMUSCULAR | Status: DC | PRN
  Administered 2017-03-24 (×2): 4 mg via INTRAVENOUS

## 2017-03-24 MED ORDER — FENTANYL CITRATE (PF) 250 MCG/5ML IJ SOLN
INTRAMUSCULAR | Status: AC
  Filled 2017-03-24: qty 5

## 2017-03-24 MED ORDER — LIDOCAINE 2% (20 MG/ML) 5 ML SYRINGE
INTRAMUSCULAR | Status: AC
  Filled 2017-03-24: qty 10

## 2017-03-24 MED ORDER — PROMETHAZINE HCL 25 MG/ML IJ SOLN: 6.2500 mg | INTRAMUSCULAR | Status: DC | PRN

## 2017-03-24 MED ORDER — LACTATED RINGERS IV SOLN
INTRAVENOUS | Status: DC | PRN
  Administered 2017-03-24: 11:00:00 via INTRAVENOUS

## 2017-03-24 MED ORDER — BUPIVACAINE LIPOSOME 1.3 % IJ SUSP
20.0000 mL | Freq: Once | INTRAMUSCULAR | Status: AC
  Administered 2017-03-24: 20 mL
  Filled 2017-03-24 (×2): qty 20

## 2017-03-24 MED ORDER — ROCURONIUM BROMIDE 10 MG/ML (PF) SYRINGE
PREFILLED_SYRINGE | INTRAVENOUS | Status: AC
  Filled 2017-03-24: qty 5

## 2017-03-24 MED ORDER — ALVIMOPAN 12 MG PO CAPS
12.0000 mg | ORAL_CAPSULE | Freq: Two times a day (BID) | ORAL | Status: DC
  Filled 2017-03-24: qty 1

## 2017-03-24 MED ORDER — EPHEDRINE 5 MG/ML INJ
INTRAVENOUS | Status: AC
  Filled 2017-03-24: qty 10

## 2017-03-24 MED ORDER — LACTATED RINGERS IR SOLN
Status: DC | PRN
  Administered 2017-03-24: 2000 mL

## 2017-03-24 MED ORDER — MIDAZOLAM HCL 2 MG/2ML IJ SOLN
INTRAMUSCULAR | Status: AC
  Filled 2017-03-24: qty 2

## 2017-03-24 MED ORDER — 0.9 % SODIUM CHLORIDE (POUR BTL) OPTIME
TOPICAL | Status: DC | PRN
  Administered 2017-03-24: 3000 mL

## 2017-03-24 MED ORDER — FENTANYL CITRATE (PF) 100 MCG/2ML IJ SOLN
25.0000 ug | INTRAMUSCULAR | Status: DC | PRN
  Administered 2017-03-24 (×4): 25 ug via INTRAVENOUS
  Administered 2017-03-24: 50 ug via INTRAVENOUS

## 2017-03-24 MED ORDER — BUPIVACAINE-EPINEPHRINE 0.25% -1:200000 IJ SOLN
INTRAMUSCULAR | Status: AC
  Filled 2017-03-24: qty 1

## 2017-03-24 MED ORDER — ONDANSETRON HCL 4 MG/2ML IJ SOLN
INTRAMUSCULAR | Status: AC
  Filled 2017-03-24: qty 2

## 2017-03-24 MED ORDER — EPHEDRINE SULFATE-NACL 50-0.9 MG/10ML-% IV SOSY
PREFILLED_SYRINGE | INTRAVENOUS | Status: DC | PRN
  Administered 2017-03-24: 10 mg via INTRAVENOUS
  Administered 2017-03-24: 5 mg via INTRAVENOUS

## 2017-03-24 MED ORDER — ALVIMOPAN 12 MG PO CAPS: 12.0000 mg | ORAL_CAPSULE | ORAL | Status: DC

## 2017-03-24 MED ORDER — MORPHINE SULFATE (PF) 2 MG/ML IV SOLN
1.0000 mg | INTRAVENOUS | Status: DC | PRN
  Administered 2017-03-24 – 2017-03-26 (×8): 2 mg via INTRAVENOUS
  Administered 2017-03-26: 1 mg via INTRAVENOUS
  Filled 2017-03-24 (×9): qty 1

## 2017-03-24 MED ORDER — ALPRAZOLAM 0.5 MG PO TABS
0.5000 mg | ORAL_TABLET | Freq: Once | ORAL | Status: AC
  Administered 2017-03-24: 0.5 mg via ORAL
  Filled 2017-03-24: qty 1

## 2017-03-24 MED ORDER — IOHEXOL 300 MG/ML  SOLN
INTRAMUSCULAR | Status: DC | PRN
  Administered 2017-03-24: 15 mL via URETHRAL

## 2017-03-24 MED ORDER — ACETAMINOPHEN 10 MG/ML IV SOLN
1000.0000 mg | Freq: Four times a day (QID) | INTRAVENOUS | Status: AC
  Administered 2017-03-25 (×3): 1000 mg via INTRAVENOUS
  Filled 2017-03-24 (×4): qty 100

## 2017-03-24 MED ORDER — LIDOCAINE 2% (20 MG/ML) 5 ML SYRINGE
INTRAMUSCULAR | Status: DC | PRN
  Administered 2017-03-24: 1.5 mg/kg/h via INTRAVENOUS

## 2017-03-24 MED ORDER — FENTANYL CITRATE (PF) 250 MCG/5ML IJ SOLN
INTRAMUSCULAR | Status: DC | PRN
  Administered 2017-03-24: 50 ug via INTRAVENOUS
  Administered 2017-03-24 (×2): 100 ug via INTRAVENOUS

## 2017-03-24 MED ORDER — FENTANYL CITRATE (PF) 100 MCG/2ML IJ SOLN
INTRAMUSCULAR | Status: AC
  Filled 2017-03-24: qty 4

## 2017-03-24 MED ORDER — ROPINIROLE HCL 0.5 MG PO TABS
0.5000 mg | ORAL_TABLET | Freq: Once | ORAL | Status: AC
  Administered 2017-03-24: 0.5 mg via ORAL
  Filled 2017-03-24: qty 1

## 2017-03-24 MED ORDER — MIDAZOLAM HCL 5 MG/5ML IJ SOLN
INTRAMUSCULAR | Status: DC | PRN
  Administered 2017-03-24: 2 mg via INTRAVENOUS

## 2017-03-24 MED ORDER — DEXAMETHASONE SODIUM PHOSPHATE 10 MG/ML IJ SOLN
INTRAMUSCULAR | Status: DC | PRN
  Administered 2017-03-24: 10 mg via INTRAVENOUS

## 2017-03-24 MED ORDER — KCL IN DEXTROSE-NACL 20-5-0.45 MEQ/L-%-% IV SOLN
INTRAVENOUS | Status: DC
  Administered 2017-03-24 – 2017-03-26 (×6): via INTRAVENOUS
  Filled 2017-03-24 (×8): qty 1000

## 2017-03-24 MED ORDER — ACETAMINOPHEN 500 MG PO TABS
1000.0000 mg | ORAL_TABLET | Freq: Once | ORAL | Status: AC
  Administered 2017-03-24: 1000 mg via ORAL
  Filled 2017-03-24: qty 2

## 2017-03-24 MED ORDER — CHLORHEXIDINE GLUCONATE CLOTH 2 % EX PADS
6.0000 | MEDICATED_PAD | Freq: Every day | CUTANEOUS | Status: AC
  Administered 2017-03-24 – 2017-03-28 (×4): 6 via TOPICAL

## 2017-03-24 MED ORDER — PROPOFOL 10 MG/ML IV BOLUS
INTRAVENOUS | Status: AC
  Filled 2017-03-24: qty 20

## 2017-03-24 MED ORDER — GABAPENTIN 300 MG PO CAPS
300.0000 mg | ORAL_CAPSULE | Freq: Two times a day (BID) | ORAL | Status: DC
  Administered 2017-03-24: 300 mg via ORAL
  Filled 2017-03-24: qty 1

## 2017-03-24 MED ORDER — CHLORHEXIDINE GLUCONATE CLOTH 2 % EX PADS: 6.0000 | MEDICATED_PAD | Freq: Two times a day (BID) | CUTANEOUS | Status: DC

## 2017-03-24 SURGICAL SUPPLY — 67 items
APPLICATOR COTTON TIP 6IN STRL (MISCELLANEOUS) ×10 IMPLANT
BAG URO CATCHER STRL LF (MISCELLANEOUS) ×5 IMPLANT
BASKET ZERO TIP NITINOL 2.4FR (BASKET) IMPLANT
BLADE EXTENDED COATED 6.5IN (ELECTRODE) IMPLANT
BLADE HEX COATED 2.75 (ELECTRODE) ×5 IMPLANT
BLADE SURG SZ10 CARB STEEL (BLADE) ×5 IMPLANT
BSKT STON RTRVL ZERO TP 2.4FR (BASKET)
CATH INTERMIT  6FR 70CM (CATHETERS) ×5 IMPLANT
CATH URET 5FR 28IN OPEN ENDED (CATHETERS) ×5 IMPLANT
CLIP VESOCCLUDE LG 6/CT (CLIP) IMPLANT
CLOTH BEACON ORANGE TIMEOUT ST (SAFETY) ×5 IMPLANT
COVER FOOTSWITCH UNIV (MISCELLANEOUS) IMPLANT
COVER MAYO STAND STRL (DRAPES) ×5 IMPLANT
COVER SURGICAL LIGHT HANDLE (MISCELLANEOUS) ×10 IMPLANT
DRAPE LAPAROSCOPIC ABDOMINAL (DRAPES) ×5 IMPLANT
DRAPE SHEET LG 3/4 BI-LAMINATE (DRAPES) IMPLANT
DRAPE UTILITY W/TAPE 26X15 (DRAPES) ×5 IMPLANT
DRAPE WARM FLUID 44X44 (DRAPE) ×5 IMPLANT
DRSG OPSITE POSTOP 4X8 (GAUZE/BANDAGES/DRESSINGS) ×5 IMPLANT
ELECT REM PT RETURN 15FT ADLT (MISCELLANEOUS) ×5 IMPLANT
GAUZE SPONGE 4X4 12PLY STRL (GAUZE/BANDAGES/DRESSINGS) ×5 IMPLANT
GLOVE BIOGEL M STRL SZ7.5 (GLOVE) ×5 IMPLANT
GLOVE BIOGEL PI IND STRL 7.0 (GLOVE) ×4 IMPLANT
GLOVE BIOGEL PI INDICATOR 7.0 (GLOVE) ×1
GOWN SPEC L4 XLG W/TWL (GOWN DISPOSABLE) ×5 IMPLANT
GOWN STRL REUS W/ TWL XL LVL3 (GOWN DISPOSABLE) ×12 IMPLANT
GOWN STRL REUS W/TWL LRG LVL3 (GOWN DISPOSABLE) ×15 IMPLANT
GOWN STRL REUS W/TWL XL LVL3 (GOWN DISPOSABLE) ×15
GRASPER ENDOPATH ANVIL 10MM (MISCELLANEOUS) ×5 IMPLANT
GUIDEWIRE ANG ZIPWIRE 038X150 (WIRE) ×5 IMPLANT
GUIDEWIRE STR DUAL SENSOR (WIRE) ×5 IMPLANT
HANDLE SUCTION POOLE (INSTRUMENTS) ×4 IMPLANT
KIT BASIN OR (CUSTOM PROCEDURE TRAY) ×5 IMPLANT
LEGGING LITHOTOMY PAIR STRL (DRAPES) ×5 IMPLANT
MANIFOLD NEPTUNE II (INSTRUMENTS) ×5 IMPLANT
NS IRRIG 1000ML POUR BTL (IV SOLUTION) ×10 IMPLANT
PACK CYSTO (CUSTOM PROCEDURE TRAY) ×5 IMPLANT
PACK GENERAL/GYN (CUSTOM PROCEDURE TRAY) ×5 IMPLANT
PORT LAP GEL ALEXIS MED 5-9CM (MISCELLANEOUS) ×5 IMPLANT
RELOAD STAPLER GREEN 60MM (STAPLE) ×8 IMPLANT
SHEARS HARMONIC ACE PLUS 36CM (ENDOMECHANICALS) IMPLANT
STAPLE ECHEON FLEX 60 POW ENDO (STAPLE) ×5 IMPLANT
STAPLER CIRC CVD 29MM 37CM (STAPLE) ×5 IMPLANT
STAPLER RELOAD GREEN 60MM (STAPLE) ×10
STAPLER VISISTAT 35W (STAPLE) ×5 IMPLANT
SUCTION POOLE HANDLE (INSTRUMENTS) ×5
SUT NOV 1 T60/GS (SUTURE) IMPLANT
SUT NOVA T20/GS 25 (SUTURE) ×10 IMPLANT
SUT PDS AB 1 CTX 36 (SUTURE) ×15 IMPLANT
SUT PDS AB 1 TP1 54 (SUTURE) IMPLANT
SUT PROLENE 2 0 KS (SUTURE) ×5 IMPLANT
SUT SILK 2 0 (SUTURE) ×5
SUT SILK 2 0 SH CR/8 (SUTURE) ×5 IMPLANT
SUT SILK 2 0SH CR/8 30 (SUTURE) IMPLANT
SUT SILK 2-0 18XBRD TIE 12 (SUTURE) ×4 IMPLANT
SUT SILK 2-0 30XBRD TIE 12 (SUTURE) IMPLANT
SUT SILK 3 0 (SUTURE) ×4
SUT SILK 3 0 SH CR/8 (SUTURE) ×5 IMPLANT
SUT SILK 3-0 18XBRD TIE 12 (SUTURE) ×4 IMPLANT
SUT VIC AB 3-0 SH 18 (SUTURE) ×5 IMPLANT
SUT VICRYL 4-0 PS2 18IN ABS (SUTURE) ×10 IMPLANT
TOWEL OR 17X26 10 PK STRL BLUE (TOWEL DISPOSABLE) ×10 IMPLANT
TRAY FOLEY W/METER SILVER 16FR (SET/KITS/TRAYS/PACK) ×5 IMPLANT
TROCAR XCEL 12X100 BLDLESS (ENDOMECHANICALS) ×5 IMPLANT
TUBING CONNECTING 10 (TUBING) ×5 IMPLANT
WATER STERILE IRR 1000ML POUR (IV SOLUTION) IMPLANT
YANKAUER SUCT BULB TIP NO VENT (SUCTIONS) ×5 IMPLANT

## 2017-03-24 NOTE — Progress Notes (Signed)
Peripherally Inserted Central Catheter/Midline Placement  The IV Nurse has discussed with the patient and/or persons authorized to consent for the patient, the purpose of this procedure and the potential benefits and risks involved with this procedure.  The benefits include less needle sticks, lab draws from the catheter, and the patient may be discharged home with the catheter. Risks include, but not limited to, infection, bleeding, blood clot (thrombus formation), and puncture of an artery; nerve damage and irregular heartbeat and possibility to perform a PICC exchange if needed/ordered by physician.  Alternatives to this procedure were also discussed.  Bard Power PICC patient education guide, fact sheet on infection prevention and patient information card has been provided to patient /or left at bedside.    PICC/Midline Placement Documentation  PICC Double Lumen 94/58/59 PICC Right Basilic 37 cm 0 cm (Active)  Indication for Insertion or Continuance of Line Prolonged intravenous therapies 03/24/2017  8:35 AM  Exposed Catheter (cm) 0 cm 03/24/2017  8:35 AM  Site Assessment Clean;Dry;Intact 03/24/2017  8:35 AM  Lumen #1 Status Flushed;Saline locked;Blood return noted 03/24/2017  8:35 AM  Lumen #2 Status Flushed;Saline locked;Blood return noted 03/24/2017  8:35 AM  Dressing Type Transparent;Securing device 03/24/2017  8:35 AM  Dressing Status Clean;Dry;Intact;Antimicrobial disc in place 03/24/2017  8:35 AM  Dressing Change Due 03/31/17 03/24/2017  8:35 AM       [name] 03/24/2017, 9:00 AM

## 2017-03-24 NOTE — Progress Notes (Addendum)
PROGRESS NOTE    [name]  MCN:[id number] DOB: 09/04/1950 DOA: 03/16/2017 PCP: Burnis Medin, MD    Brief Narrative:  67 year old Caucasian female with a past medical history of essential hypertension, hypothyroidism, restless leg syndrome presented with abdominal pain.  Colitis end of December.  She underwent a CT scan in early January which cannot rule out a soft tissue density in that area.  She was treated with a prolonged course of antibiotics with Cipro and Flagyl.  She did feel better however her symptoms recurred last week.  She was placed on Augmentin by her gastroenterologist, Dr. Earlean Shawl.  However subsequently developed a fever and so was asked to come into the hospital.  She was hospitalized and placed on Zosyn.  Plan is for surgery,  2/27.  Assessment & Plan:   Active Problems:   Acute diverticulitis   Acute recurrent sigmoid diverticulosis with diverticulitis Patient failed outpatient treatment with Augmentin this month.  Repeat CT scan showed persistent diverticulitis in the sigmoid area.  She was started on Zosyn and general surgery was consulted.  She is scheduled for surgical repair and sigmoid colectomy today.   Bradycardia EKG shows sinus bradycardia without any T wave changes She is not on any rate limiting agents, unclear etiology. Her thyroid panel appears to be abnormal.  Echocardiogram does not show any significant abnormalities.  Will will request evaluation by endocrinologist at least over the phone to see if her thyroid panel is responsible for her bradycardia.   History of hypothyroidism/thyroidectomy She is status post thyroidectomy in 2006 for goiter currently her TSH appears to be elevated at 16 with free T4 of 1.17 slightly on the higher norm.  Free T3 is slightly low at 1.8.  We will discuss her thyroid panel with the endocrinologist and see if she needs to be on IV Synthroid while n.p.o.   Hypertension well-controlled.  Is less leg syndrome  continue ropinirole.  Hyperlipidemia resume home medications on discharge.\\\  Abnormal UA patient's urine cultures are negative.    DVT prophylaxis: scd's Code Status: full code Family Communication: none at bedside Disposition Plan: pending gen surgery recommendations   Consultants:   Surgery   Urology    Procedures:   ECHOCARDIOGRAM  LAPAROSCOPIC ASSISTED SIGMOID COLECTOMY,  Rigid sigmoidoscopy BILATERAL URETERAL STENT PLACEMENT CYSTOSCOPY/RETROGRADE/URETEROSCOPY     Antimicrobials: cefotan one dose.  Zosyn  Flagyl and neomycin.    Subjective: no new complaints  Objective: Vitals:   03/24/17 1600 03/24/17 1615 03/24/17 1636 03/24/17 1726  BP: (!) 156/74 (!) 145/69 (!) 159/82 (!) 153/80  Pulse: (!) 56 (!) 53 (!) 56 (!) 58  Resp: 12 10 12 14   Temp:  97.6 F (36.4 C) 97.8 F (36.6 C) (!) 97.5 F (36.4 C)  TempSrc:    Axillary  SpO2: 98% 100% 97% 99%  Weight:      Height:        Intake/Output Summary (Last 24 hours) at 03/24/2017 1805 Last data filed at 03/24/2017 1727 Gross per 24 hour  Intake 2260 ml  Output 1625 ml  Net 635 ml   Filed Weights   03/22/17 0500 03/23/17 0500 03/24/17 0500  Weight: 68.3 kg (150 lb 9.2 oz) 68.8 kg (151 lb 10.8 oz) 66.4 kg (146 lb 4.8 oz)    Examination:  General exam: Appears calm and comfortable  Respiratory system: Clear to auscultation. Respiratory effort normal. Cardiovascular system: S1 & S2 heard bradycardia. Gastrointestinal system: Abdomen is mildly tender. Non distended, bowel sounds low.  Central  nervous system: Alert and oriented. No focal neurological deficits. Extremities: Symmetric 5 x 5 power. Skin: No rashes, lesions or ulcers Psychiatry: . Mood & affect appropriate.     Data Reviewed: I have personally reviewed following labs and imaging studies  CBC: Recent Labs  Lab 03/19/17 0443 03/21/17 0522 03/23/17 0444 03/23/17 0840  WBC 5.7 4.8 8.2 8.2  NEUTROABS  --   --   --  5.3  HGB  11.0* 11.4* 11.7* 12.6  HCT 33.0* 33.8* 34.2* 37.1  MCV 97.3 97.1 96.6 95.4  PLT 246 271 337 568   Basic Metabolic Panel: Recent Labs  Lab 03/20/17 0953 03/21/17 0522 03/22/17 0428 03/23/17 0444 03/23/17 0840  NA 138 141 141 142 142  K 3.5 3.9 3.6 3.6 3.6  CL 102 105 105 108 107  CO2 22 25 24 23 24   GLUCOSE 119* 113* 184* 125* 117*  BUN 6 5* 6 13 12   CREATININE 0.78 0.88 0.79 0.88 0.95  CALCIUM 8.8* 8.9 9.3 9.0 9.1  MG 1.7  --   --   --   --    GFR: Estimated Creatinine Clearance: 48.9 mL/min (by C-G formula based on SCr of 0.95 mg/dL). Liver Function Tests: Recent Labs  Lab 03/22/17 0428  AST 24  ALT 33  ALKPHOS 65  BILITOT 1.0  PROT 7.4  ALBUMIN 3.9   No results for input(s): LIPASE, AMYLASE in the last 168 hours. No results for input(s): AMMONIA in the last 168 hours. Coagulation Profile: No results for input(s): INR, PROTIME in the last 168 hours. Cardiac Enzymes: No results for input(s): CKTOTAL, CKMB, CKMBINDEX, TROPONINI in the last 168 hours. BNP (last 3 results) No results for input(s): PROBNP in the last 8760 hours. HbA1C: Recent Labs    03/23/17 0840  HGBA1C 6.1*   CBG: Recent Labs  Lab 03/18/17 1150 03/18/17 1635 03/18/17 2106 03/19/17 0748 03/19/17 1201  GLUCAP 74 123* 71 91 125*   Lipid Profile: No results for input(s): CHOL, HDL, LDLCALC, TRIG, CHOLHDL, LDLDIRECT in the last 72 hours. Thyroid Function Tests: Recent Labs    03/22/17 0428  T3FREE 1.8*   Anemia Panel: No results for input(s): VITAMINB12, FOLATE, FERRITIN, TIBC, IRON, RETICCTPCT in the last 72 hours. Sepsis Labs: No results for input(s): PROCALCITON, LATICACIDVEN in the last 168 hours.  Recent Results (from the past 240 hour(s))  Blood culture (routine x 2)     Status: None   Collection Time: 03/16/17  4:15 PM  Result Value Ref Range Status   Specimen Description   Final    BLOOD RIGHT ANTECUBITAL Performed at [facility] [address] [place], [place] [address]    Special Requests   Final    BOTTLES DRAWN AEROBIC AND ANAEROBIC Blood Culture adequate volume Performed at [facility] [address] [place], [place] [address]    Culture   Final    NO GROWTH 5 DAYS Performed at [facility], [place] [address] [place], [place] [address]    Report Status 03/21/2017 FINAL  Final  Blood culture (routine x 2)     Status: None   Collection Time: 03/16/17  4:20 PM  Result Value Ref Range Status   Specimen Description   Final    BLOOD LEFT ANTECUBITAL Performed at [facility] [address] [place], [place] [address]    Special Requests   Final    BOTTLES DRAWN AEROBIC AND ANAEROBIC Blood Culture adequate volume Performed  at [facility], [place] [address] [place], [place] [address]    Culture   Final    NO GROWTH 5 DAYS Performed at [facility], [place] [address], [place], [place] [address]    Report Status 03/21/2017 FINAL  Final  Culture, Urine     Status: None   Collection Time: 03/17/17  2:50 AM  Result Value Ref Range Status   Specimen Description   Final    URINE, CLEAN CATCH Performed at [facility], [place] [address], [place], [place] [address]    Special Requests   Final    NONE Performed at [facility], [place] [address] [place], [place] [address]    Culture   Final    NO GROWTH Performed at [facility], [place] [address], [place], [place] [address]    Report Status 03/18/2017 FINAL  Final  MRSA PCR Screening     Status: Abnormal   Collection Time: 03/23/17  4:35 PM  Result Value Ref Range Status   MRSA by PCR POSITIVE (A) NEGATIVE Final    Comment:        The GeneXpert MRSA Assay (FDA approved for NASAL specimens only), is one component of a comprehensive MRSA colonization surveillance program. It is not intended to diagnose MRSA infection nor to guide or monitor treatment for MRSA  infections. RESULT CALLED TO, READ BACK BY AND VERIFIED WITH: A PEREZ,RN [id number] @ 2143 BY J SCOTTON Performed at [facility], [place] [address] [place], [place] [address]          Radiology Studies: Dg C-arm 1-60 Min-no Report  Result Date: 03/24/2017 Fluoroscopy was utilized by the requesting physician.  No radiographic interpretation.        Scheduled Meds: . [START ON 03/25/2017] alvimopan  12 mg Oral BID  . Chlorhexidine Gluconate Cloth  6 each Topical Q0600  . fentaNYL      . mupirocin ointment  1 application Nasal BID  . scopolamine  1 patch Transdermal Q72H   Continuous Infusions: . sodium chloride 75 mL/hr at 03/23/17 1959  . acetaminophen    . dextrose 5 % and 0.45 % NaCl with KCl 20 mEq/L 125 mL/hr at 03/24/17 1738  . piperacillin-tazobactam (ZOSYN)  IV       LOS: 8 days    Time spent: 25 min    Hosie Poisson, MD [facility] Pager [phone]  If 7PM-7AM, please contact night-coverage [email or web address] Password [facility] 03/24/2017, 6:05 PM

## 2017-03-24 NOTE — Anesthesia Preprocedure Evaluation (Addendum)
Anesthesia Evaluation  Patient identified by MRN, date of birth, ID band Patient awake    Reviewed: Allergy & Precautions, NPO status , Patient's Chart, lab work & pertinent test results  History of Anesthesia Complications Negative for: history of anesthetic complications  Airway Mallampati: II  TM Distance: >3 FB Neck ROM: Full    Dental no notable dental hx. (+) Dental Advisory Given   Pulmonary asthma ,    Pulmonary exam normal        Cardiovascular hypertension, Pt. on medications negative cardio ROS Normal cardiovascular exam  Study Conclusions  - Left ventricle: The cavity size was normal. Wall thickness was   normal. Systolic function was normal. The estimated ejection   fraction was in the range of 60% to 65%. The study is not   technically sufficient to allow evaluation of LV diastolic   function.   Neuro/Psych  Headaches,    GI/Hepatic Neg liver ROS, GERD  ,  Endo/Other  Hypothyroidism   Renal/GU negative Renal ROS     Musculoskeletal negative musculoskeletal ROS (+)   Abdominal   Peds  Hematology negative hematology ROS (+)   Anesthesia Other Findings Day of surgery medications reviewed with the patient.  Reproductive/Obstetrics                           Anesthesia Physical Anesthesia Plan  ASA: III  Anesthesia Plan: General   Post-op Pain Management:    Induction: Intravenous  PONV Risk Score and Plan: 4 or greater and Ondansetron, Dexamethasone, Scopolamine patch - Pre-op and Diphenhydramine  Airway Management Planned: Oral ETT  Additional Equipment:   Intra-op Plan:   Post-operative Plan: Extubation in OR  Informed Consent: I have reviewed the patients History and Physical, chart, labs and discussed the procedure including the risks, benefits and alternatives for the proposed anesthesia with the patient or authorized representative who has indicated his/her  understanding and acceptance.   Dental advisory given  Plan Discussed with: Anesthesiologist and CRNA  Anesthesia Plan Comments:        Anesthesia Quick Evaluation

## 2017-03-24 NOTE — Anesthesia Procedure Notes (Signed)
Procedure Name: Intubation Date/Time: 03/24/2017 11:11 AM Performed by: [place] Bertice Risse C, CRNA Pre-anesthesia Checklist: Patient identified, Emergency Drugs available, Suction available and Patient being monitored Patient Re-evaluated:Patient Re-evaluated prior to induction Oxygen Delivery Method: Circle system utilized Preoxygenation: Pre-oxygenation with 100% oxygen Induction Type: IV induction Ventilation: Mask ventilation without difficulty Laryngoscope Size: Mac and 3 Grade View: Grade II Tube type: Oral Tube size: 7.5 mm Number of attempts: 1 Airway Equipment and Method: Stylet and Oral airway Placement Confirmation: ETT inserted through vocal cords under direct vision,  positive ETCO2 and breath sounds checked- equal and bilateral Secured at: 20 cm Tube secured with: Tape Dental Injury: Teeth and Oropharynx as per pre-operative assessment

## 2017-03-24 NOTE — Op Note (Addendum)
03/24/2017  2:58 PM  PATIENT:  [name], 67 y.o., female, MRN: [id number]  PREOP DIAGNOSIS:  sigmoid colon diverticulitis   POSTOP DIAGNOSIS:   Acute and chronic sigmoid colon diverticulitis  PROCEDURE:   Procedure(s): LAPAROSCOPIC ASSISTED SIGMOID COLECTOMY, mobilization of the splenic flexure, rigid sigmoidoscopy - Gould, CYSTOSCOPY/RETROGRADE/URETEROSCOPY - Manny  SURGEON:   Alphonsa Overall, M.D.  ASSISTANTShann Medal, MD  ANESTHESIA:   general  Anesthesiologist: Roderic Palau, MD; Duane Boston, MD CRNA: Lollie Sails, CRNA; [place] Stephanie C, CRNA; Raenette Rover, CRNA; Maxwell Caul, CRNA  General  EBL:  150  ml  BLOOD ADMINISTERED: none  DRAINS: none   LOCAL MEDICATIONS USED:   20 cc of Exparel and 30 cc of 1/4% marcaine  SPECIMEN:   Sigmoid colon (suture proximal)  COUNTS CORRECT:  YES  INDICATIONS FOR PROCEDURE:  [name] is a 67 y.o. (DOB: Nov 12, 1950) white female whose primary care physician is Panosh, Standley Brooking, MD and comes for laparoscopic exploration and sigmoid colectomy for indolent sigmoid colon diverticulitis.   The indications and risks of the surgery were explained to the patient.  The risks include, but are not limited to, infection, bleeding, ureteral injury, and nerve injury.  I also talked about a possible colostomy and the patient was marked preoperatively for this possibility.  PROCEDURE:  The patient was taken to OR room #[facility] at [facility].  She was placed in a lithotomy.  Dr. Tresa Moore placed bilateral ureteral stents.  When he was finished, I started the colon surgery.   A timeout was held the surgical and the checklist run.   Her abdomen had been prepped with ChloraPrep.  Her perineum prepped with Betadine.  I accessed her abdominal cavity with a 5 mm Optiview in the left upper quadrant.  I placed 4 additional trochars.  I placed a 5 mm trocar in the right lower quadrant, a 5 mm trocar  just below the umbilicus, a 5 mm trocar in the left lower quadrant, and a 5 mm trocar in the right upper abdomen.   I carried out an abdominal exploration.  Her liver, stomach, and gallbladder all looked normal.  She had a loop of small bowel trapped in the pelvis next to the inflamed sigmoid colon.  I freed this small bowel up.  The remainder of her small bowel was unremarkable.   I then turned my attention to her colon. She had a chronically inflamed distal sigmoid colon stuck in the pelvis.  First, I mobilized her left colon up the lateral left colon gutter to the splenic flexure.  I mobilized the splenic flexure to the left transverse colon.  I used primarily the harmonic scapel for this.  This mobilized the left colon down towards the pelvis.    I then addressed the distal sigmoid colon which was densely scarred in the pelvis.  About 15 cm of sigmoid colon was densely inflamed.  There was no obvious abscess.  The inflamed sigmoid colon was densely stuck to the left pelvic sidewall and the anterior pelvis/bladder anteriorly.  I was able to mobilize the sigmoid colon off the structures.  There was no obvious perforation or fistula.         I then divided the proximal sigmoid colon with an Ethicon Echelon 60 mm green stapler.  I then divided the mesentery of the sigmoid colon down to the pelvis using the harmonic scapel.  The sigmoid colon looked like chronic/acute diverticular disease (not  cancer) and therefore I was less concerned about getting a wide mesenteric margin.  The inflamed sigmoid colon went to about 5 cm above the peritoneal reflection.  I found normal-looking colon about 3 cm above the peritoneal reflection divided this with a Ethicon Echelon 60 mm green stapler.   I had healthy sigmoid colon and a healthy proximal rectal stump, so I thought a primary anastomosis was reasonable.  I made an incision in the left lower abdomen and entered the abdominal cavity.  I placed a wound protector in  this incision.  I delivered the inflamed sigmoid colon through this incision, I placed a suture on the proximal end, and sent the specimen to pathology.  Pathology call back and said the sigmoid colon looks like diverticular disease and saw no evidence of malignancy.   I then used a #29 Ethicon EEA stapler for my anastomosis.  I used a pursestring device to create a pursestring of the proximal sigmoid colon with 0 Prolene suture.  I buttressed the pursestring with some 3-0 nylon sutures.  The #29 EEA anvil fit easily into the colon.  Dr. Brantley Stage went below to the rectum and passed the EEA up the rectum to the proximal rectal stump.  He opened the post through the staple line of the rectal stump and I attached the EEA anvil to the EEA stapler.  He then closed and fired the [employer].  We had complete proximal and distal rings in the EEA stapler.  Since this was not a cancer, I did not send the rings to pathology.   Dr. Brantley Stage then insufflated the rectum/sigmoid colon with a rigid proctoscope.  The staple line was placed under saline and there was no bubbling.  The EEA staple line looked good, we had done an under water test of the anastomosis, and I thought the anastomosis complete.   I then closed the left lower quadrant incision in 2 layers with running #1 PDS suture.  I re-laparoscope the patient and placed an abdominal wall block using 20 cc of Exparel mixed with 30 cc of quarter percent Marcaine.  I irrigated out the left lower quadrant incision and closed in layers with 3-0 Vicryl sutures.  Each trocar was removed and the trocar sites and left lower quadrant incision was closed with a running 4-0 Monocryl suture.   The ureteral stents were removed at the end of the case.  The Foley was left in place.  Sponge and needle count were correct at the end of the case.  She was transferred to the recovery room in good condition.  Alphonsa Overall, MD, [facility] Pager: [phone] Office  phone:  [phone]

## 2017-03-24 NOTE — Anesthesia Postprocedure Evaluation (Signed)
Anesthesia Post Note  Patient: [name]  Procedure(s) Performed: LAPAROSCOPIC ASSISTED LEFT  SIGMOID COLECTOMY (Left ) SIGMOIDOSCOPY BILATERAL URETERAL STENT PLACEMENT (Bilateral ) CYSTOSCOPY/RETROGRADE/URETEROSCOPY     Patient location during evaluation: PACU Anesthesia Type: General Level of consciousness: awake Pain management: pain level controlled Vital Signs Assessment: post-procedure vital signs reviewed and stable Respiratory status: spontaneous breathing Cardiovascular status: stable Postop Assessment: no apparent nausea or vomiting Anesthetic complications: no    Last Vitals:  Vitals:   03/24/17 1600 03/24/17 1615  BP: (!) 156/74 (!) 145/69  Pulse: (!) 56 (!) 53  Resp: 12 10  Temp:  36.4 C  SpO2: 98% 100%    Last Pain:  Vitals:   03/24/17 1615  TempSrc:   PainSc: Asleep   Pain Goal: Patients Stated Pain Goal: 2 (03/24/17 1007)               Lacy Taglieri JR,JOHN Mateo Flow

## 2017-03-24 NOTE — Brief Op Note (Signed)
03/16/2017 - 03/24/2017  1:55 PM  PATIENT:  [name]  67 y.o. female  PRE-OPERATIVE DIAGNOSIS:  sigmoid diverticulitis and descending colon diverticulosis  POST-OPERATIVE DIAGNOSIS:  sigmoid diverticulitis and descending colon diverticulosis  PROCEDURE:  Cystoscopy with bilateral retrogards and bilateral ureteral stent placement  SURGEON:  Surgeon(s) and Role: Panel 1:   Alexis Frock, MD - Primary  PHYSICIAN ASSISTANT:   ASSISTANTS: none   ANESTHESIA:   general  EBL:  minimal  BLOOD ADMINISTERED:none  DRAINS: Foley to gravity. Rt ureteral stent (green). Lt ureteral stent (yellow)   LOCAL MEDICATIONS USED:  NONE  SPECIMEN:  No Specimen  DISPOSITION OF SPECIMEN:  N/A  COUNTS:  YES  TOURNIQUET:  * No tourniquets in log *  DICTATION: .Other Dictation: Dictation Number [id number]  PLAN OF CARE: remain in OR for general surgery portions  PATIENT DISPOSITION:  OR   Delay start of Pharmacological VTE agent (>24hrs) due to surgical blood loss or risk of bleeding: not applicable

## 2017-03-24 NOTE — Transfer of Care (Signed)
Immediate Anesthesia Transfer of Care Note  Patient: [place] Chenoweth  Procedure(s) Performed: LAPAROSCOPIC ASSISTED LEFT  SIGMOID COLECTOMY (Left ) SIGMOIDOSCOPY BILATERAL URETERAL STENT PLACEMENT (Bilateral ) CYSTOSCOPY/RETROGRADE/URETEROSCOPY  Patient Location: PACU  Anesthesia Type:General  Level of Consciousness: awake, alert  and oriented  Airway & Oxygen Therapy: Patient Spontanous Breathing and Patient connected to face mask oxygen  Post-op Assessment: Report given to RN and Post -op Vital signs reviewed and stable  Post vital signs: Reviewed and stable  Last Vitals:  Vitals:   03/24/17 0542 03/24/17 1510  BP: (!) 145/72 (!) 152/72  Pulse: (!) 52 74  Resp: 17 18  Temp: 36.7 C 36.6 C  SpO2: 97% 100%    Last Pain:  Vitals:   03/24/17 1510  TempSrc:   PainSc: Asleep      Patients Stated Pain Goal: 2 (40/35/24 8185)  Complications: No apparent anesthesia complications

## 2017-03-24 NOTE — Progress Notes (Signed)
Subjective/Chief Complaint:   1 - Severe Diverticulitis, Request for Peri-operative Ureteral Stenting - pt with severe diverticulitis of sgmoid cy CT 02/2017, undergoing segmental colectomy with likely end colostomy by general surgery team 2/27 who has requested peri-op ureteral stents. Her imaging does show impressive inflammation to deep pelvsi, no hydro. Cr 0.8.  PMH sig for obesity, benign hyst. No ischmic CV disease / blood thinners.   Today "[name]" is seen to proceed with bilateral stenting as part of colon resection for severe diverticulitis.     Objective: Vital signs in last 24 hours: Temp:  [98 F (36.7 C)-98.1 F (36.7 C)] 98.1 F (36.7 C) (02/27 0542) Pulse Rate:  [52-58] 52 (02/27 0542) Resp:  [17-18] 17 (02/27 0542) BP: (145-159)/(72-93) 145/72 (02/27 0542) SpO2:  [97 %-99 %] 97 % (02/27 0542) Weight:  [66.4 kg (146 lb 4.8 oz)] 66.4 kg (146 lb 4.8 oz) (02/27 0500) Last BM Date: 03/23/17  Intake/Output from previous day: 02/26 0701 - 02/27 0700 In: 2260 [P.O.:360; I.V.:1800; IV Piggyback:100] Out: 400 [Urine:400] Intake/Output this shift: Total I/O In: 1310 [P.O.:360; I.V.:900; IV Piggyback:50] Out: 400 [Urine:400]   Physical Exam  Constitutional: She appears well-developed.  HENT:  Head: Normocephalic.  Eyes: Pupils are equal, round, and reactive to light.  Neck: Normal range of motion.  Cardiovascular: Normal rate.  Respiratory: Effort normal.  GI:  Moderate truncal obesity.   Genitourinary:  Genitourinary Comments: No CVAT  Musculoskeletal: Normal range of motion.  Neurological: She is alert.  Skin: Skin is warm.  Psychiatric: She has a normal mood and affect.      Lab Results:  Recent Labs    03/23/17 0444 03/23/17 0840  WBC 8.2 8.2  HGB 11.7* 12.6  HCT 34.2* 37.1  PLT 337 340   BMET Recent Labs    03/23/17 0444 03/23/17 0840  NA 142 142  K 3.6 3.6  CL 108 107  CO2 23 24  GLUCOSE 125* 117*  BUN 13 12  CREATININE  0.88 0.95  CALCIUM 9.0 9.1   PT/INR No results for input(s): LABPROT, INR in the last 72 hours. ABG No results for input(s): PHART, HCO3 in the last 72 hours.  Invalid input(s): PCO2, PO2  Studies/Results: No results found.  Anti-infectives: Anti-infectives (From admission, onward)   Start     Dose/Rate Route Frequency Ordered Stop   03/24/17 0600  cefoTEtan (CEFOTAN) 2 g in sodium chloride 0.9 % 100 mL IVPB     2 g 200 mL/hr over 30 Minutes Intravenous On call to O.R. 03/23/17 0844 03/25/17 0559   03/23/17 1400  neomycin (MYCIFRADIN) tablet 1,000 mg     1,000 mg Oral 3 times per day 03/23/17 0826 03/23/17 2344   03/23/17 1400  metroNIDAZOLE (FLAGYL) tablet 1,000 mg     1,000 mg Oral 3 times per day 03/23/17 0826 03/23/17 2343   03/23/17 0900  cefoTEtan (CEFOTAN) 2 g in sodium chloride 0.9 % 100 mL IVPB  Status:  Discontinued     2 g 200 mL/hr over 30 Minutes Intravenous On call to O.R. 03/23/17 6606 03/23/17 0844   03/17/17 0000  piperacillin-tazobactam (ZOSYN) IVPB 3.375 g     3.375 g 12.5 mL/hr over 240 Minutes Intravenous Every 8 hours 03/16/17 1626     03/16/17 1630  piperacillin-tazobactam (ZOSYN) IVPB 3.375 g     3.375 g 100 mL/hr over 30 Minutes Intravenous  Once 03/16/17 1626 03/16/17 1737      Assessment/Plan:   1 - Severe Diverticulitis,  Request for Peri-operative Ureteral Stenting - proceed as planned with cysto, bilateral retrogrades, bilateral ureteral stent placement. Risks, benefits, alternatives, expected peri-op course discussed previously and reiterated today.   [facility], Mohsin Crum 03/24/2017

## 2017-03-25 ENCOUNTER — Encounter (HOSPITAL_COMMUNITY): Payer: Self-pay | Admitting: Surgery

## 2017-03-25 LAB — CBC WITH DIFFERENTIAL/PLATELET
BASOS ABS: 0 10*3/uL (ref 0.0–0.1)
BASOS PCT: 0 %
Eosinophils Absolute: 0 10*3/uL (ref 0.0–0.7)
Eosinophils Relative: 0 %
HEMATOCRIT: 36.3 % (ref 36.0–46.0)
HEMOGLOBIN: 12.4 g/dL (ref 12.0–15.0)
LYMPHS PCT: 11 %
Lymphs Abs: 1.3 10*3/uL (ref 0.7–4.0)
MCH: 32.9 pg (ref 26.0–34.0)
MCHC: 34.2 g/dL (ref 30.0–36.0)
MCV: 96.3 fL (ref 78.0–100.0)
MONOS PCT: 4 %
Monocytes Absolute: 0.5 10*3/uL (ref 0.1–1.0)
NEUTROS ABS: 10 10*3/uL — AB (ref 1.7–7.7)
NEUTROS PCT: 85 %
Platelets: 342 10*3/uL (ref 150–400)
RBC: 3.77 MIL/uL — ABNORMAL LOW (ref 3.87–5.11)
RDW: 14.2 % (ref 11.5–15.5)
WBC: 11.8 10*3/uL — ABNORMAL HIGH (ref 4.0–10.5)

## 2017-03-25 LAB — BASIC METABOLIC PANEL
ANION GAP: 10 (ref 5–15)
BUN: 7 mg/dL (ref 6–20)
CHLORIDE: 104 mmol/L (ref 101–111)
CO2: 26 mmol/L (ref 22–32)
Calcium: 9.1 mg/dL (ref 8.9–10.3)
Creatinine, Ser: 0.87 mg/dL (ref 0.44–1.00)
GFR calc non Af Amer: >60 mL/min (ref >60)
Glucose, Bld: 173 mg/dL — ABNORMAL HIGH (ref 65–99)
Potassium: 4 mmol/L (ref 3.5–5.1)
Sodium: 140 mmol/L (ref 135–145)

## 2017-03-25 MED ORDER — LEVOTHYROXINE SODIUM 112 MCG PO TABS
112.0000 ug | ORAL_TABLET | Freq: Every day | ORAL | Status: DC
Start: 2017-03-25 — End: 2017-03-31
  Administered 2017-03-25 – 2017-03-31 (×7): 112 ug via ORAL
  Filled 2017-03-25 (×7): qty 1

## 2017-03-25 MED ORDER — PANTOPRAZOLE SODIUM 40 MG IV SOLR
40.0000 mg | INTRAVENOUS | Status: DC
  Administered 2017-03-25 – 2017-03-27 (×3): 40 mg via INTRAVENOUS
  Filled 2017-03-25 (×3): qty 40

## 2017-03-25 MED ORDER — LORAZEPAM 2 MG/ML IJ SOLN: 0.5000 mg | Freq: Four times a day (QID) | INTRAMUSCULAR | Status: DC | PRN

## 2017-03-25 MED ORDER — ROPINIROLE HCL 1 MG PO TABS
0.5000 mg | ORAL_TABLET | Freq: Three times a day (TID) | ORAL | Status: DC
  Administered 2017-03-25 – 2017-03-31 (×19): 0.5 mg via ORAL
  Filled 2017-03-25 (×19): qty 1

## 2017-03-25 NOTE — Progress Notes (Addendum)
[facility] Progress Note  1 Day Post-Op  Subjective: CC: abdominal soreness Patient states abdomen is sore but overall pain well controlled. Denies nausea. May have passed some flatus. Had some bloody discharge per rectum. Has already walked in the hall some and is pulling 2000 on IS. Restless leg is bothering her.  UOP good. VSS.   Objective: Vital signs in last 24 hours: Temp:  [97.5 F (36.4 C)-98.1 F (36.7 C)] 97.8 F (36.6 C) (02/28 0538) Pulse Rate:  [53-74] 58 (02/28 0538) Resp:  [10-18] 17 (02/28 0538) BP: (126-164)/(66-82) 143/73 (02/28 0538) SpO2:  [95 %-100 %] 96 % (02/28 0538) Weight:  [66 kg (145 lb 8 oz)] 66 kg (145 lb 8 oz) (02/28 0500) Last BM Date: 03/23/17  Intake/Output from previous day: 02/27 0701 - 02/28 0700 In: 2155.8 [I.V.:1855.8; IV Piggyback:300] Out: 2050 [Urine:2000; Blood:50] Intake/Output this shift: No intake/output data recorded.  PE: Gen:  Alert, NAD, pleasant Card:  Regular rate and rhythm, pedal pulses 2+ BL Pulm:  Normal effort, clear to auscultation bilaterally Abd: Soft, appropriately tender, non-distended, bowel sounds present, no HSM, incisions C/D/I with honeycomb dressing over LLQ incision  Skin: warm and dry, no rashes  Psych: A&Ox3   Lab Results:  Recent Labs    03/23/17 0840 03/25/17 0456  WBC 8.2 11.8*  HGB 12.6 12.4  HCT 37.1 36.3  PLT 340 342   BMET Recent Labs    03/23/17 0840 03/25/17 0456  NA 142 140  K 3.6 4.0  CL 107 104  CO2 24 26  GLUCOSE 117* 173*  BUN 12 7  CREATININE 0.95 0.87  CALCIUM 9.1 9.1   PT/INR No results for input(s): LABPROT, INR in the last 72 hours. CMP     Component Value Date/Time   NA 140 03/25/2017 0456   K 4.0 03/25/2017 0456   CL 104 03/25/2017 0456   CO2 26 03/25/2017 0456   GLUCOSE 173 (H) 03/25/2017 0456   BUN 7 03/25/2017 0456   CREATININE 0.87 03/25/2017 0456   CALCIUM 9.1 03/25/2017 0456   CALCIUM 8.8 11/17/2006 2239   PROT 7.4 03/22/2017 0428    ALBUMIN 3.9 03/22/2017 0428   AST 24 03/22/2017 0428   ALT 33 03/22/2017 0428   ALKPHOS 65 03/22/2017 0428   BILITOT 1.0 03/22/2017 0428   GFRNONAA >60 03/25/2017 0456   GFRAA >60 03/25/2017 0456   Lipase     Component Value Date/Time   LIPASE 24 03/16/2017 1213       Studies/Results: Dg C-arm 1-60 Min-no Report  Result Date: 03/24/2017 Fluoroscopy was utilized by the requesting physician.  No radiographic interpretation.    Anti-infectives: Anti-infectives (From admission, onward)   Start     Dose/Rate Route Frequency Ordered Stop   03/24/17 2000  piperacillin-tazobactam (ZOSYN) IVPB 3.375 g     3.375 g 12.5 mL/hr over 240 Minutes Intravenous Every 8 hours 03/24/17 1657 03/26/17 1959   03/24/17 0830  cefoTEtan in Dextrose 5% (CEFOTAN) IVPB 2 g  Status:  Discontinued     2 g Intravenous  Once 03/24/17 0734 03/24/17 1631   03/24/17 0600  cefoTEtan (CEFOTAN) 2 g in sodium chloride 0.9 % 100 mL IVPB     2 g 200 mL/hr over 30 Minutes Intravenous On call to O.R. 03/23/17 0844 03/24/17 1128   03/23/17 1400  neomycin (MYCIFRADIN) tablet 1,000 mg     1,000 mg Oral 3 times per day 03/23/17 0826 03/23/17 2344   03/23/17 1400  metroNIDAZOLE (FLAGYL)  tablet 1,000 mg     1,000 mg Oral 3 times per day 03/23/17 0826 03/23/17 2343   03/23/17 0900  cefoTEtan (CEFOTAN) 2 g in sodium chloride 0.9 % 100 mL IVPB  Status:  Discontinued     2 g 200 mL/hr over 30 Minutes Intravenous On call to O.R. 03/23/17 6734 03/23/17 0844   03/17/17 0000  piperacillin-tazobactam (ZOSYN) IVPB 3.375 g  Status:  Discontinued     3.375 g 12.5 mL/hr over 240 Minutes Intravenous Every 8 hours 03/16/17 1626 03/24/17 1708   03/16/17 1630  piperacillin-tazobactam (ZOSYN) IVPB 3.375 g     3.375 g 100 mL/hr over 30 Minutes Intravenous  Once 03/16/17 1626 03/16/17 1737       Assessment/Plan Hypothyroidism - may resume PO thyroid replacement when tolerating CLD Hx of R knee surgery at [facility]  12/2016  Diverticulitis S/P lap assisted sigmoid colectomy 03/24/17 Dr. Lucia Gaskins  - POD#1 - Patient with great bowel sounds and may have passed some flatus - likely start on CLD tomorrow, will keep on chips and sips today - continue to mobilize - d/c foley today - patient may shower  FEN: NPO except ice chips and sips with meds, IVF VTE: SCDs, lovenox ID: IV zosyn 2/19>>    LOS: 9 days    Brigid Re , [facility] 03/25/2017, 8:58 AM Pager: [phone] Consults: [phone]  Agree with above.  Alphonsa Overall, MD, [facility] Pager: [phone] Office phone:  [phone]

## 2017-03-25 NOTE — Discharge Instructions (Signed)
[facility]      [facility] [phone]  ABDOMINAL SURGERY: POST OP INSTRUCTIONS  Always review your discharge instruction sheet given to you by the facility where your surgery was performed.  IF YOU HAVE DISABILITY OR FAMILY LEAVE FORMS, YOU MUST BRING THEM TO THE OFFICE FOR PROCESSING.  PLEASE DO NOT GIVE THEM TO YOUR DOCTOR.  1. A prescription for pain medication may be given to you upon discharge.  Take your pain medication as prescribed, if needed.  If narcotic pain medicine is not needed, then you may take acetaminophen (Tylenol) or ibuprofen (Advil) as needed. 2. Take your usually prescribed medications unless otherwise directed. 3. If you need a refill on your pain medication, please contact your pharmacy. They will contact our office to request authorization.  Prescriptions will not be filled after 5pm or on week-ends. 4. You should follow a light diet the first few days after arrival home, such as soup and crackers, pudding, etc.unless your doctor has advised otherwise. A high-fiber, low fat diet can be resumed as tolerated.   Be sure to include lots of fluids daily. Most patients will experience some swelling and bruising on the chest and neck area.  Ice packs will help.  Swelling and bruising can take several days to resolve 5. Most patients will experience some swelling and bruising in the area of the incision. Ice pack will help. Swelling and bruising can take several days to resolve..  6. It is common to experience some constipation if taking pain medication after surgery.  Increasing fluid intake and taking a stool softener will usually help or prevent this problem from occurring.  A mild laxative (Milk of Magnesia or Miralax) should be taken according to package directions if there are no bowel movements after 48 hours. 7.  You may have steri-strips (small skin tapes) in place directly over the incision.  These strips should be left on the skin for 7-10 days.  If your  surgeon used skin glue on the incision, you may shower in 24 hours.  The glue will flake off over the next 2-3 weeks.  Any sutures or staples will be removed at the office during your follow-up visit. You may find that a light gauze bandage over your incision may keep your staples from being rubbed or pulled. You may shower and replace the bandage daily. 8. ACTIVITIES:  You may resume regular (light) daily activities beginning the next day--such as daily self-care, walking, climbing stairs--gradually increasing activities as tolerated.  You may have sexual intercourse when it is comfortable.  Refrain from any heavy lifting or straining until approved by your doctor. a. You may drive when you no longer are taking prescription pain medication, you can comfortably wear a seatbelt, and you can safely maneuver your car and apply brakes b. Return to Work: ___________________________________ 9. You should see your doctor in the office for a follow-up appointment approximately two weeks after your surgery.  Make sure that you call for this appointment within a day or two after you arrive home to insure a convenient appointment time. OTHER INSTRUCTIONS:  _____________________________________________________________ _____________________________________________________________  WHEN TO CALL YOUR DOCTOR: 1. Fever over 101.0 2. Inability to urinate 3. Nausea and/or vomiting 4. Extreme swelling or bruising 5. Continued bleeding from incision. 6. Increased pain, redness, or drainage from the incision. 7. Difficulty swallowing or breathing 8. Muscle cramping or spasms. 9. Numbness or tingling in hands or feet or around lips.  The clinic staff is available to answer   your questions during regular business hours.  Please don't hesitate to call and ask to speak to one of the nurses if you have concerns.  For further questions, please visit [email or web address]   

## 2017-03-25 NOTE — Op Note (Signed)
NAME:  [name], [name]                 ACCOUNT NO.:  MEDICAL RECORD NO.:  [id number]  LOCATION:                                 FACILITY:  PHYSICIAN:  Alexis Frock, MD     DATE OF BIRTH:  1950-02-05  DATE OF PROCEDURE: 03/24/2017                              OPERATIVE REPORT   DIAGNOSIS:  Sigmoid diverticulitis.  PROCEDURES: 1. Cystoscopy with bilateral retrograde pyelograms and interpretation. 2. Insertion of bilateral ureteral stents, externalized, right is     green, left is yellow.  ESTIMATED BLOOD LOSS:  Nil.  COMPLICATION:  None.  SPECIMEN:  None.  FINDINGS: 1. Unremarkable bilateral retrograde pyelograms. 2. Successful placement of bilateral ureteral stents, externalized.  INDICATION:  [name] is a pleasant 67 year old lady with recent history of severe sigmoid diverticulitis.  She is undergoing segmental colon resection today under the care of the General Surgery team.  Given the impressive inflammatory response of the pelvis, they have requested bilateral ureteral stenting for intraoperative identification.  I reviewed the patient's imaging and felt this is also indicated and happy to perform.  Informed consent was obtained and placed in the medical record.  PROCEDURE IN DETAIL:  The patient being [name], was verified. Procedure being cystoscopy with bilateral ureteral stent placement was confirmed.  Procedure was carried out.  Time-out was performed. Intravenous antibiotics were administered.  General endotracheal anesthesia introduced.  The patient was placed into a low lithotomy position.  Sterile field was created by prepping and draping the patient's vagina, introitus and proximal thighs using iodine. Cystourethroscopy was performed using a 22-French rigid cystoscope with offset lens.  Inspection of the urinary bladder revealed no diverticula, calcifications, papillary lesions.  The right ureteral orifice was cannulated with a 6-French  end-hole catheter, green in color, and right retrograde pyelogram was obtained.  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  There was no obvious medial deviation.  The proximal end of the open-ended catheter was advanced to the level of the upper pole and this was set aside.  Similarly, left retrograde pyelogram was obtained using a yellow-colored 5-French open-ended catheter and left retrograde pyelogram was obtained.  Left retrograde pyelogram demonstrated a single left ureter with single- system left kidney.  There was no obvious medial deviation, extravasation.  It was advanced to the level of the upper pole and set aside.  Next, a 14-French Foley catheter was placed per urethra to straight drain and a separate silk suture was used to tie each of these externalized catheters to the Foley catheter to prevent inadvertent dislodgement.  All three means of urinary drainage were connected to a Goldberg reducer and urologic portion of the procedure was terminated.  The patient tolerated the procedure well. There were no immediate periprocedural complications.  She was then turned over to the General Surgery Team for her segmental colon resection today.          ______________________________ Alexis Frock, MD     TM/MEDQ  D:  03/24/2017  T:  03/25/2017  Job:  [id number]

## 2017-03-25 NOTE — Progress Notes (Signed)
PROGRESS NOTE    [name]  GXQ:[id number] DOB: [birth year] DOA: 03/16/2017 PCP: Burnis Medin, MD    Brief Narrative:  67 year old Caucasian female with a past medical history of essential hypertension, hypothyroidism, restless leg syndrome presented with abdominal pain.  Colitis end of December.  She underwent a CT scan in early January which cannot rule out a soft tissue density in that area.  She was treated with a prolonged course of antibiotics with Cipro and Flagyl.  She did feel better however her symptoms recurred last week.  She was placed on Augmentin by her gastroenterologist, Dr. Earlean Shawl.  However subsequently developed a fever and so was asked to come into the hospital.  She was hospitalized and placed on Zosyn.  Plan is for surgery,  2/27.  Patient underwent lap-assisted sigmoid colectomy by Dr. Lucia Gaskins and cystoscopy with bilateral retrograde pyelogram and insertion of bilateral ureteral stents and Dr. Leafy Ro on 2/27.  Assessment & Plan:   Active Problems:   Hypothyroidism   Pure hypercholesterolemia   GERD   Essential hypertension   Acute diverticulitis   Acute recurrent sigmoid diverticulosis with diverticulitis Patient failed outpatient treatment with Augmentin this month.  Repeat CT scan showed persistent diverticulitis in the sigmoid area.  She was started on Zosyn and general surgery was consulted. She underwent  lap-assisted sigmoid colectomy by Dr. Lucia Gaskins and cystoscopy with bilateral retrograde pyelogram and insertion of bilateral ureteral stents and Dr. Leafy Ro on 2/27. Today she has a small bowel movement is able to pass lattice.  Her nausea is better her abdominal pain has improved she is able to mobilize and walk in the hallway.  Surgery on board and following appreciate the recommendations,.  She continues to improve we will probably start her on clear liquid diet tomorrow.  For today she will take oral meds with sips of water.   Bradycardia EKG shows sinus  bradycardia without any T wave changes She is not on any rate limiting agents, unclear etiology. Her thyroid panel appears to be abnormal.  Echocardiogram does not show any significant abnormalities.  Post surgery her heart rate has improved and it staying in 6 days.   History of hypothyroidism/thyroidectomy She is status post thyroidectomy in 2006 for goiter currently her TSH appears to be elevated at 16 with free T4 of 1.17 slightly on the higher norm.  Free T3 is slightly low at 1.8.  Resume her Synthroid.  Her heart rates better.   Hypertension well-controlled.  Resume home medications.  Restless leg syndrome continue ropinirole.  Hyperlipidemia resume home medications on discharge.\\\  Abnormal UA patient's urine cultures are negative.  Mild leukocytosis probably reactive post surgery continue to monitor.    DVT prophylaxis: scd's Code Status: full code Family Communication: none at bedside Disposition Plan: Possibly home in 2-3 days.   Consultants:   Surgery   Urology    Procedures:   ECHOCARDIOGRAM  LAPAROSCOPIC ASSISTED SIGMOID COLECTOMY,  Rigid sigmoidoscopy BILATERAL URETERAL STENT PLACEMENT CYSTOSCOPY/RETROGRADE/URETEROSCOPY     Antimicrobials: cefotan one dose.  Zosyn  Flagyl and neomycin.    Subjective: Reports worsening of restless leg symptoms  Objective: Vitals:   03/25/17 0032 03/25/17 0500 03/25/17 0538 03/25/17 0955  BP: 126/66  (!) 143/73 131/66  Pulse: (!) 54  (!) 58 62  Resp: 16  17 18   Temp: 98.1 F (36.7 C)  97.8 F (36.6 C) 98 F (36.7 C)  TempSrc: Oral  Oral Oral  SpO2: 95%  96% 96%  Weight:  66 kg (  145 lb 8 oz)    Height:        Intake/Output Summary (Last 24 hours) at 03/25/2017 1418 Last data filed at 03/25/2017 1300 Gross per 24 hour  Intake 4430.83 ml  Output 1775 ml  Net 2655.83 ml   Filed Weights   03/23/17 0500 03/24/17 0500 03/25/17 0500  Weight: 68.8 kg (151 lb 10.8 oz) 66.4 kg (146 lb 4.8 oz) 66 kg (145  lb 8 oz)    Examination:  General exam: Appears calm and comfortable not in any kind of distress Respiratory system: Clear to auscultation. Respiratory effort normal.  No wheezing or rhonchi Cardiovascular system: S1 & S2 heard regular rate and rhythm Gastrointestinal system: Abdomen is mildly tender. Non distended, bowel sounds good Central nervous system: Alert and oriented. No focal neurological deficits. Extremities: Symmetric 5 x 5 power.  No pedal edema cyanosis or clubbing Skin: No rashes, lesions or ulcers Psychiatry: . Mood & affect appropriate.     Data Reviewed: I have personally reviewed following labs and imaging studies  CBC: Recent Labs  Lab 03/19/17 0443 03/21/17 0522 03/23/17 0444 03/23/17 0840 03/25/17 0456  WBC 5.7 4.8 8.2 8.2 11.8*  NEUTROABS  --   --   --  5.3 10.0*  HGB 11.0* 11.4* 11.7* 12.6 12.4  HCT 33.0* 33.8* 34.2* 37.1 36.3  MCV 97.3 97.1 96.6 95.4 96.3  PLT 246 271 337 340 761   Basic Metabolic Panel: Recent Labs  Lab 03/20/17 0953 03/21/17 0522 03/22/17 0428 03/23/17 0444 03/23/17 0840 03/25/17 0456  NA 138 141 141 142 142 140  K 3.5 3.9 3.6 3.6 3.6 4.0  CL 102 105 105 108 107 104  CO2 22 25 24 23 24 26   GLUCOSE 119* 113* 184* 125* 117* 173*  BUN 6 5* 6 13 12 7   CREATININE 0.78 0.88 0.79 0.88 0.95 0.87  CALCIUM 8.8* 8.9 9.3 9.0 9.1 9.1  MG 1.7  --   --   --   --   --    GFR: Estimated Creatinine Clearance: 53.2 mL/min (by C-G formula based on SCr of 0.87 mg/dL). Liver Function Tests: Recent Labs  Lab 03/22/17 0428  AST 24  ALT 33  ALKPHOS 65  BILITOT 1.0  PROT 7.4  ALBUMIN 3.9   No results for input(s): LIPASE, AMYLASE in the last 168 hours. No results for input(s): AMMONIA in the last 168 hours. Coagulation Profile: No results for input(s): INR, PROTIME in the last 168 hours. Cardiac Enzymes: No results for input(s): CKTOTAL, CKMB, CKMBINDEX, TROPONINI in the last 168 hours. BNP (last 3 results) No results for  input(s): PROBNP in the last 8760 hours. HbA1C: Recent Labs    03/23/17 0840  HGBA1C 6.1*   CBG: Recent Labs  Lab 03/18/17 1635 03/18/17 2106 03/19/17 0748 03/19/17 1201  GLUCAP 123* 71 91 125*   Lipid Profile: No results for input(s): CHOL, HDL, LDLCALC, TRIG, CHOLHDL, LDLDIRECT in the last 72 hours. Thyroid Function Tests: No results for input(s): TSH, T4TOTAL, FREET4, T3FREE, THYROIDAB in the last 72 hours. Anemia Panel: No results for input(s): VITAMINB12, FOLATE, FERRITIN, TIBC, IRON, RETICCTPCT in the last 72 hours. Sepsis Labs: No results for input(s): PROCALCITON, LATICACIDVEN in the last 168 hours.  Recent Results (from the past 240 hour(s))  Blood culture (routine x 2)     Status: None   Collection Time: 03/16/17  4:15 PM  Result Value Ref Range Status   Specimen Description   Final    BLOOD RIGHT  ANTECUBITAL Performed at [facility], [place] [address], [place], [place] [address]    Special Requests   Final    BOTTLES DRAWN AEROBIC AND ANAEROBIC Blood Culture adequate volume Performed at [place] [address] [place] [address] [place], [place] [address]    Culture   Final    NO GROWTH 5 DAYS Performed at [facility], [place] [address], [place], [place] [address]    Report Status 03/21/2017 FINAL  Final  Blood culture (routine x 2)     Status: None   Collection Time: 03/16/17  4:20 PM  Result Value Ref Range Status   Specimen Description   Final    BLOOD LEFT ANTECUBITAL Performed at [place] [address] [place], [place] [address]    Special Requests   Final    BOTTLES DRAWN AEROBIC AND ANAEROBIC Blood Culture adequate volume Performed at [place] [address], [place], [place] [address]    Culture   Final    NO GROWTH 5 DAYS Performed at [facility], [place] [address] [place], [place] [address]    Report Status 03/21/2017 FINAL  Final  Culture, Urine     Status:  None   Collection Time: 03/17/17  2:50 AM  Result Value Ref Range Status   Specimen Description   Final    URINE, CLEAN CATCH Performed at [facility], [place] [address] [place], [place] [address]    Special Requests   Final    NONE Performed at [facility], [place] [address], [place], [place] [address]    Culture   Final    NO GROWTH Performed at [facility], [place] [address] [place], [place] [address]    Report Status 03/18/2017 FINAL  Final  MRSA PCR Screening     Status: Abnormal   Collection Time: 03/23/17  4:35 PM  Result Value Ref Range Status   MRSA by PCR POSITIVE (A) NEGATIVE Final    Comment:        The GeneXpert MRSA Assay (FDA approved for NASAL specimens only), is one component of a comprehensive MRSA colonization surveillance program. It is not intended to diagnose MRSA infection nor to guide or monitor treatment for MRSA infections. RESULT CALLED TO, READ BACK BY AND VERIFIED WITH: A PEREZ,RN [id number] @ 2143 BY J SCOTTON Performed at [facility], [place] [address], [place], [place] [address]          Radiology Studies: Dg C-arm 1-60 Min-no Report  Result Date: 03/24/2017 Fluoroscopy was utilized by the requesting physician.  No radiographic interpretation.        Scheduled Meds: . Chlorhexidine Gluconate Cloth  6 each Topical Q0600  . levothyroxine  112 mcg Oral QAC breakfast  . mupirocin ointment  1 application Nasal BID  . pantoprazole (PROTONIX) IV  40 mg Intravenous Q24H  . rOPINIRole  0.5 mg Oral TID  . scopolamine  1 patch Transdermal Q72H   Continuous Infusions: . acetaminophen Stopped (03/25/17 1243)  . dextrose 5 % and 0.45 % NaCl with KCl 20 mEq/L 125 mL/hr at 03/25/17 1349  . piperacillin-tazobactam (ZOSYN)  IV 3.375 g (03/25/17 1228)     LOS: 9 days    Time spent: 59 min    Hosie Poisson, MD [facility] Pager [phone]  If 7PM-7AM, please contact  night-coverage [email or web address] Password [facility] 03/25/2017, 2:18 PM

## 2017-03-26 LAB — CBC WITH DIFFERENTIAL/PLATELET
BASOS ABS: 0 10*3/uL (ref 0.0–0.1)
Basophils Relative: 0 %
EOS PCT: 1 %
Eosinophils Absolute: 0.1 10*3/uL (ref 0.0–0.7)
HCT: 34.3 % — ABNORMAL LOW (ref 36.0–46.0)
Hemoglobin: 11.2 g/dL — ABNORMAL LOW (ref 12.0–15.0)
Lymphocytes Relative: 24 %
Lymphs Abs: 2.1 10*3/uL (ref 0.7–4.0)
MCH: 32.3 pg (ref 26.0–34.0)
MCHC: 32.7 g/dL (ref 30.0–36.0)
MCV: 98.8 fL (ref 78.0–100.0)
MONO ABS: 0.5 10*3/uL (ref 0.1–1.0)
Monocytes Relative: 6 %
NEUTROS ABS: 5.9 10*3/uL (ref 1.7–7.7)
Neutrophils Relative %: 69 %
PLATELETS: 315 10*3/uL (ref 150–400)
RBC: 3.47 MIL/uL — ABNORMAL LOW (ref 3.87–5.11)
RDW: 14.6 % (ref 11.5–15.5)
WBC: 8.6 10*3/uL (ref 4.0–10.5)

## 2017-03-26 LAB — BASIC METABOLIC PANEL
ANION GAP: 8 (ref 5–15)
BUN: 7 mg/dL (ref 6–20)
CALCIUM: 8.8 mg/dL — AB (ref 8.9–10.3)
CO2: 28 mmol/L (ref 22–32)
Chloride: 105 mmol/L (ref 101–111)
Creatinine, Ser: 0.78 mg/dL (ref 0.44–1.00)
Glucose, Bld: 131 mg/dL — ABNORMAL HIGH (ref 65–99)
Potassium: 3.7 mmol/L (ref 3.5–5.1)
Sodium: 141 mmol/L (ref 135–145)

## 2017-03-26 MED ORDER — ACETAMINOPHEN 500 MG PO TABS
500.0000 mg | ORAL_TABLET | Freq: Four times a day (QID) | ORAL | Status: DC | PRN
  Administered 2017-03-26: 500 mg via ORAL
  Filled 2017-03-26: qty 1

## 2017-03-26 MED ORDER — AMLODIPINE BESYLATE 5 MG PO TABS: 2.5000 mg | ORAL_TABLET | Freq: Every day | ORAL | Status: DC | PRN

## 2017-03-26 NOTE — Progress Notes (Addendum)
[facility] Progress Note  2 Days Post-Op  Subjective: CC: abdominal soreness Patient feeling more sore today, thinks she may have walked a little too much yesterday. Denies nausea or vomiting. Tolerating ice chips. Having some bowel function, still some bloody stools. UOP good. VSS.   Objective: Vital signs in last 24 hours: Temp:  [97.8 F (36.6 C)-98.1 F (36.7 C)] 98.1 F (36.7 C) (03/01 0518) Pulse Rate:  [59-64] 59 (03/01 0518) Resp:  [17-18] 18 (03/01 0518) BP: (131-153)/(66-82) 153/80 (03/01 0518) SpO2:  [95 %-96 %] 95 % (03/01 0518) Weight:  [66 kg (145 lb 8.1 oz)] 66 kg (145 lb 8.1 oz) (03/01 0500) Last BM Date: 03/23/17  Intake/Output from previous day: 02/28 0701 - 03/01 0700 In: 4870 [P.O.:960; I.V.:3510; IV Piggyback:400] Out: 3050 [Urine:3050] Intake/Output this shift: No intake/output data recorded.  PE: Gen:  Alert, NAD, pleasant Card:  Regular rate and rhythm, pedal pulses 2+ BL Pulm:  Normal effort, clear to auscultation bilaterally Abd: Soft, appropriately tender, non-distended, bowel sounds present, no HSM, incisions C/D/I with honeycomb dressing over LLQ incision  Skin: warm and dry, no rashes  Psych: A&Ox3    Lab Results:  Recent Labs    03/25/17 0456 03/26/17 0414  WBC 11.8* 8.6  HGB 12.4 11.2*  HCT 36.3 34.3*  PLT 342 315   BMET Recent Labs    03/25/17 0456 03/26/17 0414  NA 140 141  K 4.0 3.7  CL 104 105  CO2 26 28  GLUCOSE 173* 131*  BUN 7 7  CREATININE 0.87 0.78  CALCIUM 9.1 8.8*   PT/INR No results for input(s): LABPROT, INR in the last 72 hours. CMP     Component Value Date/Time   NA 141 03/26/2017 0414   K 3.7 03/26/2017 0414   CL 105 03/26/2017 0414   CO2 28 03/26/2017 0414   GLUCOSE 131 (H) 03/26/2017 0414   BUN 7 03/26/2017 0414   CREATININE 0.78 03/26/2017 0414   CALCIUM 8.8 (L) 03/26/2017 0414   CALCIUM 8.8 11/17/2006 2239   PROT 7.4 03/22/2017 0428   ALBUMIN 3.9 03/22/2017 0428   AST 24  03/22/2017 0428   ALT 33 03/22/2017 0428   ALKPHOS 65 03/22/2017 0428   BILITOT 1.0 03/22/2017 0428   GFRNONAA >60 03/26/2017 0414   GFRAA >60 03/26/2017 0414   Lipase     Component Value Date/Time   LIPASE 24 03/16/2017 1213       Studies/Results: Dg C-arm 1-60 Min-no Report  Result Date: 03/24/2017 Fluoroscopy was utilized by the requesting physician.  No radiographic interpretation.    Anti-infectives: Anti-infectives (From admission, onward)   Start     Dose/Rate Route Frequency Ordered Stop   03/24/17 2000  piperacillin-tazobactam (ZOSYN) IVPB 3.375 g     3.375 g 12.5 mL/hr over 240 Minutes Intravenous Every 8 hours 03/24/17 1657 03/26/17 1959   03/24/17 0830  cefoTEtan in Dextrose 5% (CEFOTAN) IVPB 2 g  Status:  Discontinued     2 g Intravenous  Once 03/24/17 0734 03/24/17 1631   03/24/17 0600  cefoTEtan (CEFOTAN) 2 g in sodium chloride 0.9 % 100 mL IVPB     2 g 200 mL/hr over 30 Minutes Intravenous On call to O.R. 03/23/17 0844 03/24/17 1128   03/23/17 1400  neomycin (MYCIFRADIN) tablet 1,000 mg     1,000 mg Oral 3 times per day 03/23/17 0826 03/23/17 2344   03/23/17 1400  metroNIDAZOLE (FLAGYL) tablet 1,000 mg     1,000 mg Oral  3 times per day 03/23/17 0826 03/23/17 2343   03/23/17 0900  cefoTEtan (CEFOTAN) 2 g in sodium chloride 0.9 % 100 mL IVPB  Status:  Discontinued     2 g 200 mL/hr over 30 Minutes Intravenous On call to O.R. 03/23/17 3159 03/23/17 0844   03/17/17 0000  piperacillin-tazobactam (ZOSYN) IVPB 3.375 g  Status:  Discontinued     3.375 g 12.5 mL/hr over 240 Minutes Intravenous Every 8 hours 03/16/17 1626 03/24/17 1708   03/16/17 1630  piperacillin-tazobactam (ZOSYN) IVPB 3.375 g     3.375 g 100 mL/hr over 30 Minutes Intravenous  Once 03/16/17 1626 03/16/17 1737       Assessment/Plan Hypothyroidism - may resume PO thyroid replacement when tolerating CLD Hx of R [facility] surgery at [facility] 12/2016  Diverticulitis S/P lap assisted sigmoid colectomy  03/24/17 Dr. Lucia Gaskins     -POD#2   - Patient with great bowel sounds and some bowel function - advance to CLD   - continue to mobilize   - patient may shower  FEN: CLD, IVF VTE: SCDs, lovenox ID: IV zosyn 2/19>>    LOS: 10 days    Brigid Re , [facility] 03/26/2017, 9:20 AM Pager: [phone] Consults: [phone]  Agree with above. She worried a little about her BP. But otherwise doing okay.  Pathology showed diverticulitis with perforation.  Alphonsa Overall, MD, [facility] Pager: [phone] Office phone:  [phone]

## 2017-03-26 NOTE — Care Management Important Message (Signed)
Important Message  Patient Details  Name: [name] MRN: [id number] Date of Birth: 11/20/1950   Medicare Important Message Given:  Yes    Kerin Salen 03/26/2017, 10:36 AM

## 2017-03-26 NOTE — Progress Notes (Signed)
PROGRESS NOTE    [name]  ZOX:[id number] DOB: Dec 28, 1950 DOA: 03/16/2017 PCP: Burnis Medin, MD    Brief Narrative:  67 year old Caucasian female with a past medical history of essential hypertension, hypothyroidism, restless leg syndrome presented with abdominal pain.  Colitis end of December.  She underwent a CT scan in early January which cannot rule out a soft tissue density in that area.  She was treated with a prolonged course of antibiotics with Cipro and Flagyl.  She did feel better however her symptoms recurred last week.  She was placed on Augmentin by her gastroenterologist, Dr. Earlean Shawl.  However subsequently developed a fever and so was asked to come into the hospital.  She was hospitalized and placed on Zosyn.  Plan is for surgery,  2/27.  Patient underwent lap-assisted sigmoid colectomy by Dr. Lucia Gaskins and cystoscopy with bilateral retrograde pyelogram and insertion of bilateral ureteral stents and Dr. Tresa [place] on 2/27.  Assessment & Plan:   Active Problems:   Hypothyroidism   Pure hypercholesterolemia   GERD   Essential hypertension   Acute diverticulitis   Acute recurrent sigmoid diverticulosis with diverticulitis Patient failed outpatient treatment with Augmentin this month.  Repeat CT scan showed persistent diverticulitis in the sigmoid area.  She was started on Zosyn and general surgery was consulted. She underwent  lap-assisted sigmoid colectomy by Dr. Lucia Gaskins and cystoscopy with bilateral retrograde pyelogram and insertion of bilateral ureteral stents and Dr. Leafy Ro on 2/27. Today she has a small bowel movement is able to pass lattice.  Her nausea is better her abdominal pain has improved she is able to mobilize and walk in the hallway.  Surgery on board and following appreciate the recommendations,.  She continues to improve, and she was started on clear liquid diet.  She continues to have flatus, bowel movement.   Bradycardia EKG shows sinus bradycardia without  any T wave changes She is not on any rate limiting agents, unclear etiology. Her thyroid panel appears to be abnormal.  Echocardiogram does not show any significant abnormalities.  Post surgery her heart rate has improved and it staying between 50's to 60's.   History of hypothyroidism/thyroidectomy She is status post thyroidectomy in 2006 for goiter currently her TSH appears to be elevated at 16 with free T4 of 1.17 slightly on the higher norm.  Free T3 is slightly low at 1.8.  Resume her Synthroid. Recommend repeating thyroid panel in 3 to 4 weeks post discharge and recommend follow up with endocrinology.   Hypertension well-controlled.  Resume home medications.  Restless leg syndrome continue ropinirole. Resolved.   Hyperlipidemia resume home medications on discharge.  Abnormal UA patient's urine cultures are negative.  Mild leukocytosis probably reactive post surgery continue to monitor. Resolved.     DVT prophylaxis: scd's Code Status: full code Family Communication: none at bedside Disposition Plan: Possibly home in 2-3 days after she tolerates regular diet.    Consultants:   Surgery   Urology    Procedures:   ECHOCARDIOGRAM  LAPAROSCOPIC ASSISTED SIGMOID COLECTOMY,  Rigid sigmoidoscopy BILATERAL URETERAL STENT PLACEMENT CYSTOSCOPY/RETROGRADE/URETEROSCOPY     Antimicrobials: cefotan one dose.  Zosyn  Flagyl and neomycin.    Subjective: abd pain has improved.  Nausea, is better.  No vomiting.  2 bowel movements last night.    Objective: Vitals:   03/26/17 0500 03/26/17 0518 03/26/17 1438 03/26/17 1607  BP:  (!) 153/80 (!) 186/81 (!) 173/83  Pulse:  (!) 59  63  Resp:  18 18 18  Temp:  98.1 F (36.7 C)    TempSrc:  Oral Oral Oral  SpO2:  95%  94%  Weight: 66 kg (145 lb 8.1 oz)     Height:        Intake/Output Summary (Last 24 hours) at 03/26/2017 1624 Last data filed at 03/26/2017 1000 Gross per 24 hour  Intake 3085 ml  Output 2700 ml  Net  385 ml   Filed Weights   03/24/17 0500 03/25/17 0500 03/26/17 0500  Weight: 66.4 kg (146 lb 4.8 oz) 66 kg (145 lb 8 oz) 66 kg (145 lb 8.1 oz)    Examination:  General exam:no distress. Appears comfortable.  Respiratory system: good air entry bilateral. No wheezing or rhonchi.  Cardiovascular system: S1 & S2 heard bradycardia.  Gastrointestinal system: Abdomen is tender in the left lower lower quadrant. Soft , bowel sounds good.  Central nervous system: Alert and oriented. No focal neurological deficits. Extremities: Symmetric 5 x 5 power.  No pedal edema cyanosis or clubbing Skin: No rashes, lesions or ulcers Psychiatry: . Mood & affect appropriate.     Data Reviewed: I have personally reviewed following labs and imaging studies  CBC: Recent Labs  Lab 03/21/17 0522 03/23/17 0444 03/23/17 0840 03/25/17 0456 03/26/17 0414  WBC 4.8 8.2 8.2 11.8* 8.6  NEUTROABS  --   --  5.3 10.0* 5.9  HGB 11.4* 11.7* 12.6 12.4 11.2*  HCT 33.8* 34.2* 37.1 36.3 34.3*  MCV 97.1 96.6 95.4 96.3 98.8  PLT 271 337 340 342 485   Basic Metabolic Panel: Recent Labs  Lab 03/20/17 0953  03/22/17 0428 03/23/17 0444 03/23/17 0840 03/25/17 0456 03/26/17 0414  NA 138   < > 141 142 142 140 141  K 3.5   < > 3.6 3.6 3.6 4.0 3.7  CL 102   < > 105 108 107 104 105  CO2 22   < > 24 23 24 26 28   GLUCOSE 119*   < > 184* 125* 117* 173* 131*  BUN 6   < > 6 13 12 7 7   CREATININE 0.78   < > 0.79 0.88 0.95 0.87 0.78  CALCIUM 8.8*   < > 9.3 9.0 9.1 9.1 8.8*  MG 1.7  --   --   --   --   --   --    < > = values in this interval not displayed.   GFR: Estimated Creatinine Clearance: 57.9 mL/min (by C-G formula based on SCr of 0.78 mg/dL). Liver Function Tests: Recent Labs  Lab 03/22/17 0428  AST 24  ALT 33  ALKPHOS 65  BILITOT 1.0  PROT 7.4  ALBUMIN 3.9   No results for input(s): LIPASE, AMYLASE in the last 168 hours. No results for input(s): AMMONIA in the last 168 hours. Coagulation Profile: No  results for input(s): INR, PROTIME in the last 168 hours. Cardiac Enzymes: No results for input(s): CKTOTAL, CKMB, CKMBINDEX, TROPONINI in the last 168 hours. BNP (last 3 results) No results for input(s): PROBNP in the last 8760 hours. HbA1C: No results for input(s): HGBA1C in the last 72 hours. CBG: No results for input(s): GLUCAP in the last 168 hours. Lipid Profile: No results for input(s): CHOL, HDL, LDLCALC, TRIG, CHOLHDL, LDLDIRECT in the last 72 hours. Thyroid Function Tests: No results for input(s): TSH, T4TOTAL, FREET4, T3FREE, THYROIDAB in the last 72 hours. Anemia Panel: No results for input(s): VITAMINB12, FOLATE, FERRITIN, TIBC, IRON, RETICCTPCT in the last 72 hours. Sepsis Labs: No results for  input(s): PROCALCITON, LATICACIDVEN in the last 168 hours.  Recent Results (from the past 240 hour(s))  Culture, Urine     Status: None   Collection Time: 03/17/17  2:50 AM  Result Value Ref Range Status   Specimen Description   Final    URINE, CLEAN CATCH Performed at [facility], [place] [address], [place], [place] [address]    Special Requests   Final    NONE Performed at [facility], [place] [address], [place], [place] [address]    Culture   Final    NO GROWTH Performed at [facility], [place] [address], [place], [place] [address]    Report Status 03/18/2017 FINAL  Final  MRSA PCR Screening     Status: Abnormal   Collection Time: 03/23/17  4:35 PM  Result Value Ref Range Status   MRSA by PCR POSITIVE (A) NEGATIVE Final    Comment:        The GeneXpert MRSA Assay (FDA approved for NASAL specimens only), is one component of a comprehensive MRSA colonization surveillance program. It is not intended to diagnose MRSA infection nor to guide or monitor treatment for MRSA infections. RESULT CALLED TO, READ BACK BY AND VERIFIED WITH: A PEREZ,RN [id number] @ 2143 BY J SCOTTON Performed at [facility], [place]  [address] [place], [place] [address]          Radiology Studies: No results found.      Scheduled Meds: . Chlorhexidine Gluconate Cloth  6 each Topical Q0600  . levothyroxine  112 mcg Oral QAC breakfast  . mupirocin ointment  1 application Nasal BID  . pantoprazole (PROTONIX) IV  40 mg Intravenous Q24H  . rOPINIRole  0.5 mg Oral TID  . scopolamine  1 patch Transdermal Q72H   Continuous Infusions:    LOS: 10 days    Time spent: 35 min    Hosie Poisson, MD [facility] Pager [phone]  If 7PM-7AM, please contact night-coverage [email or web address] Password [facility] 03/26/2017, 4:24 PM

## 2017-03-27 NOTE — Progress Notes (Signed)
PROGRESS NOTE    [name]  NWG:[id number] DOB: 03-18-1950 DOA: 03/16/2017 PCP: Burnis Medin, MD    Brief Narrative:  67 year old Caucasian female with a past medical history of essential hypertension, hypothyroidism, restless leg syndrome presented with abdominal pain.  Colitis end of December.  She underwent a CT scan in early January which cannot rule out a soft tissue density in that area.  She was treated with a prolonged course of antibiotics with Cipro and Flagyl.  She did feel better however her symptoms recurred last week.  She was placed on Augmentin by her gastroenterologist, Dr. Earlean Shawl.  However subsequently developed a fever and so was asked to come into the hospital.  She was hospitalized and placed on Zosyn.  Plan is for surgery,  2/27.  Patient underwent lap-assisted sigmoid colectomy by Dr. Lucia Gaskins and cystoscopy with bilateral retrograde pyelogram and insertion of bilateral ureteral stents and Dr. Tresa Moore on 2/27.  Assessment & Plan:   Active Problems:   Hypothyroidism   Pure hypercholesterolemia   GERD   Essential hypertension   Acute diverticulitis   Acute recurrent sigmoid diverticulosis with diverticulitis Patient failed outpatient treatment with Augmentin this month.  Repeat CT scan showed persistent diverticulitis in the sigmoid area.  She was started on Zosyn and general surgery was consulted. She underwent  lap-assisted sigmoid colectomy by Dr. Lucia Gaskins and cystoscopy with bilateral retrograde pyelogram and insertion of bilateral ureteral stents and Dr. Leafy Ro on 2/27. BM last night.  Her nausea is better her abdominal pain has improved she is able to mobilize and walk in the hallway.  Surgery on board and following appreciate the recommendations,.  She continues to improve, and she was started on clear liquid diet. As she is able to tolerate clears , advanced to full liquid diet.   Bradycardia EKG shows sinus bradycardia without any T wave changes She is not  on any rate limiting agents, unclear etiology. Her thyroid panel appears to be abnormal.  Echocardiogram does not show any significant abnormalities.  Post surgery her heart rate has improved and it staying between 50's to 60's.   History of hypothyroidism/thyroidectomy She is status post thyroidectomy in 2006 for goiter currently her TSH appears to be elevated at 16 with free T4 of 1.17 slightly on the higher norm.  Free T3 is slightly low at 1.8.  Resume her Synthroid. Recommend repeating thyroid panel in 3 to 4 weeks post discharge and recommend follow up with endocrinology.   Hypertension well-controlled after starting norvasc.  Resume home medications.  Restless leg syndrome continue ropinirole. Resolved.   Hyperlipidemia resume home medications on discharge.  Abnormal UA patient's urine cultures are negative.  Mild leukocytosis probably reactive post surgery continue to monitor. Resolved.     DVT prophylaxis: scd's Code Status: full code Family Communication: none at bedside Disposition Plan: Possibly home in 2 days.   Consultants:   Surgery   Urology    Procedures:   ECHOCARDIOGRAM  LAPAROSCOPIC ASSISTED SIGMOID COLECTOMY,  Rigid sigmoidoscopy BILATERAL URETERAL STENT PLACEMENT CYSTOSCOPY/RETROGRADE/URETEROSCOPY     Antimicrobials: cefotan one dose.  Zosyn  Flagyl and neomycin.    Subjective: Nausea, abd pain improved. Passing flatus.    Objective: Vitals:   03/26/17 1607 03/26/17 2010 03/27/17 0543 03/27/17 1355  BP: (!) 173/83 (!) 147/67 140/83 127/79  Pulse: 63 (!) 58 67 77  Resp: 18 18 18 20   Temp:   98.1 F (36.7 C) 97.9 F (36.6 C)  TempSrc: Oral   Oral  SpO2:  94% 94% 95% 94%  Weight:      Height:        Intake/Output Summary (Last 24 hours) at 03/27/2017 1641 Last data filed at 03/27/2017 1300 Gross per 24 hour  Intake 120 ml  Output 650 ml  Net -530 ml   Filed Weights   03/24/17 0500 03/25/17 0500 03/26/17 0500  Weight: 66.4 kg  (146 lb 4.8 oz) 66 kg (145 lb 8 oz) 66 kg (145 lb 8.1 oz)    Examination:  General exam:no distress. Appears comfortable.  Respiratory system: good air entry bilateral. No wheezing or rhonchi.  Cardiovascular system: S1 & S2 heard bradycardia.  Gastrointestinal system: tenderness in the abd in the left lower quadrant improved. Bowel movement good.  Central nervous system: Alert and oriented. No focal neurological deficits. Extremities: Symmetric 5 x 5 power.  No pedal edema cyanosis or clubbing Skin: No rashes, lesions or ulcers Psychiatry: . Mood & affect appropriate.     Data Reviewed: I have personally reviewed following labs and imaging studies  CBC: Recent Labs  Lab 03/21/17 0522 03/23/17 0444 03/23/17 0840 03/25/17 0456 03/26/17 0414  WBC 4.8 8.2 8.2 11.8* 8.6  NEUTROABS  --   --  5.3 10.0* 5.9  HGB 11.4* 11.7* 12.6 12.4 11.2*  HCT 33.8* 34.2* 37.1 36.3 34.3*  MCV 97.1 96.6 95.4 96.3 98.8  PLT 271 337 340 342 175   Basic Metabolic Panel: Recent Labs  Lab 03/22/17 0428 03/23/17 0444 03/23/17 0840 03/25/17 0456 03/26/17 0414  NA 141 142 142 140 141  K 3.6 3.6 3.6 4.0 3.7  CL 105 108 107 104 105  CO2 24 23 24 26 28   GLUCOSE 184* 125* 117* 173* 131*  BUN 6 13 12 7 7   CREATININE 0.79 0.88 0.95 0.87 0.78  CALCIUM 9.3 9.0 9.1 9.1 8.8*   GFR: Estimated Creatinine Clearance: 57.9 mL/min (by C-G formula based on SCr of 0.78 mg/dL). Liver Function Tests: Recent Labs  Lab 03/22/17 0428  AST 24  ALT 33  ALKPHOS 65  BILITOT 1.0  PROT 7.4  ALBUMIN 3.9   No results for input(s): LIPASE, AMYLASE in the last 168 hours. No results for input(s): AMMONIA in the last 168 hours. Coagulation Profile: No results for input(s): INR, PROTIME in the last 168 hours. Cardiac Enzymes: No results for input(s): CKTOTAL, CKMB, CKMBINDEX, TROPONINI in the last 168 hours. BNP (last 3 results) No results for input(s): PROBNP in the last 8760 hours. HbA1C: No results for  input(s): HGBA1C in the last 72 hours. CBG: No results for input(s): GLUCAP in the last 168 hours. Lipid Profile: No results for input(s): CHOL, HDL, LDLCALC, TRIG, CHOLHDL, LDLDIRECT in the last 72 hours. Thyroid Function Tests: No results for input(s): TSH, T4TOTAL, FREET4, T3FREE, THYROIDAB in the last 72 hours. Anemia Panel: No results for input(s): VITAMINB12, FOLATE, FERRITIN, TIBC, IRON, RETICCTPCT in the last 72 hours. Sepsis Labs: No results for input(s): PROCALCITON, LATICACIDVEN in the last 168 hours.  Recent Results (from the past 240 hour(s))  MRSA PCR Screening     Status: Abnormal   Collection Time: 03/23/17  4:35 PM  Result Value Ref Range Status   MRSA by PCR POSITIVE (A) NEGATIVE Final    Comment:        The GeneXpert MRSA Assay (FDA approved for NASAL specimens only), is one component of a comprehensive MRSA colonization surveillance program. It is not intended to diagnose MRSA infection nor to guide or monitor treatment for MRSA infections.  RESULT CALLED TO, READ BACK BY AND VERIFIED WITH: A PEREZ,RN [id number] @ 2143 BY J SCOTTON Performed at [facility], [place] [address], [place], [place] [address]          Radiology Studies: No results found.      Scheduled Meds: . Chlorhexidine Gluconate Cloth  6 each Topical Q0600  . levothyroxine  112 mcg Oral QAC breakfast  . mupirocin ointment  1 application Nasal BID  . pantoprazole (PROTONIX) IV  40 mg Intravenous Q24H  . rOPINIRole  0.5 mg Oral TID  . scopolamine  1 patch Transdermal Q72H   Continuous Infusions:    LOS: 11 days    Time spent: 35 min    Hosie Poisson, MD [facility] Pager [phone]  If 7PM-7AM, please contact night-coverage [email or web address] Password [facility] 03/27/2017, 4:41 PM

## 2017-03-27 NOTE — Progress Notes (Signed)
Patient ID: [name], female   DOB: December 31, 1950, 67 y.o.   MRN: [id number] 3 Days Post-Op   Subjective: Feels better today.  Sore, but no major complaints.  Has had another bowel movement.  Tolerating clear liquids without nausea or discomfort.  Objective: Vital signs in last 24 hours: Temp:  [98.1 F (36.7 C)] 98.1 F (36.7 C) (03/02 0543) Pulse Rate:  [58-67] 67 (03/02 0543) Resp:  [18] 18 (03/02 0543) BP: (140-186)/(67-83) 140/83 (03/02 0543) SpO2:  [94 %-95 %] 95 % (03/02 0543) Last BM Date: 03/26/17  Intake/Output from previous day: 03/01 0701 - 03/02 0700 In: 500 [I.V.:500] Out: 650 [Urine:650] Intake/Output this shift: No intake/output data recorded.  General appearance: alert, cooperative and no distress GI: Moderate peri-incisional tenderness.  Nondistended. Incision/Wound: No erythema or drainage.  Lab Results:  Recent Labs    03/25/17 0456 03/26/17 0414  WBC 11.8* 8.6  HGB 12.4 11.2*  HCT 36.3 34.3*  PLT 342 315   BMET Recent Labs    03/25/17 0456 03/26/17 0414  NA 140 141  K 4.0 3.7  CL 104 105  CO2 26 28  GLUCOSE 173* 131*  BUN 7 7  CREATININE 0.87 0.78  CALCIUM 9.1 8.8*     Studies/Results: No results found.  Anti-infectives: Anti-infectives (From admission, onward)   Start     Dose/Rate Route Frequency Ordered Stop   03/24/17 2000  piperacillin-tazobactam (ZOSYN) IVPB 3.375 g     3.375 g 12.5 mL/hr over 240 Minutes Intravenous Every 8 hours 03/24/17 1657 03/26/17 1611   03/24/17 0830  cefoTEtan in Dextrose 5% (CEFOTAN) IVPB 2 g  Status:  Discontinued     2 g Intravenous  Once 03/24/17 0734 03/24/17 1631   03/24/17 0600  cefoTEtan (CEFOTAN) 2 g in sodium chloride 0.9 % 100 mL IVPB     2 g 200 mL/hr over 30 Minutes Intravenous On call to O.R. 03/23/17 0844 03/24/17 1128   03/23/17 1400  neomycin (MYCIFRADIN) tablet 1,000 mg     1,000 mg Oral 3 times per day 03/23/17 0826 03/23/17 2344   03/23/17 1400  metroNIDAZOLE (FLAGYL)  tablet 1,000 mg     1,000 mg Oral 3 times per day 03/23/17 0826 03/23/17 2343   03/23/17 0900  cefoTEtan (CEFOTAN) 2 g in sodium chloride 0.9 % 100 mL IVPB  Status:  Discontinued     2 g 200 mL/hr over 30 Minutes Intravenous On call to O.R. 03/23/17 5093 03/23/17 0844   03/17/17 0000  piperacillin-tazobactam (ZOSYN) IVPB 3.375 g  Status:  Discontinued     3.375 g 12.5 mL/hr over 240 Minutes Intravenous Every 8 hours 03/16/17 1626 03/24/17 1708   03/16/17 1630  piperacillin-tazobactam (ZOSYN) IVPB 3.375 g     3.375 g 100 mL/hr over 30 Minutes Intravenous  Once 03/16/17 1626 03/16/17 1737      Assessment/Plan: s/p Procedure(s): LAPAROSCOPIC ASSISTED LEFT  SIGMOID COLECTOMY BILATERAL URETERAL CATHETER  PLACEMENT CYSTOSCOPY/RETROGRADE/URETEROSCOPY SIGMOIDOSCOPY Doing well without apparent complication.  Advance to full liquid diet.   LOS: 11 days    Edward Jolly 03/27/2017

## 2017-03-28 MED ORDER — PHENOL 1.4 % MT LIQD: 1.0000 | OROMUCOSAL | Status: DC | PRN

## 2017-03-28 MED ORDER — ACETAMINOPHEN 500 MG PO TABS
1000.0000 mg | ORAL_TABLET | Freq: Three times a day (TID) | ORAL | Status: DC
  Administered 2017-03-28 – 2017-03-31 (×10): 1000 mg via ORAL
  Filled 2017-03-28 (×10): qty 2

## 2017-03-28 MED ORDER — METHOCARBAMOL 500 MG PO TABS
1000.0000 mg | ORAL_TABLET | Freq: Four times a day (QID) | ORAL | Status: DC | PRN
  Administered 2017-03-28 – 2017-03-30 (×4): 1000 mg via ORAL
  Filled 2017-03-28 (×4): qty 2

## 2017-03-28 MED ORDER — ROSUVASTATIN CALCIUM 10 MG PO TABS
10.0000 mg | ORAL_TABLET | Freq: Every day | ORAL | Status: DC
  Administered 2017-03-28 – 2017-03-31 (×4): 10 mg via ORAL
  Filled 2017-03-28 (×4): qty 1

## 2017-03-28 MED ORDER — SACCHAROMYCES BOULARDII 250 MG PO CAPS
250.0000 mg | ORAL_CAPSULE | Freq: Two times a day (BID) | ORAL | Status: DC
  Administered 2017-03-28 – 2017-03-31 (×7): 250 mg via ORAL
  Filled 2017-03-28 (×7): qty 1

## 2017-03-28 MED ORDER — OXYCODONE HCL 5 MG PO TABS
5.0000 mg | ORAL_TABLET | ORAL | Status: DC | PRN
  Administered 2017-03-28 – 2017-03-30 (×7): 5 mg via ORAL
  Filled 2017-03-28 (×7): qty 1

## 2017-03-28 MED ORDER — HYDROMORPHONE HCL 1 MG/ML IJ SOLN: 0.5000 mg | INTRAMUSCULAR | Status: DC | PRN

## 2017-03-28 MED ORDER — ONDANSETRON 4 MG PO TBDP: 4.0000 mg | ORAL_TABLET | Freq: Three times a day (TID) | ORAL | Status: DC | PRN

## 2017-03-28 MED ORDER — SODIUM CHLORIDE 0.9% FLUSH
3.0000 mL | Freq: Two times a day (BID) | INTRAVENOUS | Status: DC
  Administered 2017-03-28 – 2017-03-30 (×3): 3 mL via INTRAVENOUS

## 2017-03-28 MED ORDER — METOCLOPRAMIDE HCL 5 MG/ML IJ SOLN: 10.0000 mg | Freq: Four times a day (QID) | INTRAMUSCULAR | Status: DC | PRN

## 2017-03-28 MED ORDER — IRBESARTAN 150 MG PO TABS
150.0000 mg | ORAL_TABLET | Freq: Every day | ORAL | Status: DC
  Administered 2017-03-28 – 2017-03-30 (×3): 150 mg via ORAL
  Filled 2017-03-28 (×4): qty 1

## 2017-03-28 MED ORDER — TAB-A-VITE/IRON PO TABS
1.0000 | ORAL_TABLET | Freq: Every day | ORAL | Status: DC
  Administered 2017-03-28 – 2017-03-30 (×3): 1 via ORAL
  Filled 2017-03-28 (×4): qty 1

## 2017-03-28 MED ORDER — GUAIFENESIN-DM 100-10 MG/5ML PO SYRP: 10.0000 mL | ORAL_SOLUTION | ORAL | Status: DC | PRN

## 2017-03-28 MED ORDER — PANTOPRAZOLE SODIUM 40 MG PO TBEC: 40.0000 mg | DELAYED_RELEASE_TABLET | Freq: Every day | ORAL | Status: DC

## 2017-03-28 MED ORDER — LIP MEDEX EX OINT
1.0000 "application " | TOPICAL_OINTMENT | Freq: Two times a day (BID) | CUTANEOUS | Status: DC
  Administered 2017-03-28 – 2017-03-30 (×6): 1 via TOPICAL
  Filled 2017-03-28: qty 7

## 2017-03-28 MED ORDER — METHOCARBAMOL 500 MG PO TABS: 1000.0000 mg | ORAL_TABLET | Freq: Four times a day (QID) | ORAL | Status: DC | PRN

## 2017-03-28 MED ORDER — PANTOPRAZOLE SODIUM 40 MG PO TBEC
40.0000 mg | DELAYED_RELEASE_TABLET | Freq: Every day | ORAL | Status: DC
  Administered 2017-03-28 – 2017-03-31 (×4): 40 mg via ORAL
  Filled 2017-03-28 (×4): qty 1

## 2017-03-28 MED ORDER — PSYLLIUM 95 % PO PACK
1.0000 | PACK | Freq: Every day | ORAL | Status: DC
  Administered 2017-03-28 – 2017-03-31 (×4): 1 via ORAL
  Filled 2017-03-28 (×4): qty 1

## 2017-03-28 MED ORDER — GABAPENTIN 300 MG PO CAPS
300.0000 mg | ORAL_CAPSULE | Freq: Every day | ORAL | Status: AC
  Administered 2017-03-28 – 2017-03-30 (×3): 300 mg via ORAL
  Filled 2017-03-28 (×3): qty 1

## 2017-03-28 MED ORDER — PROCHLORPERAZINE EDISYLATE 5 MG/ML IJ SOLN: 5.0000 mg | INTRAMUSCULAR | Status: DC | PRN

## 2017-03-28 MED ORDER — LEVOTHYROXINE SODIUM 88 MCG PO TABS: 88.0000 ug | ORAL_TABLET | Freq: Every day | ORAL | Status: DC

## 2017-03-28 MED ORDER — SODIUM CHLORIDE 0.9 % IV SOLN: 250.0000 mL | INTRAVENOUS | Status: DC | PRN

## 2017-03-28 MED ORDER — DIPHENHYDRAMINE HCL 50 MG/ML IJ SOLN: 12.5000 mg | Freq: Four times a day (QID) | INTRAMUSCULAR | Status: DC | PRN

## 2017-03-28 MED ORDER — ENSURE SURGERY PO LIQD
237.0000 mL | Freq: Two times a day (BID) | ORAL | Status: DC
  Administered 2017-03-28 – 2017-03-30 (×4): 237 mL via ORAL
  Filled 2017-03-28 (×8): qty 237

## 2017-03-28 MED ORDER — ALUM & MAG HYDROXIDE-SIMETH 200-200-20 MG/5ML PO SUSP: 30.0000 mL | Freq: Four times a day (QID) | ORAL | Status: DC | PRN

## 2017-03-28 MED ORDER — MAGIC MOUTHWASH
15.0000 mL | Freq: Four times a day (QID) | ORAL | Status: DC | PRN
  Filled 2017-03-28: qty 15

## 2017-03-28 MED ORDER — HYDROCORTISONE 1 % EX CREA: 1.0000 "application " | TOPICAL_CREAM | Freq: Three times a day (TID) | CUTANEOUS | Status: DC | PRN

## 2017-03-28 MED ORDER — MENTHOL 3 MG MT LOZG: 1.0000 | LOZENGE | OROMUCOSAL | Status: DC | PRN

## 2017-03-28 MED ORDER — DIPHENHYDRAMINE HCL 25 MG PO CAPS: 25.0000 mg | ORAL_CAPSULE | Freq: Four times a day (QID) | ORAL | Status: DC | PRN

## 2017-03-28 MED ORDER — LACTATED RINGERS IV BOLUS (SEPSIS): 1000.0000 mL | Freq: Three times a day (TID) | INTRAVENOUS | Status: AC | PRN

## 2017-03-28 MED ORDER — HYDROCORTISONE 2.5 % RE CREA: 1.0000 "application " | TOPICAL_CREAM | Freq: Four times a day (QID) | RECTAL | Status: DC | PRN

## 2017-03-28 MED ORDER — SODIUM CHLORIDE 0.9% FLUSH
3.0000 mL | INTRAVENOUS | Status: DC | PRN
Start: 2017-03-28 — End: 2017-03-31

## 2017-03-28 MED ORDER — METHOCARBAMOL 1000 MG/10ML IJ SOLN
1000.0000 mg | Freq: Four times a day (QID) | INTRAMUSCULAR | Status: DC | PRN
  Filled 2017-03-28: qty 10

## 2017-03-28 MED ORDER — METHOCARBAMOL 1000 MG/10ML IJ SOLN
1000.0000 mg | Freq: Four times a day (QID) | INTRAVENOUS | Status: DC | PRN
  Filled 2017-03-28: qty 10

## 2017-03-28 NOTE — Progress Notes (Signed)
Key Points: Use following P&T approved IV to PO non-antibiotic change policy.  Description contains the criteria that are approved Note: Policy Excludes:  Esophagectomy patientsPHARMACIST - PHYSICIAN COMMUNICATION DR:   Karleen Hampshire CONCERNING: IV to Oral Route Change Policy  RECOMMENDATION: This patient is receiving protonix by the intravenous route.  Based on criteria approved by the Pharmacy and Therapeutics Committee, the intravenous medication(s) is/are being converted to the equivalent oral dose form(s).   DESCRIPTION: These criteria include:  The patient is eating (either orally or via tube) and/or has been taking other orally administered medications for a least 24 hours  The patient has no evidence of active gastrointestinal bleeding or impaired GI absorption (gastrectomy, short bowel, patient on TNA or NPO).  If you have questions about this conversion, please contact the Pharmacy Department  []   [phone] )  [facility] []   [phone] )  [facility]  []   [phone] )  [facility] [x]   [phone] )  [place], [place], [facility] 03/28/2017 8:10 AM

## 2017-03-28 NOTE — Progress Notes (Signed)
[address] [place], [place] [address] Phone: [phone]  FAX: [place] [phone] [birth year]  CARE TEAM:  PCP: Burnis Medin, MD  Outpatient Care Team: Patient Care Team: Panosh, Standley Brooking, MD as PCP - General Sallyanne Havers, MD as Referring Physician (Orthopedic Surgery) Lowella Bandy, MD as Attending Physician (Urology) Josue Hector, MD as Consulting Physician (Cardiology) Richmond Campbell, MD as Consulting Physician (Gastroenterology)  Inpatient Treatment Team: Treatment Team: Attending Provider: Hosie Poisson, MD; Consulting Physician: Edison Pace, Md, MD; Registered Nurse: Danton Clap, RN; Technician: Abbe Amsterdam, NT; Technician: Sueanne Margarita, NT; Technician: Octavio Manns, NT; Registered Nurse: Heloise Ochoa, RN; Rounding Team: Fatima Blank, MD; Technician: Etheleen Sia, [place]; Registered Nurse: Dorita Fray, RN; Registered Nurse: Sunny Schlein, RN; Registered Nurse: Lenice Pressman, RN   Problem List:   Active Problems:   Hypothyroidism   Pure hypercholesterolemia   GERD   Essential hypertension   Acute diverticulitis   4 Days Post-Op  03/24/2017  POSTOP DIAGNOSIS:   Acute and chronic sigmoid colon diverticulitis  PROCEDURE:   LAPAROSCOPIC ASSISTED SIGMOID COLECTOMY mobilization of the splenic flexure rigid sigmoidoscopy - [place] STENT PLACEMENT CYSTOSCOPY/RETROGRADE/URETEROSCOPY - [place]  Diagnosis Colon, segmental resection, Sigmoid colon - suture proximal - DIVERTICULITIS WITH PERFORATION AND PERICOLONIC ABSCESS. - TWO BENIGN LYMPH NODES (0/2). - NO EVIDENCE OF MALIGNANCY. Claudette Laws MD Pathologist, Electronic Signature (Case signed 03/26/2017)  Assessment  Slowly improving  Plan: -path benign - reassuring - path report given to pt -adv solid diet -stop IVF -improve pain control - sch tylenol, gabapentin QHS.  Home  [place].  changhe narcotics to oxyIR/Dilaudid PRN -bowel regimen -HTN control -hypothyroid - on inc Synthroid per TRH primary -VTE prophylaxis- SCDs, etc -mobilize as tolerated to help recovery  -D/C patient from hospital when patient meets criteria (anticipate in 1-2 day(s)):  Tolerating oral intake well Ambulating well Adequate pain control without IV medications Urinating  Having flatus Disposition planning in place           40 minutes spent in review, evaluation, examination, counseling, and coordination of care.  More than 50% of that time was spent in counseling.  Adin Hector, M.D., F.A.C.S. Gastrointestinal and Minimally Invasive Surgery [facility] [address], [place],  [address] [phone] Main / Paging   03/28/2017    Subjective: (Chief complaint)  Tired Sore Tolerating fulls Walking more Nauseated a little w Vicodin  Objective:  Vital signs:  Vitals:   03/27/17 1355 03/27/17 2235 03/28/17 0500 03/28/17 0615  BP: 127/79 139/77  (!) 160/77  Pulse: 77 83  68  Resp: 20 18  18   Temp: 97.9 F (36.6 C) 98.3 F (36.8 C)  98.4 F (36.9 C)  TempSrc: Oral Oral  Oral  SpO2: 94% 93%  92%  Weight:   70.9 kg (156 lb 4 oz)   Height:        Last BM Date: 03/26/17  Intake/Output   Yesterday:  03/02 0701 - 03/03 0700 In: 410 [P.O.:410] Out: -  This shift:  No intake/output data recorded.  Bowel function:  Flatus: YES  BM:  YES  Drain: (No drain)   Physical Exam:  General: Pt awake/alert/oriented x4 in mild acute distress.  Not toxic but tired Eyes: PERRL, normal EOM.  Sclera clear.  No icterus Neuro: CN II-XII intact w/o focal sensory/motor deficits. Lymph: No  head/neck/groin lymphadenopathy Psych:  No delerium/psychosis/paranoia HENT: Normocephalic, Mucus membranes moist.  No thrush Neck: Supple, No tracheal deviation Chest: No chest wall pain w good excursion CV:  Pulses intact.   Regular rhythm MS: Normal AROM mjr joints.  No obvious deformity  Abdomen: Soft.  Nondistended.  Mildly tender at incisions only.  No evidence of peritonitis.  No incarcerated hernias.  Ext:  No deformity.  No mjr edema.  No cyanosis Skin: No petechiae / purpura  Results:   Labs: No results found for this or any previous visit (from the past 48 hour(s)).  Imaging / Studies: No results found.  Medications / Allergies: per chart  Antibiotics: Anti-infectives (From admission, onward)   Start     Dose/Rate Route Frequency Ordered Stop   03/24/17 2000  piperacillin-tazobactam (ZOSYN) IVPB 3.375 g     3.375 g 12.5 mL/hr over 240 Minutes Intravenous Every 8 hours 03/24/17 1657 03/26/17 1611   03/24/17 0830  cefoTEtan in Dextrose 5% (CEFOTAN) IVPB 2 g  Status:  Discontinued     2 g Intravenous  Once 03/24/17 0734 03/24/17 1631   03/24/17 0600  cefoTEtan (CEFOTAN) 2 g in sodium chloride 0.9 % 100 mL IVPB     2 g 200 mL/hr over 30 Minutes Intravenous On call to O.R. 03/23/17 0844 03/24/17 1128   03/23/17 1400  neomycin (MYCIFRADIN) tablet 1,000 mg     1,000 mg Oral 3 times per day 03/23/17 0826 03/23/17 2344   03/23/17 1400  metroNIDAZOLE (FLAGYL) tablet 1,000 mg     1,000 mg Oral 3 times per day 03/23/17 0826 03/23/17 2343   03/23/17 0900  cefoTEtan (CEFOTAN) 2 g in sodium chloride 0.9 % 100 mL IVPB  Status:  Discontinued     2 g 200 mL/hr over 30 Minutes Intravenous On call to O.R. 03/23/17 4827 03/23/17 0844   03/17/17 0000  piperacillin-tazobactam (ZOSYN) IVPB 3.375 g  Status:  Discontinued     3.375 g 12.5 mL/hr over 240 Minutes Intravenous Every 8 hours 03/16/17 1626 03/24/17 1708   03/16/17 1630  piperacillin-tazobactam (ZOSYN) IVPB 3.375 g     3.375 g 100 mL/hr over 30 Minutes Intravenous  Once 03/16/17 1626 03/16/17 1737        Note: Portions of this report may have been transcribed using voice recognition software. Every effort was made to ensure accuracy; however,  inadvertent computerized transcription errors may be present.   Any transcriptional errors that result from this process are unintentional.     Adin Hector, M.D., F.A.C.S. Gastrointestinal and Minimally Invasive Surgery [facility] [address], [place], [place] [address] [phone] Main / Paging   03/28/2017

## 2017-03-28 NOTE — Progress Notes (Signed)
PROGRESS NOTE    [name]  OVF:[id number] DOB: 01-Feb-1950 DOA: 03/16/2017 PCP: Burnis Medin, MD    Brief Narrative:  68 year old Caucasian female with a past medical history of essential hypertension, hypothyroidism, restless leg syndrome presented with abdominal pain.  Colitis end of December.  She underwent a CT scan in early January which cannot rule out a soft tissue density in that area.  She was treated with a prolonged course of antibiotics with Cipro and Flagyl.  She did feel better however her symptoms recurred last week.  She was placed on Augmentin by her gastroenterologist, Dr. Earlean Shawl.  However subsequently developed a fever and so was asked to come into the hospital.  She was hospitalized and placed on Zosyn. She was found to have diveriticulitis with perforation and pericolonic abscess formation. On  2/27  Patient underwent lap-assisted sigmoid colectomy by Dr. Lucia Gaskins and cystoscopy with bilateral retrograde pyelogram and insertion of bilateral ureteral stents and Dr. Tresa Moore on 2/27.  Pt continues to improve, diet started and advanced to soft today. Appreciate surgery recommendations.   Assessment & Plan:   Principal Problem:   Diverticulitis s/p lap sigmoid colectomy 03/24/2017 Active Problems:   Hypothyroidism   Pure hypercholesterolemia   GERD   Essential hypertension   Acute recurrent sigmoid diverticulosis with diverticulitis, with perforation and pericolonic abscess:  Patient failed outpatient treatment with Augmentin this month.  Repeat CT scan showed persistent diverticulitis in the sigmoid area.  She was started on Zosyn and general surgery was consulted. She underwent  lap-assisted sigmoid colectomy by Dr. Lucia Gaskins and cystoscopy with bilateral retrograde pyelogram and insertion of bilateral ureteral stents and Dr. Leafy Ro on 2/27. Pt continues to improve with bowel movements, started on clears and advanced to soft today.  Pathology is negative for malignancy.      Bradycardia EKG shows sinus bradycardia without any T wave changes She is not on any rate limiting agents, unclear etiology. Her thyroid panel appears to be abnormal.  Echocardiogram does not show any significant abnormalities.  Post surgery her heart rate has improved and it staying between 50's to 60's.   History of hypothyroidism/thyroidectomy She is status post thyroidectomy in 2006 for goiter currently her TSH appears to be elevated at 16 with free T4 of 1.17 slightly on the higher norm.  Free T3 is slightly low at 1.8.  Resume her Synthroid. Recommend repeating thyroid panel in 3 to 4 weeks post discharge and recommend follow up with endocrinology.   Hypertension well-controlled after starting norvasc.  Resume home medications.  Restless leg syndrome continue ropinirole. Resolved.   Hyperlipidemia resume home medications on discharge.  Abnormal UA patient's urine cultures are negative.  Mild leukocytosis probably reactive post surgery continue to monitor. Resolved.     DVT prophylaxis: scd's Code Status: full code Family Communication: family  at bedside Disposition Plan: home tomorrow   Consultants:   Surgery   Urology    Procedures:   ECHOCARDIOGRAM  LAPAROSCOPIC ASSISTED SIGMOID COLECTOMY,  Rigid sigmoidoscopy BILATERAL URETERAL STENT PLACEMENT CYSTOSCOPY/RETROGRADE/URETEROSCOPY     Antimicrobials: cefotan one dose.  Zosyn  Flagyl and neomycin.    Subjective: Nausea, abd pain improved. Passing flatus.    Objective: Vitals:   03/28/17 0500 03/28/17 0615 03/28/17 1012 03/28/17 1509  BP:  (!) 160/77 139/83 140/84  Pulse:  68 73 65  Resp:  18 18 16   Temp:  98.4 F (36.9 C) 98.2 F (36.8 C) 97.9 F (36.6 C)  TempSrc:  Oral Oral Oral  SpO2:  92% 95% 95%  Weight: 70.9 kg (156 lb 4 oz)     Height:        Intake/Output Summary (Last 24 hours) at 03/28/2017 1653 Last data filed at 03/28/2017 1100 Gross per 24 hour  Intake 410 ml  Output -   Net 410 ml   Filed Weights   03/25/17 0500 03/26/17 0500 03/28/17 0500  Weight: 66 kg (145 lb 8 oz) 66 kg (145 lb 8.1 oz) 70.9 kg (156 lb 4 oz)    Examination:  General exam: calm and comfortable. Not in distress.  Respiratory system: air entry fair.  Cardiovascular system: S1 & S2 heard RRR.  Gastrointestinal system: abd soft non tender non distended bowel sounds heard.  Central nervous system: Alert and oriented. Non focal.   Extremities: no cyanosis.  Skin: No rashes, lesions or ulcers Psychiatry: . Mood & affect appropriate.     Data Reviewed: I have personally reviewed following labs and imaging studies  CBC: Recent Labs  Lab 03/23/17 0444 03/23/17 0840 03/25/17 0456 03/26/17 0414  WBC 8.2 8.2 11.8* 8.6  NEUTROABS  --  5.3 10.0* 5.9  HGB 11.7* 12.6 12.4 11.2*  HCT 34.2* 37.1 36.3 34.3*  MCV 96.6 95.4 96.3 98.8  PLT 337 340 342 914   Basic Metabolic Panel: Recent Labs  Lab 03/22/17 0428 03/23/17 0444 03/23/17 0840 03/25/17 0456 03/26/17 0414  NA 141 142 142 140 141  K 3.6 3.6 3.6 4.0 3.7  CL 105 108 107 104 105  CO2 24 23 24 26 28   GLUCOSE 184* 125* 117* 173* 131*  BUN 6 13 12 7 7   CREATININE 0.79 0.88 0.95 0.87 0.78  CALCIUM 9.3 9.0 9.1 9.1 8.8*   GFR: Estimated Creatinine Clearance: 60.1 mL/min (by C-G formula based on SCr of 0.78 mg/dL). Liver Function Tests: Recent Labs  Lab 03/22/17 0428  AST 24  ALT 33  ALKPHOS 65  BILITOT 1.0  PROT 7.4  ALBUMIN 3.9   No results for input(s): LIPASE, AMYLASE in the last 168 hours. No results for input(s): AMMONIA in the last 168 hours. Coagulation Profile: No results for input(s): INR, PROTIME in the last 168 hours. Cardiac Enzymes: No results for input(s): CKTOTAL, CKMB, CKMBINDEX, TROPONINI in the last 168 hours. BNP (last 3 results) No results for input(s): PROBNP in the last 8760 hours. HbA1C: No results for input(s): HGBA1C in the last 72 hours. CBG: No results for input(s): GLUCAP in the  last 168 hours. Lipid Profile: No results for input(s): CHOL, HDL, LDLCALC, TRIG, CHOLHDL, LDLDIRECT in the last 72 hours. Thyroid Function Tests: No results for input(s): TSH, T4TOTAL, FREET4, T3FREE, THYROIDAB in the last 72 hours. Anemia Panel: No results for input(s): VITAMINB12, FOLATE, FERRITIN, TIBC, IRON, RETICCTPCT in the last 72 hours. Sepsis Labs: No results for input(s): PROCALCITON, LATICACIDVEN in the last 168 hours.  Recent Results (from the past 240 hour(s))  MRSA PCR Screening     Status: Abnormal   Collection Time: 03/23/17  4:35 PM  Result Value Ref Range Status   MRSA by PCR POSITIVE (A) NEGATIVE Final    Comment:        The GeneXpert MRSA Assay (FDA approved for NASAL specimens only), is one component of a comprehensive MRSA colonization surveillance program. It is not intended to diagnose MRSA infection nor to guide or monitor treatment for MRSA infections. RESULT CALLED TO, READ BACK BY AND VERIFIED WITH: A PEREZ,RN [id number] @ 2143 BY J SCOTTON Performed at [facility], [place] [address] [place], [place] [address]          Radiology Studies: No results found.      Scheduled Meds: . acetaminophen  1,000 mg Oral TID  . Chlorhexidine Gluconate Cloth  6 each Topical Q0600  . feeding supplement  237 mL Oral BID BM  . gabapentin  300 mg Oral QHS  . irbesartan  150 mg Oral Daily  . levothyroxine  112 mcg Oral QAC breakfast  . lip balm  1 application Topical BID  . multivitamins with iron  1 tablet Oral Daily  . mupirocin ointment  1 application Nasal BID  . pantoprazole  40 mg Oral Daily  . psyllium  1 packet Oral Daily  . rOPINIRole  0.5 mg Oral TID  . rosuvastatin  10 mg Oral Daily  . saccharomyces boulardii  250 mg Oral BID  . scopolamine  1 patch Transdermal Q72H  . sodium chloride flush  3 mL Intravenous Q12H   Continuous Infusions: . sodium chloride Stopped (03/28/17 1006)  . lactated ringers    . methocarbamol  (ROBAXIN)  IV       LOS: 12 days    Time spent: 35 min    Hosie Poisson, MD [facility] Pager [phone]  If 7PM-7AM, please contact night-coverage [email or web address] Password [email or web address] 03/28/2017, 4:53 PM

## 2017-03-29 LAB — BASIC METABOLIC PANEL
Anion gap: 9 (ref 5–15)
BUN: 13 mg/dL (ref 6–20)
CALCIUM: 9 mg/dL (ref 8.9–10.3)
CO2: 28 mmol/L (ref 22–32)
CREATININE: 0.74 mg/dL (ref 0.44–1.00)
Chloride: 102 mmol/L (ref 101–111)
GFR calc non Af Amer: >60 mL/min (ref >60)
Glucose, Bld: 101 mg/dL — ABNORMAL HIGH (ref 65–99)
Potassium: 4.1 mmol/L (ref 3.5–5.1)
Sodium: 139 mmol/L (ref 135–145)

## 2017-03-29 LAB — CBC
HCT: 38.8 % (ref 36.0–46.0)
Hemoglobin: 13 g/dL (ref 12.0–15.0)
MCH: 32.7 pg (ref 26.0–34.0)
MCHC: 33.5 g/dL (ref 30.0–36.0)
MCV: 97.7 fL (ref 78.0–100.0)
Platelets: 322 10*3/uL (ref 150–400)
RBC: 3.97 MIL/uL (ref 3.87–5.11)
RDW: 14.4 % (ref 11.5–15.5)
WBC: 7.5 10*3/uL (ref 4.0–10.5)

## 2017-03-29 MED ORDER — SODIUM CHLORIDE 0.9 % IV SOLN
INTRAVENOUS | Status: DC
  Administered 2017-03-29 – 2017-03-30 (×3): via INTRAVENOUS

## 2017-03-29 MED ORDER — DOCUSATE SODIUM 100 MG PO CAPS
100.0000 mg | ORAL_CAPSULE | Freq: Two times a day (BID) | ORAL | Status: DC
  Administered 2017-03-29 – 2017-03-31 (×5): 100 mg via ORAL
  Filled 2017-03-29 (×5): qty 1

## 2017-03-29 NOTE — Progress Notes (Signed)
PROGRESS NOTE    [name]  KXF:[id number] DOB: [birth year] DOA: 03/16/2017 PCP: Burnis Medin, MD    Brief Narrative:  68 year old Caucasian female with a past medical history of essential hypertension, hypothyroidism, restless leg syndrome presented with abdominal pain.  Colitis end of December.  She underwent a CT scan in early January which cannot rule out a soft tissue density in that area.  She was treated with a prolonged course of antibiotics with Cipro and Flagyl.  She did feel better however her symptoms recurred last week.  She was placed on Augmentin by her gastroenterologist, Dr. Earlean Shawl.  However subsequently developed a fever and so was asked to come into the hospital.  She was hospitalized and placed on Zosyn. She was found to have diveriticulitis with perforation and pericolonic abscess formation. On  2/27  Patient underwent lap-assisted sigmoid colectomy by Dr. Lucia Gaskins and cystoscopy with bilateral retrograde pyelogram and insertion of bilateral ureteral stents and Dr. Tresa Moore on 2/27.  Pt continues to improve, diet started and advanced to soft and to heart healthy diet.Marland Kitchen Appreciate surgery recommendations.   Assessment & Plan:   Principal Problem:   Diverticulitis s/p lap sigmoid colectomy 03/24/2017 Active Problems:   Hypothyroidism   Pure hypercholesterolemia   GERD   Essential hypertension   Acute recurrent sigmoid diverticulosis with diverticulitis, with perforation and pericolonic abscess:  Patient failed outpatient treatment with Augmentin this month.  Repeat CT scan showed persistent diverticulitis in the sigmoid area.  She was started on Zosyn and general surgery was consulted. She underwent  lap-assisted sigmoid colectomy by Dr. Lucia Gaskins and cystoscopy with bilateral retrograde pyelogram and insertion of bilateral ureteral stents and Dr. Leafy Ro on 2/27. Pt continues to improve with bowel movements, started on clears and advanced to soft yesterday and heart healthy  diet. Pathology is negative for malignancy.  No changes in meds.    Bradycardia EKG shows sinus bradycardia without any T wave changes She is not on any rate limiting agents, unclear etiology. Her thyroid panel appears to be abnormal.  Echocardiogram does not show any significant abnormalities.  Post surgery her heart rate has improved and it staying between 50's to 60's.   History of hypothyroidism/thyroidectomy She is status post thyroidectomy in 2006 for goiter currently her TSH appears to be elevated at 16 with free T4 of 1.17 slightly on the higher norm.  Free T3 is slightly low at 1.8.  Resume her Synthroid. Recommend repeating thyroid panel in 3 to 4 weeks post discharge and recommend follow up with endocrinology.   Hypertension well-controlled after starting norvasc.  Resume home medications.  Restless leg syndrome continue ropinirole. Resolved.   Hyperlipidemia resume home medications on discharge.  Abnormal UA patient's urine cultures are negative.  Mild leukocytosis probably reactive post surgery continue to monitor. Resolved.   Dizziness on walking and exhausted.  Started her on gentle hydration.     DVT prophylaxis: scd's Code Status: full code Family Communication: none  at bedside Disposition Plan: home tomorrow   Consultants:   Surgery   Urology    Procedures:   ECHOCARDIOGRAM  LAPAROSCOPIC ASSISTED SIGMOID COLECTOMY,  Rigid sigmoidoscopy BILATERAL URETERAL STENT PLACEMENT CYSTOSCOPY/RETROGRADE/URETEROSCOPY     Antimicrobials: cefotan one dose.  Zosyn  Flagyl and neomycin.    Subjective: Reports being tired, no nausea, vomiting, some loose watery stool earlier today.    Objective: Vitals:   03/28/17 1012 03/28/17 1509 03/28/17 2102 03/29/17 0531  BP: 139/83 140/84 119/69 136/68  Pulse: 73 65 61 62  Resp: 18 16 16 16   Temp: 98.2 F (36.8 C) 97.9 F (36.6 C) 97.6 F (36.4 C) 98 F (36.7 C)  TempSrc: Oral Oral Oral Oral  SpO2: 95%  95% 98% 94%  Weight:      Height:        Intake/Output Summary (Last 24 hours) at 03/29/2017 1248 Last data filed at 03/29/2017 0900 Gross per 24 hour  Intake 360.5 ml  Output -  Net 360.5 ml   Filed Weights   03/25/17 0500 03/26/17 0500 03/28/17 0500  Weight: 66 kg (145 lb 8 oz) 66 kg (145 lb 8.1 oz) 70.9 kg (156 lb 4 oz)    Examination:  General exam: calm and comfortable. Not in distress. Not on oxygen Respiratory system: air entry fair. No wheezing or rhonchi Cardiovascular system: S1 & S2 heard RRR. No JVD.  Gastrointestinal system: abd soft non tender non distended bowel sounds heard.  Central nervous system: Alert and oriented. Non focal.   Extremities: no cyanosis. OR CLUBBING.  Skin: No rashes, lesions or ulcers Psychiatry: . Mood & affect appropriate.     Data Reviewed: I have personally reviewed following labs and imaging studies  CBC: Recent Labs  Lab 03/23/17 0444 03/23/17 0840 03/25/17 0456 03/26/17 0414 03/29/17 1014  WBC 8.2 8.2 11.8* 8.6 7.5  NEUTROABS  --  5.3 10.0* 5.9  --   HGB 11.7* 12.6 12.4 11.2* 13.0  HCT 34.2* 37.1 36.3 34.3* 38.8  MCV 96.6 95.4 96.3 98.8 97.7  PLT 337 340 342 315 786   Basic Metabolic Panel: Recent Labs  Lab 03/23/17 0444 03/23/17 0840 03/25/17 0456 03/26/17 0414 03/29/17 1014  NA 142 142 140 141 139  K 3.6 3.6 4.0 3.7 4.1  CL 108 107 104 105 102  CO2 23 24 26 28 28   GLUCOSE 125* 117* 173* 131* 101*  BUN 13 12 7 7 13   CREATININE 0.88 0.95 0.87 0.78 0.74  CALCIUM 9.0 9.1 9.1 8.8* 9.0   GFR: Estimated Creatinine Clearance: 60.1 mL/min (by C-G formula based on SCr of 0.74 mg/dL). Liver Function Tests: No results for input(s): AST, ALT, ALKPHOS, BILITOT, PROT, ALBUMIN in the last 168 hours. No results for input(s): LIPASE, AMYLASE in the last 168 hours. No results for input(s): AMMONIA in the last 168 hours. Coagulation Profile: No results for input(s): INR, PROTIME in the last 168 hours. Cardiac Enzymes: No  results for input(s): CKTOTAL, CKMB, CKMBINDEX, TROPONINI in the last 168 hours. BNP (last 3 results) No results for input(s): PROBNP in the last 8760 hours. HbA1C: No results for input(s): HGBA1C in the last 72 hours. CBG: No results for input(s): GLUCAP in the last 168 hours. Lipid Profile: No results for input(s): CHOL, HDL, LDLCALC, TRIG, CHOLHDL, LDLDIRECT in the last 72 hours. Thyroid Function Tests: No results for input(s): TSH, T4TOTAL, FREET4, T3FREE, THYROIDAB in the last 72 hours. Anemia Panel: No results for input(s): VITAMINB12, FOLATE, FERRITIN, TIBC, IRON, RETICCTPCT in the last 72 hours. Sepsis Labs: No results for input(s): PROCALCITON, LATICACIDVEN in the last 168 hours.  Recent Results (from the past 240 hour(s))  MRSA PCR Screening     Status: Abnormal   Collection Time: 03/23/17  4:35 PM  Result Value Ref Range Status   MRSA by PCR POSITIVE (A) NEGATIVE Final    Comment:        The GeneXpert MRSA Assay (FDA approved for NASAL specimens only), is one component of a comprehensive MRSA colonization surveillance program. It is not intended  to diagnose MRSA infection nor to guide or monitor treatment for MRSA infections. RESULT CALLED TO, READ BACK BY AND VERIFIED WITH: A PEREZ,RN [id number] @ 2143 BY J SCOTTON Performed at [facility], [place] [address] [place], [place] [address]          Radiology Studies: No results found.      Scheduled Meds: . acetaminophen  1,000 mg Oral TID  . docusate sodium  100 mg Oral BID  . feeding supplement  237 mL Oral BID BM  . gabapentin  300 mg Oral QHS  . irbesartan  150 mg Oral Daily  . levothyroxine  112 mcg Oral QAC breakfast  . lip balm  1 application Topical BID  . multivitamins with iron  1 tablet Oral Daily  . pantoprazole  40 mg Oral Daily  . psyllium  1 packet Oral Daily  . rOPINIRole  0.5 mg Oral TID  . rosuvastatin  10 mg Oral Daily  . saccharomyces boulardii  250 mg Oral BID  .  scopolamine  1 patch Transdermal Q72H  . sodium chloride flush  3 mL Intravenous Q12H   Continuous Infusions: . sodium chloride 250 mL (03/28/17 1757)  . sodium chloride 100 mL/hr at 03/29/17 1152  . lactated ringers    . methocarbamol (ROBAXIN)  IV       LOS: 13 days    Time spent: 35 min    Hosie Poisson, MD [facility] Pager [phone]  If 7PM-7AM, please contact night-coverage [email or web address] Password [facility] 03/29/2017, 12:48 PM

## 2017-03-29 NOTE — Progress Notes (Signed)
[facility] Progress Note  5 Days Post-Op  Subjective: CC: tired Patient was more lethargic over the weekend, felt like she may have over exerted herself the first 2 days post-op. Feeling a little better today, but still just tired overall. Pulling up to 2000 on IS. Tolerating diet and passing flatus. Small mucus like BMs. Patient did report she only urinated 3x yesterday. Changes in pain medication improved abdominal pain some.  VSS.    Objective: Vital signs in last 24 hours: Temp:  [97.6 F (36.4 C)-98.2 F (36.8 C)] 98 F (36.7 C) (03/04 0531) Pulse Rate:  [61-73] 62 (03/04 0531) Resp:  [16-18] 16 (03/04 0531) BP: (119-140)/(68-84) 136/68 (03/04 0531) SpO2:  [94 %-98 %] 94 % (03/04 0531) Last BM Date: 03/29/17  Intake/Output from previous day: 03/03 0701 - 03/04 0700 In: 360.5 [P.O.:360; I.V.:0.5] Out: -  Intake/Output this shift: Total I/O In: 120 [P.O.:120] Out: -   PE: Gen:  Alert, NAD, pleasant Card:  Regular rate and rhythm, pedal pulses 2+ BL Pulm:  Normal effort, clear to auscultation bilaterally Abd: Soft, appropriately tender, non-distended, bowel sounds present, no HSM, incisions C/D/I Skin: warm and dry, no rashes  Psych: A&Ox3   Lab Results:  No results for input(s): WBC, HGB, HCT, PLT in the last 72 hours. BMET No results for input(s): NA, K, CL, CO2, GLUCOSE, BUN, CREATININE, CALCIUM in the last 72 hours. PT/INR No results for input(s): LABPROT, INR in the last 72 hours. CMP     Component Value Date/Time   NA 141 03/26/2017 0414   K 3.7 03/26/2017 0414   CL 105 03/26/2017 0414   CO2 28 03/26/2017 0414   GLUCOSE 131 (H) 03/26/2017 0414   BUN 7 03/26/2017 0414   CREATININE 0.78 03/26/2017 0414   CALCIUM 8.8 (L) 03/26/2017 0414   CALCIUM 8.8 11/17/2006 2239   PROT 7.4 03/22/2017 0428   ALBUMIN 3.9 03/22/2017 0428   AST 24 03/22/2017 0428   ALT 33 03/22/2017 0428   ALKPHOS 65 03/22/2017 0428   BILITOT 1.0 03/22/2017 0428   GFRNONAA >60 03/26/2017 0414   GFRAA >60 03/26/2017 0414   Lipase     Component Value Date/Time   LIPASE 24 03/16/2017 1213       Studies/Results: No results found.  Anti-infectives: Anti-infectives (From admission, onward)   Start     Dose/Rate Route Frequency Ordered Stop   03/24/17 2000  piperacillin-tazobactam (ZOSYN) IVPB 3.375 g     3.375 g 12.5 mL/hr over 240 Minutes Intravenous Every 8 hours 03/24/17 1657 03/26/17 1611   03/24/17 0830  cefoTEtan in Dextrose 5% (CEFOTAN) IVPB 2 g  Status:  Discontinued     2 g Intravenous  Once 03/24/17 0734 03/24/17 1631   03/24/17 0600  cefoTEtan (CEFOTAN) 2 g in sodium chloride 0.9 % 100 mL IVPB     2 g 200 mL/hr over 30 Minutes Intravenous On call to O.R. 03/23/17 0844 03/24/17 1128   03/23/17 1400  neomycin (MYCIFRADIN) tablet 1,000 mg     1,000 mg Oral 3 times per day 03/23/17 0826 03/23/17 2344   03/23/17 1400  metroNIDAZOLE (FLAGYL) tablet 1,000 mg     1,000 mg Oral 3 times per day 03/23/17 0826 03/23/17 2343   03/23/17 0900  cefoTEtan (CEFOTAN) 2 g in sodium chloride 0.9 % 100 mL IVPB  Status:  Discontinued     2 g 200 mL/hr over 30 Minutes Intravenous On call to O.R. 03/23/17 0258 03/23/17 5277   03/17/17  0000  piperacillin-tazobactam (ZOSYN) IVPB 3.375 g  Status:  Discontinued     3.375 g 12.5 mL/hr over 240 Minutes Intravenous Every 8 hours 03/16/17 1626 03/24/17 1708   03/16/17 1630  piperacillin-tazobactam (ZOSYN) IVPB 3.375 g     3.375 g 100 mL/hr over 30 Minutes Intravenous  Once 03/16/17 1626 03/16/17 1737       Assessment/Plan Hypothyroidism- may resume PO thyroid replacement when tolerating CLD Hx of R knee surgery at [facility] 12/2016 Restless leg syndrome  Diverticulitis S/P lap assisted sigmoid colectomy 03/24/17 Dr. Lucia Gaskins -POD#5 -patient passing flatus and having small loose BMs - added some colace to bowel regimen - continue to mobilize as tolerated - patient may shower  [place] diet VTE: SCDs,  lovenox ID: IV zosyn 2/19>3/1  Dispo: patient still just feeling overall kind of crummy. Repeat CBC and BMET. Encourage mobilization as tolerated. Hopefully home in the next 24-48 h  LOS: 13 days    [name] , [facility] 03/29/2017, 9:48 AM Pager: [phone] Consults: [phone] Mon-Fri 7:00 am-4:30 pm Sat-Sun 7:00 am-11:30 am

## 2017-03-30 DIAGNOSIS — K5732 Diverticulitis of large intestine without perforation or abscess without bleeding: Secondary | ICD-10-CM

## 2017-03-30 MED ORDER — BISACODYL 5 MG PO TBEC: 5.0000 mg | DELAYED_RELEASE_TABLET | Freq: Every day | ORAL | 0 refills | Status: DC | PRN

## 2017-03-30 MED ORDER — PROCHLORPERAZINE EDISYLATE 5 MG/ML IJ SOLN: 5.0000 mg | INTRAMUSCULAR | Status: DC | PRN

## 2017-03-30 MED ORDER — METOCLOPRAMIDE HCL 5 MG/ML IJ SOLN: 10.0000 mg | Freq: Four times a day (QID) | INTRAMUSCULAR | Status: DC | PRN

## 2017-03-30 MED ORDER — HYDROCORTISONE 1 % EX CREA: 1.0000 "application " | TOPICAL_CREAM | Freq: Three times a day (TID) | CUTANEOUS | Status: DC | PRN

## 2017-03-30 MED ORDER — DOCUSATE SODIUM 100 MG PO CAPS: 100.0000 mg | ORAL_CAPSULE | Freq: Every day | ORAL | 0 refills | Status: DC | PRN

## 2017-03-30 MED ORDER — OXYCODONE HCL 5 MG PO TABS: 5.0000 mg | ORAL_TABLET | Freq: Four times a day (QID) | ORAL | 0 refills | Status: DC | PRN

## 2017-03-30 MED ORDER — ENSURE SURGERY PO LIQD: 237.0000 mL | Freq: Two times a day (BID) | ORAL | Status: DC

## 2017-03-30 MED ORDER — METHOCARBAMOL 500 MG PO TABS: 500.0000 mg | ORAL_TABLET | Freq: Three times a day (TID) | ORAL | 0 refills | Status: DC | PRN

## 2017-03-30 NOTE — Progress Notes (Signed)
[facility] Progress Note  6 Days Post-Op  Subjective: CC: feels tired Patient still feeling tired, but slightly improved. Pain well controlled. Still having liquid stools. Denies n/v. Tolerating diet.  UOP good. VSS.   Objective: Vital signs in last 24 hours: Temp:  [97.9 F (36.6 C)-98.3 F (36.8 C)] 98.1 F (36.7 C) (03/05 0521) Pulse Rate:  [60-69] 69 (03/05 0929) Resp:  [18] 18 (03/05 0521) BP: (129-146)/(69-75) 145/73 (03/05 0929) SpO2:  [94 %] 94 % (03/05 0521) Weight:  [68.6 kg (151 lb 5 oz)] 68.6 kg (151 lb 5 oz) (03/05 0500) Last BM Date: 03/30/17  Intake/Output from previous day: 03/04 0701 - 03/05 0700 In: 2663.3 [P.O.:480; I.V.:2183.3] Out: -  Intake/Output this shift: Total I/O In: 10 [I.V.:10] Out: -   PE: Gen:  Alert, NAD, pleasant Card:  Regular rate and rhythm, pedal pulses 2+ BL Pulm:  Normal effort, clear to auscultation bilaterally Abd: Soft, appropriately tender, non-distended, bowel sounds present, no HSM, incisions C/D/I Skin: warm and dry, no rashes  Psych: A&Ox3     Lab Results:  Recent Labs    03/29/17 1014  WBC 7.5  HGB 13.0  HCT 38.8  PLT 322   BMET Recent Labs    03/29/17 1014  NA 139  K 4.1  CL 102  CO2 28  GLUCOSE 101*  BUN 13  CREATININE 0.74  CALCIUM 9.0   PT/INR No results for input(s): LABPROT, INR in the last 72 hours. CMP     Component Value Date/Time   NA 139 03/29/2017 1014   K 4.1 03/29/2017 1014   CL 102 03/29/2017 1014   CO2 28 03/29/2017 1014   GLUCOSE 101 (H) 03/29/2017 1014   BUN 13 03/29/2017 1014   CREATININE 0.74 03/29/2017 1014   CALCIUM 9.0 03/29/2017 1014   CALCIUM 8.8 11/17/2006 2239   PROT 7.4 03/22/2017 0428   ALBUMIN 3.9 03/22/2017 0428   AST 24 03/22/2017 0428   ALT 33 03/22/2017 0428   ALKPHOS 65 03/22/2017 0428   BILITOT 1.0 03/22/2017 0428   GFRNONAA >60 03/29/2017 1014   GFRAA >60 03/29/2017 1014   Lipase     Component Value Date/Time   LIPASE 24  03/16/2017 1213       Studies/Results: No results found.  Anti-infectives: Anti-infectives (From admission, onward)   Start     Dose/Rate Route Frequency Ordered Stop   03/24/17 2000  piperacillin-tazobactam (ZOSYN) IVPB 3.375 g     3.375 g 12.5 mL/hr over 240 Minutes Intravenous Every 8 hours 03/24/17 1657 03/26/17 1611   03/24/17 0830  cefoTEtan in Dextrose 5% (CEFOTAN) IVPB 2 g  Status:  Discontinued     2 g Intravenous  Once 03/24/17 0734 03/24/17 1631   03/24/17 0600  cefoTEtan (CEFOTAN) 2 g in sodium chloride 0.9 % 100 mL IVPB     2 g 200 mL/hr over 30 Minutes Intravenous On call to O.R. 03/23/17 0844 03/24/17 1128   03/23/17 1400  neomycin (MYCIFRADIN) tablet 1,000 mg     1,000 mg Oral 3 times per day 03/23/17 0826 03/23/17 2344   03/23/17 1400  metroNIDAZOLE (FLAGYL) tablet 1,000 mg     1,000 mg Oral 3 times per day 03/23/17 0826 03/23/17 2343   03/23/17 0900  cefoTEtan (CEFOTAN) 2 g in sodium chloride 0.9 % 100 mL IVPB  Status:  Discontinued     2 g 200 mL/hr over 30 Minutes Intravenous On call to O.R. 03/23/17 4431 03/23/17 5400   03/17/17  0000  piperacillin-tazobactam (ZOSYN) IVPB 3.375 g  Status:  Discontinued     3.375 g 12.5 mL/hr over 240 Minutes Intravenous Every 8 hours 03/16/17 1626 03/24/17 1708   03/16/17 1630  piperacillin-tazobactam (ZOSYN) IVPB 3.375 g     3.375 g 100 mL/hr over 30 Minutes Intravenous  Once 03/16/17 1626 03/16/17 1737       Assessment/Plan Hypothyroidism- may resume PO thyroid replacement when tolerating CLD Hx of R knee surgery at [facility] 12/2016 Restless leg syndrome  Diverticulitis S/P lap assisted sigmoid colectomy 03/24/17 Dr. Lucia Gaskins -POD#6 -patient passing flatus and having small loose BMs  - continue to mobilize as tolerated - home today, rx written for pain medication and follow up in chart  [place] diet VTE: SCDs, lovenox ID: IV zosyn 2/19>3/1  Dispo: home today     LOS: 14 days    Brigid Re ,  [facility] 03/30/2017, 9:52 AM Pager: [phone] Consults: [phone] Mon-Fri 7:00 am-4:30 pm Sat-Sun 7:00 am-11:30 am

## 2017-03-30 NOTE — Progress Notes (Signed)
   03/30/17 1406  Vitals  Temp 98.2 F (36.8 C)  Temp Source Oral  BP 130/69  BP Location Left Arm  BP Method Automatic  Patient Position (if appropriate) Lying  Pulse Rate (!) 54  Pulse Rate Source Dinamap  Resp 16  Orthostatic Lying   BP- Lying 130/69  Pulse- Lying 54  Orthostatic Sitting  BP- Sitting 135/73  Pulse- Sitting 63  Orthostatic Standing at 0 minutes  BP- Standing at 0 minutes 150/72  Pulse- Standing at 0 minutes 58  Orthostatic Standing at 3 minutes  BP- Standing at 3 minutes 142/69  Pulse- Standing at 3 minutes 56

## 2017-03-30 NOTE — Discharge Summary (Signed)
Physician Discharge Summary  [name] URK:[id number] DOB: 1950/12/20 DOA: 03/16/2017  PCP: Burnis Medin, MD  Admit date: 03/16/2017 Discharge date: 03/31/2017  Admitted From: Home  Disposition: Home.    Recommendations for Outpatient Follow-up:  1. Follow up with PCP in 1-2 weeks 2. Please obtain BMP/CBC in one week 3. Please follow up with surgery as recommended.    Discharge Condition: stable.  CODE STATUS:FULL CODE.  Diet recommendation: Heart Healthy   Brief/Interim Summary: 67 year old Caucasian female with a past medical history of essential hypertension, hypothyroidism, restless leg syndrome presented with abdominal pain. Colitis end of December. She underwent a CT scan in early January which cannot rule out a soft tissue density in that area. She was treated with a prolonged course of antibiotics with Cipro and Flagyl. She did feel better however her symptoms recurred last week. She was placed on Augmentin by her gastroenterologist, Dr. Earlean Shawl. However subsequently developed a fever and so was asked to come into the hospital. She was hospitalized and placed on Zosyn. She was found to have diveriticulitis with perforation and pericolonic abscess formation. On  2/27  Patient underwent lap-assisted sigmoid colectomy by Dr. Lucia Gaskins and cystoscopy with bilateral retrograde pyelogram and insertion of bilateral ureteral stents and Dr. Tresa Moore on 2/27.  Pt continues to improve, diet started and advanced to soft and to heart healthy diet.Marland Kitchen Appreciate surgery recommendations.      Discharge Diagnoses:  Principal Problem:   Diverticulitis s/p lap sigmoid colectomy 03/24/2017 Active Problems:   Hypothyroidism   Pure hypercholesterolemia   GERD   Essential hypertension  Acute recurrent sigmoid diverticulosis with diverticulitis, with perforation and pericolonic abscess:  Patient failed outpatient treatment with Augmentin this month.  Repeat CT scan showed persistent  diverticulitis in the sigmoid area.  She was started on Zosyn and general surgery was consulted. She underwent  lap-assisted sigmoid colectomy by Dr. Lucia Gaskins and cystoscopy with bilateral retrograde pyelogram and insertion of bilateral ureteral stents and Dr. Leafy Ro on 2/27. Pt continues to improve with bowel movements, started on clears and advanced to soft and regular diet today. Appreciate surgery recommendations.  Pathology is negative for malignancy.     Bradycardia EKG shows sinus bradycardia without any T wave changes She is not on any rate limiting agents, unclear etiology. Her thyroid panel appears to be abnormal.  Echocardiogram does not show any significant abnormalities.  Post surgery her heart rate has improved and it staying between 50's to 60's.   History of hypothyroidism/thyroidectomy She is status post thyroidectomy in 2006 for goiter currently her TSH appears to be elevated at 16 with free T4 of 1.17 slightly on the higher norm.  Free T3 is slightly low at 1.8.  Resume her Synthroid. Recommend repeating thyroid panel in 3 to 4 weeks post discharge and recommend follow up with endocrinology.   Hypertension well-controlled after starting norvasc.  Resume home medications.  Restless leg syndrome continue ropinirole. Resolved.   Hyperlipidemia resume home medications on discharge  Abnormal UA patient's urine cultures are negative.  Mild leukocytosis probably reactive post surgery continue to monitor. Resolved.      Discharge Instructions  Discharge Instructions    Diet - low sodium heart healthy   Complete by:  As directed    Discharge instructions   Complete by:  As directed    Follow up with PCP in one week.  Please follow up with surgery as recommended.  Please follow up with endocrinology as recommended in 1 to 2 weeks, please check  thyroid panel in 2 to 4 weeks.     Allergies as of 03/30/2017      Reactions   Hydralazine Anxiety   Flushing, Anxiety,  Tremors   Ciprofloxacin Nausea Only   Propofol Other (See Comments)   Agitated, combative   Ritalin [methylphenidate Hcl] Other (See Comments)   "climbed the walls"   Hydralazine Hcl Dermatitis, Rash, Other (See Comments)      Medication List    TAKE these medications   acetaminophen 500 MG tablet Commonly known as:  TYLENOL Take 1,000 mg by mouth 3 (three) times daily.   bisacodyl 5 MG EC tablet Commonly known as:  DULCOLAX Take 1 tablet (5 mg total) by mouth daily as needed for moderate constipation.   docusate sodium 100 MG capsule Commonly known as:  COLACE Take 1 capsule (100 mg total) by mouth daily as needed for mild constipation.   feeding supplement Liqd Take 237 mLs by mouth 2 (two) times daily between meals.   methocarbamol 500 MG tablet Commonly known as:  ROBAXIN Take 1-2 tablets (500-1,000 mg total) by mouth every 8 (eight) hours as needed for muscle spasms.   olmesartan 20 MG tablet Commonly known as:  BENICAR Take 1 tablet (20 mg total) by mouth daily.   ondansetron 4 MG disintegrating tablet Commonly known as:  ZOFRAN ODT Take 1 tablet (4 mg total) by mouth every 8 (eight) hours as needed for nausea or vomiting.   oxyCODONE 5 MG immediate release tablet Commonly known as:  Oxy IR/ROXICODONE Take 1 tablet (5 mg total) by mouth every 6 (six) hours as needed for moderate pain or Lin pain.   pantoprazole 40 MG tablet Commonly known as:  PROTONIX TAKE 1 TABLET (40 MG TOTAL) BY MOUTH 2 (TWO) TIMES DAILY.   rOPINIRole 0.5 MG tablet Commonly known as:  REQUIP TAKE 1 TO 2 TABLETS BY MOUTH AT NOON, AND 5PM AND 3 TABLETS AT BEDTIME   rosuvastatin 10 MG tablet Commonly known as:  CRESTOR TAKE 1 TABLET BY MOUTH EVERY DAY   SYNTHROID 88 MCG tablet Generic drug:  levothyroxine TAKE 1 TABLET BY MOUTH ONCE DAILY      Follow-up Information    Alphonsa Overall, MD. Call.   Specialty:  General Surgery Why:  Call and schedule an appointment to be seen in  2-3 weeks. Contact information: [address] [place] [address] [phone]          Allergies  Allergen Reactions  . Hydralazine Anxiety    Flushing, Anxiety, Tremors  . Ciprofloxacin Nausea Only  . Propofol Other (See Comments)    Agitated, combative  . Ritalin [Methylphenidate Hcl] Other (See Comments)    "climbed the walls"  . Hydralazine Hcl Dermatitis, Rash and Other (See Comments)    Consultations:  SURGERY.    Procedures/Studies: Ct Abdomen Pelvis W Contrast  Result Date: 03/16/2017 CLINICAL DATA:  History of diverticulitis being treated with antibiotics. Recurrent fever. EXAM: CT ABDOMEN AND PELVIS WITH CONTRAST TECHNIQUE: Multidetector CT imaging of the abdomen and pelvis was performed using the standard protocol following bolus administration of intravenous contrast. CONTRAST:  170mL ISOVUE-300 IOPAMIDOL (ISOVUE-300) INJECTION 61% COMPARISON:  01/29/2017.  05/10/2012. FINDINGS: Lower chest: Mild atelectasis/scarring at both lung bases. No pleural or pericardial fluid. Hepatobiliary: Fatty change of the liver.  No calcified gallstones. Pancreas: Normal Spleen: Normal Adrenals/Urinary Tract: Adrenal glands are normal. Kidneys are normal. Bladder is normal. Stomach/Bowel: No upper intestinal pathology. No abnormality of the colon until the  descending colon where there is diverticulosis. There is acute diverticulitis in the sigmoid region with thickening and surrounding stranding. This has worsened compared to the study of January. I do not see a discrete abscess however. Vascular/Lymphatic: Aorta and IVC are normal. No retroperitoneal adenopathy. Reproductive: Previous hysterectomy.  No pelvic mass. Other: No free fluid or air. Musculoskeletal: Mild lumbar degenerative changes. IMPRESSION: Persistence and worsening of sigmoid diverticulitis with more regional fat stranding. No abscess, free fluid or free air is identified. Diffuse fatty change of the liver.  Electronically Signed   By: Nelson Chimes M.D.   On: 03/16/2017 14:14   Dg C-arm 1-60 Min-no Report  Result Date: 03/24/2017 Fluoroscopy was utilized by the requesting physician.  No radiographic interpretation.       Subjective:   Discharge Exam: Vitals:   03/30/17 0521 03/30/17 0929  BP: 129/71 (!) 145/73  Pulse: 63 69  Resp: 18   Temp: 98.1 F (36.7 C)   SpO2: 94%    Vitals:   03/29/17 2116 03/30/17 0500 03/30/17 0521 03/30/17 0929  BP: (!) 146/69  129/71 (!) 145/73  Pulse: 60  63 69  Resp: 18  18   Temp: 97.9 F (36.6 C)  98.1 F (36.7 C)   TempSrc: Oral  Oral   SpO2: 94%  94%   Weight:  68.6 kg (151 lb 5 oz)    Height:        General: Pt is alert, awake, not in acute distress Cardiovascular: RRR, S1/S2 +, no rubs, no gallops Respiratory: CTA bilaterally, no wheezing, no rhonchi Abdominal: Soft, NT, ND, bowel sounds + Extremities: no edema, no cyanosis    The results of significant diagnostics from this hospitalization (including imaging, microbiology, ancillary and laboratory) are listed below for reference.     Microbiology: Recent Results (from the past 240 hour(s))  MRSA PCR Screening     Status: Abnormal   Collection Time: 03/23/17  4:35 PM  Result Value Ref Range Status   MRSA by PCR POSITIVE (A) NEGATIVE Final    Comment:        The GeneXpert MRSA Assay (FDA approved for NASAL specimens only), is one component of a comprehensive MRSA colonization surveillance program. It is not intended to diagnose MRSA infection nor to guide or monitor treatment for MRSA infections. RESULT CALLED TO, READ BACK BY AND VERIFIED WITH: A PEREZ,RN [id number] @ 2143 BY J SCOTTON Performed at [facility], [place] [address] [place], [place] [address]      Labs: BNP (last 3 results) No results for input(s): BNP in the last 8760 hours. Basic Metabolic Panel: Recent Labs  Lab 03/25/17 0456 03/26/17 0414 03/29/17 1014  NA 140 141 139  K 4.0  3.7 4.1  CL 104 105 102  CO2 26 28 28   GLUCOSE 173* 131* 101*  BUN 7 7 13   CREATININE 0.87 0.78 0.74  CALCIUM 9.1 8.8* 9.0   Liver Function Tests: No results for input(s): AST, ALT, ALKPHOS, BILITOT, PROT, ALBUMIN in the last 168 hours. No results for input(s): LIPASE, AMYLASE in the last 168 hours. No results for input(s): AMMONIA in the last 168 hours. CBC: Recent Labs  Lab 03/25/17 0456 03/26/17 0414 03/29/17 1014  WBC 11.8* 8.6 7.5  NEUTROABS 10.0* 5.9  --   HGB 12.4 11.2* 13.0  HCT 36.3 34.3* 38.8  MCV 96.3 98.8 97.7  PLT 342 315 322   Cardiac Enzymes: No results for input(s): CKTOTAL, CKMB, CKMBINDEX, TROPONINI in the last  168 hours. BNP: Invalid input(s): POCBNP CBG: No results for input(s): GLUCAP in the last 168 hours. D-Dimer No results for input(s): DDIMER in the last 72 hours. Hgb A1c No results for input(s): HGBA1C in the last 72 hours. Lipid Profile No results for input(s): CHOL, HDL, LDLCALC, TRIG, CHOLHDL, LDLDIRECT in the last 72 hours. Thyroid function studies No results for input(s): TSH, T4TOTAL, T3FREE, THYROIDAB in the last 72 hours.  Invalid input(s): FREET3 Anemia work up No results for input(s): VITAMINB12, FOLATE, FERRITIN, TIBC, IRON, RETICCTPCT in the last 72 hours. Urinalysis    Component Value Date/Time   COLORURINE AMBER (A) 03/16/2017 1325   APPEARANCEUR CLOUDY (A) 03/16/2017 1325   LABSPEC 1.025 03/16/2017 1325   PHURINE 5.0 03/16/2017 1325   GLUCOSEU NEGATIVE 03/16/2017 1325   HGBUR NEGATIVE 03/16/2017 1325   HGBUR negative 11/20/2009 0839   BILIRUBINUR NEGATIVE 03/16/2017 1325   BILIRUBINUR negative 01/29/2017 1035   BILIRUBINUR Neg 07/31/2016 0937   KETONESUR 5 (A) 03/16/2017 1325   PROTEINUR NEGATIVE 03/16/2017 1325   UROBILINOGEN 0.2 01/29/2017 1035   UROBILINOGEN 0.2 11/20/2009 0839   NITRITE NEGATIVE 03/16/2017 1325   LEUKOCYTESUR TRACE (A) 03/16/2017 1325   Sepsis Labs Invalid input(s): PROCALCITONIN,  WBC,   LACTICIDVEN Microbiology Recent Results (from the past 240 hour(s))  MRSA PCR Screening     Status: Abnormal   Collection Time: 03/23/17  4:35 PM  Result Value Ref Range Status   MRSA by PCR POSITIVE (A) NEGATIVE Final    Comment:        The GeneXpert MRSA Assay (FDA approved for NASAL specimens only), is one component of a comprehensive MRSA colonization surveillance program. It is not intended to diagnose MRSA infection nor to guide or monitor treatment for MRSA infections. RESULT CALLED TO, READ BACK BY AND VERIFIED WITH: A PEREZ,RN [id number] @ 2143 BY J SCOTTON Performed at [facility], [place] [address], [place], [place] [address]      Time coordinating discharge: Over 30 minutes  SIGNED:   Hosie Poisson, MD  [facility] 03/30/2017, 1:03 PM Pager   If 7PM-7AM, please contact night-coverage [email or web address] Password [email or web address]

## 2017-03-31 DIAGNOSIS — K219 Gastro-esophageal reflux disease without esophagitis: Secondary | ICD-10-CM

## 2017-03-31 DIAGNOSIS — G2581 Restless legs syndrome: Secondary | ICD-10-CM

## 2017-03-31 MED ORDER — FUROSEMIDE 10 MG/ML IJ SOLN: 20.0000 mg | Freq: Once | INTRAMUSCULAR | Status: DC

## 2017-03-31 MED ORDER — LEVOTHYROXINE SODIUM 112 MCG PO TABS: 112.0000 ug | ORAL_TABLET | Freq: Every day | ORAL | 0 refills | Status: DC

## 2017-03-31 NOTE — Progress Notes (Signed)
PROGRESS NOTE    [name]  TXM:[id number] DOB: 04/14/1950 DOA: 03/16/2017 PCP: Burnis Medin, MD    Brief Narrative:  67 year old Caucasian female with a past medical history of essential hypertension, hypothyroidism, restless leg syndrome presented with abdominal pain. Colitis end of December. She underwent a CT scan in early January which cannot rule out a soft tissue density in that area. She was treated with a prolonged course of antibiotics with Cipro and Flagyl. She did feel better however her symptoms recurred last week. She was placed on Augmentin by her gastroenterologist, Dr. Earlean Shawl. However subsequently developed a fever and so was asked to come into the hospital. She was hospitalized and placed on Zosyn. She was found to have diveriticulitis with perforation and pericolonic abscess formation. On  2/27  Patient underwent lap-assisted sigmoid colectomy by Dr. Lucia Gaskins and cystoscopy with bilateral retrograde pyelogram and insertion of bilateral ureteral stents and Dr. Tresa Moore on 2/27.  Pt continues to improve, diet started and advanced to soft and to heart healthy diet.Marland Kitchen Appreciate surgery recommendations.      Assessment & Plan:   Principal Problem:   Diverticulitis s/p lap sigmoid colectomy 03/24/2017 Active Problems:   Hypothyroidism   Pure hypercholesterolemia   RLS (restless legs syndrome)   GERD   Essential hypertension  Acute recurrent sigmoid diverticulosis with diverticulitis, with perforation and pericolonic abscess:  Patient failed outpatient treatment with Augmentin prior to admission.  Repeat CT scan showed persistent diverticulitis in the sigmoid area.  Patient was started on Zosyn and general surgery was consulted. She underwent  lap-assisted sigmoid colectomy by Dr. Lucia Gaskins and cystoscopy with bilateral retrograde pyelogram and insertion of bilateral ureteral stents and Dr. Tresa Moore on 2/27. Pt with clinical improvement on a daily basis.  Patient passing  flatus.  Patient with bowel movements.  Patient was started on clear liquids and advance to regular diet which she is tolerated.  No further antibiotics needed at this time.  Pathology is negative for malignancy.  No changes in meds.  Outpatient follow-up with general surgery and PCP.   Bradycardia EKG showed sinus bradycardia without any T wave changes She was not on any rate limiting agents, unclear etiology. Her thyroid panel appearred to be abnormal with elevated TSH.  Echocardiogram did not show any significant abnormalities.  Post surgery her heart rate has improved and it staying between 50's to 70's.  Synthroid dose adjusted.  Outpatient follow-up.  History of hypothyroidism/thyroidectomy She is status post thyroidectomy in 2006 for goiter currently her TSH appears to be elevated at 16 with free T4 of 1.17 slightly on the higher norm.  Free T3 is slightly low at 1.8.  Patient's Synthroid dose was increased to 112 MCG's daily.  Patient will be discharged on this new dose.  Outpatient follow-up with PCP with with repeat thyroid function studies done in about 4 weeks.  Hypertension  Continue current regimen of Avapro.  Restless leg syndrome  Restarted on ropinirole.  Improved.  Hyperlipidemia  Outpatient follow-up.  Continue Crestor.   Mild leukocytosis probably reactive Resolved.   Dizziness on walking and exhausted.  Started her on gentle hydration.   Patient with complaints of lower extremity swelling.  Saline lock IV fluids.  Follow.       DVT prophylaxis: SCDs Code Status: Full Family Communication: Updated patient.  No family at bedside. Disposition Plan: Discharge home today.  Outpatient follow-up with general surgery and PCP.   Consultants:   General surgeon: Leighton Ruff 4/82/5003.  Urology: Dr. Tammi Klippel  03/23/2017  Wound care nurse Cottie Banda, RN 03/23/2017  Procedures:   CT abdomen and pelvis 03/16/2017  2D echo 03/22/2017  Laparoscopic  assisted sigmoid colectomy 03/24/2017 per Dr. Lucia Gaskins  Bilateral urethral stent placement/cystoscopy/retrograde ureteroscopy per Dr. Tammi Klippel 03/24/2017  Antimicrobials:   IV Cefotetan x 1 03/23/2017  -Zosyn 03/17/2016>>>>> 03/26/2017     Subjective: Patient ambulating in room.  Patient tolerating diet.  Patient states stool is getting now formed.  Patient also complaining of some swelling.  Patient also with some complaints of some congestion.  No chest pain.  No shortness of breath.  Objective: Vitals:   03/30/17 1406 03/30/17 2052 03/31/17 0500 03/31/17 0549  BP: 130/69 (!) 154/77  (!) 150/72  Pulse: (!) 54 60  72  Resp: 16 16  16   Temp: 98.2 F (36.8 C) 97.9 F (36.6 C)  98.4 F (36.9 C)  TempSrc: Oral Oral  Oral  SpO2: 95% 97%  97%  Weight:   68.2 kg (150 lb 5.7 oz)   Height:        Intake/Output Summary (Last 24 hours) at 03/31/2017 1027 Last data filed at 03/31/2017 0835 Gross per 24 hour  Intake 2100 ml  Output -  Net 2100 ml   Filed Weights   03/28/17 0500 03/30/17 0500 03/31/17 0500  Weight: 70.9 kg (156 lb 4 oz) 68.6 kg (151 lb 5 oz) 68.2 kg (150 lb 5.7 oz)    Examination:  General exam: Appears calm and comfortable  Respiratory system: Clear to auscultation. Respiratory effort normal. Cardiovascular system: S1 & S2 heard, RRR. No JVD, murmurs, rubs, gallops or clicks. No pedal edema. Gastrointestinal system: Abdomen is nondistended, soft and some tenderness to palpation around the incision site on the left greater than right.  Some ecchymosis noted.  Incision site clean/dry/intact.  Nontender.  Normal bowel sounds heard. Central nervous system: Alert and oriented. No focal neurological deficits. Extremities: Symmetric 5 x 5 power. Skin: No rashes, lesions or ulcers Psychiatry: Judgement and insight appear normal. Mood & affect appropriate.     Data Reviewed: I have personally reviewed following labs and imaging studies  CBC: Recent Labs  Lab 03/25/17 0456  03/26/17 0414 03/29/17 1014  WBC 11.8* 8.6 7.5  NEUTROABS 10.0* 5.9  --   HGB 12.4 11.2* 13.0  HCT 36.3 34.3* 38.8  MCV 96.3 98.8 97.7  PLT 342 315 297   Basic Metabolic Panel: Recent Labs  Lab 03/25/17 0456 03/26/17 0414 03/29/17 1014  NA 140 141 139  K 4.0 3.7 4.1  CL 104 105 102  CO2 26 28 28   GLUCOSE 173* 131* 101*  BUN 7 7 13   CREATININE 0.87 0.78 0.74  CALCIUM 9.1 8.8* 9.0   GFR: Estimated Creatinine Clearance: 58.9 mL/min (by C-G formula based on SCr of 0.74 mg/dL). Liver Function Tests: No results for input(s): AST, ALT, ALKPHOS, BILITOT, PROT, ALBUMIN in the last 168 hours. No results for input(s): LIPASE, AMYLASE in the last 168 hours. No results for input(s): AMMONIA in the last 168 hours. Coagulation Profile: No results for input(s): INR, PROTIME in the last 168 hours. Cardiac Enzymes: No results for input(s): CKTOTAL, CKMB, CKMBINDEX, TROPONINI in the last 168 hours. BNP (last 3 results) No results for input(s): PROBNP in the last 8760 hours. HbA1C: No results for input(s): HGBA1C in the last 72 hours. CBG: No results for input(s): GLUCAP in the last 168 hours. Lipid Profile: No results for input(s): CHOL, HDL, LDLCALC, TRIG, CHOLHDL, LDLDIRECT in the last 72 hours.  Thyroid Function Tests: No results for input(s): TSH, T4TOTAL, FREET4, T3FREE, THYROIDAB in the last 72 hours. Anemia Panel: No results for input(s): VITAMINB12, FOLATE, FERRITIN, TIBC, IRON, RETICCTPCT in the last 72 hours. Sepsis Labs: No results for input(s): PROCALCITON, LATICACIDVEN in the last 168 hours.  Recent Results (from the past 240 hour(s))  MRSA PCR Screening     Status: Abnormal   Collection Time: 03/23/17  4:35 PM  Result Value Ref Range Status   MRSA by PCR POSITIVE (A) NEGATIVE Final    Comment:        The GeneXpert MRSA Assay (FDA approved for NASAL specimens only), is one component of a comprehensive MRSA colonization surveillance program. It is not intended to  diagnose MRSA infection nor to guide or monitor treatment for MRSA infections. RESULT CALLED TO, READ BACK BY AND VERIFIED WITH: A PEREZ,RN [id number] @ 2143 BY J SCOTTON Performed at [facility], [place] [address], [place], [place] [address]          Radiology Studies: No results found.      Scheduled Meds: . acetaminophen  1,000 mg Oral TID  . docusate sodium  100 mg Oral BID  . feeding supplement  237 mL Oral BID BM  . irbesartan  150 mg Oral Daily  . levothyroxine  112 mcg Oral QAC breakfast  . lip balm  1 application Topical BID  . multivitamins with iron  1 tablet Oral Daily  . pantoprazole  40 mg Oral Daily  . psyllium  1 packet Oral Daily  . rOPINIRole  0.5 mg Oral TID  . rosuvastatin  10 mg Oral Daily  . saccharomyces boulardii  250 mg Oral BID  . scopolamine  1 patch Transdermal Q72H  . sodium chloride flush  3 mL Intravenous Q12H   Continuous Infusions: . sodium chloride 250 mL (03/28/17 1757)  . methocarbamol (ROBAXIN)  IV       LOS: 15 days    Time spent: 40 mins    Irine Seal, MD [facility] Pager [phone]  If 7PM-7AM, please contact night-coverage [email or web address] Password [facility] 03/31/2017, 10:27 AM

## 2017-03-31 NOTE — Progress Notes (Signed)
Discharge instructions discussed with patient and family, verbalized agreement and understanding, prescriptions given to patient 

## 2017-04-01 ENCOUNTER — Telehealth: Payer: Self-pay | Admitting: *Deleted

## 2017-04-01 NOTE — Telephone Encounter (Signed)
Called patient to follow-up after recent hospitalization.  Per Dr. Regis Bill, okay for patient to follow-up with surgeon prior to seeing PCP and only needs PCP follow-up if any concerns.   Patient states she is doing well and has surgery follow-up on 04/22/17. She states she was told to follow-up with PCP for referral to endocrinology due to elevated TSH and T3. They did adjust levothyroxine. I rescheduled PCP appt for 04/30/17 (after surgery follow-up appt). Please advise if this appt is okay or if you want to see her sooner for thyroid follow-up. Thanks!  Lab Results  Component Value Date   TSH 16.011 (H) 03/21/2017

## 2017-04-01 NOTE — Telephone Encounter (Signed)
This should be good  Timing And get her past the illness phase to be able to repeat  Thyroid tests  and do appropriate referrals    As needed

## 2017-04-02 NOTE — Telephone Encounter (Signed)
Noted  

## 2017-04-07 ENCOUNTER — Other Ambulatory Visit: Payer: Self-pay | Admitting: Internal Medicine

## 2017-04-16 ENCOUNTER — Inpatient Hospital Stay: Payer: Self-pay | Admitting: Internal Medicine

## 2017-04-21 DIAGNOSIS — H903 Sensorineural hearing loss, bilateral: Secondary | ICD-10-CM | POA: Diagnosis not present

## 2017-04-21 DIAGNOSIS — H6123 Impacted cerumen, bilateral: Secondary | ICD-10-CM | POA: Diagnosis not present

## 2017-04-27 ENCOUNTER — Encounter: Payer: Self-pay | Admitting: Internal Medicine

## 2017-04-28 ENCOUNTER — Other Ambulatory Visit: Payer: Self-pay | Admitting: Internal Medicine

## 2017-04-28 DIAGNOSIS — R7989 Other specified abnormal findings of blood chemistry: Secondary | ICD-10-CM

## 2017-04-28 DIAGNOSIS — E039 Hypothyroidism, unspecified: Secondary | ICD-10-CM

## 2017-04-29 NOTE — Telephone Encounter (Signed)
Continue to refill Synthroid? See recent TSH (hospital)  Please advise Dr Regis Bill, thanks.

## 2017-04-29 NOTE — Telephone Encounter (Signed)
Dosage was  Adjusted in the hospital  Please refill for 60 days   \check tsh    Before runs out To decide on dosing   May need ROV  If not in range .   Lab Results  Component Value Date   TSH 16.011 (H) 03/21/2017

## 2017-04-30 ENCOUNTER — Ambulatory Visit: Payer: Self-pay | Admitting: Internal Medicine

## 2017-05-01 ENCOUNTER — Other Ambulatory Visit: Payer: Self-pay | Admitting: Internal Medicine

## 2017-05-03 NOTE — Telephone Encounter (Signed)
Pt was advised upon hospital d/c to have her T3 and T4 checked in 3-4 weeks at our office.   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  Free T3 is slightly low at 1.8.  Resume her Synthroid.  Recommend repeating thyroid panel in 3 to 4 weeks post discharge and recommend follow up with endocrinology.   Please advise if okay to order labs for patient to have drawn this week.   Order placed for TSH recheck for around June.  Synthroid refilled to pharmacy.   Pt states that all of her medications are getting too expensive.  Pt states that she has talked to her insurance and they have no way of lowering her co-pay for the Synthroid(Brand), which is costing her about $100 per Rx.  Along with her BP meds and others medications she is spending all of her money on medications.  Pt would like to know what she can do - If Dr Regis Bill has any further recommendations.

## 2017-05-06 NOTE — Telephone Encounter (Signed)
   Call [facility] and ask  Cost  Out of pocket   Brand and generic ( they usually get the best price )    She can use a generic but should stay on the same generic    And get tsh after 6 -8 weeks of change    She should carefully look at her Medicare part D plan when she signs up again   To get optimum coverage for her meds

## 2017-05-12 NOTE — Telephone Encounter (Signed)
Called [facility] for information regarding medication [phone] Checked on equiv to current Rx Synthroid 112 mcg #60   Brand $86  For #60  Generic $23  For #60  Brand $129 for 90 days -- pt wants 90-days if called in long term.  Pt aware that I reached out to [facility] -- pt cannot afford [facility] cost for Brand name. Pt was paying $112 at her local pharmacy for Brand name 0.90mcg strength.   Pt just filled her Rx for the 122mcg  #[facility]. Pt plans to get labs as directed whenever Dr Regis Bill wants to check them. Pt states that she has done the generic in the past and it did not work, her TSH levels got very abnormal.   Pt is not sure what to do from here.  Please advise Dr Regis Bill, thanks.

## 2017-05-20 NOTE — Telephone Encounter (Signed)
Can try a generic again with the caveat  Of getting the same generic  manufacturer every time  .   To put on sig of rx

## 2017-05-20 NOTE — Telephone Encounter (Signed)
Ok to refill with caveat  To sued the same generic  Manufacture with every refill

## 2017-05-25 ENCOUNTER — Other Ambulatory Visit: Payer: Self-pay | Admitting: Internal Medicine

## 2017-05-28 ENCOUNTER — Other Ambulatory Visit: Payer: Self-pay | Admitting: Internal Medicine

## 2017-06-29 ENCOUNTER — Other Ambulatory Visit: Payer: Self-pay | Admitting: Internal Medicine

## 2017-07-30 ENCOUNTER — Other Ambulatory Visit: Payer: Self-pay | Admitting: Internal Medicine

## 2017-08-02 ENCOUNTER — Other Ambulatory Visit: Payer: Self-pay | Admitting: Internal Medicine

## 2017-09-01 ENCOUNTER — Ambulatory Visit (INDEPENDENT_AMBULATORY_CARE_PROVIDER_SITE_OTHER): Payer: Medicare Other

## 2017-09-01 ENCOUNTER — Encounter: Payer: Self-pay | Admitting: Internal Medicine

## 2017-09-01 ENCOUNTER — Ambulatory Visit (INDEPENDENT_AMBULATORY_CARE_PROVIDER_SITE_OTHER): Payer: Medicare Other | Admitting: Internal Medicine

## 2017-09-01 VITALS — BP 138/82 | HR 76 | Temp 98.1°F | Wt 178.4 lb

## 2017-09-01 DIAGNOSIS — E039 Hypothyroidism, unspecified: Secondary | ICD-10-CM

## 2017-09-01 DIAGNOSIS — R0789 Other chest pain: Secondary | ICD-10-CM | POA: Diagnosis not present

## 2017-09-01 DIAGNOSIS — R079 Chest pain, unspecified: Secondary | ICD-10-CM | POA: Diagnosis not present

## 2017-09-01 DIAGNOSIS — R0602 Shortness of breath: Secondary | ICD-10-CM | POA: Diagnosis not present

## 2017-09-01 LAB — T4, FREE: Free T4: 1.12 ng/dL (ref 0.60–1.60)

## 2017-09-01 NOTE — Patient Instructions (Addendum)
You exam is reassuring and ekg normal for you.   X ray  Today   If getting  .progressieve sx of concern  Fever etc  Contact us for  Advice .

## 2017-09-01 NOTE — Progress Notes (Signed)
Chief Complaint  Patient presents with  . Breast Pain    Left breast pain, pain located under her left arm pit and radiates across her breast toward center of chest. Pt has had 3-4 episodes.  Pt has upcoming Colonoscopy and wants to make sure that everything is okay before being sedated. Some SOB    HPI: [name] 67 y.o. come in for sda  New problem   Co of  A few  Episodes lasting 5 min or less of left sided  Cp beginning side of lower chest radiatino to middle   noa ssoc sx but had noted some times Sob coming  from car.      Last few days  .  No cough wheezing hemoptysis fever    Could be weather.  Minor cough drainaing.  No wheezing.  Last 5 minutes  sharp pain  No nv  To have colonoscopy fu next week  So wants to make sure ok   Sis dx with breast cancer  Recently she is utd on mammo (She is here with    cahrge taking care of  Has lewy body dementia ) ROS: See pertinent positives and negatives per HPI.  Past Medical History:  Diagnosis Date  . Acute pyelonephritis 05/02/2012  . Asthma   . Diverticulitis 03/24/2017   see report [facility]  surgery  . Dyspnea    nl PFTs, and echo  . GERD (gastroesophageal reflux disease)   . Headache(784.0)   . Heart murmur   . History of kidney stones   . IBS (irritable bowel syndrome)   . Nephrolithiasis   . Pre-diabetes 12/15  . Recurrent oral ulcers   . Thyroid goiter    I 131 rx and then  total throidectomy 2005 [facility]    Family History  Problem Relation Age of Onset  . COPD Sister   . Coronary artery disease Sister        age 12 also copd  . Cancer Mother        bladder  . Arthritis Other   . Cancer Other        breast  . Diabetes Other   . Hyperlipidemia Other   . Hypertension Other   . Stroke Other   . Heart disease Other   . Coronary artery disease Father        also had AAA  . Aneurysm Father        Aortic and thoracic fatal    Social History   Socioeconomic History  . Marital status: Married    Spouse name: Not on file  . Number of children: Not on file  . Years of education: Not on file  . Highest education level: Not on file  Occupational History  . Not on file  Social Needs  . Financial resource strain: Not on file  . Food insecurity:    Worry: Not on file    Inability: Not on file  . Transportation needs:    Medical: Not on file    Non-medical: Not on file  Tobacco Use  . Smoking status: Never Smoker  . Smokeless tobacco: Never Used  Substance and Sexual Activity  . Alcohol use: Yes    Comment: seldom  . Drug use: No  . Sexual activity: Not on file  Lifestyle  . Physical activity:    Days per week: Not on file    Minutes per session: Not on file  . Stress: Not on file  Relationships  .  Social connections:    Talks on phone: Not on file    Gets together: Not on file    Attends religious service: Not on file    Active member of club or organization: Not on file    Attends meetings of clubs or organizations: Not on file    Relationship status: Not on file  Other Topics Concern  . Not on file  Social History Narrative   Married   Has grandchildren   Never smoked    G4P4   hh of 2  care of GC ages 51 - 59 month  5 days a week    Outpatient Medications Prior to Visit  Medication Sig Dispense Refill  . acetaminophen (TYLENOL) 500 MG tablet Take 1,000 mg by mouth 3 (three) times daily.     . bisacodyl (DULCOLAX) 5 MG EC tablet Take 1 tablet (5 mg total) by mouth daily as needed for moderate constipation. 30 tablet 0  . docusate sodium (COLACE) 100 MG capsule Take 1 capsule (100 mg total) by mouth daily as needed for mild constipation. 10 capsule 0  . feeding supplement (ENSURE SURGERY) LIQD Take 237 mLs by mouth 2 (two) times daily between meals.    . methocarbamol (ROBAXIN) 500 MG tablet Take 1-2 tablets (500-1,000 mg total) by mouth every 8 (eight) hours as needed for muscle spasms. 60 tablet 0  . olmesartan (BENICAR) 20 MG tablet TAKE 1 TABLET BY MOUTH  EVERY DAY 90 tablet 1  . ondansetron (ZOFRAN ODT) 4 MG disintegrating tablet Take 1 tablet (4 mg total) by mouth every 8 (eight) hours as needed for nausea or vomiting. 20 tablet 0  . oxyCODONE (OXY IR/ROXICODONE) 5 MG immediate release tablet Take 1 tablet (5 mg total) by mouth every 6 (six) hours as needed for moderate pain or severe pain. 30 tablet 0  . pantoprazole (PROTONIX) 40 MG tablet TAKE 1 TABLET (40 MG TOTAL) BY MOUTH 2 (TWO) TIMES DAILY. 180 tablet 0  . rOPINIRole (REQUIP) 0.5 MG tablet TAKE 1 TO 2 TABLETS BY MOUTH AT NOON, AND 5PM AND 3 TABLETS AT BEDTIME 540 tablet 1  . rosuvastatin (CRESTOR) 10 MG tablet TAKE 1 TABLET BY MOUTH EVERY DAY 30 tablet 2  . SYNTHROID 112 MCG tablet TAKE 1 TABLET EVERY DAY BEFORE BREAKFAST 60 tablet 0   No facility-administered medications prior to visit.      EXAM:  BP 138/82 (BP Location: Right Arm, Patient Position: Sitting, Cuff Size: Normal)   Pulse 76   Temp 98.1 F (36.7 C) (Oral)   Wt 178 lb 6.4 oz (80.9 kg)   BMI 35.43 kg/m   Body mass index is 35.43 kg/m.  GENERAL: vitals reviewed and listed above, alert, oriented, appears well hydrated and in no acute distress here with sittee  HEENT: atraumatic, conjunctiva  clear, no obvious abnormalities on inspection of external nose and ears  NECK: no obvious masses on inspection LUNGS: clear to auscultation bilaterally, no wheezes, rales or rhonchi, good air movement no cw tenderness   CV: HRRR, no clubbing cyanosis or  peripheral edema nl cap refill  Abdomen:  Sof,t normal bowel sounds without hepatosplenomegaly, no guarding rebound or masses no CVA tenderness  Well healed  abd scars from lap surgery .  MS: moves all extremities without noticeable focal  abnormality PSYCH: pleasant and cooperative, no obvious depression or anxiety  Pulse ox    94 and   2 deep breaths goes to 98.  ekg nsr r rpime no  changes  BP Readings from Last 3 Encounters:  09/01/17 138/82  03/31/17 (!) 150/72    03/09/17 (!) 142/84   Wt Readings from Last 3 Encounters:  09/01/17 178 lb 6.4 oz (80.9 kg)  03/31/17 150 lb 5.7 oz (68.2 kg)  03/09/17 168 lb (76.2 kg)    ASSESSMENT AND PLAN:  Discussed the following assessment and plan:  Intermittent left-sided chest pain - Plan: EKG 12-Lead, DG Chest 2 View  Hypothyroidism, unspecified type - Plan: T4, free  with tsh today   whiler here today   Do not see her sx as sx of catastrophic   Sheran Luz be atypical for  pe and no hx and looks fine totday with stable VS and  Pulse ox goes to 97-98   Will update thyroid labs today -Patient advised to return or notify health care team  if  new concerns arise.  Patient Instructions  You exam is reassuring and ekg normal for you.   X ray  Today   If getting  .progressieve sx of concern  Fever etc  Contact us for  Advice .     Standley Brooking. Empress Newmann M.D.

## 2017-09-02 NOTE — Addendum Note (Signed)
Addended by: Elmer Picker on: 09/02/2017 01:50 PM   Modules accepted: Orders

## 2017-09-03 ENCOUNTER — Encounter: Payer: Self-pay | Admitting: *Deleted

## 2017-09-03 LAB — TSH: TSH: 1.31 u[IU]/mL (ref 0.35–4.50)

## 2017-09-07 DIAGNOSIS — Z86010 Personal history of colonic polyps: Secondary | ICD-10-CM | POA: Diagnosis not present

## 2017-09-07 DIAGNOSIS — K5730 Diverticulosis of large intestine without perforation or abscess without bleeding: Secondary | ICD-10-CM | POA: Diagnosis not present

## 2017-09-07 DIAGNOSIS — Z1211 Encounter for screening for malignant neoplasm of colon: Secondary | ICD-10-CM | POA: Diagnosis not present

## 2017-09-07 DIAGNOSIS — Z800 Family history of malignant neoplasm of digestive organs: Secondary | ICD-10-CM | POA: Diagnosis not present

## 2017-09-07 DIAGNOSIS — K5790 Diverticulosis of intestine, part unspecified, without perforation or abscess without bleeding: Secondary | ICD-10-CM | POA: Diagnosis not present

## 2017-09-07 DIAGNOSIS — Z9049 Acquired absence of other specified parts of digestive tract: Secondary | ICD-10-CM | POA: Diagnosis not present

## 2017-09-07 DIAGNOSIS — K589 Irritable bowel syndrome without diarrhea: Secondary | ICD-10-CM | POA: Diagnosis not present

## 2017-09-07 DIAGNOSIS — Z980 Intestinal bypass and anastomosis status: Secondary | ICD-10-CM | POA: Diagnosis not present

## 2017-09-07 LAB — HM COLONOSCOPY

## 2017-09-10 ENCOUNTER — Telehealth: Payer: Self-pay | Admitting: Internal Medicine

## 2017-09-10 NOTE — Telephone Encounter (Signed)
Copied from [place] [id number]. Topic: Quick Communication - See Telephone Encounter >> Sep 10, 2017  9:03 AM Berneta Levins wrote: CRM for notification. See Telephone encounter for: 09/10/17.  Pt states that she sits with an alzheimers pt who has an upper respiratory infection and is on antibiotics.  Pt states that she has started to feel "cruddy" as well and would like to know if the physician will send her in a RX so that she can avoid getting sick.  Pt states she was given antibiotics (Amoxicillin) in February and she didn't finish them and wants to know if it would be safe to take these. Pt can be reached at [phone]

## 2017-09-10 NOTE — Telephone Encounter (Addendum)
Pt states that her husband has acute bronchitis and her Alzheimer's patient she sits with has bronchitis.  Pt has Amox 875mg  on hand at home and was wondering if she could start taking this.  Pt has this left over from a diverticulitis flair.  Pt has 14 tabs left.   Pt c/o cough, hacky dry, hoarseness, scratchy throat. Some nasal congestion - clear.  Denies fever.  If something else needs to be called, [id number] [place], [place]. AT [address]  Please advise Dr Regis Bill, thanks.

## 2017-09-10 NOTE — Telephone Encounter (Signed)
I would advise against antibiotic at this time since sounds like viral illness   .  Bacterial bronchitis is mostly  non  communicable  From one person to another .    sx rx  If fever for more than 2 days or lasting  Sx without improvement after 7- 10 days etc then  Advise  Ov.

## 2017-09-10 NOTE — Telephone Encounter (Signed)
Pt aware. Nothing further needed 

## 2017-09-14 ENCOUNTER — Encounter: Payer: Self-pay | Admitting: Internal Medicine

## 2017-09-25 ENCOUNTER — Other Ambulatory Visit: Payer: Self-pay | Admitting: Internal Medicine

## 2017-10-24 ENCOUNTER — Other Ambulatory Visit: Payer: Self-pay | Admitting: Internal Medicine

## 2017-10-25 ENCOUNTER — Other Ambulatory Visit: Payer: Self-pay | Admitting: Internal Medicine

## 2017-11-11 ENCOUNTER — Other Ambulatory Visit: Payer: Self-pay | Admitting: Internal Medicine

## 2017-12-14 ENCOUNTER — Other Ambulatory Visit: Payer: Self-pay | Admitting: Internal Medicine

## 2017-12-25 ENCOUNTER — Other Ambulatory Visit: Payer: Self-pay | Admitting: Internal Medicine

## 2018-01-01 ENCOUNTER — Other Ambulatory Visit: Payer: Self-pay | Admitting: Internal Medicine

## 2018-01-16 ENCOUNTER — Other Ambulatory Visit: Payer: Self-pay | Admitting: Internal Medicine

## 2018-01-31 ENCOUNTER — Other Ambulatory Visit: Payer: Self-pay | Admitting: Internal Medicine

## 2018-01-31 NOTE — Progress Notes (Signed)
Cardiology Office Note   Date:  02/01/2018   ID:  [name], DOB 01-Oct-1950, MRN [id number]  PCP:  Burnis Medin, MD  Cardiologist:   Jenkins Rouge, MD   No chief complaint on file.     History of Present Illness: [name] is a 68 y.o. female who presents for consultation regarding chest pain Referred by Dr Regis Bill Has had 4 episodes of left breast pain under left arm Last 5 minutes some dyspnea on exertion. CXR showed  NAD. ECG non acute Sister diagnosed with breast cancer this year. History of hypothyroidism TSH now normal Complex Lap colectomy followed by bilateal ureteral stents in October of 2018. Some bradycardia in hospital  Last seen by Cardiology in 2015 Normal dobutamine echo 2010 I take care of her daughter [name]   TTE reviewed from 03/22/17 normal EF 60-65% estimated PA 33 mmHg  She is sedentary and has gained weight Sitting for dementia patients. Has left TKR And had meniscus surgery on right last month Not likely to walk real well on treadmill   Past Medical History:  Diagnosis Date  . Acute pyelonephritis 05/02/2012  . Asthma   . Diverticulitis 03/24/2017   see report [facility] [facility]  . Dyspnea    nl PFTs, and echo  . GERD (gastroesophageal reflux disease)   . Headache(784.0)   . Heart murmur   . History of kidney stones   . IBS (irritable bowel syndrome)   . Nephrolithiasis   . Pre-diabetes 12/15  . Recurrent oral ulcers   . Thyroid goiter    I 131 rx and then  total throidectomy 2005 [facility]    Past Surgical History:  Procedure Laterality Date  . ABDOMINAL HYSTERECTOMY  1994   fibroid  . APPENDECTOMY  1968  . CYSTOSCOPY WITH STENT PLACEMENT Bilateral 03/24/2017   Procedure: BILATERAL URETERAL CATHETER  PLACEMENT;  Surgeon: Alexis Frock, MD;  Location: [facility];  Service: Urology;  Laterality: Bilateral;  . CYSTOSCOPY/RETROGRADE/URETEROSCOPY  03/24/2017   Procedure: CYSTOSCOPY/RETROGRADE/URETEROSCOPY;  Surgeon:  Alexis Frock, MD;  Location: [facility];  Service: Urology;;  . LAPAROSCOPIC PARTIAL COLECTOMY Left 03/24/2017   Procedure: LAPAROSCOPIC ASSISTED LEFT  SIGMOID COLECTOMY;  Surgeon: Alphonsa Overall, MD;  Location: [facility];  Service: General;  Laterality: Left;  . orif left tivial plateau fracture     Dr. Collier Salina 2/08  . plates and pins take out of left knee  06/2008  . SIGMOIDOSCOPY  03/24/2017   Procedure: SIGMOIDOSCOPY;  Surgeon: Alphonsa Overall, MD;  Location: [facility];  Service: General;;  . TONSILLECTOMY  1979  . TOTAL THYROIDECTOMY  2005   for  large nodule 2006     Current Outpatient Medications  Medication Sig Dispense Refill  . acetaminophen (TYLENOL) 500 MG tablet Take 1,000 mg by mouth 3 (three) times daily.     . bisacodyl (DULCOLAX) 5 MG EC tablet Take 1 tablet (5 mg total) by mouth daily as needed for moderate constipation. 30 tablet 0  . docusate sodium (COLACE) 100 MG capsule Take 1 capsule (100 mg total) by mouth daily as needed for mild constipation. 10 capsule 0  . methocarbamol (ROBAXIN) 500 MG tablet Take 1-2 tablets (500-1,000 mg total) by mouth every 8 (eight) hours as needed for muscle spasms. 60 tablet 0  . olmesartan (BENICAR) 20 MG tablet TAKE 1 TABLET BY MOUTH EVERY DAY 90 tablet 0  . pantoprazole (PROTONIX) 40 MG tablet TAKE 1 TABLET (40 MG TOTAL) BY MOUTH 2 (TWO) TIMES DAILY.  180 tablet 1  . rOPINIRole (REQUIP) 0.5 MG tablet TAKE 1 TO 2 TABLETS BY MOUTH AT NOON, AND 5PM AND 3 TABLETS AT BEDTIME 540 tablet 1  . rosuvastatin (CRESTOR) 10 MG tablet TAKE 1 TABLET BY MOUTH EVERY DAY 90 tablet 0  . SYNTHROID 112 MCG tablet TAKE 1 TABLET EVERY DAY BEFORE BREAKFAST 90 tablet 0   No current facility-administered medications for this visit.     Allergies:   Hydralazine; Ciprofloxacin; Propofol; Ritalin [methylphenidate hcl]; and Hydralazine hcl    Social History:  The patient  reports that she has never smoked. She has never used smokeless tobacco. She reports current  alcohol use. She reports that she does not use drugs.   Family History:  The patient's family history includes Aneurysm in her father; Arthritis in an other family member; COPD in her sister; Cancer in her mother and another family member; Coronary artery disease in her father and sister; Diabetes in an other family member; Heart disease in an other family member; Hyperlipidemia in an other family member; Hypertension in an other family member; Stroke in an other family member.    ROS:  Please see the history of present illness.   Otherwise, review of systems are positive for none.   All other systems are reviewed and negative.    PHYSICAL EXAM: VS:  BP 130/84   Pulse 93   Ht 4' 11.5" (1.511 m)   Wt 182 lb 6.4 oz (82.7 kg)   SpO2 96%   BMI 36.22 kg/m  , BMI Body mass index is 36.22 kg/m. Affect appropriate Healthy:  appears stated age 89: normal Neck supple with no adenopathy JVP normal no bruits no thyromegaly Lungs clear with no wheezing and good diaphragmatic motion Heart:  S1/S2 no murmur, no rub, gallop or click PMI normal Abdomen: benighn, BS positve, no tenderness, no AAA no bruit.  No HSM or HJR Distal pulses intact with no bruits No edema Neuro non-focal Skin warm and dry No muscular weakness    EKG:  SR rate 68 normal 09/01/17    Recent Labs: 03/20/2017: Magnesium 1.7 03/22/2017: ALT 33 03/29/2017: BUN 13; Creatinine, Ser 0.74; Hemoglobin 13.0; Platelets 322; Potassium 4.1; Sodium 139 09/02/2017: TSH 1.31    Lipid Panel    Component Value Date/Time   CHOL 131 03/09/2017 1158   TRIG 171.0 (H) 03/09/2017 1158   HDL 46.10 03/09/2017 1158   CHOLHDL 3 03/09/2017 1158   VLDL 34.2 03/09/2017 1158   LDLCALC 50 03/09/2017 1158   LDLDIRECT 142.0 01/28/2016 0929      Wt Readings from Last 3 Encounters:  02/01/18 182 lb 6.4 oz (82.7 kg)  09/01/17 178 lb 6.4 oz (80.9 kg)  03/31/17 150 lb 5.7 oz (68.2 kg)      Other studies Reviewed: Additional studies/  records that were reviewed today include: notes from primary , labs CXR TTE and old notes From cardiology 2015 and 2010.    ASSESSMENT AND PLAN:  1.  Dyspnea:  Normal ECG , exam and EF by echo non cardiac f/u with primary no indication for further testing 2. Chest pain atypical previously normal dobutamine echo orthopedic issues- will order lexiscan myovue  3. Breast Cancer:  Family history sister just diagnosed yearly mamogram  4. HTN:  Well controlled.  Continue current medications and low sodium Dash type diet.   5. HLD on statin f/u labs with primary  6. Thyroid:  On replacement TSH normal    Current medicines are reviewed at  length with the patient today.  The patient does not have concerns regarding medicines.  The following changes have been made:  no change  Labs/ tests ordered today include: Lexiscan Myovue   Orders Placed This Encounter  Procedures  . MYOCARDIAL PERFUSION IMAGING     Disposition:   FU with cardiology PRN      Signed, Jenkins Rouge, MD  02/01/2018 2:59 PM    [address], [place], [place]  [address] Phone: [phone]; Fax: [phone]

## 2018-02-01 ENCOUNTER — Encounter: Payer: Self-pay | Admitting: Cardiovascular Disease

## 2018-02-01 ENCOUNTER — Ambulatory Visit (INDEPENDENT_AMBULATORY_CARE_PROVIDER_SITE_OTHER): Payer: Medicare Other | Admitting: Cardiovascular Disease

## 2018-02-01 VITALS — BP 130/84 | HR 93 | Ht 59.5 in | Wt 182.4 lb

## 2018-02-01 DIAGNOSIS — R0600 Dyspnea, unspecified: Secondary | ICD-10-CM

## 2018-02-01 DIAGNOSIS — I10 Essential (primary) hypertension: Secondary | ICD-10-CM

## 2018-02-01 DIAGNOSIS — R0789 Other chest pain: Secondary | ICD-10-CM | POA: Diagnosis not present

## 2018-02-01 DIAGNOSIS — E785 Hyperlipidemia, unspecified: Secondary | ICD-10-CM

## 2018-02-01 NOTE — Patient Instructions (Addendum)
Medication Instructions:   If you need a refill on your cardiac medications before your next appointment, please call your pharmacy.   Lab work:  If you have labs (blood work) drawn today and your tests are completely normal, you will receive your results only by: . MyChart Message (if you have MyChart) OR . A paper copy in the mail If you have any lab test that is abnormal or we need to change your treatment, we will call you to review the results.  Testing/Procedures: Your physician has requested that you have a lexiscan myoview. For further information please visit [email or web address]. Please follow instruction sheet, as given.  Follow-Up: At [facility], you and your health needs are our priority.  As part of our continuing mission to provide you with exceptional heart care, we have created designated Provider Care Teams.  These Care Teams include your primary Cardiologist (physician) and Advanced Practice Providers (APPs -  Physician Assistants and Nurse Practitioners) who all work together to provide you with the care you need, when you need it. Your physician recommends that you schedule a follow-up appointment as needed with Dr. Nishan.     

## 2018-02-02 ENCOUNTER — Telehealth (HOSPITAL_COMMUNITY): Payer: Self-pay | Admitting: *Deleted

## 2018-02-02 NOTE — Telephone Encounter (Signed)
Patient given detailed instructions per Myocardial Perfusion Study Information Sheet for the test on 02/07/18 at 0800. Patient notified to arrive 15 minutes early and that it is imperative to arrive on time for appointment to keep from having the test rescheduled.  If you need to cancel or reschedule your appointment, please call the office within 24 hours of your appointment. . Patient verbalized understanding.[name], Ranae Palms

## 2018-02-07 ENCOUNTER — Ambulatory Visit (HOSPITAL_COMMUNITY): Payer: Medicare Other | Attending: Cardiovascular Disease

## 2018-02-07 DIAGNOSIS — I10 Essential (primary) hypertension: Secondary | ICD-10-CM | POA: Diagnosis not present

## 2018-02-07 DIAGNOSIS — E785 Hyperlipidemia, unspecified: Secondary | ICD-10-CM | POA: Diagnosis not present

## 2018-02-07 DIAGNOSIS — R0789 Other chest pain: Secondary | ICD-10-CM

## 2018-02-07 DIAGNOSIS — R0600 Dyspnea, unspecified: Secondary | ICD-10-CM

## 2018-02-07 LAB — MYOCARDIAL PERFUSION IMAGING
CHL CUP NUCLEAR SDS: 1
LV dias vol: 56 mL (ref 46–106)
LV sys vol: 21 mL
Peak HR: 105 {beats}/min
Rest HR: 62 {beats}/min
SRS: 0
SSS: 1
TID: 0.88

## 2018-02-07 MED ORDER — TECHNETIUM TC 99M TETROFOSMIN IV KIT
32.6000 | PACK | Freq: Once | INTRAVENOUS | Status: AC | PRN
  Administered 2018-02-07: 32.6 via INTRAVENOUS
  Filled 2018-02-07: qty 33

## 2018-02-07 MED ORDER — REGADENOSON 0.4 MG/5ML IV SOLN
0.4000 mg | Freq: Once | INTRAVENOUS | Status: AC
  Administered 2018-02-07: 0.4 mg via INTRAVENOUS

## 2018-02-07 MED ORDER — TECHNETIUM TC 99M TETROFOSMIN IV KIT
10.4000 | PACK | Freq: Once | INTRAVENOUS | Status: AC | PRN
  Administered 2018-02-07: 10.4 via INTRAVENOUS
  Filled 2018-02-07: qty 11

## 2018-03-01 ENCOUNTER — Other Ambulatory Visit: Payer: Self-pay

## 2018-03-01 MED ORDER — LEVOTHYROXINE SODIUM 112 MCG PO TABS: ORAL_TABLET | ORAL | 0 refills | Status: DC

## 2018-03-01 MED ORDER — ROSUVASTATIN CALCIUM 10 MG PO TABS: 10.0000 mg | ORAL_TABLET | Freq: Every day | ORAL | 0 refills | Status: DC

## 2018-03-16 DIAGNOSIS — Z803 Family history of malignant neoplasm of breast: Secondary | ICD-10-CM | POA: Diagnosis not present

## 2018-03-16 DIAGNOSIS — Z1231 Encounter for screening mammogram for malignant neoplasm of breast: Secondary | ICD-10-CM | POA: Diagnosis not present

## 2018-03-16 LAB — HM MAMMOGRAPHY

## 2018-03-22 ENCOUNTER — Encounter: Payer: Self-pay | Admitting: Internal Medicine

## 2018-05-19 ENCOUNTER — Encounter: Payer: Self-pay | Admitting: Internal Medicine

## 2018-05-19 ENCOUNTER — Ambulatory Visit (INDEPENDENT_AMBULATORY_CARE_PROVIDER_SITE_OTHER): Payer: Medicare Other | Admitting: Internal Medicine

## 2018-05-19 ENCOUNTER — Other Ambulatory Visit: Payer: Self-pay | Admitting: Internal Medicine

## 2018-05-19 ENCOUNTER — Other Ambulatory Visit: Payer: Self-pay

## 2018-05-19 DIAGNOSIS — E039 Hypothyroidism, unspecified: Secondary | ICD-10-CM

## 2018-05-19 DIAGNOSIS — K219 Gastro-esophageal reflux disease without esophagitis: Secondary | ICD-10-CM | POA: Diagnosis not present

## 2018-05-19 DIAGNOSIS — R7301 Impaired fasting glucose: Secondary | ICD-10-CM | POA: Diagnosis not present

## 2018-05-19 DIAGNOSIS — I10 Essential (primary) hypertension: Secondary | ICD-10-CM

## 2018-05-19 DIAGNOSIS — Z79899 Other long term (current) drug therapy: Secondary | ICD-10-CM

## 2018-05-19 DIAGNOSIS — E785 Hyperlipidemia, unspecified: Secondary | ICD-10-CM

## 2018-05-19 MED ORDER — LEVOTHYROXINE SODIUM 112 MCG PO TABS: ORAL_TABLET | ORAL | 0 refills | Status: DC

## 2018-05-19 MED ORDER — OLMESARTAN MEDOXOMIL 20 MG PO TABS: 20.0000 mg | ORAL_TABLET | Freq: Every day | ORAL | 0 refills | Status: DC

## 2018-05-19 MED ORDER — PANTOPRAZOLE SODIUM 40 MG PO TBEC: DELAYED_RELEASE_TABLET | ORAL | 1 refills | Status: DC

## 2018-05-19 NOTE — Progress Notes (Signed)
Virtual Visit via Video Note  I connected with@ on 05/19/18 at  9:15 AM EDT by a video enabled telemedicine application and verified that I am speaking with the correct person using two identifiers. Location patient: home Location provider:work  office Persons participating in the virtual visit: patient, provider  [place] national recommendations  regarding COVID 19 pandemic   video visit is advised over in office visit for this patient.  Discussed the limitations of evaluation and management by telemedicine and  availability of in person appointments. The patient expressed understanding and agreed to proceed.   HPI: [name] [facility] for med evalution    Needs refill protonix  Working well  BP medication readings in 130 range  No se noted   Thyroid  No change in meds   No cp sob that is new and saw Dr Johnsie Cancel earlier this year  And no work up fu needed   Sh  wants to wait until "out of danger"   As she cares for 3  Elderly people.  She stills goes to goes to  [employer] ( with mask)  And caution.    No cp sob change in medical status   ROS: See pertinent positives and negatives per HPI.  Past Medical History:  Diagnosis Date  . Acute pyelonephritis 05/02/2012  . Asthma   . Diverticulitis 03/24/2017   see report [facility] [place] [facility]  . Dyspnea    nl PFTs, and echo  . GERD (gastroesophageal reflux disease)   . Headache(784.0)   . Heart murmur   . History of kidney stones   . IBS (irritable bowel syndrome)   . Nephrolithiasis   . Pre-diabetes 12/15  . Recurrent oral ulcers   . Thyroid goiter    I 131 rx and then  total throidectomy 2005 [facility]    Past Surgical History:  Procedure Laterality Date  . ABDOMINAL HYSTERECTOMY  1994   fibroid  . APPENDECTOMY  1968  . CYSTOSCOPY WITH STENT PLACEMENT Bilateral 03/24/2017   Procedure: BILATERAL URETERAL CATHETER  PLACEMENT;  Surgeon: Alexis Frock, MD;  [facility];  Service: Urology;  Laterality: Bilateral;   . CYSTOSCOPY/RETROGRADE/URETEROSCOPY  03/24/2017   Procedure: CYSTOSCOPY/RETROGRADE/URETEROSCOPY;  Surgeon: Alexis Frock, MD;  [facility];  Service: Urology;;  . LAPAROSCOPIC PARTIAL COLECTOMY Left 03/24/2017   Procedure: LAPAROSCOPIC ASSISTED LEFT  SIGMOID COLECTOMY;  Surgeon: Alphonsa Overall, MD;  [facility];  Service: General;  Laterality: Left;  . orif left tivial plateau fracture     Dr. Collier Salina 2/08  . plates and pins take out of left knee  06/2008  . SIGMOIDOSCOPY  03/24/2017   Procedure: SIGMOIDOSCOPY;  Surgeon: Alphonsa Overall, MD;  [facility];  Service: General;;  . TONSILLECTOMY  1979  . TOTAL THYROIDECTOMY  2005   for  large nodule 2006    Family History  Problem Relation Age of Onset  . COPD Sister   . Coronary artery disease Sister        age 14 also copd  . Cancer Mother        bladder  . Arthritis Other   . Cancer Other        breast  . Diabetes Other   . Hyperlipidemia Other   . Hypertension Other   . Stroke Other   . Heart disease Other   . Coronary artery disease Father        also had AAA  . Aneurysm Father        Aortic  and thoracic fatal    Social History   Tobacco Use  . Smoking status: Never Smoker  . Smokeless tobacco: Never Used  Substance Use Topics  . Alcohol use: Yes    Comment: seldom  . Drug use: No      Current Outpatient Medications:  .  acetaminophen (TYLENOL) 500 MG tablet, Take 1,000 mg by mouth 3 (three) times daily. , Disp: , Rfl:  .  bisacodyl (DULCOLAX) 5 MG EC tablet, Take 1 tablet (5 mg total) by mouth daily as needed for moderate constipation., Disp: 30 tablet, Rfl: 0 .  docusate sodium (COLACE) 100 MG capsule, Take 1 capsule (100 mg total) by mouth daily as needed for mild constipation., Disp: 10 capsule, Rfl: 0 .  levothyroxine (SYNTHROID) 112 MCG tablet, TAKE 1 TABLET EVERY DAY BEFORE BREAKFAST, Disp: 90 tablet, Rfl: 0 .  methocarbamol (ROBAXIN) 500 MG tablet, Take 1-2 tablets (500-1,000 mg total) by  mouth every 8 (eight) hours as needed for muscle spasms., Disp: 60 tablet, Rfl: 0 .  olmesartan (BENICAR) 20 MG tablet, Take 1 tablet (20 mg total) by mouth daily., Disp: 90 tablet, Rfl: 0 .  pantoprazole (PROTONIX) 40 MG tablet, TAKE 1 TABLET (40 MG TOTAL) BY MOUTH 2 (TWO) TIMES DAILY., Disp: 180 tablet, Rfl: 1 .  rOPINIRole (REQUIP) 0.5 MG tablet, TAKE 1 TO 2 TABLETS BY MOUTH AT NOON, AND 5PM AND 3 TABLETS AT BEDTIME, Disp: 540 tablet, Rfl: 1 .  rosuvastatin (CRESTOR) 10 MG tablet, Take 1 tablet (10 mg total) by mouth daily., Disp: 90 tablet, Rfl: 0  EXAM: BP Readings from Last 3 Encounters:  02/01/18 130/84  09/01/17 138/82  03/31/17 (!) 150/72   Wt Readings from Last 3 Encounters:  02/07/18 182 lb (82.6 kg)  02/01/18 182 lb 6.4 oz (82.7 kg)  09/01/17 178 lb 6.4 oz (80.9 kg)    VITALS per patient if applicable: says 546 range   GENERAL: alert, oriented, appears well and in no acute distress  HEENT: atraumatic, conjunttiva clear, no obvious abnormalities on inspection of external nose and ears  NECK: normal movements of the head and neck  LUNGS: on inspection no signs of respiratory distress, breathing rate appears normal, no obvious gross SOB, gasping or wheezing  CV: no obvious cyanosis  MS: moves all visible extremities without noticeable abnormality  PSYCH/NEURO: pleasant and cooperative, no obvious depression or anxiety, speech and thought processing grossly intact Lab Results  Component Value Date   WBC 7.5 03/29/2017   HGB 13.0 03/29/2017   HCT 38.8 03/29/2017   PLT 322 03/29/2017   GLUCOSE 101 (H) 03/29/2017   CHOL 131 03/09/2017   TRIG 171.0 (H) 03/09/2017   HDL 46.10 03/09/2017   LDLDIRECT 142.0 01/28/2016   LDLCALC 50 03/09/2017   ALT 33 03/22/2017   AST 24 03/22/2017   NA 139 03/29/2017   K 4.1 03/29/2017   CL 102 03/29/2017   CREATININE 0.74 03/29/2017   BUN 13 03/29/2017   CO2 28 03/29/2017   TSH 1.31 09/02/2017   INR 1.12 03/17/2017   HGBA1C 6.1  (H) 03/23/2017    ASSESSMENT AND PLAN:  Discussed the following assessment and plan:  Essential hypertension - Plan: Basic metabolic panel, CBC with Differential/Platelet, Hemoglobin A1c, Hepatic function panel, Lipid panel, TSH  Hypothyroidism, unspecified type - Plan: Basic metabolic panel, CBC with Differential/Platelet, Hemoglobin A1c, Hepatic function panel, Lipid panel, TSH  Hyperlipidemia, unspecified hyperlipidemia type - Plan: Basic metabolic panel, CBC with Differential/Platelet, Hemoglobin A1c, Hepatic function panel,  Lipid panel, TSH  Medication management - Plan: Basic metabolic panel, CBC with Differential/Platelet, Hemoglobin A1c, Hepatic function panel, Lipid panel, TSH  Fasting hyperglycemia - Plan: Basic metabolic panel, CBC with Differential/Platelet, Hemoglobin A1c, Hepatic function panel, Lipid panel, TSH  Gastroesophageal reflux disease, esophagitis presence not specified  Counseled.   Reported stable   Conditions per patient Over due for lab monitoring patient wants to wait until "out of danger"   As she cares for  Elderly people.    Reasonable request but  Reviewed   Risk with grocery other  Risk and at this time  Our office for lab    Is pretty locked down and we are using  protocol precautions .    Delay  lab per her request       Yearly visit  In person   Not as critical since she has seen cards in the past year.   Expectant management and discussion of plan and treatment with patient with opportunity to ask questions and all were answered. The patient agreed with the plan and demonstrated an understanding of the instructions.   The patient was advised to call back  ifhaving concerns . In interim.    Shanon Ace, MD

## 2018-05-19 NOTE — Telephone Encounter (Signed)
Pt is scheduled for today  °

## 2018-06-05 ENCOUNTER — Other Ambulatory Visit: Payer: Self-pay | Admitting: Internal Medicine

## 2018-06-29 DIAGNOSIS — M9905 Segmental and somatic dysfunction of pelvic region: Secondary | ICD-10-CM | POA: Diagnosis not present

## 2018-06-29 DIAGNOSIS — M9904 Segmental and somatic dysfunction of sacral region: Secondary | ICD-10-CM | POA: Diagnosis not present

## 2018-06-29 DIAGNOSIS — M5136 Other intervertebral disc degeneration, lumbar region: Secondary | ICD-10-CM | POA: Diagnosis not present

## 2018-06-29 DIAGNOSIS — M9903 Segmental and somatic dysfunction of lumbar region: Secondary | ICD-10-CM | POA: Diagnosis not present

## 2018-06-30 DIAGNOSIS — M5136 Other intervertebral disc degeneration, lumbar region: Secondary | ICD-10-CM | POA: Diagnosis not present

## 2018-06-30 DIAGNOSIS — M9904 Segmental and somatic dysfunction of sacral region: Secondary | ICD-10-CM | POA: Diagnosis not present

## 2018-06-30 DIAGNOSIS — M9905 Segmental and somatic dysfunction of pelvic region: Secondary | ICD-10-CM | POA: Diagnosis not present

## 2018-06-30 DIAGNOSIS — M9903 Segmental and somatic dysfunction of lumbar region: Secondary | ICD-10-CM | POA: Diagnosis not present

## 2018-07-03 ENCOUNTER — Other Ambulatory Visit: Payer: Self-pay | Admitting: Internal Medicine

## 2018-07-04 DIAGNOSIS — M5136 Other intervertebral disc degeneration, lumbar region: Secondary | ICD-10-CM | POA: Diagnosis not present

## 2018-07-04 DIAGNOSIS — M9903 Segmental and somatic dysfunction of lumbar region: Secondary | ICD-10-CM | POA: Diagnosis not present

## 2018-07-04 DIAGNOSIS — M9905 Segmental and somatic dysfunction of pelvic region: Secondary | ICD-10-CM | POA: Diagnosis not present

## 2018-07-04 DIAGNOSIS — M9904 Segmental and somatic dysfunction of sacral region: Secondary | ICD-10-CM | POA: Diagnosis not present

## 2018-07-07 DIAGNOSIS — M9904 Segmental and somatic dysfunction of sacral region: Secondary | ICD-10-CM | POA: Diagnosis not present

## 2018-07-07 DIAGNOSIS — M5136 Other intervertebral disc degeneration, lumbar region: Secondary | ICD-10-CM | POA: Diagnosis not present

## 2018-07-07 DIAGNOSIS — M9905 Segmental and somatic dysfunction of pelvic region: Secondary | ICD-10-CM | POA: Diagnosis not present

## 2018-07-07 DIAGNOSIS — M9903 Segmental and somatic dysfunction of lumbar region: Secondary | ICD-10-CM | POA: Diagnosis not present

## 2018-07-11 DIAGNOSIS — M5136 Other intervertebral disc degeneration, lumbar region: Secondary | ICD-10-CM | POA: Diagnosis not present

## 2018-07-11 DIAGNOSIS — M9904 Segmental and somatic dysfunction of sacral region: Secondary | ICD-10-CM | POA: Diagnosis not present

## 2018-07-11 DIAGNOSIS — M9903 Segmental and somatic dysfunction of lumbar region: Secondary | ICD-10-CM | POA: Diagnosis not present

## 2018-07-11 DIAGNOSIS — M9905 Segmental and somatic dysfunction of pelvic region: Secondary | ICD-10-CM | POA: Diagnosis not present

## 2018-07-14 DIAGNOSIS — M5136 Other intervertebral disc degeneration, lumbar region: Secondary | ICD-10-CM | POA: Diagnosis not present

## 2018-07-14 DIAGNOSIS — M9904 Segmental and somatic dysfunction of sacral region: Secondary | ICD-10-CM | POA: Diagnosis not present

## 2018-07-14 DIAGNOSIS — M9905 Segmental and somatic dysfunction of pelvic region: Secondary | ICD-10-CM | POA: Diagnosis not present

## 2018-07-14 DIAGNOSIS — M9903 Segmental and somatic dysfunction of lumbar region: Secondary | ICD-10-CM | POA: Diagnosis not present

## 2018-07-18 DIAGNOSIS — M9904 Segmental and somatic dysfunction of sacral region: Secondary | ICD-10-CM | POA: Diagnosis not present

## 2018-07-18 DIAGNOSIS — M9903 Segmental and somatic dysfunction of lumbar region: Secondary | ICD-10-CM | POA: Diagnosis not present

## 2018-07-18 DIAGNOSIS — M9905 Segmental and somatic dysfunction of pelvic region: Secondary | ICD-10-CM | POA: Diagnosis not present

## 2018-07-18 DIAGNOSIS — M5136 Other intervertebral disc degeneration, lumbar region: Secondary | ICD-10-CM | POA: Diagnosis not present

## 2018-07-21 DIAGNOSIS — M9903 Segmental and somatic dysfunction of lumbar region: Secondary | ICD-10-CM | POA: Diagnosis not present

## 2018-07-21 DIAGNOSIS — M9904 Segmental and somatic dysfunction of sacral region: Secondary | ICD-10-CM | POA: Diagnosis not present

## 2018-07-21 DIAGNOSIS — M9905 Segmental and somatic dysfunction of pelvic region: Secondary | ICD-10-CM | POA: Diagnosis not present

## 2018-07-21 DIAGNOSIS — M5136 Other intervertebral disc degeneration, lumbar region: Secondary | ICD-10-CM | POA: Diagnosis not present

## 2018-07-25 DIAGNOSIS — M9904 Segmental and somatic dysfunction of sacral region: Secondary | ICD-10-CM | POA: Diagnosis not present

## 2018-07-25 DIAGNOSIS — M5136 Other intervertebral disc degeneration, lumbar region: Secondary | ICD-10-CM | POA: Diagnosis not present

## 2018-07-25 DIAGNOSIS — M9905 Segmental and somatic dysfunction of pelvic region: Secondary | ICD-10-CM | POA: Diagnosis not present

## 2018-07-25 DIAGNOSIS — M9903 Segmental and somatic dysfunction of lumbar region: Secondary | ICD-10-CM | POA: Diagnosis not present

## 2018-07-28 DIAGNOSIS — M9905 Segmental and somatic dysfunction of pelvic region: Secondary | ICD-10-CM | POA: Diagnosis not present

## 2018-07-28 DIAGNOSIS — M9904 Segmental and somatic dysfunction of sacral region: Secondary | ICD-10-CM | POA: Diagnosis not present

## 2018-07-28 DIAGNOSIS — M5136 Other intervertebral disc degeneration, lumbar region: Secondary | ICD-10-CM | POA: Diagnosis not present

## 2018-07-28 DIAGNOSIS — M9903 Segmental and somatic dysfunction of lumbar region: Secondary | ICD-10-CM | POA: Diagnosis not present

## 2018-08-01 DIAGNOSIS — M9904 Segmental and somatic dysfunction of sacral region: Secondary | ICD-10-CM | POA: Diagnosis not present

## 2018-08-01 DIAGNOSIS — M9903 Segmental and somatic dysfunction of lumbar region: Secondary | ICD-10-CM | POA: Diagnosis not present

## 2018-08-01 DIAGNOSIS — M5136 Other intervertebral disc degeneration, lumbar region: Secondary | ICD-10-CM | POA: Diagnosis not present

## 2018-08-01 DIAGNOSIS — M9905 Segmental and somatic dysfunction of pelvic region: Secondary | ICD-10-CM | POA: Diagnosis not present

## 2018-08-09 ENCOUNTER — Other Ambulatory Visit: Payer: Self-pay

## 2018-08-09 MED ORDER — OLMESARTAN MEDOXOMIL 20 MG PO TABS: 20.0000 mg | ORAL_TABLET | Freq: Every day | ORAL | 0 refills | Status: DC

## 2018-08-09 NOTE — Telephone Encounter (Signed)
Medication filled electronically

## 2018-08-11 DIAGNOSIS — M9905 Segmental and somatic dysfunction of pelvic region: Secondary | ICD-10-CM | POA: Diagnosis not present

## 2018-08-11 DIAGNOSIS — M5136 Other intervertebral disc degeneration, lumbar region: Secondary | ICD-10-CM | POA: Diagnosis not present

## 2018-08-11 DIAGNOSIS — M9903 Segmental and somatic dysfunction of lumbar region: Secondary | ICD-10-CM | POA: Diagnosis not present

## 2018-08-11 DIAGNOSIS — M9904 Segmental and somatic dysfunction of sacral region: Secondary | ICD-10-CM | POA: Diagnosis not present

## 2018-08-15 DIAGNOSIS — M9905 Segmental and somatic dysfunction of pelvic region: Secondary | ICD-10-CM | POA: Diagnosis not present

## 2018-08-15 DIAGNOSIS — M9903 Segmental and somatic dysfunction of lumbar region: Secondary | ICD-10-CM | POA: Diagnosis not present

## 2018-08-15 DIAGNOSIS — M9904 Segmental and somatic dysfunction of sacral region: Secondary | ICD-10-CM | POA: Diagnosis not present

## 2018-08-15 DIAGNOSIS — M5136 Other intervertebral disc degeneration, lumbar region: Secondary | ICD-10-CM | POA: Diagnosis not present

## 2018-08-25 DIAGNOSIS — M9903 Segmental and somatic dysfunction of lumbar region: Secondary | ICD-10-CM | POA: Diagnosis not present

## 2018-08-25 DIAGNOSIS — M9905 Segmental and somatic dysfunction of pelvic region: Secondary | ICD-10-CM | POA: Diagnosis not present

## 2018-08-25 DIAGNOSIS — M5136 Other intervertebral disc degeneration, lumbar region: Secondary | ICD-10-CM | POA: Diagnosis not present

## 2018-08-25 DIAGNOSIS — M9904 Segmental and somatic dysfunction of sacral region: Secondary | ICD-10-CM | POA: Diagnosis not present

## 2018-08-29 ENCOUNTER — Other Ambulatory Visit: Payer: Self-pay

## 2018-08-29 DIAGNOSIS — M9905 Segmental and somatic dysfunction of pelvic region: Secondary | ICD-10-CM | POA: Diagnosis not present

## 2018-08-29 DIAGNOSIS — M9904 Segmental and somatic dysfunction of sacral region: Secondary | ICD-10-CM | POA: Diagnosis not present

## 2018-08-29 DIAGNOSIS — M9903 Segmental and somatic dysfunction of lumbar region: Secondary | ICD-10-CM | POA: Diagnosis not present

## 2018-08-29 DIAGNOSIS — M5136 Other intervertebral disc degeneration, lumbar region: Secondary | ICD-10-CM | POA: Diagnosis not present

## 2018-09-05 DIAGNOSIS — M9903 Segmental and somatic dysfunction of lumbar region: Secondary | ICD-10-CM | POA: Diagnosis not present

## 2018-09-05 DIAGNOSIS — M9905 Segmental and somatic dysfunction of pelvic region: Secondary | ICD-10-CM | POA: Diagnosis not present

## 2018-09-05 DIAGNOSIS — M5136 Other intervertebral disc degeneration, lumbar region: Secondary | ICD-10-CM | POA: Diagnosis not present

## 2018-09-05 DIAGNOSIS — M9904 Segmental and somatic dysfunction of sacral region: Secondary | ICD-10-CM | POA: Diagnosis not present

## 2018-09-12 DIAGNOSIS — M9903 Segmental and somatic dysfunction of lumbar region: Secondary | ICD-10-CM | POA: Diagnosis not present

## 2018-09-12 DIAGNOSIS — M9904 Segmental and somatic dysfunction of sacral region: Secondary | ICD-10-CM | POA: Diagnosis not present

## 2018-09-12 DIAGNOSIS — M9905 Segmental and somatic dysfunction of pelvic region: Secondary | ICD-10-CM | POA: Diagnosis not present

## 2018-09-12 DIAGNOSIS — M5136 Other intervertebral disc degeneration, lumbar region: Secondary | ICD-10-CM | POA: Diagnosis not present

## 2018-09-19 DIAGNOSIS — M9903 Segmental and somatic dysfunction of lumbar region: Secondary | ICD-10-CM | POA: Diagnosis not present

## 2018-09-19 DIAGNOSIS — M9904 Segmental and somatic dysfunction of sacral region: Secondary | ICD-10-CM | POA: Diagnosis not present

## 2018-09-19 DIAGNOSIS — M9905 Segmental and somatic dysfunction of pelvic region: Secondary | ICD-10-CM | POA: Diagnosis not present

## 2018-09-19 DIAGNOSIS — M5136 Other intervertebral disc degeneration, lumbar region: Secondary | ICD-10-CM | POA: Diagnosis not present

## 2018-09-20 DIAGNOSIS — M9904 Segmental and somatic dysfunction of sacral region: Secondary | ICD-10-CM | POA: Diagnosis not present

## 2018-09-20 DIAGNOSIS — M9905 Segmental and somatic dysfunction of pelvic region: Secondary | ICD-10-CM | POA: Diagnosis not present

## 2018-09-20 DIAGNOSIS — M5136 Other intervertebral disc degeneration, lumbar region: Secondary | ICD-10-CM | POA: Diagnosis not present

## 2018-09-20 DIAGNOSIS — M9903 Segmental and somatic dysfunction of lumbar region: Secondary | ICD-10-CM | POA: Diagnosis not present

## 2018-10-05 DIAGNOSIS — H25043 Posterior subcapsular polar age-related cataract, bilateral: Secondary | ICD-10-CM | POA: Diagnosis not present

## 2018-10-14 ENCOUNTER — Other Ambulatory Visit: Payer: Self-pay | Admitting: Internal Medicine

## 2018-11-08 ENCOUNTER — Other Ambulatory Visit: Payer: Self-pay | Admitting: Internal Medicine

## 2018-12-21 ENCOUNTER — Other Ambulatory Visit: Payer: Self-pay

## 2019-01-02 ENCOUNTER — Other Ambulatory Visit: Payer: Self-pay | Admitting: Internal Medicine

## 2019-01-02 NOTE — Telephone Encounter (Signed)
Medication Refill - Medication: rOPINIRole (REQUIP) 0.5 MG tablet   Has the patient contacted their pharmacy? Yes.   (Agent: If no, request that the patient contact the pharmacy for the refill.) (Agent: If yes, when and what did the pharmacy advise?)  Preferred Pharmacy (with phone number or street name):  [facility] [address] [place] - [address]  [address] [place] [address]  Phone: [phone] Fax: [phone]     Agent: Please be advised that RX refills may take up to 3 business days. We ask that you follow-up with your pharmacy.

## 2019-01-03 MED ORDER — ROPINIROLE HCL 0.5 MG PO TABS: ORAL_TABLET | ORAL | 0 refills | Status: DC

## 2019-01-10 ENCOUNTER — Other Ambulatory Visit: Payer: Self-pay | Admitting: Internal Medicine

## 2019-02-21 ENCOUNTER — Other Ambulatory Visit: Payer: Self-pay | Admitting: Internal Medicine

## 2019-02-21 DIAGNOSIS — M9903 Segmental and somatic dysfunction of lumbar region: Secondary | ICD-10-CM | POA: Diagnosis not present

## 2019-02-21 DIAGNOSIS — M5136 Other intervertebral disc degeneration, lumbar region: Secondary | ICD-10-CM | POA: Diagnosis not present

## 2019-02-21 DIAGNOSIS — M9904 Segmental and somatic dysfunction of sacral region: Secondary | ICD-10-CM | POA: Diagnosis not present

## 2019-02-21 DIAGNOSIS — M9905 Segmental and somatic dysfunction of pelvic region: Secondary | ICD-10-CM | POA: Diagnosis not present

## 2019-02-23 DIAGNOSIS — M5136 Other intervertebral disc degeneration, lumbar region: Secondary | ICD-10-CM | POA: Diagnosis not present

## 2019-02-23 DIAGNOSIS — M9904 Segmental and somatic dysfunction of sacral region: Secondary | ICD-10-CM | POA: Diagnosis not present

## 2019-02-23 DIAGNOSIS — M9903 Segmental and somatic dysfunction of lumbar region: Secondary | ICD-10-CM | POA: Diagnosis not present

## 2019-02-23 DIAGNOSIS — M9905 Segmental and somatic dysfunction of pelvic region: Secondary | ICD-10-CM | POA: Diagnosis not present

## 2019-02-24 DIAGNOSIS — R32 Unspecified urinary incontinence: Secondary | ICD-10-CM | POA: Diagnosis not present

## 2019-02-24 DIAGNOSIS — I10 Essential (primary) hypertension: Secondary | ICD-10-CM | POA: Diagnosis not present

## 2019-02-24 DIAGNOSIS — K219 Gastro-esophageal reflux disease without esophagitis: Secondary | ICD-10-CM | POA: Diagnosis not present

## 2019-02-24 DIAGNOSIS — Z6837 Body mass index (BMI) 37.0-37.9, adult: Secondary | ICD-10-CM | POA: Diagnosis not present

## 2019-02-24 DIAGNOSIS — M1990 Unspecified osteoarthritis, unspecified site: Secondary | ICD-10-CM | POA: Diagnosis not present

## 2019-02-24 DIAGNOSIS — G2581 Restless legs syndrome: Secondary | ICD-10-CM | POA: Diagnosis not present

## 2019-02-24 DIAGNOSIS — G8929 Other chronic pain: Secondary | ICD-10-CM | POA: Diagnosis not present

## 2019-02-24 DIAGNOSIS — E039 Hypothyroidism, unspecified: Secondary | ICD-10-CM | POA: Diagnosis not present

## 2019-02-24 DIAGNOSIS — E785 Hyperlipidemia, unspecified: Secondary | ICD-10-CM | POA: Diagnosis not present

## 2019-02-27 DIAGNOSIS — M9905 Segmental and somatic dysfunction of pelvic region: Secondary | ICD-10-CM | POA: Diagnosis not present

## 2019-02-27 DIAGNOSIS — M9903 Segmental and somatic dysfunction of lumbar region: Secondary | ICD-10-CM | POA: Diagnosis not present

## 2019-02-27 DIAGNOSIS — M5136 Other intervertebral disc degeneration, lumbar region: Secondary | ICD-10-CM | POA: Diagnosis not present

## 2019-02-27 DIAGNOSIS — M9904 Segmental and somatic dysfunction of sacral region: Secondary | ICD-10-CM | POA: Diagnosis not present

## 2019-02-28 DIAGNOSIS — M9903 Segmental and somatic dysfunction of lumbar region: Secondary | ICD-10-CM | POA: Diagnosis not present

## 2019-02-28 DIAGNOSIS — M9905 Segmental and somatic dysfunction of pelvic region: Secondary | ICD-10-CM | POA: Diagnosis not present

## 2019-02-28 DIAGNOSIS — M9904 Segmental and somatic dysfunction of sacral region: Secondary | ICD-10-CM | POA: Diagnosis not present

## 2019-02-28 DIAGNOSIS — M5136 Other intervertebral disc degeneration, lumbar region: Secondary | ICD-10-CM | POA: Diagnosis not present

## 2019-03-02 DIAGNOSIS — M5136 Other intervertebral disc degeneration, lumbar region: Secondary | ICD-10-CM | POA: Diagnosis not present

## 2019-03-02 DIAGNOSIS — M9903 Segmental and somatic dysfunction of lumbar region: Secondary | ICD-10-CM | POA: Diagnosis not present

## 2019-03-02 DIAGNOSIS — M9905 Segmental and somatic dysfunction of pelvic region: Secondary | ICD-10-CM | POA: Diagnosis not present

## 2019-03-02 DIAGNOSIS — M9904 Segmental and somatic dysfunction of sacral region: Secondary | ICD-10-CM | POA: Diagnosis not present

## 2019-03-04 IMAGING — CT CT ABD-PELV W/ CM
2 of 5 series · 17 of 46 positions shown, 19 images · IV contrast (ISOVUE)
Comparison: 01/29/2017.  05/10/2012.

CLINICAL DATA: History of diverticulitis being treated with
antibiotics. Recurrent fever.

EXAM:
CT ABDOMEN AND PELVIS WITH CONTRAST
TECHNIQUE: Multidetector CT imaging of the abdomen and pelvis was performed
using the standard protocol following bolus administration of
intravenous contrast.
CONTRAST:  100mL [id number] IOPAMIDOL ([id number]) INJECTION 61%

[Series 2: axial st · axial · 0.75mm/px · z∈[+1341,+1721]mm · 14 of 88 slices shown, 16 images]
[im 6/88  soft-tissue]
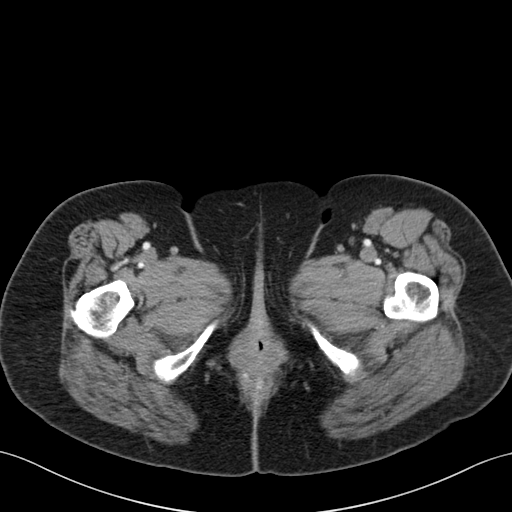
[im 6/88  bone]
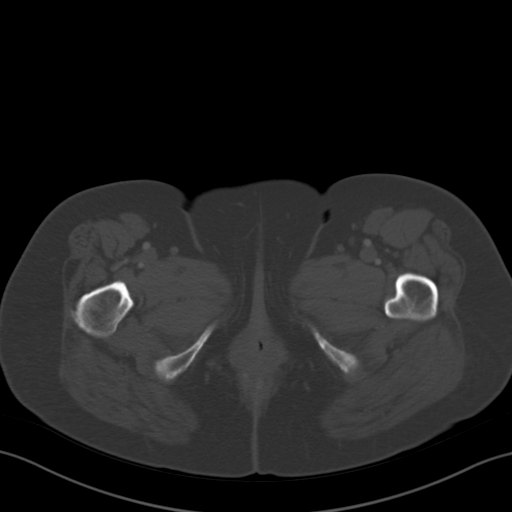
[im 11/88  soft-tissue]
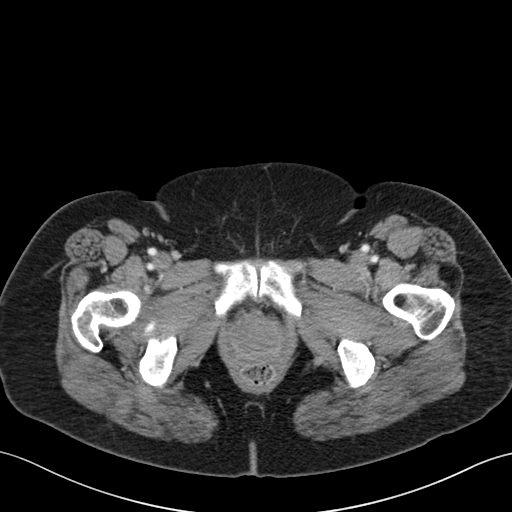
[im 17/88  soft-tissue]
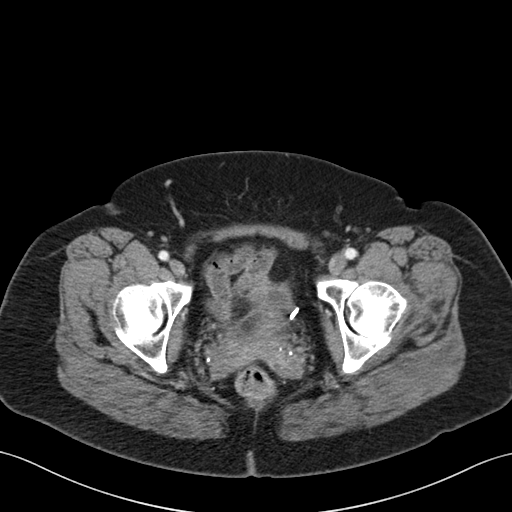
[im 22/88  soft-tissue]
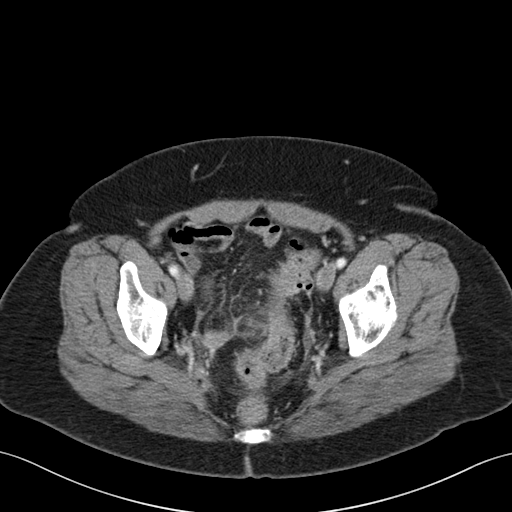
[im 28/88  soft-tissue]
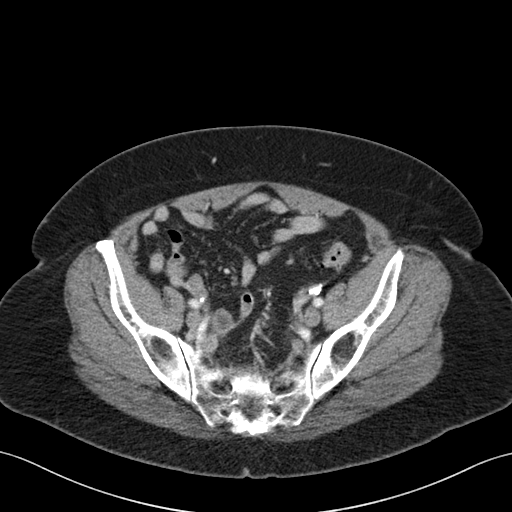
[im 33/88  soft-tissue]
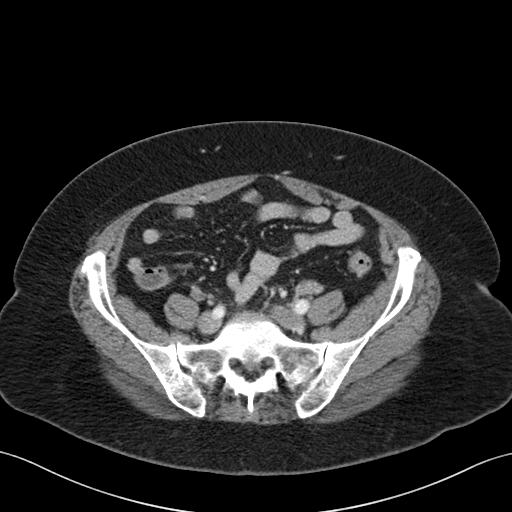
[im 39/88  soft-tissue]
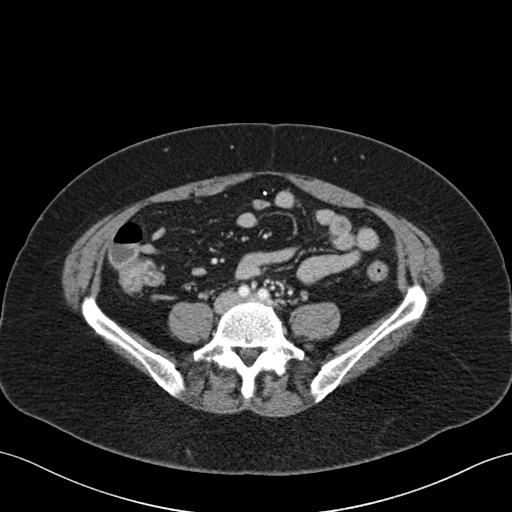
[im 49/88  soft-tissue]
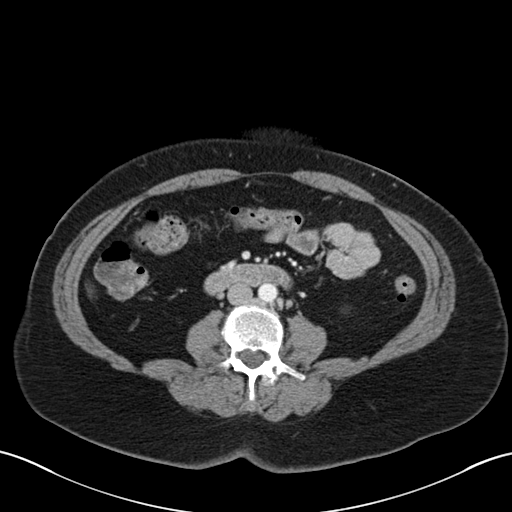
[im 55/88  soft-tissue]
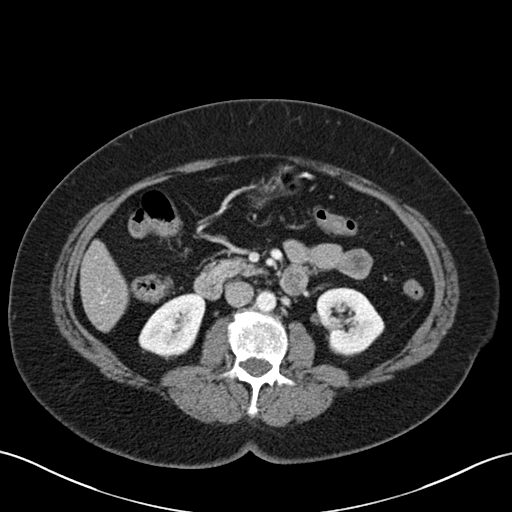
[im 55/88  bone]
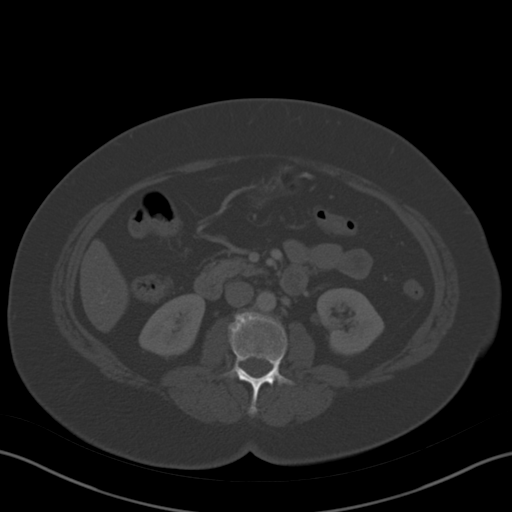
[im 60/88  soft-tissue]
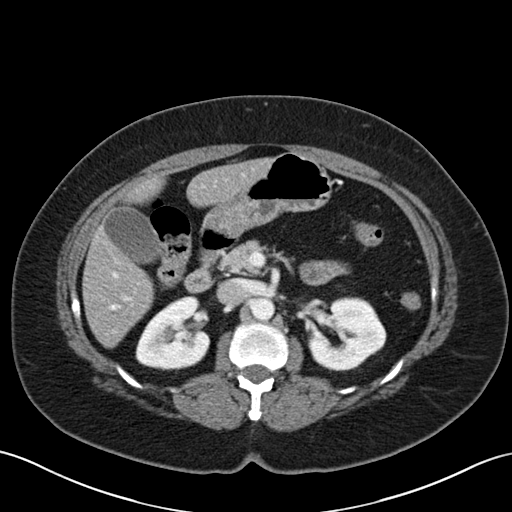
[im 66/88  soft-tissue]
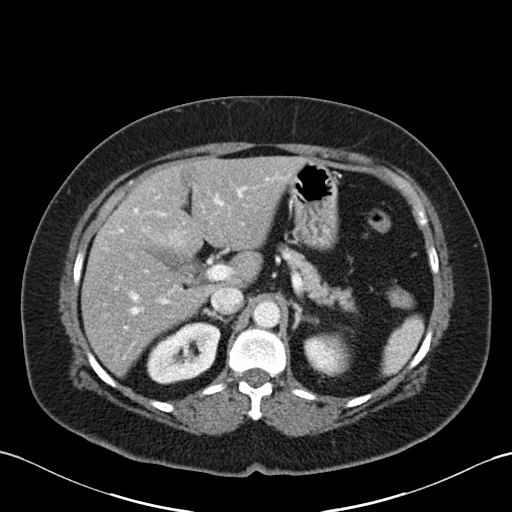
[im 71/88  soft-tissue]
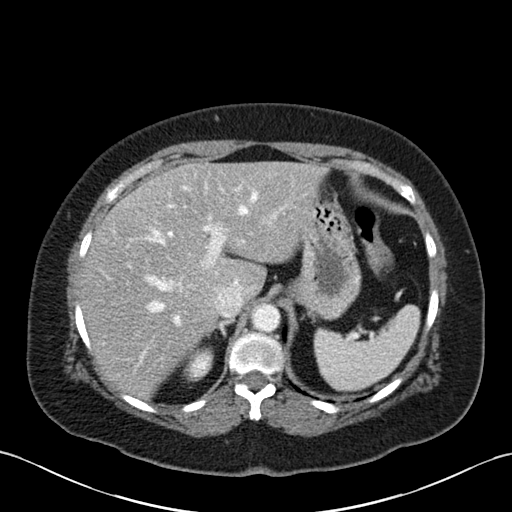
[im 77/88  soft-tissue]
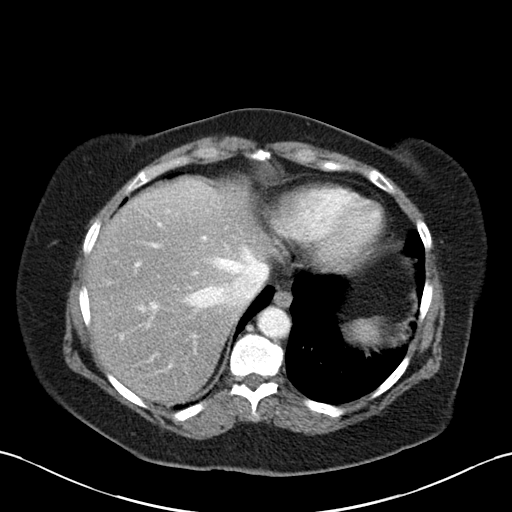
[im 82/88  soft-tissue]
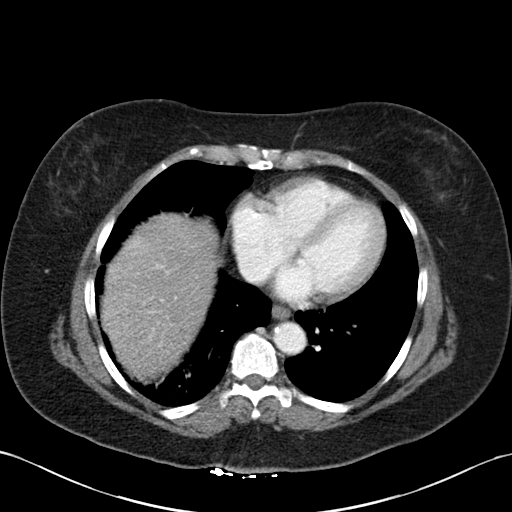

[Series 4: coronal st · coronal · 0.90mm/px · 3 of 73 slices shown]
[im 25/73  soft-tissue]
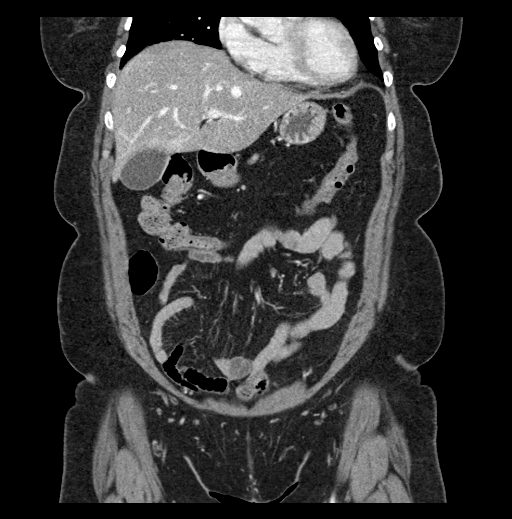
[im 33/73  soft-tissue]
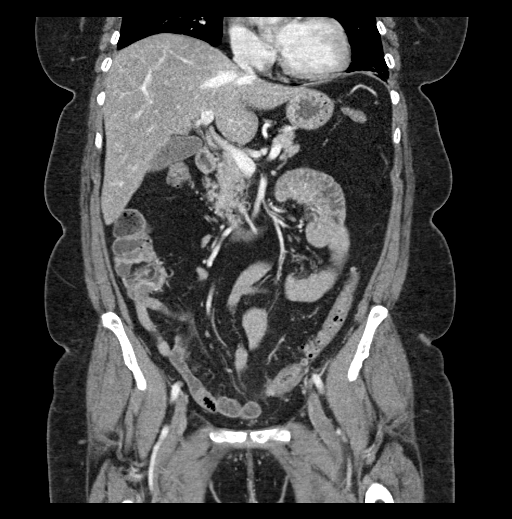
[im 41/73  soft-tissue]
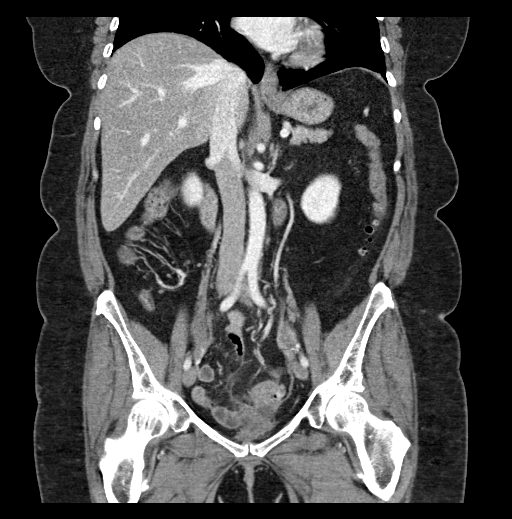

[17 of 46 positions shown; findings below may reference images not displayed]

FINDINGS: Lower chest: Mild atelectasis/scarring at both lung bases. No
pleural or pericardial fluid.

Hepatobiliary: Fatty change of the liver.  No calcified gallstones.

Pancreas: Normal

Spleen: Normal

Adrenals/Urinary Tract: Adrenal glands are normal. Kidneys are
normal. Bladder is normal.

Stomach/Bowel: No upper intestinal pathology. No abnormality of the
colon until the descending colon where there is diverticulosis.
There is acute diverticulitis in the sigmoid region with thickening
and surrounding stranding. This has worsened compared to the study
[DATE]. I do not see a discrete abscess however.

Vascular/Lymphatic: Aorta and IVC are normal. No retroperitoneal
adenopathy.

Reproductive: Previous hysterectomy.  No pelvic mass.

Other: No free fluid or air.

Musculoskeletal: Mild lumbar degenerative changes.
IMPRESSION: Persistence and worsening of sigmoid diverticulitis with more
regional fat stranding. No abscess, free fluid or free air is
identified.

Diffuse fatty change of the liver.

## 2019-03-06 DIAGNOSIS — M9903 Segmental and somatic dysfunction of lumbar region: Secondary | ICD-10-CM | POA: Diagnosis not present

## 2019-03-06 DIAGNOSIS — M9905 Segmental and somatic dysfunction of pelvic region: Secondary | ICD-10-CM | POA: Diagnosis not present

## 2019-03-06 DIAGNOSIS — M5136 Other intervertebral disc degeneration, lumbar region: Secondary | ICD-10-CM | POA: Diagnosis not present

## 2019-03-06 DIAGNOSIS — M9904 Segmental and somatic dysfunction of sacral region: Secondary | ICD-10-CM | POA: Diagnosis not present

## 2019-03-07 DIAGNOSIS — M5136 Other intervertebral disc degeneration, lumbar region: Secondary | ICD-10-CM | POA: Diagnosis not present

## 2019-03-07 DIAGNOSIS — M9904 Segmental and somatic dysfunction of sacral region: Secondary | ICD-10-CM | POA: Diagnosis not present

## 2019-03-07 DIAGNOSIS — M9905 Segmental and somatic dysfunction of pelvic region: Secondary | ICD-10-CM | POA: Diagnosis not present

## 2019-03-07 DIAGNOSIS — M9903 Segmental and somatic dysfunction of lumbar region: Secondary | ICD-10-CM | POA: Diagnosis not present

## 2019-03-09 DIAGNOSIS — M9905 Segmental and somatic dysfunction of pelvic region: Secondary | ICD-10-CM | POA: Diagnosis not present

## 2019-03-09 DIAGNOSIS — M5136 Other intervertebral disc degeneration, lumbar region: Secondary | ICD-10-CM | POA: Diagnosis not present

## 2019-03-09 DIAGNOSIS — M9903 Segmental and somatic dysfunction of lumbar region: Secondary | ICD-10-CM | POA: Diagnosis not present

## 2019-03-09 DIAGNOSIS — M9904 Segmental and somatic dysfunction of sacral region: Secondary | ICD-10-CM | POA: Diagnosis not present

## 2019-03-14 DIAGNOSIS — M5136 Other intervertebral disc degeneration, lumbar region: Secondary | ICD-10-CM | POA: Diagnosis not present

## 2019-03-14 DIAGNOSIS — M9904 Segmental and somatic dysfunction of sacral region: Secondary | ICD-10-CM | POA: Diagnosis not present

## 2019-03-14 DIAGNOSIS — M9905 Segmental and somatic dysfunction of pelvic region: Secondary | ICD-10-CM | POA: Diagnosis not present

## 2019-03-14 DIAGNOSIS — M9903 Segmental and somatic dysfunction of lumbar region: Secondary | ICD-10-CM | POA: Diagnosis not present

## 2019-03-20 DIAGNOSIS — M5136 Other intervertebral disc degeneration, lumbar region: Secondary | ICD-10-CM | POA: Diagnosis not present

## 2019-03-20 DIAGNOSIS — M9903 Segmental and somatic dysfunction of lumbar region: Secondary | ICD-10-CM | POA: Diagnosis not present

## 2019-03-20 DIAGNOSIS — M9904 Segmental and somatic dysfunction of sacral region: Secondary | ICD-10-CM | POA: Diagnosis not present

## 2019-03-20 DIAGNOSIS — M9905 Segmental and somatic dysfunction of pelvic region: Secondary | ICD-10-CM | POA: Diagnosis not present

## 2019-03-21 DIAGNOSIS — M9904 Segmental and somatic dysfunction of sacral region: Secondary | ICD-10-CM | POA: Diagnosis not present

## 2019-03-21 DIAGNOSIS — M9903 Segmental and somatic dysfunction of lumbar region: Secondary | ICD-10-CM | POA: Diagnosis not present

## 2019-03-21 DIAGNOSIS — M9905 Segmental and somatic dysfunction of pelvic region: Secondary | ICD-10-CM | POA: Diagnosis not present

## 2019-03-21 DIAGNOSIS — M5136 Other intervertebral disc degeneration, lumbar region: Secondary | ICD-10-CM | POA: Diagnosis not present

## 2019-03-22 DIAGNOSIS — M9905 Segmental and somatic dysfunction of pelvic region: Secondary | ICD-10-CM | POA: Diagnosis not present

## 2019-03-22 DIAGNOSIS — M85852 Other specified disorders of bone density and structure, left thigh: Secondary | ICD-10-CM | POA: Diagnosis not present

## 2019-03-22 DIAGNOSIS — Z1231 Encounter for screening mammogram for malignant neoplasm of breast: Secondary | ICD-10-CM | POA: Diagnosis not present

## 2019-03-22 DIAGNOSIS — M9903 Segmental and somatic dysfunction of lumbar region: Secondary | ICD-10-CM | POA: Diagnosis not present

## 2019-03-22 DIAGNOSIS — M9904 Segmental and somatic dysfunction of sacral region: Secondary | ICD-10-CM | POA: Diagnosis not present

## 2019-03-22 DIAGNOSIS — M5136 Other intervertebral disc degeneration, lumbar region: Secondary | ICD-10-CM | POA: Diagnosis not present

## 2019-03-22 DIAGNOSIS — M85851 Other specified disorders of bone density and structure, right thigh: Secondary | ICD-10-CM | POA: Diagnosis not present

## 2019-03-22 LAB — HM MAMMOGRAPHY

## 2019-03-22 LAB — HM DEXA SCAN

## 2019-03-27 DIAGNOSIS — M9904 Segmental and somatic dysfunction of sacral region: Secondary | ICD-10-CM | POA: Diagnosis not present

## 2019-03-27 DIAGNOSIS — M9903 Segmental and somatic dysfunction of lumbar region: Secondary | ICD-10-CM | POA: Diagnosis not present

## 2019-03-27 DIAGNOSIS — M5136 Other intervertebral disc degeneration, lumbar region: Secondary | ICD-10-CM | POA: Diagnosis not present

## 2019-03-27 DIAGNOSIS — M9905 Segmental and somatic dysfunction of pelvic region: Secondary | ICD-10-CM | POA: Diagnosis not present

## 2019-03-28 DIAGNOSIS — M9903 Segmental and somatic dysfunction of lumbar region: Secondary | ICD-10-CM | POA: Diagnosis not present

## 2019-03-28 DIAGNOSIS — M5136 Other intervertebral disc degeneration, lumbar region: Secondary | ICD-10-CM | POA: Diagnosis not present

## 2019-03-28 DIAGNOSIS — M9904 Segmental and somatic dysfunction of sacral region: Secondary | ICD-10-CM | POA: Diagnosis not present

## 2019-03-28 DIAGNOSIS — M9905 Segmental and somatic dysfunction of pelvic region: Secondary | ICD-10-CM | POA: Diagnosis not present

## 2019-03-30 ENCOUNTER — Encounter: Payer: Self-pay | Admitting: Internal Medicine

## 2019-03-30 DIAGNOSIS — M9903 Segmental and somatic dysfunction of lumbar region: Secondary | ICD-10-CM | POA: Diagnosis not present

## 2019-03-30 DIAGNOSIS — M9905 Segmental and somatic dysfunction of pelvic region: Secondary | ICD-10-CM | POA: Diagnosis not present

## 2019-03-30 DIAGNOSIS — M5136 Other intervertebral disc degeneration, lumbar region: Secondary | ICD-10-CM | POA: Diagnosis not present

## 2019-03-30 DIAGNOSIS — M9904 Segmental and somatic dysfunction of sacral region: Secondary | ICD-10-CM | POA: Diagnosis not present

## 2019-03-31 ENCOUNTER — Encounter: Payer: Self-pay | Admitting: Internal Medicine

## 2019-04-03 DIAGNOSIS — M5136 Other intervertebral disc degeneration, lumbar region: Secondary | ICD-10-CM | POA: Diagnosis not present

## 2019-04-03 DIAGNOSIS — M9904 Segmental and somatic dysfunction of sacral region: Secondary | ICD-10-CM | POA: Diagnosis not present

## 2019-04-03 DIAGNOSIS — M9903 Segmental and somatic dysfunction of lumbar region: Secondary | ICD-10-CM | POA: Diagnosis not present

## 2019-04-03 DIAGNOSIS — M9905 Segmental and somatic dysfunction of pelvic region: Secondary | ICD-10-CM | POA: Diagnosis not present

## 2019-04-06 DIAGNOSIS — M9905 Segmental and somatic dysfunction of pelvic region: Secondary | ICD-10-CM | POA: Diagnosis not present

## 2019-04-06 DIAGNOSIS — M5136 Other intervertebral disc degeneration, lumbar region: Secondary | ICD-10-CM | POA: Diagnosis not present

## 2019-04-06 DIAGNOSIS — M9903 Segmental and somatic dysfunction of lumbar region: Secondary | ICD-10-CM | POA: Diagnosis not present

## 2019-04-06 DIAGNOSIS — M9904 Segmental and somatic dysfunction of sacral region: Secondary | ICD-10-CM | POA: Diagnosis not present

## 2019-04-07 ENCOUNTER — Telehealth: Payer: Self-pay | Admitting: Internal Medicine

## 2019-04-07 NOTE — Telephone Encounter (Signed)
Pt is wondering is she is ok to receive COVID vaccine? She is also wondering about her Bone Density results?  Pt can be reached at [phone]  They are trying to get COVID vaccine appt next Wednesday on 3/17 and would like answer before then

## 2019-04-10 DIAGNOSIS — M9905 Segmental and somatic dysfunction of pelvic region: Secondary | ICD-10-CM | POA: Diagnosis not present

## 2019-04-10 DIAGNOSIS — M5136 Other intervertebral disc degeneration, lumbar region: Secondary | ICD-10-CM | POA: Diagnosis not present

## 2019-04-10 DIAGNOSIS — M9904 Segmental and somatic dysfunction of sacral region: Secondary | ICD-10-CM | POA: Diagnosis not present

## 2019-04-10 DIAGNOSIS — M9903 Segmental and somatic dysfunction of lumbar region: Secondary | ICD-10-CM | POA: Diagnosis not present

## 2019-04-10 NOTE — Telephone Encounter (Signed)
covid vaccine  Ok (based on record  ( only contraindication  if allergic to injection of polyethylene glycol type meds)  Looking for  scanned dexa  Report    Showed some osteopenia I guess but dont find in  record   Tillie Rung  Can you find the report in the record  so as to tell patient?

## 2019-04-10 NOTE — Telephone Encounter (Signed)
Based on pt hx, is it okay for pt to received Covid-19 vaccination?

## 2019-04-10 NOTE — Telephone Encounter (Signed)
So I found the report  showing an increase in bone deni sty  Left hip and rest  stable  Osteopenia  Send her a copy   Put on your desk basket

## 2019-04-11 ENCOUNTER — Telehealth: Payer: Self-pay | Admitting: Internal Medicine

## 2019-04-11 ENCOUNTER — Other Ambulatory Visit: Payer: Self-pay | Admitting: Internal Medicine

## 2019-04-11 DIAGNOSIS — M9905 Segmental and somatic dysfunction of pelvic region: Secondary | ICD-10-CM | POA: Diagnosis not present

## 2019-04-11 DIAGNOSIS — M9903 Segmental and somatic dysfunction of lumbar region: Secondary | ICD-10-CM | POA: Diagnosis not present

## 2019-04-11 DIAGNOSIS — M9904 Segmental and somatic dysfunction of sacral region: Secondary | ICD-10-CM | POA: Diagnosis not present

## 2019-04-11 DIAGNOSIS — M5136 Other intervertebral disc degeneration, lumbar region: Secondary | ICD-10-CM | POA: Diagnosis not present

## 2019-04-11 NOTE — Telephone Encounter (Signed)
It is her choice      She can go to an allergist and ask about this if she wishes .  I do not see that hydralazine is related to PEG   That is in the vaccine.   But  If this was and injectable medication  Consider asking allergist   About risk

## 2019-04-11 NOTE — Telephone Encounter (Signed)
Patient notified of update  and verbalized understanding.    Pt is apprehensive about receiving the Covid vaccine still. She stated that she almost died from the Hydralazine and was told at the vaccination clinic that she should not get the vaccine bc of this.   Please advise

## 2019-04-11 NOTE — Telephone Encounter (Signed)
Pt is calling back to follow up if she can receive her second dosage of COVID that is scheduled tomorrow morning? Thanks

## 2019-04-12 NOTE — Telephone Encounter (Signed)
Patient notified of update  and verbalized understanding. 

## 2019-04-13 DIAGNOSIS — M9904 Segmental and somatic dysfunction of sacral region: Secondary | ICD-10-CM | POA: Diagnosis not present

## 2019-04-13 DIAGNOSIS — M9905 Segmental and somatic dysfunction of pelvic region: Secondary | ICD-10-CM | POA: Diagnosis not present

## 2019-04-13 DIAGNOSIS — M5136 Other intervertebral disc degeneration, lumbar region: Secondary | ICD-10-CM | POA: Diagnosis not present

## 2019-04-13 DIAGNOSIS — M9903 Segmental and somatic dysfunction of lumbar region: Secondary | ICD-10-CM | POA: Diagnosis not present

## 2019-04-17 DIAGNOSIS — M9904 Segmental and somatic dysfunction of sacral region: Secondary | ICD-10-CM | POA: Diagnosis not present

## 2019-04-17 DIAGNOSIS — M9905 Segmental and somatic dysfunction of pelvic region: Secondary | ICD-10-CM | POA: Diagnosis not present

## 2019-04-17 DIAGNOSIS — M5136 Other intervertebral disc degeneration, lumbar region: Secondary | ICD-10-CM | POA: Diagnosis not present

## 2019-04-17 DIAGNOSIS — M9903 Segmental and somatic dysfunction of lumbar region: Secondary | ICD-10-CM | POA: Diagnosis not present

## 2019-04-18 DIAGNOSIS — M9903 Segmental and somatic dysfunction of lumbar region: Secondary | ICD-10-CM | POA: Diagnosis not present

## 2019-04-18 DIAGNOSIS — M5136 Other intervertebral disc degeneration, lumbar region: Secondary | ICD-10-CM | POA: Diagnosis not present

## 2019-04-18 DIAGNOSIS — M9904 Segmental and somatic dysfunction of sacral region: Secondary | ICD-10-CM | POA: Diagnosis not present

## 2019-04-18 DIAGNOSIS — M9905 Segmental and somatic dysfunction of pelvic region: Secondary | ICD-10-CM | POA: Diagnosis not present

## 2019-04-20 DIAGNOSIS — M9904 Segmental and somatic dysfunction of sacral region: Secondary | ICD-10-CM | POA: Diagnosis not present

## 2019-04-20 DIAGNOSIS — M5136 Other intervertebral disc degeneration, lumbar region: Secondary | ICD-10-CM | POA: Diagnosis not present

## 2019-04-20 DIAGNOSIS — M9905 Segmental and somatic dysfunction of pelvic region: Secondary | ICD-10-CM | POA: Diagnosis not present

## 2019-04-20 DIAGNOSIS — M9903 Segmental and somatic dysfunction of lumbar region: Secondary | ICD-10-CM | POA: Diagnosis not present

## 2019-04-24 DIAGNOSIS — M9903 Segmental and somatic dysfunction of lumbar region: Secondary | ICD-10-CM | POA: Diagnosis not present

## 2019-04-24 DIAGNOSIS — M9905 Segmental and somatic dysfunction of pelvic region: Secondary | ICD-10-CM | POA: Diagnosis not present

## 2019-04-24 DIAGNOSIS — M9904 Segmental and somatic dysfunction of sacral region: Secondary | ICD-10-CM | POA: Diagnosis not present

## 2019-04-24 DIAGNOSIS — M5136 Other intervertebral disc degeneration, lumbar region: Secondary | ICD-10-CM | POA: Diagnosis not present

## 2019-04-27 DIAGNOSIS — M5136 Other intervertebral disc degeneration, lumbar region: Secondary | ICD-10-CM | POA: Diagnosis not present

## 2019-04-27 DIAGNOSIS — M9905 Segmental and somatic dysfunction of pelvic region: Secondary | ICD-10-CM | POA: Diagnosis not present

## 2019-04-27 DIAGNOSIS — M9903 Segmental and somatic dysfunction of lumbar region: Secondary | ICD-10-CM | POA: Diagnosis not present

## 2019-04-27 DIAGNOSIS — M9904 Segmental and somatic dysfunction of sacral region: Secondary | ICD-10-CM | POA: Diagnosis not present

## 2019-05-01 DIAGNOSIS — M9903 Segmental and somatic dysfunction of lumbar region: Secondary | ICD-10-CM | POA: Diagnosis not present

## 2019-05-01 DIAGNOSIS — M5136 Other intervertebral disc degeneration, lumbar region: Secondary | ICD-10-CM | POA: Diagnosis not present

## 2019-05-01 DIAGNOSIS — M9904 Segmental and somatic dysfunction of sacral region: Secondary | ICD-10-CM | POA: Diagnosis not present

## 2019-05-01 DIAGNOSIS — M9905 Segmental and somatic dysfunction of pelvic region: Secondary | ICD-10-CM | POA: Diagnosis not present

## 2019-05-02 DIAGNOSIS — M9905 Segmental and somatic dysfunction of pelvic region: Secondary | ICD-10-CM | POA: Diagnosis not present

## 2019-05-02 DIAGNOSIS — M5136 Other intervertebral disc degeneration, lumbar region: Secondary | ICD-10-CM | POA: Diagnosis not present

## 2019-05-02 DIAGNOSIS — M9904 Segmental and somatic dysfunction of sacral region: Secondary | ICD-10-CM | POA: Diagnosis not present

## 2019-05-02 DIAGNOSIS — M9903 Segmental and somatic dysfunction of lumbar region: Secondary | ICD-10-CM | POA: Diagnosis not present

## 2019-05-04 DIAGNOSIS — M9904 Segmental and somatic dysfunction of sacral region: Secondary | ICD-10-CM | POA: Diagnosis not present

## 2019-05-04 DIAGNOSIS — M5136 Other intervertebral disc degeneration, lumbar region: Secondary | ICD-10-CM | POA: Diagnosis not present

## 2019-05-04 DIAGNOSIS — M9905 Segmental and somatic dysfunction of pelvic region: Secondary | ICD-10-CM | POA: Diagnosis not present

## 2019-05-04 DIAGNOSIS — M9903 Segmental and somatic dysfunction of lumbar region: Secondary | ICD-10-CM | POA: Diagnosis not present

## 2019-05-07 DIAGNOSIS — M9905 Segmental and somatic dysfunction of pelvic region: Secondary | ICD-10-CM | POA: Diagnosis not present

## 2019-05-07 DIAGNOSIS — M9904 Segmental and somatic dysfunction of sacral region: Secondary | ICD-10-CM | POA: Diagnosis not present

## 2019-05-07 DIAGNOSIS — M9903 Segmental and somatic dysfunction of lumbar region: Secondary | ICD-10-CM | POA: Diagnosis not present

## 2019-05-07 DIAGNOSIS — M5136 Other intervertebral disc degeneration, lumbar region: Secondary | ICD-10-CM | POA: Diagnosis not present

## 2019-05-29 ENCOUNTER — Other Ambulatory Visit: Payer: Self-pay | Admitting: Internal Medicine

## 2019-05-29 DIAGNOSIS — S76011A Strain of muscle, fascia and tendon of right hip, initial encounter: Secondary | ICD-10-CM | POA: Diagnosis not present

## 2019-06-19 ENCOUNTER — Other Ambulatory Visit: Payer: Self-pay

## 2019-06-19 NOTE — Progress Notes (Signed)
Chief Complaint  Patient presents with  . Medicare Wellness    Doing well  . Medication Management  . Annual Exam    HPI: [name] 69 y.o. comes in today for Preventive Medicare exam/ wellness visit .and med eaval  Missed exam l;ast year  BP ok  sometimes 150 range  Usually 130  THyroid will need   Brand  PA  For refill  Generic donet work   LIPIDS: taking meds   RLS contnues at times   Has gained weight during covid   Has not had covid vaccine cause of her hx of anaphylaxis with injectable med in hosp  Hydralazine   Family is vaccinated   Thinks could have hernia incisional  Has ocass tenderness  Thinks swollen at times  Not sure  If bulging.    Health Maintenance  Topic Date Due  . COVID-19 Vaccine (1) Never done  . INFLUENZA VACCINE  08/27/2019  . MAMMOGRAM  03/21/2021  . TETANUS/TDAP  01/25/2023  . COLONOSCOPY  09/08/2027  . DEXA SCAN  Completed  . Hepatitis C Screening  Completed  . 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 Last eye check UTD had glasses   Safety:  Has smoke detector and wears seat belts.  No firearms. No excess sun exposure. Sees dentist regularly.  Falls: n  Advance directive :  Reviewed  Has one. No ?s   Memory: Felt to be good  , no concern from her or her family.  Depression: No anhedonia unusual crying or depressive symptoms  Nutrition: Eats well balanced diet; adequate calcium and vitamin D. No swallowing chewing problems.  Injury: no major injuries in the last six months.  Other healthcare providers:  Reviewed today .Marland Kitchen   Preventive parameters: up-to-date  Reviewed   ADLS:   There are no problems or need for assistance  driving,  feeding, obtaining food, dressing, toileting and bathing, managing money using phone. She is independent.    ROS:  See hpi  GEN/ HEENT: No fever, significant weight changes sweats headaches vision problems hearing changes, CV/ PULM; No chest pain shortness of breath cough, syncope,edema  change in exercise tolerance. GI /GU: No adominal pain, vomiting, change in bowel habits. No blood in the stool. No significant GU symptoms. SKIN/HEME: ,no acute skin rashes suspicious lesions or bleeding. No lymphadenopathy, nodules, masses.  NEURO/ PSYCH:  No neurologic signs such as weakness numbness. No depression anxiety. IMM/ Allergy: No unusual infections.  Allergy .   REST of 12 system review negative except as per HPI   Past Medical History:  Diagnosis Date  . Acute pyelonephritis 05/02/2012  . Asthma   . Diverticulitis 03/24/2017   see report [facility] [place]  . Dyspnea    nl PFTs, and echo  . GERD (gastroesophageal reflux disease)   . Headache(784.0)   . Heart murmur   . History of kidney stones   . IBS (irritable bowel syndrome)   . Nephrolithiasis   . Pre-diabetes 12/15  . Recurrent oral ulcers   . Thyroid goiter  I 131 rx and then  total throidectomy 2005 [facility]    Family History  Problem Relation Age of Onset  . COPD Sister   . Coronary artery disease Sister        age 57 also copd  . Cancer Mother        bladder  . Arthritis Other   . Cancer Other        breast  . Diabetes Other   . Hyperlipidemia Other   . Hypertension Other   . Stroke Other   . Heart disease Other   . Coronary artery disease Father        also had AAA  . Aneurysm Father        Aortic and thoracic fatal    Social History   Socioeconomic History  . Marital status: Married    Spouse name: Not on file  . Number of children: Not on file  . Years of education: Not on file  . Highest education level: Not on file  Occupational History  . Not on file  Tobacco Use  . Smoking status:  Never Smoker  . Smokeless tobacco: Never Used  Substance and Sexual Activity  . Alcohol use: Yes    Comment: seldom  . Drug use: No  . Sexual activity: Not on file  Other Topics Concern  . 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 Difficulty of Paying Living Expenses:   Food Insecurity:   . Worried About Charity fundraiser in the Last Year:   . Arboriculturist in the Last Year:   Transportation Needs:   . Film/video editor (Medical):   Marland Kitchen Lack of Transportation (Non-Medical):   Physical Activity:   . Days of Exercise per Week:   . Minutes of Exercise per Session:   Stress:   . Feeling of Stress :   Social Connections:   . Frequency of Communication with Friends and Family:   . Frequency of Social Gatherings with Friends and Family:   . Attends Religious Services:   . Active Member of Clubs or Organizations:   . Attends Archivist Meetings:   Marland Kitchen Marital Status:     Outpatient Encounter Medications as of 06/20/2019  Medication Sig  . acetaminophen (TYLENOL) 500 MG tablet Take 1,000 mg by mouth 3 (three) times daily.   . bisacodyl (DULCOLAX) 5 MG EC tablet Take 1 tablet (5 mg total) by mouth daily as needed for moderate constipation.  . docusate sodium (COLACE) 100 MG capsule Take 1 capsule (100 mg total) by mouth daily as needed for mild constipation.  Marland Kitchen olmesartan (BENICAR) 20 MG tablet Take 1 tablet (20 mg total) by mouth daily. Schedule appointment for refills. [phone]  . pantoprazole (PROTONIX) 40 MG tablet TAKE ONE TABLET BY MOUTH TWICE A DAY  . rOPINIRole (REQUIP) 0.5 MG tablet TAKE 1 TO 2 TABLETS BY MOUTH AT NOON, 5PM, AND THEN 3 TABLETS AT BEDTIME  . rosuvastatin (CRESTOR) 10 MG tablet TAKE ONE TABLET BY MOUTH DAILY  . SYNTHROID 112 MCG tablet TAKE ONE TABLET BY MOUTH EVERY  MORNING BEFORE BREAKFAST  . methocarbamol (ROBAXIN) 500 MG tablet Take 1-2 tablets (500-1,000 mg total) by mouth every 8 (  eight) hours as needed for muscle spasms. (Patient not taking: Reported on 06/20/2019)   No facility-administered encounter medications on file as of 06/20/2019.    EXAM:  BP 128/84   Pulse 68   Temp 98 F (36.7 C) (Temporal)   Ht 5' (1.524 m)   Wt 181 lb 9.6 oz (82.4 kg)   SpO2 96%   BMI 35.47 kg/m   Body mass index is 35.47 kg/m.  Hearing Screening   125Hz  250Hz  500Hz  1000Hz  2000Hz  3000Hz  4000Hz  6000Hz  8000Hz   Right ear:   Pass Pass Pass  Pass    Left ear:   Pass Fail Pass  Fail      Visual Acuity Screening   Right eye Left eye Both eyes  Without correction:     With correction: 20/100 20/100 20/100  not sure of vision  Screen  Has glasses   Physical Exam: Vital signs reviewed [id number] is a well-developed well-nourished alert cooperative   who appears stated age in no acute distress.  HEENT: normocephalic atraumatic , Eyes: PERRL EOM's full, conjunctiva clear, Nares: paten,t no deformity discharge or tenderness., Ears: no deformity EAC's clear TMs with normal landmarks. Mouth:masked NECK: supple without masses, thyromegaly or bruits. CHEST/PULM:  Clear to auscultation and percussion breath sounds equal no wheeze , rales or rhonchi. No chest wall deformities or tenderness. Breast: normal by inspection . No dimpling, discharge, masses, tenderness or discharge . CV: PMI is nondisplaced, S1 S2 no gallops, murmurs, rubs. Peripheral pulses are full .No JVD .  ABDOMEN: Bowel sounds normal nontender  No guard or rebound, no hepato splenomegal no CVA tenderness.    llq scar no obv mass  Along incisional   Superficial bruise noted  No masses  Extremtities:  No clubbing cyanosis or edema, no acute joint swelling or redness no focal atrophy NEURO:  Oriented x3, cranial nerves 3-12 appear to be intact, no obvious focal weakness,gait within normal limits no abnormal  reflexes or asymmetrical SKIN: No acute rashes normal turgor, color, no  petechiae. PSYCH: Oriented, good eye contact, no obvious depression anxiety, cognition and judgment appear normal. LN: no cervical axillary inguinal adenopathy No noted deficits in memory, attention, and speech.   Lab Results  Component Value Date   WBC 4.8 06/20/2019   HGB 13.7 06/20/2019   HCT 41.0 06/20/2019   PLT 227.0 06/20/2019   GLUCOSE 124 (H) 06/20/2019   CHOL 138 06/20/2019   TRIG 109.0 06/20/2019   HDL 51.40 06/20/2019   LDLDIRECT 142.0 01/28/2016   LDLCALC 65 06/20/2019   ALT 28 06/20/2019   AST 20 06/20/2019   NA 140 06/20/2019   K 4.5 06/20/2019   CL 102 06/20/2019   CREATININE 0.71 06/20/2019   BUN 17 06/20/2019   CO2 30 06/20/2019   TSH 1.96 06/20/2019   INR 1.12 03/17/2017   HGBA1C 6.9 (H) 06/20/2019    ASSESSMENT AND PLAN:  Discussed the following assessment and plan:  Visit for preventive health examination  Medicare annual wellness visit, subsequent  Medication management - Plan: Basic metabolic panel, CBC with Differential/Platelet, Hemoglobin A1c, Hepatic function panel, Lipid panel, TSH  Hypothyroidism, unspecified type - Plan: Basic metabolic panel, CBC with Differential/Platelet, Hemoglobin A1c, Hepatic function panel, Lipid panel, TSH  Hyperlipidemia, unspecified hyperlipidemia type - Plan: Basic metabolic panel, CBC with Differential/Platelet, Hemoglobin A1c, Hepatic function panel, Lipid panel, TSH  Essential hypertension - Plan: Basic metabolic panel, CBC with Differential/Platelet, Hemoglobin A1c, Hepatic function panel, Lipid panel, TSH  Fasting hyperglycemia - Plan: Basic  metabolic panel, CBC with Differential/Platelet, Hemoglobin A1c, Hepatic function panel, Lipid panel, TSH  Sleep disturbance - Plan: Basic metabolic panel, CBC with Differential/Platelet, Hemoglobin A1c, Hepatic function panel, Lipid panel, TSH  Abdominal wall bulge new - ? if incisional hernia    vs weight gain  plan surgical referral  fo eval - Plan: Ambulatory referral to General Surgery  Sleep difficulties - chronic  and rls Over due for lab  Uncertain concern about covid vaccine consider consulting allergist,  Aware of her hx of anaphylaxis  mammos flu vaccine   Labs   Regular  Advised Patient Care Team: Ottie Tillery, Standley Brooking, MD as PCP - General Josue Hector, MD as PCP - Cardiology (Cardiology) Sallyanne Havers, MD as Referring Physician (Orthopedic Surgery) Lowella Bandy, MD as Attending Physician (Urology) Josue Hector, MD as Consulting Physician (Cardiology) Richmond Campbell, MD as Consulting Physician (Gastroenterology)  Patient Instructions  Will notify you  of labs when available.   Will arrange surgical referral for check poss incisional hernia ? Other/  healthy weight loss will help   Consider shingles vaccine  consider talking with allergist about the anaphylaxis response you had  With the hydralazine .    Health Maintenance, Female Adopting a healthy lifestyle and getting preventive care are important in promoting health and wellness. Ask your health care provider about:  The right schedule for you to have regular tests and exams.  Things you can do on your own to prevent diseases and keep yourself healthy. What should I know about diet, weight, and exercise? Eat a healthy diet   Eat a diet that includes plenty of vegetables, fruits, low-fat dairy products, and lean protein.  Do not eat a lot of foods that are high in solid fats, added sugars, or sodium. Maintain a healthy weight Body mass index (BMI) is used to identify weight problems. It estimates body fat based on height and weight. Your health care provider can help determine your BMI and help you achieve or maintain a healthy weight. Get regular exercise Get regular exercise. This is one of the most important things you can do for your health. Most adults should:  Exercise for at least 150 minutes  each week. The exercise should increase your heart rate and make you sweat (moderate-intensity exercise).  Do strengthening exercises at least twice a week. This is in addition to the moderate-intensity exercise.  Spend less time sitting. Even light physical activity can be beneficial. Watch cholesterol and blood lipids Have your blood tested for lipids and cholesterol at 69 years of age, then have this test every 5 years. Have your cholesterol levels checked more often if:  Your lipid or cholesterol levels are high.  You are older than 69 years of age.  You are at high risk for heart disease. What should I know about cancer screening? Depending on your health history and family history, you may need to have cancer screening at various ages. This may include screening for:  Breast cancer.  Cervical cancer.  Colorectal cancer.  Skin cancer.  Lung cancer. What should I know about heart disease, diabetes, and high blood pressure? Blood pressure and heart disease  High blood pressure causes heart disease and increases the risk of stroke. This is more likely to develop in people who have high blood pressure readings, are of African descent, or are overweight.  Have your blood pressure checked: ? Every 3-5 years if you are 8-56 years of age. ? Every year if you are  36 years old or older. Diabetes Have regular diabetes screenings. This checks your fasting blood sugar level. Have the screening done:  Once every three years after age 50 if you are at a normal weight and have a low risk for diabetes.  More often and at a younger age if you are overweight or have a high risk for diabetes. What should I know about preventing infection? Hepatitis B If you have a higher risk for hepatitis B, you should be screened for this virus. Talk with your health care provider to find out if you are at risk for hepatitis B infection. Hepatitis C Testing is recommended for:  Everyone born from  [birth year] through 1965.  Anyone with known risk factors for hepatitis C. Sexually transmitted infections (STIs)  Get screened for STIs, including gonorrhea and chlamydia, if: ? You are sexually active and are younger than 69 years of age. ? You are older than 69 years of age and your health care provider tells you that you are at risk for this type of infection. ? Your sexual activity has changed since you were last screened, and you are at increased risk for chlamydia or gonorrhea. Ask your health care provider if you are at risk.  Ask your health care provider about whether you are at high risk for HIV. Your health care provider may recommend a prescription medicine to help prevent HIV infection. If you choose to take medicine to prevent HIV, you should first get tested for HIV. You should then be tested every 3 months for as long as you are taking the medicine. Pregnancy  If you are about to stop having your period (premenopausal) and you may become pregnant, seek counseling before you get pregnant.  Take 400 to 800 micrograms (mcg) of folic acid every day if you become pregnant.  Ask for birth control (contraception) if you want to prevent pregnancy. Osteoporosis and menopause Osteoporosis is a disease in which the bones lose minerals and strength with aging. This can result in bone fractures. If you are 79 years old or older, or if you are at risk for osteoporosis and fractures, ask your health care provider if you should:  Be screened for bone loss.  Take a calcium or vitamin D supplement to lower your risk of fractures.  Be given hormone replacement therapy (HRT) to treat symptoms of menopause. Follow these instructions at home: Lifestyle  Do not use any products that contain nicotine or tobacco, such as cigarettes, e-cigarettes, and chewing tobacco. If you need help quitting, ask your health care provider.  Do not use street drugs.  Do not share needles.  Ask your health care  provider for help if you need support or information about quitting drugs. Alcohol use  Do not drink alcohol if: ? Your health care provider tells you not to drink. ? You are pregnant, may be pregnant, or are planning to become pregnant.  If you drink alcohol: ? Limit how much you use to 0-1 drink a day. ? Limit intake if you are breastfeeding.  Be aware of how much alcohol is in your drink. In the [place] one drink equals one 12 oz bottle of beer (355 mL), one 5 oz glass of wine (148 mL), or one 1 oz glass of hard liquor (44 mL). General instructions  Schedule regular health, dental, and eye exams.  Stay current with your vaccines.  Tell your health care provider if: ? You often feel depressed. ? You have ever been abused  or do not feel safe at home. Summary  Adopting a healthy lifestyle and getting preventive care are important in promoting health and wellness.  Follow your health care provider's instructions about healthy diet, exercising, and getting tested or screened for diseases.  Follow your health care provider's instructions on monitoring your cholesterol and blood pressure. This information is not intended to replace advice given to you by your health care provider. Make sure you discuss any questions you have with your health care provider. Document Revised: 01/05/2018 Document Reviewed: 01/05/2018 Elsevier Patient Education  2020 [place] Silverstein M.D.

## 2019-06-20 ENCOUNTER — Ambulatory Visit (INDEPENDENT_AMBULATORY_CARE_PROVIDER_SITE_OTHER): Payer: Medicare HMO | Admitting: Internal Medicine

## 2019-06-20 ENCOUNTER — Encounter: Payer: Self-pay | Admitting: Internal Medicine

## 2019-06-20 VITALS — BP 128/84 | HR 68 | Temp 98.0°F | Ht 60.0 in | Wt 181.6 lb

## 2019-06-20 DIAGNOSIS — I10 Essential (primary) hypertension: Secondary | ICD-10-CM | POA: Diagnosis not present

## 2019-06-20 DIAGNOSIS — E785 Hyperlipidemia, unspecified: Secondary | ICD-10-CM

## 2019-06-20 DIAGNOSIS — R7301 Impaired fasting glucose: Secondary | ICD-10-CM | POA: Diagnosis not present

## 2019-06-20 DIAGNOSIS — G479 Sleep disorder, unspecified: Secondary | ICD-10-CM

## 2019-06-20 DIAGNOSIS — E039 Hypothyroidism, unspecified: Secondary | ICD-10-CM | POA: Diagnosis not present

## 2019-06-20 DIAGNOSIS — Z79899 Other long term (current) drug therapy: Secondary | ICD-10-CM

## 2019-06-20 DIAGNOSIS — R1900 Intra-abdominal and pelvic swelling, mass and lump, unspecified site: Secondary | ICD-10-CM | POA: Diagnosis not present

## 2019-06-20 DIAGNOSIS — Z0000 Encounter for general adult medical examination without abnormal findings: Secondary | ICD-10-CM | POA: Diagnosis not present

## 2019-06-20 LAB — CBC WITH DIFFERENTIAL/PLATELET
Basophils Absolute: 0 10*3/uL (ref 0.0–0.1)
Basophils Relative: 0.6 % (ref 0.0–3.0)
Eosinophils Absolute: 0.1 10*3/uL (ref 0.0–0.7)
Eosinophils Relative: 1.4 % (ref 0.0–5.0)
HCT: 41 % (ref 36.0–46.0)
Hemoglobin: 13.7 g/dL (ref 12.0–15.0)
Lymphocytes Relative: 37.4 % (ref 12.0–46.0)
Lymphs Abs: 1.8 10*3/uL (ref 0.7–4.0)
MCHC: 33.4 g/dL (ref 30.0–36.0)
MCV: 97.9 fl (ref 78.0–100.0)
Monocytes Absolute: 0.3 10*3/uL (ref 0.1–1.0)
Monocytes Relative: 7.1 % (ref 3.0–12.0)
Neutro Abs: 2.6 10*3/uL (ref 1.4–7.7)
Neutrophils Relative %: 53.5 % (ref 43.0–77.0)
Platelets: 227 10*3/uL (ref 150.0–400.0)
RBC: 4.19 Mil/uL (ref 3.87–5.11)
RDW: 13.6 % (ref 11.5–15.5)
WBC: 4.8 10*3/uL (ref 4.0–10.5)

## 2019-06-20 LAB — BASIC METABOLIC PANEL
BUN: 17 mg/dL (ref 6–23)
CO2: 30 mEq/L (ref 19–32)
Calcium: 9.4 mg/dL (ref 8.4–10.5)
Chloride: 102 mEq/L (ref 96–112)
Creatinine, Ser: 0.71 mg/dL (ref 0.40–1.20)
GFR: 81.64 mL/min (ref >60.00)
Glucose, Bld: 124 mg/dL — ABNORMAL HIGH (ref 70–99)
Potassium: 4.5 mEq/L (ref 3.5–5.1)
Sodium: 140 mEq/L (ref 135–145)

## 2019-06-20 LAB — HEPATIC FUNCTION PANEL
ALT: 28 U/L (ref 0–35)
AST: 20 U/L (ref 0–37)
Albumin: 4.5 g/dL (ref 3.5–5.2)
Alkaline Phosphatase: 51 U/L (ref 39–117)
Bilirubin, Direct: 0.3 mg/dL (ref 0.0–0.3)
Total Bilirubin: 1.5 mg/dL — ABNORMAL HIGH (ref 0.2–1.2)
Total Protein: 6.4 g/dL (ref 6.0–8.3)

## 2019-06-20 LAB — HEMOGLOBIN A1C: Hgb A1c MFr Bld: 6.9 % — ABNORMAL HIGH (ref 4.6–6.5)

## 2019-06-20 LAB — LIPID PANEL
Cholesterol: 138 mg/dL (ref 0–200)
HDL: 51.4 mg/dL (ref >39.00)
LDL Cholesterol: 65 mg/dL (ref 0–99)
NonHDL: 86.39
Total CHOL/HDL Ratio: 3
Triglycerides: 109 mg/dL (ref 0.0–149.0)
VLDL: 21.8 mg/dL (ref 0.0–40.0)

## 2019-06-20 LAB — TSH: TSH: 1.96 u[IU]/mL (ref 0.35–4.50)

## 2019-06-20 NOTE — Patient Instructions (Addendum)
Will notify you  of labs when available.   Will arrange surgical referral for check poss incisional hernia ? Other/  healthy weight loss will help   Consider shingles vaccine  consider talking with allergist about the anaphylaxis response you had  With the hydralazine .    Health Maintenance, Female Adopting a healthy lifestyle and getting preventive care are important in promoting health and wellness. Ask your health care provider about:  The right schedule for you to have regular tests and exams.  Things you can do on your own to prevent diseases and keep yourself healthy. What should I know about diet, weight, and exercise? Eat a healthy diet   Eat a diet that includes plenty of vegetables, fruits, low-fat dairy products, and lean protein.  Do not eat a lot of foods that are high in solid fats, added sugars, or sodium. Maintain a healthy weight Body mass index (BMI) is used to identify weight problems. It estimates body fat based on height and weight. Your health care provider can help determine your BMI and help you achieve or maintain a healthy weight. Get regular exercise Get regular exercise. This is one of the most important things you can do for your health. Most adults should:  Exercise for at least 150 minutes each week. The exercise should increase your heart rate and make you sweat (moderate-intensity exercise).  Do strengthening exercises at least twice a week. This is in addition to the moderate-intensity exercise.  Spend less time sitting. Even light physical activity can be beneficial. Watch cholesterol and blood lipids Have your blood tested for lipids and cholesterol at 69 years of age, then have this test every 5 years. Have your cholesterol levels checked more often if:  Your lipid or cholesterol levels are high.  You are older than 69 years of age.  You are at high risk for heart disease. What should I know about cancer screening? Depending on your  health history and family history, you may need to have cancer screening at various ages. This may include screening for:  Breast cancer.  Cervical cancer.  Colorectal cancer.  Skin cancer.  Lung cancer. What should I know about heart disease, diabetes, and high blood pressure? Blood pressure and heart disease  High blood pressure causes heart disease and increases the risk of stroke. This is more likely to develop in people who have high blood pressure readings, are of African descent, or are overweight.  Have your blood pressure checked: ? Every 3-5 years if you are 5-89 years of age. ? Every year if you are 55 years old or older. Diabetes Have regular diabetes screenings. This checks your fasting blood sugar level. Have the screening done:  Once every three years after age 58 if you are at a normal weight and have a low risk for diabetes.  More often and at a younger age if you are overweight or have a high risk for diabetes. What should I know about preventing infection? Hepatitis B If you have a higher risk for hepatitis B, you should be screened for this virus. Talk with your health care provider to find out if you are at risk for hepatitis B infection. Hepatitis C Testing is recommended for:  Everyone born from [birth year] through 1965.  Anyone with known risk factors for hepatitis C. Sexually transmitted infections (STIs)  Get screened for STIs, including gonorrhea and chlamydia, if: ? You are sexually active and are younger than 69 years of age. ? You  are older than 69 years of age and your health care provider tells you that you are at risk for this type of infection. ? Your sexual activity has changed since you were last screened, and you are at increased risk for chlamydia or gonorrhea. Ask your health care provider if you are at risk.  Ask your health care provider about whether you are at high risk for HIV. Your health care provider may recommend a prescription  medicine to help prevent HIV infection. If you choose to take medicine to prevent HIV, you should first get tested for HIV. You should then be tested every 3 months for as long as you are taking the medicine. Pregnancy  If you are about to stop having your period (premenopausal) and you may become pregnant, seek counseling before you get pregnant.  Take 400 to 800 micrograms (mcg) of folic acid every day if you become pregnant.  Ask for birth control (contraception) if you want to prevent pregnancy. Osteoporosis and menopause Osteoporosis is a disease in which the bones lose minerals and strength with aging. This can result in bone fractures. If you are 55 years old or older, or if you are at risk for osteoporosis and fractures, ask your health care provider if you should:  Be screened for bone loss.  Take a calcium or vitamin D supplement to lower your risk of fractures.  Be given hormone replacement therapy (HRT) to treat symptoms of menopause. Follow these instructions at home: Lifestyle  Do not use any products that contain nicotine or tobacco, such as cigarettes, e-cigarettes, and chewing tobacco. If you need help quitting, ask your health care provider.  Do not use street drugs.  Do not share needles.  Ask your health care provider for help if you need support or information about quitting drugs. Alcohol use  Do not drink alcohol if: ? Your health care provider tells you not to drink. ? You are pregnant, may be pregnant, or are planning to become pregnant.  If you drink alcohol: ? Limit how much you use to 0-1 drink a day. ? Limit intake if you are breastfeeding.  Be aware of how much alcohol is in your drink. In the [place] one drink equals one 12 oz bottle of beer (355 mL), one 5 oz glass of wine (148 mL), or one 1 oz glass of hard liquor (44 mL). General instructions  Schedule regular health, dental, and eye exams.  Stay current with your vaccines.  Tell your health  care provider if: ? You often feel depressed. ? You have ever been abused or do not feel safe at home. Summary  Adopting a healthy lifestyle and getting preventive care are important in promoting health and wellness.  Follow your health care provider's instructions about healthy diet, exercising, and getting tested or screened for diseases.  Follow your health care provider's instructions on monitoring your cholesterol and blood pressure. This information is not intended to replace advice given to you by your health care provider. Make sure you discuss any questions you have with your health care provider. Document Revised: 01/05/2018 Document Reviewed: 01/05/2018 Elsevier Patient Education  2020 [employer].

## 2019-06-21 ENCOUNTER — Ambulatory Visit: Payer: Medicare HMO | Admitting: Internal Medicine

## 2019-06-22 ENCOUNTER — Other Ambulatory Visit: Payer: Self-pay

## 2019-06-22 DIAGNOSIS — R7309 Other abnormal glucose: Secondary | ICD-10-CM

## 2019-06-22 MED ORDER — METFORMIN HCL ER 500 MG PO TB24: 500.0000 mg | ORAL_TABLET | Freq: Every day | ORAL | 1 refills | Status: DC

## 2019-06-22 NOTE — Progress Notes (Signed)
Blood sugar is now in the early diabetes range    rest is stable of normal  In addition to lifestyle   diet changes and weight loss  We can add  metformin er or reg 500 mg 1 po qd  with meal disp 90  refill x 1   And plan rov in about 3-4  mos  and recheck hg a1c at that time . Let us know if want a nutrition referral but can get most advice on line from [facility] ,[facility] diabetes sites and  links

## 2019-06-30 ENCOUNTER — Other Ambulatory Visit: Payer: Self-pay | Admitting: Internal Medicine

## 2019-07-07 ENCOUNTER — Other Ambulatory Visit: Payer: Self-pay | Admitting: Internal Medicine

## 2019-07-26 ENCOUNTER — Telehealth: Payer: Self-pay | Admitting: Internal Medicine

## 2019-07-26 NOTE — Telephone Encounter (Signed)
stomach surgery in 2019 notes given to [facility]

## 2019-07-29 ENCOUNTER — Other Ambulatory Visit: Payer: Self-pay | Admitting: Internal Medicine

## 2019-08-03 ENCOUNTER — Other Ambulatory Visit: Payer: Self-pay

## 2019-08-03 MED ORDER — OLMESARTAN MEDOXOMIL 20 MG PO TABS: ORAL_TABLET | ORAL | 1 refills | Status: DC

## 2019-08-07 ENCOUNTER — Other Ambulatory Visit: Payer: Self-pay | Admitting: Surgery

## 2019-08-07 DIAGNOSIS — R1904 Left lower quadrant abdominal swelling, mass and lump: Secondary | ICD-10-CM

## 2019-08-21 ENCOUNTER — Ambulatory Visit
Admission: RE | Admit: 2019-08-21 | Discharge: 2019-08-21 | Disposition: A | Discharge status: Home / Self Care | Payer: Medicare Other | Source: Ambulatory Visit | Attending: Surgery | Admitting: Surgery

## 2019-08-21 ENCOUNTER — Other Ambulatory Visit: Payer: Self-pay

## 2019-08-21 DIAGNOSIS — R1904 Left lower quadrant abdominal swelling, mass and lump: Secondary | ICD-10-CM

## 2019-08-21 MED ORDER — IOPAMIDOL (ISOVUE-300) INJECTION 61%
100.0000 mL | Freq: Once | INTRAVENOUS | Status: AC | PRN
  Administered 2019-08-21: 100 mL via INTRAVENOUS

## 2019-09-25 ENCOUNTER — Other Ambulatory Visit: Payer: Self-pay

## 2019-09-25 NOTE — Progress Notes (Signed)
Chief Complaint  Patient presents with  . Diabetes    Doing well    HPI: [place] y.o. come in for fu elevated  a1c dm early  Since last day has not taken the metformin yet   Has ?s  Used to  Drink  3-4 per day sugar  Drinks and has cut these outs.  As well as other   Not as physically active cause clients are not as mobile  bplans on going back to weight watchers  Now  Although scared from  Reaction from hydralazine  Will plkan on getting vaccine   . Children are pregnancy   abd wall eval   Every thing fine and no  Hernia or problem otherwise  ROS: See pertinent positives and negatives per HPI.  Past Medical History:  Diagnosis Date  . Acute pyelonephritis 05/02/2012  . Asthma   . Diverticulitis 03/24/2017   see report [facility]  . Dyspnea    nl PFTs, and echo  . GERD (gastroesophageal reflux disease)   . Headache(784.0)   . Heart murmur   . History of kidney stones   . IBS (irritable bowel syndrome)   . Nephrolithiasis   . Pre-diabetes 12/15  . Recurrent oral ulcers   . Thyroid goiter    I 131 rx and then  total throidectomy 2005 [facility]    Family History  Problem Relation Age of Onset  . COPD Sister   . Coronary artery disease Sister        age 80 also copd  . Cancer Mother        bladder  . Arthritis Other   . Cancer Other        breast  . Diabetes Other   . Hyperlipidemia Other   . Hypertension Other   . Stroke Other   . Heart disease Other   . Coronary artery disease Father        also had AAA  . Aneurysm Father        Aortic and thoracic fatal    Social History   Socioeconomic History  . Marital status: Married    Spouse name: Not on file  . Number of children: Not on file  . Years of education: Not on file  . Highest education level: Not on file  Occupational History  . Not on file  Tobacco Use  . Smoking status: Never Smoker  . Smokeless tobacco: Never Used  Vaping Use  . Vaping Use: Never used  Substance  and Sexual Activity  . Alcohol use: Yes    Comment: seldom  . Drug use: No  . Sexual activity: Not on file  Other Topics Concern  . Not on file  Social History Narrative   Married   Has grandchildren   Never smoked    G4P4   hh of 2  care of GC ages 27 - 23 month  5 days a week   Social Determinants of Radio broadcast assistant Strain:   . Difficulty of Paying Living Expenses: Not on file  Food Insecurity:   . Worried About Charity fundraiser in the Last Year: Not on file  . Ran Out of Food in the Last Year: Not on file  Transportation Needs:   . Lack of Transportation (Medical): Not on file  . Lack of Transportation (Non-Medical): Not on file  Physical Activity:   . Days of Exercise per Week: Not on file  . Minutes of  Exercise per Session: Not on file  Stress:   . Feeling of Stress : Not on file  Social Connections:   . Frequency of Communication with Friends and Family: Not on file  . Frequency of Social Gatherings with Friends and Family: Not on file  . Attends Religious Services: Not on file  . Active Member of Clubs or Organizations: Not on file  . Attends Archivist Meetings: Not on file  . Marital Status: Not on file    Outpatient Medications Prior to Visit  Medication Sig Dispense Refill  . acetaminophen (TYLENOL) 500 MG tablet Take 1,000 mg by mouth 3 (three) times daily.     . bisacodyl (DULCOLAX) 5 MG EC tablet Take 1 tablet (5 mg total) by mouth daily as needed for moderate constipation. 30 tablet 0  . docusate sodium (COLACE) 100 MG capsule Take 1 capsule (100 mg total) by mouth daily as needed for mild constipation. 10 capsule 0  . olmesartan (BENICAR) 20 MG tablet TAKE ONE TABLET BY MOUTH DAILY 90 tablet 1  . pantoprazole (PROTONIX) 40 MG tablet TAKE ONE TABLET BY MOUTH TWICE A DAY 180 tablet 0  . rOPINIRole (REQUIP) 0.5 MG tablet TAKE ONE TO TWO TABLETS BY MOUTH AT NOON, 5PM, AND THEN TAKE THREE TABLETS BY MOUTH EVERY NIGHT AT BEDTIME 540 tablet  0  . rosuvastatin (CRESTOR) 10 MG tablet TAKE ONE TABLET BY MOUTH DAILY 90 tablet 0  . SYNTHROID 112 MCG tablet TAKE ONE TABLET BY MOUTH EVERY MORNING BEFORE BREAKFAST 90 tablet 0  . metFORMIN (GLUCOPHAGE-XR) 500 MG 24 hr tablet Take 1 tablet (500 mg total) by mouth daily with breakfast. (Patient not taking: Reported on 09/26/2019) 90 tablet 1  . methocarbamol (ROBAXIN) 500 MG tablet Take 1-2 tablets (500-1,000 mg total) by mouth every 8 (eight) hours as needed for muscle spasms. (Patient not taking: Reported on 09/26/2019) 60 tablet 0   No facility-administered medications prior to visit.     EXAM:  BP 128/74   Pulse 72   Temp 98.1 F (36.7 C) (Oral)   Ht 5' (1.524 m)   Wt 183 lb 3.2 oz (83.1 kg)   SpO2 96%   BMI 35.78 kg/m   Body mass index is 35.78 kg/m. Wt Readings from Last 3 Encounters:  09/26/19 183 lb 3.2 oz (83.1 kg)  06/20/19 181 lb 9.6 oz (82.4 kg)  02/07/18 182 lb (82.6 kg)    GENERAL: vitals reviewed and listed above, alert, oriented, appears well hydrated and in no acute distress HEENT: atraumatic, conjunctiva  clear, no obvious abnormalities on inspection of external nose and ears OP :masked NECK: no obvious masses on inspection palpation  LUNGS: clear to auscultation bilaterally, no wheezes, rales or rhonchi, good air movement CV: HRRR, no clubbing cyanosis or  peripheral edema nl cap refill  MS: moves all extremities without noticeable focal  abnormality PSYCH: pleasant and cooperative, no obvious depression or anxiety Lab Results  Component Value Date   WBC 4.8 06/20/2019   HGB 13.7 06/20/2019   HCT 41.0 06/20/2019   PLT 227.0 06/20/2019   GLUCOSE 124 (H) 06/20/2019   CHOL 138 06/20/2019   TRIG 109.0 06/20/2019   HDL 51.40 06/20/2019   LDLDIRECT 142.0 01/28/2016   LDLCALC 65 06/20/2019   ALT 28 06/20/2019   AST 20 06/20/2019   NA 140 06/20/2019   K 4.5 06/20/2019   CL 102 06/20/2019   CREATININE 0.71 06/20/2019   BUN 17 06/20/2019   CO2 30  06/20/2019   TSH 1.96 06/20/2019   INR 1.12 03/17/2017   HGBA1C 6.5 (A) 09/26/2019   BP Readings from Last 3 Encounters:  09/26/19 128/74  06/20/19 128/84  02/01/18 130/84    ASSESSMENT AND PLAN:  Discussed the following assessment and plan:  Type 2 diabetes mellitus with hyperglycemia, without long-term current use of insulin (HCC)  Elevated hemoglobin A1c - Plan: POCT glycosylated hemoglobin (Hb A1C)  Counseled about COVID-19 virus infection Disc  Risk bnefit of metformin and she may try this in addition to lsi    She consider getting dm educator but reviewed and weight watcher may again be helpful   Avoiding  Simple crbs ex her crackers and  No continued  Sugar beverages   Disc covid vaccine and she will proceed  Disease is worse than any vaccine se known at this time Plan 4 mos rov and fu a1c etc  Let us know if she wants uys to refer for  diabets education diet etc  -Patient advised to return or notify health care team  if  new concerns arise. 34 minutes review  Assess and counseling  Patient Instructions   Agree getting the vaccine  As discussed .   a1c is better but  Still may benefit from  Metformin once a day .     Preventing Type 2 Diabetes Mellitus Type 2 diabetes (type 2 diabetes mellitus) is a long-term (chronic) disease that affects blood sugar (glucose) levels. Normally, a hormone called insulin allows glucose to enter cells in the body. The cells use glucose for energy. In type 2 diabetes, one or both of these problems may be present:  The body does not make enough insulin.  The body does not respond properly to insulin that it makes (insulin resistance). Insulin resistance or lack of insulin causes excess glucose to build up in the blood instead of going into cells. As a result, high blood glucose (hyperglycemia) develops, which can cause many complications. Being overweight or obese and having an inactive (sedentary) lifestyle can increase your risk for  diabetes. Type 2 diabetes can be delayed or prevented by making certain nutrition and lifestyle changes. What nutrition changes can be made?   Eat healthy meals and snacks regularly. Keep a healthy snack with you for when you get hungry between meals, such as fruit or a handful of nuts.  Eat lean meats and proteins that are low in saturated fats, such as chicken, fish, egg whites, and beans. Avoid processed meats.  Eat plenty of fruits and vegetables and plenty of grains that have not been processed (whole grains). It is recommended that you eat: ? 1?2 cups of fruit every day. ? 2?3 cups of vegetables every day. ? 6?8 oz of whole grains every day, such as oats, whole wheat, bulgur, brown rice, quinoa, and millet.  Eat low-fat dairy products, such as milk, yogurt, and cheese.  Eat foods that contain healthy fats, such as nuts, avocado, olive oil, and canola oil.  Drink water throughout the day. Avoid drinks that contain added sugar, such as soda or sweet tea.  Follow instructions from your health care provider about specific eating or drinking restrictions.  Control how much food you eat at a time (portion size). ? Check food labels to find out the serving sizes of foods. ? Use a kitchen scale to weigh amounts of foods.  Saute or steam food instead of frying it. Cook with water or broth instead of oils or butter.  Limit your  intake of: ? Salt (sodium). Have no more than 1 tsp (2,400 mg) of sodium a day. If you have heart disease or high blood pressure, have less than ? tsp (1,500 mg) of sodium a day. ? Saturated fat. This is fat that is solid at room temperature, such as butter or fat on meat. What lifestyle changes can be made? Activity   Do moderate-intensity physical activity for at least 30 minutes on at least 5 days of the week, or as much as told by your health care provider.  Ask your health care provider what activities are safe for you. A mix of physical activities may  be best, such as walking, swimming, cycling, and strength training.  Try to add physical activity into your day. For example: ? Park in spots that are farther away than usual, so that you walk more. For example, park in a far corner of the parking lot when you go to the office or the grocery store. ? Take a walk during your lunch break. ? Use stairs instead of elevators or escalators. Weight Loss  Lose weight as directed. Your health care provider can determine how much weight loss is best for you and can help you lose weight safely.  If you are overweight or obese, you may be instructed to lose at least 5?7 % of your body weight. Alcohol and Tobacco   Limit alcohol intake to no more than 1 drink a day for nonpregnant women and 2 drinks a day for men. One drink equals 12 oz of beer, 5 oz of wine, or 1 oz of hard liquor.  Do not use any tobacco products, such as cigarettes, chewing tobacco, and e-cigarettes. If you need help quitting, ask your health care provider. Work With [facility] Provider  Have your blood glucose tested regularly, as told by your health care provider.  Discuss your risk factors and how you can reduce your risk for diabetes.  Get screening tests as told by your health care provider. You may have screening tests regularly, especially if you have certain risk factors for type 2 diabetes.  Make an appointment with a diet and nutrition specialist (registered dietitian). A registered dietitian can help you make a healthy eating plan and can help you understand portion sizes and food labels. Why are these changes important?  It is possible to prevent or delay type 2 diabetes and related health problems by making lifestyle and nutrition changes.  It can be difficult to recognize signs of type 2 diabetes. The best way to avoid possible damage to your body is to take actions to prevent the disease before you develop symptoms. What can happen if changes are not  made?  Your blood glucose levels may keep increasing. Having high blood glucose for a long time is dangerous. Too much glucose in your blood can damage your blood vessels, heart, kidneys, nerves, and eyes.  You may develop prediabetes or type 2 diabetes. Type 2 diabetes can lead to many chronic health problems and complications, such as: ? Heart disease. ? Stroke. ? Blindness. ? Kidney disease. ? Depression. ? Poor circulation in the feet and legs, which could lead to surgical removal (amputation) in severe cases. Where to find support  Ask your health care provider to recommend a registered dietitian, diabetes educator, or weight loss program.  Look for local or online weight loss groups.  Join a gym, fitness club, or outdoor activity group, such as a walking club. Where to find more  information To learn more about diabetes and diabetes prevention, visit:  American Diabetes Association (ADA): www.diabetes.[employer] of Diabetes and Digestive and Kidney Diseases: FindSpin.nl To learn more about healthy eating, visit:  The U.S. Department of Agriculture Scientist, research (physical sciences)), Choose My Plate: http://wiley-williams.com/  Office of Disease Prevention and Health Promotion (ODPHP), Dietary Guidelines: SurferLive.at Summary  You can reduce your risk for type 2 diabetes by increasing your physical activity, eating healthy foods, and losing weight as directed.  Talk with your health care provider about your risk for type 2 diabetes. Ask about any blood tests or screening tests that you need to have. This information is not intended to replace advice given to you by your health care provider. Make sure you discuss any questions you have with your health care provider. Document Revised: 05/06/2018 Document Reviewed: 03/05/2015 Elsevier Patient Education  2020 [place] Yazhini Mcaulay M.D.

## 2019-09-26 ENCOUNTER — Ambulatory Visit (INDEPENDENT_AMBULATORY_CARE_PROVIDER_SITE_OTHER): Payer: Medicare Other | Admitting: Internal Medicine

## 2019-09-26 ENCOUNTER — Encounter: Payer: Self-pay | Admitting: Internal Medicine

## 2019-09-26 VITALS — BP 128/74 | HR 72 | Temp 98.1°F | Ht 60.0 in | Wt 183.2 lb

## 2019-09-26 DIAGNOSIS — E1165 Type 2 diabetes mellitus with hyperglycemia: Secondary | ICD-10-CM | POA: Diagnosis not present

## 2019-09-26 DIAGNOSIS — Z7189 Other specified counseling: Secondary | ICD-10-CM

## 2019-09-26 DIAGNOSIS — R7309 Other abnormal glucose: Secondary | ICD-10-CM | POA: Diagnosis not present

## 2019-09-26 LAB — POCT GLYCOSYLATED HEMOGLOBIN (HGB A1C): Hemoglobin A1C: 6.5 % — AB (ref 4.0–5.6)

## 2019-09-26 NOTE — Patient Instructions (Signed)
Agree getting the vaccine  As discussed .   a1c is better but  Still may benefit from  Metformin once a day .     Preventing Type 2 Diabetes Mellitus Type 2 diabetes (type 2 diabetes mellitus) is a long-term (chronic) disease that affects blood sugar (glucose) levels. Normally, a hormone called insulin allows glucose to enter cells in the body. The cells use glucose for energy. In type 2 diabetes, one or both of these problems may be present:  The body does not make enough insulin.  The body does not respond properly to insulin that it makes (insulin resistance). Insulin resistance or lack of insulin causes excess glucose to build up in the blood instead of going into cells. As a result, high blood glucose (hyperglycemia) develops, which can cause many complications. Being overweight or obese and having an inactive (sedentary) lifestyle can increase your risk for diabetes. Type 2 diabetes can be delayed or prevented by making certain nutrition and lifestyle changes. What nutrition changes can be made?   Eat healthy meals and snacks regularly. Keep a healthy snack with you for when you get hungry between meals, such as fruit or a handful of nuts.  Eat lean meats and proteins that are low in saturated fats, such as chicken, fish, egg whites, and beans. Avoid processed meats.  Eat plenty of fruits and vegetables and plenty of grains that have not been processed (whole grains). It is recommended that you eat: ? 1?2 cups of fruit every day. ? 2?3 cups of vegetables every day. ? 6?8 oz of whole grains every day, such as oats, whole wheat, bulgur, brown rice, quinoa, and millet.  Eat low-fat dairy products, such as milk, yogurt, and cheese.  Eat foods that contain healthy fats, such as nuts, avocado, olive oil, and canola oil.  Drink water throughout the day. Avoid drinks that contain added sugar, such as soda or sweet tea.  Follow instructions from your health care provider about  specific eating or drinking restrictions.  Control how much food you eat at a time (portion size). ? Check food labels to find out the serving sizes of foods. ? Use a kitchen scale to weigh amounts of foods.  Saute or steam food instead of frying it. Cook with water or broth instead of oils or butter.  Limit your intake of: ? Salt (sodium). Have no more than 1 tsp (2,400 mg) of sodium a day. If you have heart disease or high blood pressure, have less than ? tsp (1,500 mg) of sodium a day. ? Saturated fat. This is fat that is solid at room temperature, such as butter or fat on meat. What lifestyle changes can be made? Activity   Do moderate-intensity physical activity for at least 30 minutes on at least 5 days of the week, or as much as told by your health care provider.  Ask your health care provider what activities are safe for you. A mix of physical activities may be best, such as walking, swimming, cycling, and strength training.  Try to add physical activity into your day. For example: ? Park in spots that are farther away than usual, so that you walk more. For example, park in a far corner of the parking lot when you go to the office or the grocery store. ? Take a walk during your lunch break. ? Use stairs instead of elevators or escalators. Weight Loss  Lose weight as directed. Your health care provider can determine how much weight  loss is best for you and can help you lose weight safely.  If you are overweight or obese, you may be instructed to lose at least 5?7 % of your body weight. Alcohol and Tobacco   Limit alcohol intake to no more than 1 drink a day for nonpregnant women and 2 drinks a day for men. One drink equals 12 oz of beer, 5 oz of wine, or 1 oz of hard liquor.  Do not use any tobacco products, such as cigarettes, chewing tobacco, and e-cigarettes. If you need help quitting, ask your health care provider. Work With [facility] Provider  Have your blood  glucose tested regularly, as told by your health care provider.  Discuss your risk factors and how you can reduce your risk for diabetes.  Get screening tests as told by your health care provider. You may have screening tests regularly, especially if you have certain risk factors for type 2 diabetes.  Make an appointment with a diet and nutrition specialist (registered dietitian). A registered dietitian can help you make a healthy eating plan and can help you understand portion sizes and food labels. Why are these changes important?  It is possible to prevent or delay type 2 diabetes and related health problems by making lifestyle and nutrition changes.  It can be difficult to recognize signs of type 2 diabetes. The best way to avoid possible damage to your body is to take actions to prevent the disease before you develop symptoms. What can happen if changes are not made?  Your blood glucose levels may keep increasing. Having high blood glucose for a long time is dangerous. Too much glucose in your blood can damage your blood vessels, heart, kidneys, nerves, and eyes.  You may develop prediabetes or type 2 diabetes. Type 2 diabetes can lead to many chronic health problems and complications, such as: ? Heart disease. ? Stroke. ? Blindness. ? Kidney disease. ? Depression. ? Poor circulation in the feet and legs, which could lead to surgical removal (amputation) in severe cases. Where to find support  Ask your health care provider to recommend a registered dietitian, diabetes educator, or weight loss program.  Look for local or online weight loss groups.  Join a gym, fitness club, or outdoor activity group, such as a walking club. Where to find more information To learn more about diabetes and diabetes prevention, visit:  American Diabetes Association (ADA): www.diabetes.[employer] of Diabetes and Digestive and Kidney Diseases:  FindSpin.nl To learn more about healthy eating, visit:  The U.S. Department of Agriculture Scientist, research (physical sciences)), Choose My Plate: http://wiley-williams.com/  Office of Disease Prevention and Health Promotion (ODPHP), Dietary Guidelines: SurferLive.at Summary  You can reduce your risk for type 2 diabetes by increasing your physical activity, eating healthy foods, and losing weight as directed.  Talk with your health care provider about your risk for type 2 diabetes. Ask about any blood tests or screening tests that you need to have. This information is not intended to replace advice given to you by your health care provider. Make sure you discuss any questions you have with your health care provider. Document Revised: 05/06/2018 Document Reviewed: 03/05/2015 Elsevier Patient Education  [name].

## 2019-10-29 ENCOUNTER — Other Ambulatory Visit: Payer: Self-pay | Admitting: Internal Medicine

## 2019-11-06 ENCOUNTER — Encounter: Payer: Self-pay | Admitting: Internal Medicine

## 2019-11-06 ENCOUNTER — Telehealth (INDEPENDENT_AMBULATORY_CARE_PROVIDER_SITE_OTHER): Payer: Medicare Other | Admitting: Internal Medicine

## 2019-11-06 VITALS — Ht 60.0 in | Wt 175.0 lb

## 2019-11-06 DIAGNOSIS — J069 Acute upper respiratory infection, unspecified: Secondary | ICD-10-CM | POA: Diagnosis not present

## 2019-11-06 NOTE — Progress Notes (Signed)
Virtual Visit via Video Note  I connected with@ on 11/06/19 at  3:00 PM EDT by a video enabled telemedicine application and verified that I am speaking with the correct person using two identifiers. Location patient: home Location provider:work office Persons participating in the virtual visit: patient, provider  [place] national recommendations  regarding COVID 19 pandemic   video visit is advised over in office visit for this patient.  Patient aware  of the limitations of evaluation and management by telemedicine and  availability of in person appointments. and agreed to proceed.   HPI: [name] presents for video visit for 3 days of upper respiratory congestion cough and now a scratchy sore throat began with a runny nose and then sinus drainage and then a sore throat.  Now cough but no shortness of breath hemoptysis fever chills or chest pain. Her daughters and grandkids have had similar illnesses and she has been exposed they have tested negative for Covid 1 was pregnant was given an antibiotic the rest are resolving.  She has not yet had her flu vaccine nor her Covid vaccine implant on this but got sick in the meantime. Tends to get sinus problem every year reported.  ROS: See pertinent positives and negatives per HPI.  Past Medical History:  Diagnosis Date  . Acute pyelonephritis 05/02/2012  . Asthma   . Diverticulitis 03/24/2017   [facility] report [facility]  . Dyspnea    nl PFTs, and echo  . GERD (gastroesophageal reflux disease)   . Headache(784.0)   . Heart murmur   . History of kidney stones   . IBS (irritable bowel syndrome)   . Nephrolithiasis   . Pre-diabetes 12/15  . Recurrent oral ulcers   . Thyroid goiter    I 131 rx and then  total throidectomy 2005 [facility]    Past Surgical History:  Procedure Laterality Date  . ABDOMINAL HYSTERECTOMY  1994   fibroid  . APPENDECTOMY  1968  . CYSTOSCOPY WITH STENT PLACEMENT Bilateral 03/24/2017   Procedure:  BILATERAL URETERAL CATHETER  PLACEMENT;  Surgeon: Alexis Frock, MD;  Location: [facility];  Service: Urology;  Laterality: Bilateral;  . CYSTOSCOPY/RETROGRADE/URETEROSCOPY  03/24/2017   Procedure: CYSTOSCOPY/RETROGRADE/URETEROSCOPY;  Surgeon: Alexis Frock, MD;  Location: [facility];  Service: Urology;;  . LAPAROSCOPIC PARTIAL COLECTOMY Left 03/24/2017   Procedure: LAPAROSCOPIC ASSISTED LEFT  SIGMOID COLECTOMY;  Surgeon: Alphonsa Overall, MD;  Location: [facility];  Service: General;  Laterality: Left;  . orif left tivial plateau fracture     Dr. Collier Salina 2/08  . plates and pins take out of left knee  06/2008  . SIGMOIDOSCOPY  03/24/2017   Procedure: SIGMOIDOSCOPY;  Surgeon: Alphonsa Overall, MD;  Location: [facility];  Service: General;;  . TONSILLECTOMY  1979  . TOTAL THYROIDECTOMY  2005   for  large nodule 2006    Family History  Problem Relation Age of Onset  . COPD Sister   . Coronary artery disease Sister        age 18 also copd  . Cancer Mother        bladder  . Arthritis Other   . Cancer Other        breast  . Diabetes Other   . Hyperlipidemia Other   . Hypertension Other   . Stroke Other   . Heart disease Other   . Coronary artery disease Father        also had AAA  . Aneurysm Father  Aortic and thoracic fatal    Social History   Tobacco Use  . Smoking status: Never Smoker  . Smokeless tobacco: Never Used  Vaping Use  . Vaping Use: Never used  Substance Use Topics  . Alcohol use: Yes    Comment: seldom  . Drug use: No      Current Outpatient Medications:  .  acetaminophen (TYLENOL) 500 MG tablet, Take 1,000 mg by mouth 3 (three) times daily. , Disp: , Rfl:  .  bisacodyl (DULCOLAX) 5 MG EC tablet, Take 1 tablet (5 mg total) by mouth daily as needed for moderate constipation., Disp: 30 tablet, Rfl: 0 .  docusate sodium (COLACE) 100 MG capsule, Take 1 capsule (100 mg total) by mouth daily as needed for mild constipation., Disp: 10 capsule, Rfl: 0 .  olmesartan  (BENICAR) 20 MG tablet, TAKE ONE TABLET BY MOUTH DAILY, Disp: 90 tablet, Rfl: 1 .  pantoprazole (PROTONIX) 40 MG tablet, TAKE ONE TABLET BY MOUTH TWICE A DAY, Disp: 180 tablet, Rfl: 0 .  rOPINIRole (REQUIP) 0.5 MG tablet, TAKE ONE TO TWO TABLETS BY MOUTH AT NOON, 5PM, AND THEN TAKE THREE TABLETS BY MOUTH EVERY NIGHT AT BEDTIME, Disp: 540 tablet, Rfl: 0 .  rosuvastatin (CRESTOR) 10 MG tablet, TAKE ONE TABLET BY MOUTH DAILY, Disp: 90 tablet, Rfl: 0 .  SYNTHROID 112 MCG tablet, TAKE ONE TABLET BY MOUTH EVERY MORNING BEFORE BREAKFAST, Disp: 90 tablet, Rfl: 0 .  metFORMIN (GLUCOPHAGE-XR) 500 MG 24 hr tablet, Take 1 tablet (500 mg total) by mouth daily with breakfast. (Patient not taking: Reported on 09/26/2019), Disp: 90 tablet, Rfl: 1 .  methocarbamol (ROBAXIN) 500 MG tablet, Take 1-2 tablets (500-1,000 mg total) by mouth every 8 (eight) hours as needed for muscle spasms. (Patient not taking: Reported on 09/26/2019), Disp: 60 tablet, Rfl: 0  EXAM: BP Readings from Last 3 Encounters:  09/26/19 128/74  06/20/19 128/84  02/01/18 130/84    VITALS per patient if applicable:  GENERAL: alert, oriented, appears well and in no acute distress nontoxic mild upper respiratory congestion intermittent coughing during visit good color  HEENT: atraumatic, conjunttiva clear, no obvious abnormalities on inspection of external nose and ears  NECK: normal movements of the head and neck  LUNGS: on inspection no signs of respiratory distress, breathing rate appears normal, no obvious gross SOB, gasping or wheezing  CV: no obvious cyanosis  PSYCH/NEURO: pleasant and cooperative, no obvious depression or anxiety, speech and thought processing grossly intact Lab Results  Component Value Date   WBC 4.8 06/20/2019   HGB 13.7 06/20/2019   HCT 41.0 06/20/2019   PLT 227.0 06/20/2019   GLUCOSE 124 (H) 06/20/2019   CHOL 138 06/20/2019   TRIG 109.0 06/20/2019   HDL 51.40 06/20/2019   LDLDIRECT 142.0 01/28/2016    LDLCALC 65 06/20/2019   ALT 28 06/20/2019   AST 20 06/20/2019   NA 140 06/20/2019   K 4.5 06/20/2019   CL 102 06/20/2019   CREATININE 0.71 06/20/2019   BUN 17 06/20/2019   CO2 30 06/20/2019   TSH 1.96 06/20/2019   INR 1.12 03/17/2017   HGBA1C 6.5 (A) 09/26/2019    ASSESSMENT AND PLAN:  Discussed the following assessment and plan:    ICD-10-CM   1. Viral upper respiratory tract infection with cough  J06.9    Acute respiratory infection with cough most likely viral based on context presentation and early in the illness.  Symptomatic treatment expect sore throat to improve in the next few  days.  Albeit everyone around her was tested for Covid she may consider getting tested because interventions would be appropriate for her and she also works with clients. Reviewed signs of bacterial infection or relapsing such as localized pain fever chills shortness of breath hemoptysis etc. Counseled.  Advise get covid vaccine and flu vaccine  As she has planned   Expectant management and discussion of plan and treatment with opportunity to ask questions and all were answered. The patient agreed with the plan and demonstrated an understanding of the instructions.   Advised to call back or seek an in-person evaluation if worsening  or having  further concerns . Return if symptoms worsen or fail to improve  as expected.   Or alarm sx     Shanon Ace, MD

## 2020-01-17 NOTE — Progress Notes (Signed)
Virtual Visit via Video Note  I connected with@ on 12 23 21  at  8:30 AM EST by a video enabled telemedicine application and verified that I am speaking with the correct person using two identifiers. Location patient: home Location provider: home office Persons participating in the virtual visit: patient, provider  [place] national recommendations  regarding COVID 19 pandemic   video visit is advised over in office visit for this patient.  Patient aware  of the limitations of evaluation and management by telemedicine and  availability of in person appointments. and agreed to proceed.   HPI: [name] presents for video visit for upper year respiratory symptoms. Onset about 5 to 6 days ago of head congestion with very stuffy nose thinking she "has a sinus infection" coughing up phlegm but no deep cough respiratory distress fever chest pain.  There is no face pain.  No loss of taste and smell. Her grandson also had similar symptoms is getting better was put on steroids for a "sinus infection". No specific exposure to Covid her daughter is positive for Covid recently who is pregnant but she has not been in contact. Only other contact was attending a funeral a week ago. She got a Covid test yesterday but does not yet have the results. She is not vaccinated at this time because of concern of a severe allergic reaction for injectable hydralazine when she was in the hospital.  She is pretty isolated stay at home otherwise.    ROS: See pertinent positives and negatives per HPI.  Past Medical History:  Diagnosis Date  . Acute pyelonephritis 05/02/2012  . Asthma   . Diverticulitis 03/24/2017   [facility]  . Dyspnea    nl PFTs, and echo  . GERD (gastroesophageal reflux disease)   . Headache(784.0)   . Heart murmur   . History of kidney stones   . IBS (irritable bowel syndrome)   . Nephrolithiasis   . Pre-diabetes 12/15  . Recurrent oral ulcers   . Thyroid  goiter    I 131 rx and then  total throidectomy 2005 [facility]    Past Surgical History:  Procedure Laterality Date  . ABDOMINAL HYSTERECTOMY  1994   fibroid  . APPENDECTOMY  1968  . CYSTOSCOPY WITH STENT PLACEMENT Bilateral 03/24/2017   Procedure: BILATERAL URETERAL CATHETER  PLACEMENT;  Surgeon: Alexis Frock, MD;  Location: [facility];  Service: Urology;  Laterality: Bilateral;  . CYSTOSCOPY/RETROGRADE/URETEROSCOPY  03/24/2017   Procedure: CYSTOSCOPY/RETROGRADE/URETEROSCOPY;  Surgeon: Alexis Frock, MD;  Location: [facility];  Service: Urology;;  . LAPAROSCOPIC PARTIAL COLECTOMY Left 03/24/2017   Procedure: LAPAROSCOPIC ASSISTED LEFT  SIGMOID COLECTOMY;  Surgeon: Alphonsa Overall, MD;  Location: [facility];  Service: General;  Laterality: Left;  . orif left tivial plateau fracture     Dr. Collier Salina 2/08  . plates and pins take out of left knee  06/2008  . SIGMOIDOSCOPY  03/24/2017   Procedure: SIGMOIDOSCOPY;  Surgeon: Alphonsa Overall, MD;  Location: [facility];  Service: General;;  . TONSILLECTOMY  1979  . TOTAL THYROIDECTOMY  2005   for  large nodule 2006    Family History  Problem Relation Age of Onset  . COPD Sister   . Coronary artery disease Sister        age 44 also copd  . Cancer Mother        bladder  . Arthritis Other   . Cancer Other        breast  . Diabetes Other   .  Hyperlipidemia Other   . Hypertension Other   . Stroke Other   . Heart disease Other   . Coronary artery disease Father        also had AAA  . Aneurysm Father        Aortic and thoracic fatal    Social History   Tobacco Use  . Smoking status: Never Smoker  . Smokeless tobacco: Never Used  Vaping Use  . Vaping Use: Never used  Substance Use Topics  . Alcohol use: Yes    Comment: seldom  . Drug use: No      Current Outpatient Medications:  .  acetaminophen (TYLENOL) 500 MG tablet, Take 1,000 mg by mouth 3 (three) times daily., Disp: , Rfl:  .  metFORMIN (GLUCOPHAGE-XR) 500 MG 24 hr tablet, Take 1  tablet (500 mg total) by mouth daily with breakfast., Disp: 90 tablet, Rfl: 1 .  methocarbamol (ROBAXIN) 500 MG tablet, Take 1-2 tablets (500-1,000 mg total) by mouth every 8 (eight) hours as needed for muscle spasms., Disp: 60 tablet, Rfl: 0 .  olmesartan (BENICAR) 20 MG tablet, TAKE ONE TABLET BY MOUTH DAILY, Disp: 90 tablet, Rfl: 1 .  pantoprazole (PROTONIX) 40 MG tablet, TAKE ONE TABLET BY MOUTH TWICE A DAY, Disp: 180 tablet, Rfl: 0 .  rOPINIRole (REQUIP) 0.5 MG tablet, TAKE ONE TO TWO TABLETS BY MOUTH AT NOON, 5PM, AND THEN TAKE THREE TABLETS BY MOUTH EVERY NIGHT AT BEDTIME, Disp: 540 tablet, Rfl: 0 .  rosuvastatin (CRESTOR) 10 MG tablet, TAKE ONE TABLET BY MOUTH DAILY, Disp: 90 tablet, Rfl: 0 .  SYNTHROID 112 MCG tablet, TAKE ONE TABLET BY MOUTH EVERY MORNING BEFORE BREAKFAST, Disp: 90 tablet, Rfl: 0 .  bisacodyl (DULCOLAX) 5 MG EC tablet, Take 1 tablet (5 mg total) by mouth daily as needed for moderate constipation. (Patient not taking: Reported on 01/18/2020), Disp: 30 tablet, Rfl: 0 .  docusate sodium (COLACE) 100 MG capsule, Take 1 capsule (100 mg total) by mouth daily as needed for mild constipation. (Patient not taking: Reported on 01/18/2020), Disp: 10 capsule, Rfl: 0 .  doxycycline (VIBRA-TABS) 100 MG tablet, Take 1 tablet (100 mg total) by mouth 2 (two) times daily. If needed for sinusitis, Disp: 14 tablet, Rfl: 0  EXAM: BP Readings from Last 3 Encounters:  09/26/19 128/74  06/20/19 128/84  02/01/18 130/84    VITALS per patient if applicable:  GENERAL: alert, oriented, appears well and in no acute distress mild upper respiratory congestion no respiratory distress stuffy nose obvious.  HEENT: atraumatic, conjunttiva clear, no obvious abnormalities on inspection of external nose and ears  NECK: normal movements of the head and neck  LUNGS: on inspection no signs of respiratory distress, breathing rate appears normal, no obvious gross SOB, gasping or wheezing  CV: no obvious  cyanosis  PSYCH/NEURO: pleasant and cooperative, no obvious depression or anxiety, speech and thought processing grossly intact   ASSESSMENT AND PLAN:  Discussed the following assessment and plan:    ICD-10-CM   1. URI, acute  J06.9   2. Rhinosinusitis  J31.0    J32.9    This is probably a viral URI and may resolve on its own based on context however if Covid test is positive she would be a candidate for monoclonal infusion 6 to 7 days out onset of symptoms.  She is high risk. If her test is positive she should contact us for referral for monoclonal antibody infusion. Otherwise continued sinus hygiene but if symptoms are persistent  progressive over the holiday weekend coming up she can add antibiotics sent into her local pharmacy if needed At this time would not add antibiotic based on her clinical syndrome.  This may resolve on its own. Counseled.   Expectant management and discussion of plan and treatment with opportunity to ask questions and all were answered. The patient agreed with the plan and demonstrated an understanding of the instructions.   Advised to call back or seek an in-person evaluation if worsening  or having  further concerns . Return if symptoms worsen or fail to improve as expected or if pos covid testing.   Shanon Ace, MD

## 2020-01-18 ENCOUNTER — Encounter: Payer: Self-pay | Admitting: Internal Medicine

## 2020-01-18 ENCOUNTER — Telehealth (INDEPENDENT_AMBULATORY_CARE_PROVIDER_SITE_OTHER): Payer: Medicare Other | Admitting: Internal Medicine

## 2020-01-18 DIAGNOSIS — J329 Chronic sinusitis, unspecified: Secondary | ICD-10-CM

## 2020-01-18 DIAGNOSIS — J310 Chronic rhinitis: Secondary | ICD-10-CM | POA: Diagnosis not present

## 2020-01-18 DIAGNOSIS — J069 Acute upper respiratory infection, unspecified: Secondary | ICD-10-CM

## 2020-01-18 MED ORDER — DOXYCYCLINE HYCLATE 100 MG PO TABS: 100.0000 mg | ORAL_TABLET | Freq: Two times a day (BID) | ORAL | 0 refills | Status: DC

## 2020-01-23 ENCOUNTER — Other Ambulatory Visit: Payer: Self-pay | Admitting: Internal Medicine

## 2020-01-27 HISTORY — PX: TOTAL KNEE REVISION: SHX996

## 2020-01-29 NOTE — Progress Notes (Unsigned)
No chief complaint on file.   HPI: [name] 70 y.o. come in for Chronic disease management  ROS: See pertinent positives and negatives per HPI.  Past Medical History:  Diagnosis Date  . Acute pyelonephritis 05/02/2012  . Asthma   . Diverticulitis 03/24/2017   [facility]  . Dyspnea    nl PFTs, and echo  . GERD (gastroesophageal reflux disease)   . Headache(784.0)   . Heart murmur   . History of kidney stones   . IBS (irritable bowel syndrome)   . Nephrolithiasis   . Pre-diabetes 12/15  . Recurrent oral ulcers   . Thyroid goiter    I 131 rx and then  total throidectomy 2005 [facility]    Family History  Problem Relation Age of Onset  . COPD Sister   . Coronary artery disease Sister        age 42 also copd  . Cancer Mother        bladder  . Arthritis Other   . Cancer Other        breast  . Diabetes Other   . Hyperlipidemia Other   . Hypertension Other   . Stroke Other   . Heart disease Other   . Coronary artery disease Father        also had AAA  . Aneurysm Father        Aortic and thoracic fatal    Social History   Socioeconomic History  . Marital status: Married    Spouse name: Not on file  . Number of children: Not on file  . Years of education: Not on file  . Highest education level: Not on file  Occupational History  . Not on file  Tobacco Use  . Smoking status: Never Smoker  . Smokeless tobacco: Never Used  Vaping Use  . Vaping Use: Never used  Substance and Sexual Activity  . Alcohol use: Yes    Comment: seldom  . Drug use: No  . Sexual activity: Not on file  Other Topics Concern  . 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 acetaminophen (TYLENOL) 500 MG tablet Take 1,000 mg by mouth 3 (three) times daily.    . bisacodyl (DULCOLAX) 5 MG EC tablet Take 1 tablet (5 mg total) by mouth daily as needed for moderate constipation. (Patient not taking: Reported on 01/18/2020) 30 tablet 0  . docusate sodium (COLACE) 100 MG capsule Take 1 capsule (100 mg total) by mouth daily as needed for mild constipation. (Patient not taking: Reported on 01/18/2020) 10 capsule 0  . doxycycline (VIBRA-TABS) 100 MG tablet Take 1 tablet (100 mg total) by mouth 2 (two) times daily. If needed for sinusitis 14 tablet 0  . metFORMIN (GLUCOPHAGE-XR) 500 MG 24 hr tablet Take 1 tablet (500 mg total) by mouth daily with breakfast. 90 tablet 1  . methocarbamol (ROBAXIN) 500 MG tablet Take 1-2 tablets (500-1,000 mg total) by mouth every 8 (  eight) hours as needed for muscle spasms. 60 tablet 0  . olmesartan (BENICAR) 20 MG tablet TAKE ONE TABLET BY MOUTH DAILY 90 tablet 1  . pantoprazole (PROTONIX) 40 MG tablet TAKE ONE TABLET BY MOUTH TWICE A DAY 180 tablet 0  . rOPINIRole (REQUIP) 0.5 MG tablet TAKE 1 TO 2 TABLETS BY MOUTH AT NOON, 5PM, AND THEN TAKE 3 TABLETS BY MOUTH EVERY NIGHT AT BEDTIME 540 tablet 0  . rosuvastatin (CRESTOR) 10 MG tablet TAKE ONE TABLET BY MOUTH DAILY 90 tablet 0  . SYNTHROID 112 MCG tablet TAKE ONE TABLET BY MOUTH EVERY MORNING BEFORE BREAKFAST 90 tablet 0   No facility-administered medications prior to visit.     EXAM:  There were no vitals taken for this visit.  There is no height or weight on file to calculate BMI.  GENERAL: vitals reviewed and listed above, alert, oriented, appears well hydrated and in no acute distress HEENT: atraumatic, conjunctiva  clear, no obvious abnormalities on inspection of external nose and ears OP : no lesion edema or exudate  NECK: no obvious masses on  inspection palpation  LUNGS: clear to auscultation bilaterally, no wheezes, rales or rhonchi, good air movement CV: HRRR, no clubbing cyanosis or  peripheral edema nl cap refill  MS: moves all extremities without noticeable focal  abnormality PSYCH: pleasant and cooperative, no obvious depression or anxiety Lab Results  Component Value Date   WBC 4.8 06/20/2019   HGB 13.7 06/20/2019   HCT 41.0 06/20/2019   PLT 227.0 06/20/2019   GLUCOSE 124 (H) 06/20/2019   CHOL 138 06/20/2019   TRIG 109.0 06/20/2019   HDL 51.40 06/20/2019   LDLDIRECT 142.0 01/28/2016   LDLCALC 65 06/20/2019   ALT 28 06/20/2019   AST 20 06/20/2019   NA 140 06/20/2019   K 4.5 06/20/2019   CL 102 06/20/2019   CREATININE 0.71 06/20/2019   BUN 17 06/20/2019   CO2 30 06/20/2019   TSH 1.96 06/20/2019   INR 1.12 03/17/2017   HGBA1C 6.5 (A) 09/26/2019   BP Readings from Last 3 Encounters:  09/26/19 128/74  06/20/19 128/84  02/01/18 130/84    ASSESSMENT AND PLAN:  Discussed the following assessment and plan:  Elevated hemoglobin A1c  Medication management  Hypothyroidism, unspecified type  Hyperlipidemia, unspecified hyperlipidemia type  -Patient advised to return or notify health care team  if  new concerns arise.  There are no Patient Instructions on file for this visit.   Neta Mends. Glory Graefe M.D.

## 2020-01-30 ENCOUNTER — Ambulatory Visit (INDEPENDENT_AMBULATORY_CARE_PROVIDER_SITE_OTHER): Payer: Medicare Other | Admitting: Internal Medicine

## 2020-01-30 ENCOUNTER — Other Ambulatory Visit: Payer: Self-pay

## 2020-01-30 ENCOUNTER — Encounter: Payer: Self-pay | Admitting: Internal Medicine

## 2020-01-30 VITALS — BP 118/78 | HR 72 | Temp 98.1°F | Wt 176.8 lb

## 2020-01-30 DIAGNOSIS — E1165 Type 2 diabetes mellitus with hyperglycemia: Secondary | ICD-10-CM

## 2020-01-30 DIAGNOSIS — R7309 Other abnormal glucose: Secondary | ICD-10-CM | POA: Diagnosis not present

## 2020-01-30 DIAGNOSIS — Z79899 Other long term (current) drug therapy: Secondary | ICD-10-CM

## 2020-01-30 DIAGNOSIS — J329 Chronic sinusitis, unspecified: Secondary | ICD-10-CM | POA: Diagnosis not present

## 2020-01-30 DIAGNOSIS — E039 Hypothyroidism, unspecified: Secondary | ICD-10-CM

## 2020-01-30 DIAGNOSIS — E785 Hyperlipidemia, unspecified: Secondary | ICD-10-CM

## 2020-01-30 LAB — POCT GLYCOSYLATED HEMOGLOBIN (HGB A1C): Hemoglobin A1C: 6.9 % — AB (ref 4.0–5.6)

## 2020-01-30 NOTE — Progress Notes (Signed)
Chief Complaint  Patient presents with  . Follow-up    HPI: [place] y.o. come in for Chronic disease management   EHR CHL production  down at time of visit  So reconstructed  Later in day .   resp : a lot better   90 % after antibiotic  Still has some sinus congestion but able to blow out mucous  Was tested for covid  Dec x mas time    No more face pain cough better   BG not taking metformin  Trying to not take it   .   bp ok  No change   Controlled  Daughter preg has recovering covid vaccinated byut she has not been there   caretaking  ROS: See pertinent positives and negatives per HPI.  Past Medical History:  Diagnosis Date  . Acute pyelonephritis 05/02/2012  . Asthma   . Diverticulitis 03/24/2017   see report [place] [facility]  . Dyspnea    nl PFTs, and echo  . GERD (gastroesophageal reflux disease)   . Headache(784.0)   . Heart murmur   . History of kidney stones   . IBS (irritable bowel syndrome)   . Nephrolithiasis   . Pre-diabetes 12/15  . Recurrent oral ulcers   . Thyroid goiter    I 131 rx and then  total throidectomy 2005 [facility]    Family History  Problem Relation Age of Onset  . COPD Sister   . Coronary artery disease Sister        age 48 also copd  . Cancer Mother        bladder  . Arthritis Other   . Cancer Other        breast  . Diabetes Other   . Hyperlipidemia Other   . Hypertension Other   . Stroke Other   . Heart disease Other   . Coronary artery disease Father        also had AAA  . Aneurysm Father        Aortic and thoracic fatal    Social History   Socioeconomic History  . Marital status: Married    Spouse name: Not on file  . Number of children: Not on file  . Years of education: Not on file  . Highest education level: Not on file  Occupational History  . Not on file  Tobacco Use  . Smoking status: Never Smoker  . Smokeless tobacco: Never Used  Vaping Use  . Vaping Use: Never used  Substance and  Sexual Activity  . Alcohol use: Yes    Comment: seldom  . Drug use: No  . Sexual activity: Not on file  Other Topics Concern  . Not on file  Social History Narrative   Married   Has grandchildren   Never smoked    G4P4   hh of 2  care of GC ages 98 - 54 month  5 days a week   Social Determinants of Radio broadcast assistant Strain: Not on file  Food Insecurity: Not on file  Transportation Needs: Not on file  Physical Activity: Not on file  Stress: Not on file  Social Connections: Not on file    Outpatient Medications Prior to Visit  Medication Sig Dispense Refill  . acetaminophen (TYLENOL) 500 MG tablet Take 1,000 mg by mouth 3 (three) times daily.    Marland Kitchen doxycycline (VIBRA-TABS) 100 MG tablet Take 1 tablet (100 mg total) by mouth 2 (two) times daily.  If needed for sinusitis 14 tablet 0  . metFORMIN (GLUCOPHAGE-XR) 500 MG 24 hr tablet Take 1 tablet (500 mg total) by mouth daily with breakfast. 90 tablet 1  . methocarbamol (ROBAXIN) 500 MG tablet Take 1-2 tablets (500-1,000 mg total) by mouth every 8 (eight) hours as needed for muscle spasms. 60 tablet 0  . olmesartan (BENICAR) 20 MG tablet TAKE ONE TABLET BY MOUTH DAILY 90 tablet 1  . pantoprazole (PROTONIX) 40 MG tablet TAKE ONE TABLET BY MOUTH TWICE A DAY 180 tablet 0  . rOPINIRole (REQUIP) 0.5 MG tablet TAKE 1 TO 2 TABLETS BY MOUTH AT NOON, 5PM, AND THEN TAKE 3 TABLETS BY MOUTH EVERY NIGHT AT BEDTIME 540 tablet 0  . rosuvastatin (CRESTOR) 10 MG tablet TAKE ONE TABLET BY MOUTH DAILY 90 tablet 0  . SYNTHROID 112 MCG tablet TAKE ONE TABLET BY MOUTH EVERY MORNING BEFORE BREAKFAST 90 tablet 0  . bisacodyl (DULCOLAX) 5 MG EC tablet Take 1 tablet (5 mg total) by mouth daily as needed for moderate constipation. (Patient not taking: Reported on 01/30/2020) 30 tablet 0  . docusate sodium (COLACE) 100 MG capsule Take 1 capsule (100 mg total) by mouth daily as needed for mild constipation. (Patient not taking: Reported on 01/30/2020) 10 capsule  0   No facility-administered medications prior to visit.     EXAM:  BP 118/78 (BP Location: Left Arm, Patient Position: Sitting, Cuff Size: Normal)   Pulse 72   Temp 98.1 F (36.7 C) (Oral)   Wt 176 lb 12.8 oz (80.2 kg)   SpO2 95%   BMI 34.53 kg/m   Body mass index is 34.53 kg/m.  GENERAL: vitals reviewed and listed above, alert, oriented, appears well hydrated and in no acute distress HEENT: atraumatic, conjunctiva  clear, no obvious abnormalities on inspection of external nose and ears OP :masked   Seems congeted  No facde pain  NECK: no obvious masses on inspection palpation  LUNGS: clear to auscultation bilaterally, no wheezes, rales or rhonchi, ? cracak left base better on deep inspiration  No cough CV: HRRR, no clubbing cyanosis or  nl cap refill  MS: moves all extremities without noticeable focal  abnormality PSYCH: pleasant and cooperative, no obvious depression or anxiety Lab Results  Component Value Date   WBC 4.8 06/20/2019   HGB 13.7 06/20/2019   HCT 41.0 06/20/2019   PLT 227.0 06/20/2019   GLUCOSE 124 (H) 06/20/2019   CHOL 138 06/20/2019   TRIG 109.0 06/20/2019   HDL 51.40 06/20/2019   LDLDIRECT 142.0 01/28/2016   LDLCALC 65 06/20/2019   ALT 28 06/20/2019   AST 20 06/20/2019   NA 140 06/20/2019   K 4.5 06/20/2019   CL 102 06/20/2019   CREATININE 0.71 06/20/2019   BUN 17 06/20/2019   CO2 30 06/20/2019   TSH 1.96 06/20/2019   INR 1.12 03/17/2017   HGBA1C 6.9 (A) 01/30/2020   BP Readings from Last 3 Encounters:  01/30/20 118/78  09/26/19 128/74  06/20/19 128/84    ASSESSMENT AND PLAN:  Discussed the following assessment and plan:  Type 2 diabetes mellitus with hyperglycemia, without long-term current use of insulin ([place]) - Plan: Ambulatory referral to diabetic education, POCT glycosylated hemoglobin (Hb A1C)  Sinusitis, unspecified chronicity, unspecified location - improving see last visit tested  neg covid   Elevated hemoglobin A1c - now in  diabeteic range  6.9 disc take metformin lplanwe  order glucometer and strips after insurance coverage contact  refer fir education  Medication  management - Plan: Ambulatory referral to diabetic education  Hypothyroidism, unspecified type  Hyperlipidemia, unspecified hyperlipidemia type Start the met formin disc about  reasoning  lsi  To ensure adherence  Refer for diabetes education etc . Counsel. resp infection resolving  Fu if relapse and alarm sx   cpx and labs April May  Call in interim if needed  Get [place] info about covered strips and glucometer or patch if covered .  Somewhat prolonged respiratory infection primary viral but possible secondary sinusitis see above in previous visit.  Has tested negative for Covid. Counseled.  Overview diet  Goal of  Dm control  -Patient advised to return or notify health care team  if  new concerns arise. In interim   There are no Patient Instructions on file for this visit.

## 2020-02-06 ENCOUNTER — Telehealth: Payer: Self-pay | Admitting: Internal Medicine

## 2020-02-06 NOTE — Telephone Encounter (Signed)
Pt is calling in stating that she contacted her insurance as instructed and they told her that they will cover.  Pt would like the following diabetic supplies Dexcom and Freestyle Kit and supplies.  Pharm:  [facility] on [address] in [place] 

## 2020-02-06 NOTE — Telephone Encounter (Signed)
So usually Medicare will pay for Dexcom only if you are on insulin and needed adjustments.   However please send in the freestyle machine with strips to be able to test twice a day for the next month dispense 1 box with as needed refills.  And any additional supplies needed.  If she is talking about the freestyle libre 14-day system that is different.  That is continuous glucose monitoring not sure what the requirements are for Medicare although if wishes can send it in under the diagnosis of diabetes mellitus type 2.

## 2020-02-07 ENCOUNTER — Other Ambulatory Visit: Payer: Self-pay

## 2020-02-07 MED ORDER — FREESTYLE SYSTEM KIT: 1.0000 | PACK | 3 refills | Status: DC | PRN

## 2020-02-07 NOTE — Telephone Encounter (Signed)
Pt is calling in to check the status of the below msg and would like to have a call back today.

## 2020-02-07 NOTE — Telephone Encounter (Signed)
Spoke with patient.  Sent to [facility] Freestyle glucose monitoring kit.

## 2020-02-08 MED ORDER — FREESTYLE LITE TEST VI STRP: 1.0000 | ORAL_STRIP | Freq: Two times a day (BID) | 12 refills | Status: DC

## 2020-02-08 MED ORDER — FREESTYLE LITE DEVI: 0 refills | Status: DC

## 2020-02-08 NOTE — Addendum Note (Signed)
Addended by: Westley Hummer B on: 02/08/2020 01:23 PM   Modules accepted: Orders

## 2020-02-09 ENCOUNTER — Telehealth: Payer: Self-pay | Admitting: Internal Medicine

## 2020-02-09 NOTE — Telephone Encounter (Signed)
Pt need help on  using the reestyle libre 14-day system want to know if she can come in . Having problems with her BS levels

## 2020-02-09 NOTE — Telephone Encounter (Signed)
Pts friend Karna Christmas) is calling in to see if they can get someone to call the pt so that she can come in to the office today toe be shown how to use her new glucose meter since she is a new diabetic and do not understand what to expect and how to use it.

## 2020-02-09 NOTE — Telephone Encounter (Signed)
[name] is calling and stated that they were trying to fill  patients medication but the diagnostic code is wrong and is asking for a call back, please advise. CB is [phone]

## 2020-02-12 ENCOUNTER — Ambulatory Visit: Payer: Medicare Other | Admitting: Registered"

## 2020-02-12 ENCOUNTER — Encounter: Payer: Medicare Other | Attending: Internal Medicine | Admitting: Registered"

## 2020-02-12 ENCOUNTER — Encounter: Payer: Self-pay | Admitting: Registered"

## 2020-02-12 DIAGNOSIS — Z79899 Other long term (current) drug therapy: Secondary | ICD-10-CM | POA: Insufficient documentation

## 2020-02-12 DIAGNOSIS — E1165 Type 2 diabetes mellitus with hyperglycemia: Secondary | ICD-10-CM | POA: Insufficient documentation

## 2020-02-12 DIAGNOSIS — E119 Type 2 diabetes mellitus without complications: Secondary | ICD-10-CM

## 2020-02-12 NOTE — Progress Notes (Signed)
Diabetes Self-Management Education  Visit Type: First/Initial  Appt. Start Time: 0755 Appt. End Time: 3220  02/12/2020  [name], identified by name and date of birth, is a 70 y.o. female with a diagnosis of Diabetes: Type 2.   ASSESSMENT  There were no vitals taken for this visit. There is no height or weight on file to calculate BMI.  Patient states she had been doing Weight Watchers and was surprise that after lost weight eating fruit that her A1c increased. Pt reports since diagnosis has cut back sugar and fruit. 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  Medication: Metformin 500 mg with breakfast. Pt reports "really bad diarrhea" today. RD suggested taking in evening with dinner to reduce side effect.  Patient states started checking CBGs 3 days ago. FBS 139, 120, 120; afternoons and evenings 93-142 mg/dL  Relevant problems: Pt reports upcoming appt for pancreatis Pt reports in 2018 she was admitted16 days for diverticulitis, and since discharge has been avoiding seeds and popcorn.  Patient states she would prefer future visits to be in-person.   Diabetes Self-Management Education - 02/12/20 0801      Visit Information   Visit Type First/Initial      Initial Visit   Diabetes Type Type 2    Are you currently following a meal plan? Yes    What type of meal plan do you follow? staying away from sugar    Are you taking your medications as prescribed? Yes    Date Diagnosed 01/30/2020      Health Coping   How would you rate your overall health? Good      Psychosocial Assessment   Patient Belief/Attitude about Diabetes Other (comment)   feels stupid about it   How often do you need to have someone help you when you read instructions, pamphlets, or other written materials from your doctor or pharmacy? 2 - Rarely    What is the last grade level you completed in school? 12       Complications   Last HgB A1C per patient/outside source 6.9 %    Fasting Blood glucose range (mg/dL) 130-179;70-129    Have you had a dilated eye exam in the past 12 months? No    Have you had a dental exam in the past 12 months? Yes    Are you checking your feet? Yes    How many days per week are you checking your feet? 7      Dietary Intake   Breakfast skip OR 2 egg, small sausage, biscuit OR oatmeal  OR biscuit, coffee, 1/2 tsp sugar, milk    Snack (morning) apple    Lunch 1/2 burger, peaches, cranberry sauce    Dinner taco salad, decaf unsweet tea    Snack (evening) fruit    Beverage(s) water, unsweet decaf tea, coffee, 28 oz no-sugar powerade      Exercise   Exercise Type Light (walking / raking leaves)      Patient Education   Previous Diabetes Education No    Nutrition management  Role of diet in the treatment of diabetes and the relationship between the three main macronutrients and blood glucose level;Food label reading, portion sizes and measuring food.;Carbohydrate counting    Physical activity and exercise  Role of exercise on diabetes management, blood pressure control and cardiac health.    Medications Reviewed patients medication for  diabetes, action, purpose, timing of dose and side effects.    Monitoring Taught/evaluated SMBG meter.    Psychosocial adjustment Role of stress on diabetes      Individualized Goals (developed by patient)   Nutrition General guidelines for healthy choices and portions discussed    Physical Activity Exercise 3-5 times per week    Medications take my medication as prescribed    Monitoring  test my blood glucose as discussed      Outcomes   Expected Outcomes Demonstrated interest in learning. Expect positive outcomes    Future DMSE 4-6 wks    Program Status Not Completed           Individualized Plan for Diabetes Self-Management Training:   Learning Objective:  Patient will have a greater understanding of diabetes  self-management. Patient education plan is to attend individual and/or group sessions per assessed needs and concerns.   Plan:   Patient Instructions  Plan:  Aim for 2 Carb Choices per meal (30 grams) +/- 1 either way  Aim for 0-1 Carbs per snack if hungry  Include protein with your meals and snacks Consider reading food labels for Total Carbohydrate of foods Consider increasing your activity level, recommended 30 minutes 3-5 times/week as tolerated Consider checking blood sugar, Fasting and bedtime for your docotr, and 2 hours after some meals for feedback on how well you body handled the carbohydrates in that mea.  Continue taking medication as directed by MD  Some helpful websites:  Planning Healthy Meals handout that will be mailed to you: [email or web address]  Free Program for people with new diagnosis of diabetes: https://www.diabetes.org/diabetes/type-2/living-with-type-2-diabetes-program     Expected Outcomes:  Demonstrated interest in learning. Expect positive outcomes  Education material provided: Planning health meals (website and shared screen)  If problems or questions, patient to contact team via:  Phone and MyChart  Future DSME appointment: 4-6 wks

## 2020-02-12 NOTE — Patient Instructions (Addendum)
Plan:  Aim for 2 Carb Choices per meal (30 grams) +/- 1 either way  Aim for 0-1 Carbs per snack if hungry  Include protein with your meals and snacks Consider reading food labels for Total Carbohydrate of foods Consider increasing your activity level, recommended 30 minutes 3-5 times/week as tolerated Consider checking blood sugar, Fasting and bedtime for your docotr, and 2 hours after some meals for feedback on how well you body handled the carbohydrates in that mea.  Continue taking medication as directed by MD  Some helpful websites:  Planning Healthy Meals handout that will be mailed to you: [email or web address]  Free Program for people with new diagnosis of diabetes: https://www.diabetes.org/diabetes/type-2/living-with-type-2-diabetes-program

## 2020-02-13 DIAGNOSIS — E119 Type 2 diabetes mellitus without complications: Secondary | ICD-10-CM | POA: Insufficient documentation

## 2020-02-13 NOTE — Telephone Encounter (Addendum)
Dr. Regis Bill should this patient be scheduled for a nurse visit? Please advise this.

## 2020-02-13 NOTE — Telephone Encounter (Signed)
She is also to be seeing   Diabetes nutrition education  Reach out and see if they can help also

## 2020-02-13 NOTE — Telephone Encounter (Signed)
Since I dont have a nursing staff assigned to me   ask  Brittney what staff can help this patient   Begin monitoring. And  Make  Nurse appt  With them.

## 2020-02-13 NOTE — Telephone Encounter (Signed)
Called pharmacy left voicemail

## 2020-02-14 NOTE — Telephone Encounter (Signed)
Patient informed of the message below and stated she saw someone and has had this taken care of.

## 2020-02-21 ENCOUNTER — Ambulatory Visit: Payer: Medicare Other | Admitting: Registered"

## 2020-03-12 ENCOUNTER — Other Ambulatory Visit: Payer: Self-pay | Admitting: Internal Medicine

## 2020-03-25 DIAGNOSIS — R739 Hyperglycemia, unspecified: Secondary | ICD-10-CM | POA: Diagnosis not present

## 2020-03-25 DIAGNOSIS — Z9049 Acquired absence of other specified parts of digestive tract: Secondary | ICD-10-CM | POA: Diagnosis not present

## 2020-03-27 LAB — HM MAMMOGRAPHY

## 2020-04-09 DIAGNOSIS — R921 Mammographic calcification found on diagnostic imaging of breast: Secondary | ICD-10-CM | POA: Diagnosis not present

## 2020-04-09 DIAGNOSIS — R922 Inconclusive mammogram: Secondary | ICD-10-CM | POA: Diagnosis not present

## 2020-04-09 DIAGNOSIS — R928 Other abnormal and inconclusive findings on diagnostic imaging of breast: Secondary | ICD-10-CM | POA: Diagnosis not present

## 2020-04-09 LAB — HM MAMMOGRAPHY

## 2020-04-11 ENCOUNTER — Encounter: Payer: Self-pay | Admitting: Internal Medicine

## 2020-04-15 ENCOUNTER — Other Ambulatory Visit: Payer: Self-pay | Admitting: Internal Medicine

## 2020-04-29 ENCOUNTER — Encounter: Payer: Medicare Other | Admitting: Internal Medicine

## 2020-05-13 ENCOUNTER — Other Ambulatory Visit: Payer: Self-pay | Admitting: Internal Medicine

## 2020-06-25 ENCOUNTER — Ambulatory Visit (INDEPENDENT_AMBULATORY_CARE_PROVIDER_SITE_OTHER): Payer: Medicare Other

## 2020-06-25 DIAGNOSIS — Z0000 Encounter for general adult medical examination without abnormal findings: Secondary | ICD-10-CM

## 2020-06-25 DIAGNOSIS — M9905 Segmental and somatic dysfunction of pelvic region: Secondary | ICD-10-CM | POA: Diagnosis not present

## 2020-06-25 DIAGNOSIS — M9903 Segmental and somatic dysfunction of lumbar region: Secondary | ICD-10-CM | POA: Diagnosis not present

## 2020-06-25 DIAGNOSIS — Z0100 Encounter for examination of eyes and vision without abnormal findings: Secondary | ICD-10-CM

## 2020-06-25 DIAGNOSIS — M5136 Other intervertebral disc degeneration, lumbar region: Secondary | ICD-10-CM | POA: Diagnosis not present

## 2020-06-25 DIAGNOSIS — M9904 Segmental and somatic dysfunction of sacral region: Secondary | ICD-10-CM | POA: Diagnosis not present

## 2020-06-25 NOTE — Patient Instructions (Signed)
Ms. [name] , Thank you for taking time to come for your Medicare Wellness Visit. I appreciate your ongoing commitment to your health goals. Please review the following plan we discussed and let me know if I can assist you in the future.   Screening recommendations/referrals: Colonoscopy: current due 09/08/2027 Mammogram: current due 04/09/2021 Bone Density: current  Due 03/21/2021 Recommended yearly ophthalmology/optometry visit for glaucoma screening and checkup Recommended yearly dental visit for hygiene and checkup  Vaccinations: Influenza vaccine: due in fall 2022 Pneumococcal vaccine: completed series  Tdap vaccine: current due 2024 Shingles vaccine: will obtain local pharmacy   Advanced directives: will provide copies   Conditions/risks identified: none   Next appointment: none    Preventive Care 35 Years and Older, Female Preventive care refers to lifestyle choices and visits with your health care provider that can promote health and wellness. What does preventive care include?  A yearly physical exam. This is also called an annual well check.  Dental exams once or twice a year.  Routine eye exams. Ask your health care provider how often you should have your eyes checked.  Personal lifestyle choices, including:  Daily care of your teeth and gums.  Regular physical activity.  Eating a healthy diet.  Avoiding tobacco and drug use.  Limiting alcohol use.  Practicing safe sex.  Taking low-dose aspirin every day.  Taking vitamin and mineral supplements as recommended by your health care provider. What happens during an annual well check? The services and screenings done by your health care provider during your annual well check will depend on your age, overall health, lifestyle risk factors, and family history of disease. Counseling  Your health care provider may ask you questions about your:  Alcohol use.  Tobacco use.  Drug use.  Emotional  well-being.  Home and relationship well-being.  Sexual activity.  Eating habits.  History of falls.  Memory and ability to understand (cognition).  Work and work Statistician.  Reproductive health. Screening  You may have the following tests or measurements:  Height, weight, and BMI.  Blood pressure.  Lipid and cholesterol levels. These may be checked every 5 years, or more frequently if you are over 33 years old.  Skin check.  Lung cancer screening. You may have this screening every year starting at age 18 if you have a 30-pack-year history of smoking and currently smoke or have quit within the past 15 years.  Fecal occult blood test (FOBT) of the stool. You may have this test every year starting at age 41.  Flexible sigmoidoscopy or colonoscopy. You may have a sigmoidoscopy every 5 years or a colonoscopy every 10 years starting at age 60.  Hepatitis C blood test.  Hepatitis B blood test.  Sexually transmitted disease (STD) testing.  Diabetes screening. This is done by checking your blood sugar (glucose) after you have not eaten for a while (fasting). You may have this done every 1-3 years.  Bone density scan. This is done to screen for osteoporosis. You may have this done starting at age 97.  Mammogram. This may be done every 1-2 years. Talk to your health care provider about how often you should have regular mammograms. Talk with your health care provider about your test results, treatment options, and if necessary, the need for more tests. Vaccines  Your health care provider may recommend certain vaccines, such as:  Influenza vaccine. This is recommended every year.  Tetanus, diphtheria, and acellular pertussis (Tdap, Td) vaccine. You may need a Td  booster every 10 years.  Zoster vaccine. You may need this after age 53.  Pneumococcal 13-valent conjugate (PCV13) vaccine. One dose is recommended after age 27.  Pneumococcal polysaccharide (PPSV23) vaccine. One  dose is recommended after age 21. Talk to your health care provider about which screenings and vaccines you need and how often you need them. This information is not intended to replace advice given to you by your health care provider. Make sure you discuss any questions you have with your health care provider. Document Released: 02/08/2015 Document Revised: 10/02/2015 Document Reviewed: 11/13/2014 Elsevier Interactive Patient Education  2017 [place] Prevention in the Home Falls can cause injuries. They can happen to people of all ages. There are many things you can do to make your home safe and to help prevent falls. What can I do on the outside of my home?  Regularly fix the edges of walkways and driveways and fix any cracks.  Remove anything that might make you trip as you walk through a door, such as a raised step or threshold.  Trim any bushes or trees on the path to your home.  Use bright outdoor lighting.  Clear any walking paths of anything that might make someone trip, such as rocks or tools.  Regularly check to see if handrails are loose or broken. Make sure that both sides of any steps have handrails.  Any raised decks and porches should have guardrails on the edges.  Have any leaves, snow, or ice cleared regularly.  Use sand or salt on walking paths during winter.  Clean up any spills in your garage right away. This includes oil or grease spills. What can I do in the bathroom?  Use night lights.  Install grab bars by the toilet and in the tub and shower. Do not use towel bars as grab bars.  Use non-skid mats or decals in the tub or shower.  If you need to sit down in the shower, use a plastic, non-slip stool.  Keep the floor dry. Clean up any water that spills on the floor as soon as it happens.  Remove soap buildup in the tub or shower regularly.  Attach bath mats securely with double-sided non-slip rug tape.  Do not have throw rugs and other  things on the floor that can make you trip. What can I do in the bedroom?  Use night lights.  Make sure that you have a light by your bed that is easy to reach.  Do not use any sheets or blankets that are too big for your bed. They should not hang down onto the floor.  Have a firm chair that has side arms. You can use this for support while you get dressed.  Do not have throw rugs and other things on the floor that can make you trip. What can I do in the kitchen?  Clean up any spills right away.  Avoid walking on wet floors.  Keep items that you use a lot in easy-to-reach places.  If you need to reach something above you, use a strong step stool that has a grab bar.  Keep electrical cords out of the way.  Do not use floor polish or wax that makes floors slippery. If you must use wax, use non-skid floor wax.  Do not have throw rugs and other things on the floor that can make you trip. What can I do with my stairs?  Do not leave any items on the stairs.  Make sure that there are handrails on both sides of the stairs and use them. Fix handrails that are broken or loose. Make sure that handrails are as long as the stairways.  Check any carpeting to make sure that it is firmly attached to the stairs. Fix any carpet that is loose or worn.  Avoid having throw rugs at the top or bottom of the stairs. If you do have throw rugs, attach them to the floor with carpet tape.  Make sure that you have a light switch at the top of the stairs and the bottom of the stairs. If you do not have them, ask someone to add them for you. What else can I do to help prevent falls?  Wear shoes that:  Do not have high heels.  Have rubber bottoms.  Are comfortable and fit you well.  Are closed at the toe. Do not wear sandals.  If you use a stepladder:  Make sure that it is fully opened. Do not climb a closed stepladder.  Make sure that both sides of the stepladder are locked into place.  Ask  someone to hold it for you, if possible.  Clearly mark and make sure that you can see:  Any grab bars or handrails.  First and last steps.  Where the edge of each step is.  Use tools that help you move around (mobility aids) if they are needed. These include:  Canes.  Walkers.  Scooters.  Crutches.  Turn on the lights when you go into a dark area. Replace any light bulbs as soon as they burn out.  Set up your furniture so you have a clear path. Avoid moving your furniture around.  If any of your floors are uneven, fix them.  If there are any pets around you, be aware of where they are.  Review your medicines with your doctor. Some medicines can make you feel dizzy. This can increase your chance of falling. Ask your doctor what other things that you can do to help prevent falls. This information is not intended to replace advice given to you by your health care provider. Make sure you discuss any questions you have with your health care provider. Document Released: 11/08/2008 Document Revised: 06/20/2015 Document Reviewed: 02/16/2014 Elsevier Interactive Patient Education  2017 [employer].

## 2020-06-25 NOTE — Progress Notes (Signed)
Subjective:   [name] is a 70 y.o. female who presents for an Initial Medicare Annual Wellness Visit.  I connected with [place] today by telephone and verified that I am speaking with the correct person using two identifiers. Location patient: home Location provider: work Persons participating in the virtual visit: patient, provider.   I discussed the limitations, risks, security and privacy concerns of performing an evaluation and management service by telephone and the availability of in person appointments. I also discussed with the patient that there may be a patient responsible charge related to this service. The patient expressed understanding and verbally consented to this telephonic visit.    Interactive audio and video telecommunications were attempted between this provider and patient, however failed, due to patient having technical difficulties OR patient did not have access to video capability.  We continued and completed visit with audio only.    Review of Systems    n/a       Objective:    There were no vitals filed for this visit. There is no height or weight on file to calculate BMI.  Advanced Directives 02/12/2020 03/24/2017 03/16/2017 03/16/2017 10/18/2016 04/13/2016 03/02/2014  Does Patient Have a Medical Advance Directive? Yes - Yes Yes Yes Yes Yes  Type of Advance Directive - [place];Living will [place];Living will [place];Living will [place];Living will [place];Living will -  Does patient want to make changes to medical advance directive? No - Patient declined - No - Patient declined - - - -  Copy of [place] in Chart? - - Yes - No - copy requested - -  Would patient like information on creating a medical advance directive? - No - Patient declined No - Patient declined No - Patient declined - - -    Current Medications  (verified) Outpatient Encounter Medications as of 06/25/2020  Medication Sig  . acetaminophen (TYLENOL) 500 MG tablet Take 1,000 mg by mouth 3 (three) times daily.  . bisacodyl (DULCOLAX) 5 MG EC tablet Take 1 tablet (5 mg total) by mouth daily as needed for moderate constipation. (Patient not taking: Reported on 01/30/2020)  . Blood Glucose Monitoring Suppl (FREESTYLE LITE) DEVI Use twice daily for glucose control  . docusate sodium (COLACE) 100 MG capsule Take 1 capsule (100 mg total) by mouth daily as needed for mild constipation. (Patient not taking: Reported on 01/30/2020)  . doxycycline (VIBRA-TABS) 100 MG tablet Take 1 tablet (100 mg total) by mouth 2 (two) times daily. If needed for sinusitis  . glucose blood (FREESTYLE LITE) test strip 1 each by Other route 2 (two) times daily.  . metFORMIN (GLUCOPHAGE-XR) 500 MG 24 hr tablet Take 1 tablet (500 mg total) by mouth daily with breakfast.  . methocarbamol (ROBAXIN) 500 MG tablet Take 1-2 tablets (500-1,000 mg total) by mouth every 8 (eight) hours as needed for muscle spasms.  Marland Kitchen olmesartan (BENICAR) 20 MG tablet TAKE ONE TABLET BY MOUTH DAILY  . pantoprazole (PROTONIX) 40 MG tablet TAKE ONE TABLET BY MOUTH TWICE A DAY  . rOPINIRole (REQUIP) 0.5 MG tablet TAKE 1 TO 2 TABLETS BY MOUTH AT NOON AND AT 5PM, AND THEN TAKE 3 TABLETS EVERY NIGHT AT BEDTIME  . rosuvastatin (CRESTOR) 10 MG tablet TAKE ONE TABLET BY MOUTH DAILY  . SYNTHROID 112 MCG tablet TAKE ONE TABLET BY MOUTH EVERY MORNING BEFORE BREAKFAST   No facility-administered encounter medications on file as of  06/25/2020.    Allergies (verified) Hydralazine, Ciprofloxacin, Propofol, Ritalin [methylphenidate hcl], and Hydralazine hcl   History: Past Medical History:  Diagnosis Date  . Acute pyelonephritis 05/02/2012  . Asthma   . Diverticulitis 03/24/2017   see report [facility] [place] [facility]  . Dyspnea    nl PFTs, and echo  . GERD (gastroesophageal reflux disease)   . Headache(784.0)    . Heart murmur   . History of kidney stones   . IBS (irritable bowel syndrome)   . Nephrolithiasis   . Pre-diabetes 12/15  . Recurrent oral ulcers   . Thyroid goiter    I 131 rx and then  total throidectomy 2005 [facility]   Past Surgical History:  Procedure Laterality Date  . ABDOMINAL HYSTERECTOMY  1994   fibroid  . APPENDECTOMY  1968  . CYSTOSCOPY WITH STENT PLACEMENT Bilateral 03/24/2017   Procedure: BILATERAL URETERAL CATHETER  PLACEMENT;  Surgeon: Alexis Frock, MD;  Location: [facility];  Service: Urology;  Laterality: Bilateral;  . CYSTOSCOPY/RETROGRADE/URETEROSCOPY  03/24/2017   Procedure: CYSTOSCOPY/RETROGRADE/URETEROSCOPY;  Surgeon: Alexis Frock, MD;  Location: [facility];  Service: Urology;;  . LAPAROSCOPIC PARTIAL COLECTOMY Left 03/24/2017   Procedure: LAPAROSCOPIC ASSISTED LEFT  SIGMOID COLECTOMY;  Surgeon: Alphonsa Overall, MD;  Location: [facility];  Service: General;  Laterality: Left;  . orif left tivial plateau fracture     Dr. Collier [place] 2/08  . plates and pins take out of left knee  06/2008  . SIGMOIDOSCOPY  03/24/2017   Procedure: SIGMOIDOSCOPY;  Surgeon: Alphonsa Overall, MD;  Location: [facility];  Service: General;;  . TONSILLECTOMY  1979  . TOTAL THYROIDECTOMY  2005   for  large nodule 2006   Family History  Problem Relation Age of Onset  . COPD Sister   . Coronary artery disease Sister        age 52 also copd  . Cancer Mother        bladder  . Arthritis Other   . Cancer Other        breast  . Diabetes Other   . Hyperlipidemia Other   . Hypertension Other   . Stroke Other   . Heart disease Other   . Coronary artery disease Father        also had AAA  . Aneurysm Father        Aortic and thoracic fatal   Social History   Socioeconomic History  . Marital status: Married    Spouse name: Not on file  . Number of children: Not on file  . Years of education: Not on file  . Highest education level: Not on file  Occupational History  . Not on file  Tobacco Use  .  Smoking status: Never Smoker  . Smokeless tobacco: Never Used  Vaping Use  . Vaping Use: Never used  Substance and Sexual Activity  . Alcohol use: Yes    Comment: seldom  . Drug use: No  . Sexual activity: Not on file  Other Topics Concern  . Not on file  Social History Narrative   Married   Has grandchildren   Never smoked    G4P4   hh of 2  care of GC ages 47 - 62 month  5 days a week   Social Determinants of Radio broadcast assistant Strain: Not on file  Food Insecurity: Not on file  Transportation Needs: Not on file  Physical Activity: Not on file  Stress: Not on file  Social Connections: Not on file  Tobacco Counseling Counseling given: Not Answered   Clinical Intake:                 Diabetic?yes Nutrition Risk Assessment:  Has the patient had any N/V/D within the last 2 months?  No  Does the patient have any non-healing wounds?  No  Has the patient had any unintentional weight loss or weight gain?  No   Diabetes:  Is the patient diabetic?  Yes  If diabetic, was a CBG obtained today?  No  Did the patient bring in their glucometer from home?  No  How often do you monitor your CBG's? Twice a week .   Financial Strains and Diabetes Management:  Are you having any financial strains with the device, your supplies or your medication? No .  Does the patient want to be seen by Chronic Care Management for management of their diabetes?  No  Would the patient like to be referred to a Nutritionist or for Diabetic Management?  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 Pt is scheduled for diabetic foot exam on next office visit .         Activities of Daily Living No flowsheet data found.  Patient Care Team: Panosh, Standley Brooking, MD as PCP - General Josue Hector, MD as PCP - Cardiology (Cardiology) Sallyanne Havers,  MD as Referring Physician (Orthopedic Surgery) Lowella Bandy, MD (Inactive) as Attending Physician (Urology) Josue Hector, MD as Consulting Physician (Cardiology) Richmond Campbell, MD as Consulting Physician (Gastroenterology)  Indicate any recent Medical Services you may have received from other than [facility] providers in the past year (date may be approximate).     Assessment:   This is a routine wellness examination for [place].  Hearing/Vision screen No exam data present  Dietary issues and exercise activities discussed:    Goals Addressed   None    Depression Screen PHQ 2/9 Scores 02/12/2020 11/06/2019 06/20/2019 03/09/2017 01/28/2016 03/02/2014  PHQ - 2 Score 0 0 0 0 0 2  PHQ- 9 Score - - 0 - - 4    Fall Risk Fall Risk  02/12/2020 11/06/2019 06/20/2019 12/21/2018 08/29/2018  Falls in the past year? 0 0 0 0 (No Data)  Comment - - - [facility] Telephone Survey: data to providers prior to load [employer] Telephone Survey: data to providers prior to load  Number falls in past yr: - 0 - - (No Data)  Comment - - - - [facility] Telephone Survey Actual Response =   Injury with Fall? - 0 - - -  Follow up - Falls evaluation completed - - -    FALL RISK PREVENTION PERTAINING TO THE HOME:  Any stairs in or around the home? No  If so, are there any without handrails? No  Home free of loose throw rugs in walkways, pet beds, electrical cords, etc? Yes  Adequate lighting in your home to reduce risk of falls? Yes   ASSISTIVE DEVICES UTILIZED TO PREVENT FALLS:  Life alert? No  Use of a cane, walker or w/c? No  Grab bars in the bathroom? Yes  Shower chair or bench in shower? Yes  Elevated toilet seat or a handicapped toilet? Yes    Cognitive Function:     Normal cognitive status assessed by direct observation by this Nurse Health Advisor. No abnormalities found.      Immunizations  Immunization History  Administered Date(s) Administered  . Pneumococcal Conjugate-13 01/28/2016  . Pneumococcal  Polysaccharide-23 03/09/2017  . Td 11/26/2001  . Tdap 01/24/2013  . Zoster, Live 01/24/2013    TDAP status: Up to date  Flu Vaccine status: Due, Education has been provided regarding the importance of this vaccine. Advised may receive this vaccine at local pharmacy or Health Dept. Aware to provide a copy of the vaccination record if obtained from local pharmacy or Health Dept. Verbalized acceptance and understanding.  Pneumococcal vaccine status: Declined,  Education has been provided regarding the importance of this vaccine but patient still declined. Advised may receive this vaccine at local pharmacy or Health Dept. Aware to provide a copy of the vaccination record if obtained from local pharmacy or Health Dept. Verbalized acceptance and understanding.   Covid-19 vaccine status: Declined, Education has been provided regarding the importance of this vaccine but patient still declined. Advised may receive this vaccine at local pharmacy or Health Dept.or vaccine clinic. Aware to provide a copy of the vaccination record if obtained from local pharmacy or Health Dept. Verbalized acceptance and understanding.  Qualifies for Shingles Vaccine? Yes   Zostavax completed No   Shingrix Completed?: Yes  Screening Tests Health Maintenance  Topic Date Due  . COVID-19 Vaccine (1) Never done  . FOOT EXAM  Never done  . OPHTHALMOLOGY EXAM  Never done  . Zoster Vaccines- Shingrix (1 of 2) Never done  . HEMOGLOBIN A1C  07/29/2020  . INFLUENZA VACCINE  08/26/2020  . MAMMOGRAM  04/10/2022  . TETANUS/TDAP  01/25/2023  . COLONOSCOPY (Pts 45-41yrs Insurance coverage will need to be confirmed)  09/08/2027  . DEXA SCAN  Completed  . Hepatitis C Screening  Completed  . PNA vac Low Risk Adult  Completed  . HPV VACCINES  Aged Out    Health Maintenance  Health Maintenance Due  Topic Date Due  . COVID-19 Vaccine (1) Never done  . FOOT EXAM  Never done  . OPHTHALMOLOGY EXAM  Never done  . Zoster  Vaccines- Shingrix (1 of 2) Never done    Colorectal cancer screening: Type of screening: Colonoscopy. Completed 09/10/2017. Repeat every 10 years  Mammogram status: Completed 04/09/2020. Repeat every year  Bone Density status: Completed 03/22/2019. Results reflect: Bone density results: NORMAL. Repeat every 10 years.  Lung Cancer Screening: (Low Dose CT Chest recommended if Age 62-80 years, 30 pack-year currently smoking OR have quit w/in 15years.) does not qualify.   Lung Cancer Screening Referral: n/a  Additional Screening:  Hepatitis C Screening: does qualify; Completed 03/01/2015   Vision Screening: Recommended annual ophthalmology exams for early detection of glaucoma and other disorders of the eye. Is the patient up to date with their annual eye exam?  Yes  Who is the provider or what is the name of the office in which the patient attends annual eye exams? Referral completed 06/25/2020 If pt is not established with a provider, would they like to be referred to a provider to establish care? Yes .   Dental Screening: Recommended annual dental exams for proper oral hygiene  Community Resource Referral / Chronic Care Management: CRR required this visit?  No   CCM required this visit?  No      Plan:     I have personally reviewed and noted the following in the patient's chart:   . Medical and social history . Use of alcohol, tobacco or illicit drugs  . Current medications and supplements including opioid prescriptions. Patient is  not currently taking opioid prescriptions. . Functional ability and status . Nutritional status . Physical activity . Advanced directives . List of other physicians . Hospitalizations, surgeries, and ER visits in previous 12 months . Vitals . Screenings to include cognitive, depression, and falls . Referrals and appointments  In addition, I have reviewed and discussed with patient certain preventive protocols, quality metrics, and best  practice recommendations. A written personalized care plan for preventive services as well as general preventive health recommendations were provided to patient.     Randel Pigg, LPN   [id number]   Nurse Notes: none

## 2020-06-26 DIAGNOSIS — M9903 Segmental and somatic dysfunction of lumbar region: Secondary | ICD-10-CM | POA: Diagnosis not present

## 2020-06-26 DIAGNOSIS — M5136 Other intervertebral disc degeneration, lumbar region: Secondary | ICD-10-CM | POA: Diagnosis not present

## 2020-06-26 DIAGNOSIS — M9904 Segmental and somatic dysfunction of sacral region: Secondary | ICD-10-CM | POA: Diagnosis not present

## 2020-06-26 DIAGNOSIS — M9905 Segmental and somatic dysfunction of pelvic region: Secondary | ICD-10-CM | POA: Diagnosis not present

## 2020-07-01 DIAGNOSIS — M5136 Other intervertebral disc degeneration, lumbar region: Secondary | ICD-10-CM | POA: Diagnosis not present

## 2020-07-01 DIAGNOSIS — M9904 Segmental and somatic dysfunction of sacral region: Secondary | ICD-10-CM | POA: Diagnosis not present

## 2020-07-01 DIAGNOSIS — M9905 Segmental and somatic dysfunction of pelvic region: Secondary | ICD-10-CM | POA: Diagnosis not present

## 2020-07-01 DIAGNOSIS — M9903 Segmental and somatic dysfunction of lumbar region: Secondary | ICD-10-CM | POA: Diagnosis not present

## 2020-07-02 DIAGNOSIS — M5136 Other intervertebral disc degeneration, lumbar region: Secondary | ICD-10-CM | POA: Diagnosis not present

## 2020-07-02 DIAGNOSIS — M9905 Segmental and somatic dysfunction of pelvic region: Secondary | ICD-10-CM | POA: Diagnosis not present

## 2020-07-02 DIAGNOSIS — M9903 Segmental and somatic dysfunction of lumbar region: Secondary | ICD-10-CM | POA: Diagnosis not present

## 2020-07-02 DIAGNOSIS — M9904 Segmental and somatic dysfunction of sacral region: Secondary | ICD-10-CM | POA: Diagnosis not present

## 2020-07-04 DIAGNOSIS — M9903 Segmental and somatic dysfunction of lumbar region: Secondary | ICD-10-CM | POA: Diagnosis not present

## 2020-07-04 DIAGNOSIS — M5136 Other intervertebral disc degeneration, lumbar region: Secondary | ICD-10-CM | POA: Diagnosis not present

## 2020-07-04 DIAGNOSIS — M9904 Segmental and somatic dysfunction of sacral region: Secondary | ICD-10-CM | POA: Diagnosis not present

## 2020-07-04 DIAGNOSIS — M9905 Segmental and somatic dysfunction of pelvic region: Secondary | ICD-10-CM | POA: Diagnosis not present

## 2020-07-08 DIAGNOSIS — M9903 Segmental and somatic dysfunction of lumbar region: Secondary | ICD-10-CM | POA: Diagnosis not present

## 2020-07-08 DIAGNOSIS — M9904 Segmental and somatic dysfunction of sacral region: Secondary | ICD-10-CM | POA: Diagnosis not present

## 2020-07-08 DIAGNOSIS — M9905 Segmental and somatic dysfunction of pelvic region: Secondary | ICD-10-CM | POA: Diagnosis not present

## 2020-07-08 DIAGNOSIS — M5136 Other intervertebral disc degeneration, lumbar region: Secondary | ICD-10-CM | POA: Diagnosis not present

## 2020-07-09 DIAGNOSIS — M9903 Segmental and somatic dysfunction of lumbar region: Secondary | ICD-10-CM | POA: Diagnosis not present

## 2020-07-09 DIAGNOSIS — M5136 Other intervertebral disc degeneration, lumbar region: Secondary | ICD-10-CM | POA: Diagnosis not present

## 2020-07-09 DIAGNOSIS — M9905 Segmental and somatic dysfunction of pelvic region: Secondary | ICD-10-CM | POA: Diagnosis not present

## 2020-07-09 DIAGNOSIS — M9904 Segmental and somatic dysfunction of sacral region: Secondary | ICD-10-CM | POA: Diagnosis not present

## 2020-07-10 DIAGNOSIS — M9904 Segmental and somatic dysfunction of sacral region: Secondary | ICD-10-CM | POA: Diagnosis not present

## 2020-07-10 DIAGNOSIS — M9903 Segmental and somatic dysfunction of lumbar region: Secondary | ICD-10-CM | POA: Diagnosis not present

## 2020-07-10 DIAGNOSIS — M5136 Other intervertebral disc degeneration, lumbar region: Secondary | ICD-10-CM | POA: Diagnosis not present

## 2020-07-10 DIAGNOSIS — M9905 Segmental and somatic dysfunction of pelvic region: Secondary | ICD-10-CM | POA: Diagnosis not present

## 2020-07-11 DIAGNOSIS — M9905 Segmental and somatic dysfunction of pelvic region: Secondary | ICD-10-CM | POA: Diagnosis not present

## 2020-07-11 DIAGNOSIS — M9903 Segmental and somatic dysfunction of lumbar region: Secondary | ICD-10-CM | POA: Diagnosis not present

## 2020-07-11 DIAGNOSIS — M9904 Segmental and somatic dysfunction of sacral region: Secondary | ICD-10-CM | POA: Diagnosis not present

## 2020-07-11 DIAGNOSIS — M5136 Other intervertebral disc degeneration, lumbar region: Secondary | ICD-10-CM | POA: Diagnosis not present

## 2020-07-15 DIAGNOSIS — M5136 Other intervertebral disc degeneration, lumbar region: Secondary | ICD-10-CM | POA: Diagnosis not present

## 2020-07-15 DIAGNOSIS — M9904 Segmental and somatic dysfunction of sacral region: Secondary | ICD-10-CM | POA: Diagnosis not present

## 2020-07-15 DIAGNOSIS — M9905 Segmental and somatic dysfunction of pelvic region: Secondary | ICD-10-CM | POA: Diagnosis not present

## 2020-07-15 DIAGNOSIS — M9903 Segmental and somatic dysfunction of lumbar region: Secondary | ICD-10-CM | POA: Diagnosis not present

## 2020-07-16 DIAGNOSIS — M5136 Other intervertebral disc degeneration, lumbar region: Secondary | ICD-10-CM | POA: Diagnosis not present

## 2020-07-16 DIAGNOSIS — M9904 Segmental and somatic dysfunction of sacral region: Secondary | ICD-10-CM | POA: Diagnosis not present

## 2020-07-16 DIAGNOSIS — M9905 Segmental and somatic dysfunction of pelvic region: Secondary | ICD-10-CM | POA: Diagnosis not present

## 2020-07-16 DIAGNOSIS — M9903 Segmental and somatic dysfunction of lumbar region: Secondary | ICD-10-CM | POA: Diagnosis not present

## 2020-07-22 DIAGNOSIS — M5136 Other intervertebral disc degeneration, lumbar region: Secondary | ICD-10-CM | POA: Diagnosis not present

## 2020-07-22 DIAGNOSIS — M9904 Segmental and somatic dysfunction of sacral region: Secondary | ICD-10-CM | POA: Diagnosis not present

## 2020-07-22 DIAGNOSIS — M9903 Segmental and somatic dysfunction of lumbar region: Secondary | ICD-10-CM | POA: Diagnosis not present

## 2020-07-22 DIAGNOSIS — M9905 Segmental and somatic dysfunction of pelvic region: Secondary | ICD-10-CM | POA: Diagnosis not present

## 2020-07-23 DIAGNOSIS — M9903 Segmental and somatic dysfunction of lumbar region: Secondary | ICD-10-CM | POA: Diagnosis not present

## 2020-07-23 DIAGNOSIS — M9905 Segmental and somatic dysfunction of pelvic region: Secondary | ICD-10-CM | POA: Diagnosis not present

## 2020-07-23 DIAGNOSIS — M9904 Segmental and somatic dysfunction of sacral region: Secondary | ICD-10-CM | POA: Diagnosis not present

## 2020-07-23 DIAGNOSIS — M5136 Other intervertebral disc degeneration, lumbar region: Secondary | ICD-10-CM | POA: Diagnosis not present

## 2020-07-24 DIAGNOSIS — M9904 Segmental and somatic dysfunction of sacral region: Secondary | ICD-10-CM | POA: Diagnosis not present

## 2020-07-24 DIAGNOSIS — M9905 Segmental and somatic dysfunction of pelvic region: Secondary | ICD-10-CM | POA: Diagnosis not present

## 2020-07-24 DIAGNOSIS — M5136 Other intervertebral disc degeneration, lumbar region: Secondary | ICD-10-CM | POA: Diagnosis not present

## 2020-07-24 DIAGNOSIS — M9903 Segmental and somatic dysfunction of lumbar region: Secondary | ICD-10-CM | POA: Diagnosis not present

## 2020-07-25 DIAGNOSIS — M5136 Other intervertebral disc degeneration, lumbar region: Secondary | ICD-10-CM | POA: Diagnosis not present

## 2020-07-25 DIAGNOSIS — M9905 Segmental and somatic dysfunction of pelvic region: Secondary | ICD-10-CM | POA: Diagnosis not present

## 2020-07-25 DIAGNOSIS — M9904 Segmental and somatic dysfunction of sacral region: Secondary | ICD-10-CM | POA: Diagnosis not present

## 2020-07-25 DIAGNOSIS — M9903 Segmental and somatic dysfunction of lumbar region: Secondary | ICD-10-CM | POA: Diagnosis not present

## 2020-07-31 ENCOUNTER — Other Ambulatory Visit: Payer: Self-pay | Admitting: Internal Medicine

## 2020-07-31 DIAGNOSIS — M9905 Segmental and somatic dysfunction of pelvic region: Secondary | ICD-10-CM | POA: Diagnosis not present

## 2020-07-31 DIAGNOSIS — M5136 Other intervertebral disc degeneration, lumbar region: Secondary | ICD-10-CM | POA: Diagnosis not present

## 2020-07-31 DIAGNOSIS — M9904 Segmental and somatic dysfunction of sacral region: Secondary | ICD-10-CM | POA: Diagnosis not present

## 2020-07-31 DIAGNOSIS — M9903 Segmental and somatic dysfunction of lumbar region: Secondary | ICD-10-CM | POA: Diagnosis not present

## 2020-08-01 DIAGNOSIS — M9903 Segmental and somatic dysfunction of lumbar region: Secondary | ICD-10-CM | POA: Diagnosis not present

## 2020-08-01 DIAGNOSIS — M9905 Segmental and somatic dysfunction of pelvic region: Secondary | ICD-10-CM | POA: Diagnosis not present

## 2020-08-01 DIAGNOSIS — M9904 Segmental and somatic dysfunction of sacral region: Secondary | ICD-10-CM | POA: Diagnosis not present

## 2020-08-01 DIAGNOSIS — M5136 Other intervertebral disc degeneration, lumbar region: Secondary | ICD-10-CM | POA: Diagnosis not present

## 2020-08-26 DIAGNOSIS — M1711 Unilateral primary osteoarthritis, right knee: Secondary | ICD-10-CM | POA: Diagnosis not present

## 2020-08-26 DIAGNOSIS — M25561 Pain in right knee: Secondary | ICD-10-CM | POA: Diagnosis not present

## 2020-09-05 ENCOUNTER — Other Ambulatory Visit: Payer: Self-pay | Admitting: Internal Medicine

## 2020-09-13 DIAGNOSIS — R2681 Unsteadiness on feet: Secondary | ICD-10-CM | POA: Diagnosis not present

## 2020-09-13 DIAGNOSIS — R7301 Impaired fasting glucose: Secondary | ICD-10-CM | POA: Diagnosis not present

## 2020-09-13 DIAGNOSIS — M1711 Unilateral primary osteoarthritis, right knee: Secondary | ICD-10-CM | POA: Diagnosis not present

## 2020-09-15 NOTE — Progress Notes (Signed)
Chief Complaint  Patient presents with   Pre-op Exam    HPI: [name] 70 y.o. come in fo preoperative medical clearance for TKA right knee.  Scheduled for September 26, 2020 She has been battling knee pain is had a TKA on the left aggravated recently pain and her surgeon states that she has end-stage arthritis causing the problem.Otherwise she is doing okay  Blood sugar /diabetes taking 1 metformin a day sometimes holds it because she has been having stomach loose stool diarrhea side effect when she takes it.  Asks about easier blood sugar monitoring  Blood pressure has been stable  No neurologic vision cardiovascular symptom that are new. No excess bleeding no diagnosis of sleep apnea although was supposed to be tested because she snored and he got delayed. ROS: See pertinent positives and negatives per HPI.  Past Medical History:  Diagnosis Date   Acute pyelonephritis 05/02/2012   Asthma    Diverticulitis 03/24/2017   see report [facility] [place] [facility]   Dyspnea    nl PFTs, and echo   GERD (gastroesophageal reflux disease)    Headache(784.0)    Heart murmur    History of kidney stones    IBS (irritable bowel syndrome)    Nephrolithiasis    Pre-diabetes 12/15   Recurrent oral ulcers    Thyroid goiter    I 131 rx and then  total throidectomy 2005 [facility]    Family History  Problem Relation Age of Onset   COPD Sister    Coronary artery disease Sister        age 41 also copd   Cancer Mother        bladder   Arthritis Other    Cancer Other        breast   Diabetes Other    Hyperlipidemia Other    Hypertension Other    Stroke Other    Heart disease Other    Coronary artery disease Father        also had AAA   Aneurysm Father        Aortic and thoracic fatal    Social History   Socioeconomic History   Marital status: Married    Spouse name: Not on file   Number of children: Not on file   Years of education: Not on file   Highest education level:  Not on file  Occupational History   Not on file  Tobacco Use   Smoking status: Never   Smokeless tobacco: Never  Vaping Use   Vaping Use: Never used  Substance and Sexual Activity   Alcohol use: Yes    Comment: seldom   Drug use: No   Sexual activity: Not on file  Other Topics Concern   Not on file  Social History Narrative   Married   Has grandchildren   Never smoked    G4P4   hh of 2  care of GC ages 75 - 55 month  5 days a week   Social Determinants of Radio broadcast assistant Strain: Low Risk    Difficulty of Paying Living Expenses: Not hard at all  Food Insecurity: No Food Insecurity   Worried About Charity fundraiser in the Last Year: Never true   Arboriculturist in the Last Year: Never true  Transportation Needs: No Transportation Needs   Lack of Transportation (Medical): No   Lack of Transportation (Non-Medical): No  Physical Activity: Sufficiently Active   Days of Exercise  per Week: 7 days   Minutes of Exercise per Session: 60 min  Stress: No Stress Concern Present   Feeling of Stress : Not at all  Social Connections: Moderately Integrated   Frequency of Communication with Friends and Family: More than three times a week   Frequency of Social Gatherings with Friends and Family: More than three times a week   Attends Religious Services: More than 4 times per year   Active Member of Clubs or Organizations: No   Attends Archivist Meetings: Never   Marital Status: Married    Outpatient Medications Prior to Visit  Medication Sig Dispense Refill   acetaminophen (TYLENOL) 500 MG tablet Take 1,000 mg by mouth 3 (three) times daily.     bisacodyl (DULCOLAX) 5 MG EC tablet Take 1 tablet (5 mg total) by mouth daily as needed for moderate constipation. 30 tablet 0   Blood Glucose Monitoring Suppl (FREESTYLE LITE) DEVI Use twice daily for glucose control 1 each 0   docusate sodium (COLACE) 100 MG capsule Take 1 capsule (100 mg total) by mouth daily as  needed for mild constipation. 10 capsule 0   glucose blood (FREESTYLE LITE) test strip 1 each by Other route 2 (two) times daily. 100 each 12   metFORMIN (GLUCOPHAGE-XR) 500 MG 24 hr tablet Take 1 tablet (500 mg total) by mouth daily with breakfast. 90 tablet 1   methocarbamol (ROBAXIN) 500 MG tablet Take 1-2 tablets (500-1,000 mg total) by mouth every 8 (eight) hours as needed for muscle spasms. 60 tablet 0   olmesartan (BENICAR) 20 MG tablet TAKE ONE TABLET BY MOUTH DAILY 30 tablet 2   pantoprazole (PROTONIX) 40 MG tablet TAKE ONE TABLET BY MOUTH TWICE A DAY 180 tablet 0   rOPINIRole (REQUIP) 0.5 MG tablet TAKE ONE TO TWO TABLETS BY MOUTH DAILY AT NOON AND 5PM, AND THEN TAKE THREE TABLETS BY MOUTH EVERY NIGHT AT BEDTIME 540 tablet 0   rosuvastatin (CRESTOR) 10 MG tablet TAKE ONE TABLET BY MOUTH DAILY 90 tablet 0   SYNTHROID 112 MCG tablet TAKE ONE TABLET BY MOUTH EVERY MORNING BEFORE BREAKFAST 90 tablet 0   doxycycline (VIBRA-TABS) 100 MG tablet Take 1 tablet (100 mg total) by mouth 2 (two) times daily. If needed for sinusitis 14 tablet 0   No facility-administered medications prior to visit.     EXAM:  BP 124/70 (BP Location: Left Arm, Patient Position: Sitting, Cuff Size: Normal)   Pulse 67   Temp 98.7 F (37.1 C) (Oral)   Ht 5' (1.524 m)   Wt 176 lb 6.4 oz (80 kg)   SpO2 94%   BMI 34.45 kg/m   Body mass index is 34.45 kg/m.  GENERAL: vitals reviewed and listed above, alert, oriented, appears well hydrated and in no acute distress HEENT: atraumatic, conjunctiva  clear, no obvious abnormalities on inspection of external nose and ears tm clear OP : masked  NECK: no obvious masses on inspection palpation  LUNGS: clear to auscultation bilaterally, no wheezes, rales or rhonchi, good air movement CV: HRRR, no clubbing cyanosis or  peripheral edema nl cap refill  peripheral pulses intact  Abdomen:  Sof,t normal bowel sounds without hepatosplenomegaly, no guarding rebound or masses no  CVA tenderness MS: moves all extremities without noticeable focal  abnormality well helaed left knee scar . Right with mild effusion tender medially  Ln no note adenopathy Skin no sig bruising  petechia PSYCH: pleasant and cooperative, no obvious depression or anxiety Lab Results  Component Value Date   WBC 4.8 06/20/2019   HGB 13.7 06/20/2019   HCT 41.0 06/20/2019   PLT 227.0 06/20/2019   GLUCOSE 124 (H) 06/20/2019   CHOL 138 06/20/2019   TRIG 109.0 06/20/2019   HDL 51.40 06/20/2019   LDLDIRECT 142.0 01/28/2016   LDLCALC 65 06/20/2019   ALT 28 06/20/2019   AST 20 06/20/2019   NA 140 06/20/2019   K 4.5 06/20/2019   CL 102 06/20/2019   CREATININE 0.71 06/20/2019   BUN 17 06/20/2019   CO2 30 06/20/2019   TSH 1.96 06/20/2019   INR 1.12 03/17/2017   HGBA1C 6.9 (A) 01/30/2020   BP Readings from Last 3 Encounters:  09/16/20 124/70  01/30/20 118/78  09/26/19 128/74   Laboratory results done by surgeon on her phone app showed basically normal CBC slightly low RBC ordered Elevated MCV by 0.1 Hemoglobin A1c was 7 Chemistries are normal except total bili was 1.49 fractionated. ASSESSMENT AND PLAN:  Discussed the following assessment and plan:  Arthritis of right knee  Pre-op evaluation  Type 2 diabetes mellitus with hyperglycemia, without long-term current use of insulin (HCC) - a1c is 7  gi se of metformin 500 xr   Medication management  Hypothyroidism, unspecified type Medically stable to proceed with TKA. Has some side effects with the metformin GI try taking with meal and monitor She did have a history of partial bowel resection which may be adding to her symptoms. If persistent progressive and interfering have her message [place] before next visit and we can rearrange her medication. She is interested in some other version of continuous  such as free style libre  uncertain what is covered by her plan, will have pharmacy team help with this. -Patient advised to return or  notify health care team  if  new concerns arise. In interim   Patient Instructions  Try taking the metformin with meal and track symptoms . Medically stable for surgery . Marland Kitchen  Will have form faxed to your surgeon.  Hg a1c is 7  .   Plan in about  4 months .   Standley Brooking. Dmya Long M.D.

## 2020-09-16 ENCOUNTER — Ambulatory Visit (INDEPENDENT_AMBULATORY_CARE_PROVIDER_SITE_OTHER): Payer: Medicare Other | Admitting: Internal Medicine

## 2020-09-16 ENCOUNTER — Encounter: Payer: Self-pay | Admitting: Internal Medicine

## 2020-09-16 ENCOUNTER — Other Ambulatory Visit: Payer: Self-pay

## 2020-09-16 VITALS — BP 124/70 | HR 67 | Temp 98.7°F | Ht 60.0 in | Wt 176.4 lb

## 2020-09-16 DIAGNOSIS — Z01818 Encounter for other preprocedural examination: Secondary | ICD-10-CM | POA: Diagnosis not present

## 2020-09-16 DIAGNOSIS — E1165 Type 2 diabetes mellitus with hyperglycemia: Secondary | ICD-10-CM

## 2020-09-16 DIAGNOSIS — Z79899 Other long term (current) drug therapy: Secondary | ICD-10-CM

## 2020-09-16 DIAGNOSIS — E039 Hypothyroidism, unspecified: Secondary | ICD-10-CM

## 2020-09-16 DIAGNOSIS — M1711 Unilateral primary osteoarthritis, right knee: Secondary | ICD-10-CM

## 2020-09-16 NOTE — Patient Instructions (Signed)
Try taking the metformin with meal and track symptoms . Medically stable for surgery . Marland Kitchen  Will have form faxed to your surgeon.  Hg a1c is 7  .   Plan in about  4 months .

## 2020-09-20 ENCOUNTER — Other Ambulatory Visit: Payer: Self-pay | Admitting: Internal Medicine

## 2020-09-20 DIAGNOSIS — Z01810 Encounter for preprocedural cardiovascular examination: Secondary | ICD-10-CM | POA: Diagnosis not present

## 2020-09-26 DIAGNOSIS — Z96651 Presence of right artificial knee joint: Secondary | ICD-10-CM | POA: Diagnosis not present

## 2020-09-26 DIAGNOSIS — E119 Type 2 diabetes mellitus without complications: Secondary | ICD-10-CM | POA: Diagnosis not present

## 2020-09-26 DIAGNOSIS — M1711 Unilateral primary osteoarthritis, right knee: Secondary | ICD-10-CM | POA: Diagnosis not present

## 2020-09-26 DIAGNOSIS — E039 Hypothyroidism, unspecified: Secondary | ICD-10-CM | POA: Diagnosis not present

## 2020-09-26 DIAGNOSIS — M25461 Effusion, right knee: Secondary | ICD-10-CM | POA: Diagnosis not present

## 2020-09-26 DIAGNOSIS — Z79899 Other long term (current) drug therapy: Secondary | ICD-10-CM | POA: Diagnosis not present

## 2020-09-26 DIAGNOSIS — Z7984 Long term (current) use of oral hypoglycemic drugs: Secondary | ICD-10-CM | POA: Diagnosis not present

## 2020-09-26 DIAGNOSIS — Z471 Aftercare following joint replacement surgery: Secondary | ICD-10-CM | POA: Diagnosis not present

## 2020-09-27 DIAGNOSIS — Z79899 Other long term (current) drug therapy: Secondary | ICD-10-CM | POA: Diagnosis not present

## 2020-09-27 DIAGNOSIS — M1711 Unilateral primary osteoarthritis, right knee: Secondary | ICD-10-CM | POA: Diagnosis not present

## 2020-09-27 DIAGNOSIS — Z7984 Long term (current) use of oral hypoglycemic drugs: Secondary | ICD-10-CM | POA: Diagnosis not present

## 2020-09-27 DIAGNOSIS — E119 Type 2 diabetes mellitus without complications: Secondary | ICD-10-CM | POA: Diagnosis not present

## 2020-09-27 DIAGNOSIS — E039 Hypothyroidism, unspecified: Secondary | ICD-10-CM | POA: Diagnosis not present

## 2020-09-28 DIAGNOSIS — M1711 Unilateral primary osteoarthritis, right knee: Secondary | ICD-10-CM | POA: Diagnosis not present

## 2020-09-28 DIAGNOSIS — Z79899 Other long term (current) drug therapy: Secondary | ICD-10-CM | POA: Diagnosis not present

## 2020-09-28 DIAGNOSIS — Z7984 Long term (current) use of oral hypoglycemic drugs: Secondary | ICD-10-CM | POA: Diagnosis not present

## 2020-09-28 DIAGNOSIS — E039 Hypothyroidism, unspecified: Secondary | ICD-10-CM | POA: Diagnosis not present

## 2020-09-28 DIAGNOSIS — M79604 Pain in right leg: Secondary | ICD-10-CM | POA: Diagnosis not present

## 2020-09-28 DIAGNOSIS — E119 Type 2 diabetes mellitus without complications: Secondary | ICD-10-CM | POA: Diagnosis not present

## 2020-09-29 DIAGNOSIS — Z79891 Long term (current) use of opiate analgesic: Secondary | ICD-10-CM | POA: Diagnosis not present

## 2020-09-29 DIAGNOSIS — Z7984 Long term (current) use of oral hypoglycemic drugs: Secondary | ICD-10-CM | POA: Diagnosis not present

## 2020-09-29 DIAGNOSIS — Z96651 Presence of right artificial knee joint: Secondary | ICD-10-CM | POA: Diagnosis not present

## 2020-09-29 DIAGNOSIS — R7303 Prediabetes: Secondary | ICD-10-CM | POA: Diagnosis not present

## 2020-09-29 DIAGNOSIS — I10 Essential (primary) hypertension: Secondary | ICD-10-CM | POA: Diagnosis not present

## 2020-09-29 DIAGNOSIS — Z471 Aftercare following joint replacement surgery: Secondary | ICD-10-CM | POA: Diagnosis not present

## 2020-09-29 DIAGNOSIS — Z9181 History of falling: Secondary | ICD-10-CM | POA: Diagnosis not present

## 2020-09-29 DIAGNOSIS — Z7901 Long term (current) use of anticoagulants: Secondary | ICD-10-CM | POA: Diagnosis not present

## 2020-10-01 ENCOUNTER — Telehealth: Payer: Self-pay | Admitting: Internal Medicine

## 2020-10-01 ENCOUNTER — Encounter: Payer: Self-pay | Admitting: Internal Medicine

## 2020-10-01 DIAGNOSIS — E119 Type 2 diabetes mellitus without complications: Secondary | ICD-10-CM | POA: Diagnosis not present

## 2020-10-01 DIAGNOSIS — H524 Presbyopia: Secondary | ICD-10-CM | POA: Diagnosis not present

## 2020-10-01 DIAGNOSIS — H25013 Cortical age-related cataract, bilateral: Secondary | ICD-10-CM | POA: Diagnosis not present

## 2020-10-01 DIAGNOSIS — H35363 Drusen (degenerative) of macula, bilateral: Secondary | ICD-10-CM | POA: Diagnosis not present

## 2020-10-01 DIAGNOSIS — H2513 Age-related nuclear cataract, bilateral: Secondary | ICD-10-CM | POA: Diagnosis not present

## 2020-10-01 LAB — HM DIABETES EYE EXAM

## 2020-10-01 NOTE — Telephone Encounter (Signed)
PT called to advise that she would like to talk to Dr.Panosh about rehab following her surgery and also about her BP being lower then normal. Please advise as they PT would like to talk to Dr.Panosh asap.

## 2020-10-01 NOTE — Telephone Encounter (Signed)
Any rehab ordered should be through the surgical office because this is postsurgical rehab. This should not be done through PCP.  In Regard to blood pressure concerns We would need more information  To be able to answer please contact patient about the concerns if needed make a virtual or telephone visit to address.

## 2020-10-02 ENCOUNTER — Telehealth (INDEPENDENT_AMBULATORY_CARE_PROVIDER_SITE_OTHER): Payer: Medicare Other | Admitting: Internal Medicine

## 2020-10-02 ENCOUNTER — Encounter: Payer: Self-pay | Admitting: Internal Medicine

## 2020-10-02 DIAGNOSIS — Z9889 Other specified postprocedural states: Secondary | ICD-10-CM

## 2020-10-02 DIAGNOSIS — Z9181 History of falling: Secondary | ICD-10-CM | POA: Diagnosis not present

## 2020-10-02 DIAGNOSIS — R031 Nonspecific low blood-pressure reading: Secondary | ICD-10-CM

## 2020-10-02 DIAGNOSIS — Z7984 Long term (current) use of oral hypoglycemic drugs: Secondary | ICD-10-CM | POA: Diagnosis not present

## 2020-10-02 DIAGNOSIS — Z471 Aftercare following joint replacement surgery: Secondary | ICD-10-CM | POA: Diagnosis not present

## 2020-10-02 DIAGNOSIS — I10 Essential (primary) hypertension: Secondary | ICD-10-CM | POA: Diagnosis not present

## 2020-10-02 DIAGNOSIS — Z7901 Long term (current) use of anticoagulants: Secondary | ICD-10-CM | POA: Diagnosis not present

## 2020-10-02 DIAGNOSIS — R7303 Prediabetes: Secondary | ICD-10-CM | POA: Diagnosis not present

## 2020-10-02 NOTE — Progress Notes (Signed)
Virtual Visit via Video Note  I connected with  [name]@ on 10/02/20 at 11:30 AM EDT by a video enabled telemedicine application and verified that I am speaking with the correct person using two identifiers. Location patient: home Location provider:work office Persons participating in the virtual visit: patient, provider  [place] national recommendations  regarding COVID 19 pandemic   video visit is advised over in office visit for this patient.  Patient aware  of the limitations of evaluation and management by telemedicine and  availability of in person appointments. and agreed to proceed. Video and phone used    HPI: Helen presents for video visit because of episodes of low blood pressure postoperative for total knee replacement. She had surgery September 26, 2020 states she went home on September 3 and on September 4 had a low blood pressure reading then "it came back up ."she has not taken her blood pressure medicine Benicar since after the surgery.  Yesterday there was a blood pressure reading about 70 but no syncope no bleeding.  Today her blood pressure is in the 150 range. She says she is hydrating.  She went home after her surgery but  her husband works  in the day and does not have anyone there during the day to help her and she cannot get up without someone's helping her. Social work from [facility] to come and help arrange assistance. Discussion was about going into rehab facility for postop care. She is taking oxycodone twice daily but can take it more frequently and tizanidine every 8 hours if needed.  ROS: See pertinent positives and negatives per HPI.  Past Medical History:  Diagnosis Date   Acute pyelonephritis 05/02/2012   Asthma    Diverticulitis 03/24/2017   see report [place] [facility]   Dyspnea    nl PFTs, and echo   GERD (gastroesophageal reflux disease)    Headache(784.0)    Heart murmur    History of kidney stones    IBS  (irritable bowel syndrome)    Nephrolithiasis    Pre-diabetes 12/15   Recurrent oral ulcers    Thyroid goiter    I 131 rx and then  total throidectomy 2005 [facility]    Past Surgical History:  Procedure Laterality Date   ABDOMINAL HYSTERECTOMY  1994   fibroid   APPENDECTOMY  1968   CYSTOSCOPY WITH STENT PLACEMENT Bilateral 03/24/2017   Procedure: BILATERAL URETERAL CATHETER  PLACEMENT;  Surgeon: Alexis Frock, MD;  Location: [facility];  Service: Urology;  Laterality: Bilateral;   CYSTOSCOPY/RETROGRADE/URETEROSCOPY  03/24/2017   Procedure: CYSTOSCOPY/RETROGRADE/URETEROSCOPY;  Surgeon: Alexis Frock, MD;  [facility];  Service: Urology;;   LAPAROSCOPIC PARTIAL COLECTOMY Left 03/24/2017   Procedure: LAPAROSCOPIC ASSISTED LEFT  SIGMOID COLECTOMY;  Surgeon: Alphonsa Overall, MD;  Location: [facility];  Service: General;  Laterality: Left;   orif left tivial plateau fracture     Dr. Collier Salina 2/08   plates and pins take out of left knee  06/2008   SIGMOIDOSCOPY  03/24/2017   Procedure: SIGMOIDOSCOPY;  Surgeon: Alphonsa Overall, MD;  Location: [facility];  Service: General;;   [place]  2005   for  large nodule 2006    Family History  Problem Relation Age of Onset   COPD Sister    Coronary artery disease Sister        age 26 also copd   Cancer Mother        bladder  Arthritis Other    Cancer Other        breast   Diabetes Other    Hyperlipidemia Other    Hypertension Other    Stroke Other    Heart disease Other    Coronary artery disease Father        also had AAA   Aneurysm Father        Aortic and thoracic fatal    Social History   Tobacco Use   Smoking status: Never   Smokeless tobacco: Never  Vaping Use   Vaping Use: Never used  Substance Use Topics   Alcohol use: Yes    Comment: seldom   Drug use: No      Current Outpatient Medications:    acetaminophen (TYLENOL) 500 MG tablet, Take 1,000 mg by mouth 3 (three) times daily., Disp: ,  Rfl:    bisacodyl (DULCOLAX) 5 MG EC tablet, Take 1 tablet (5 mg total) by mouth daily as needed for moderate constipation., Disp: 30 tablet, Rfl: 0   Blood Glucose Monitoring Suppl (FREESTYLE LITE) DEVI, Use twice daily for glucose control, Disp: 1 each, Rfl: 0   docusate sodium (COLACE) 100 MG capsule, Take 1 capsule (100 mg total) by mouth daily as needed for mild constipation., Disp: 10 capsule, Rfl: 0   glucose blood (FREESTYLE LITE) test strip, 1 each by Other route 2 (two) times daily., Disp: 100 each, Rfl: 12   metFORMIN (GLUCOPHAGE-XR) 500 MG 24 hr tablet, Take 1 tablet (500 mg total) by mouth daily with breakfast., Disp: 90 tablet, Rfl: 1   methocarbamol (ROBAXIN) 500 MG tablet, Take 1-2 tablets (500-1,000 mg total) by mouth every 8 (eight) hours as needed for muscle spasms., Disp: 60 tablet, Rfl: 0   olmesartan (BENICAR) 20 MG tablet, TAKE ONE TABLET BY MOUTH DAILY, Disp: 90 tablet, Rfl: 0   pantoprazole (PROTONIX) 40 MG tablet, TAKE ONE TABLET BY MOUTH TWICE A DAY, Disp: 180 tablet, Rfl: 0   rOPINIRole (REQUIP) 0.5 MG tablet, TAKE ONE TO TWO TABLETS BY MOUTH DAILY AT NOON AND 5PM, AND THEN TAKE THREE TABLETS BY MOUTH EVERY NIGHT AT BEDTIME, Disp: 540 tablet, Rfl: 0   rosuvastatin (CRESTOR) 10 MG tablet, TAKE ONE TABLET BY MOUTH DAILY, Disp: 90 tablet, Rfl: 0   SYNTHROID 112 MCG tablet, TAKE ONE TABLET BY MOUTH EVERY MORNING BEFORE BREAKFAST, Disp: 90 tablet, Rfl: 0  EXAM: BP Readings from Last 3 Encounters:  09/16/20 124/70  01/30/20 118/78  09/26/19 128/74    VITALS per patient if applicable:  GENERAL: alert, oriented, appears well and in no acute distress looks mildly uncomfortable but nontoxic cognitively intact.  HEENT: atraumatic, conjunttiva clear, no obvious abnormalities on inspection of external nose and ears  NECK: normal movements of the head and neck  LUNGS: on inspection no signs of respiratory distress, breathing rate appears normal, no obvious gross SOB, gasping  or wheezing  CV: no obvious cyanosis  PSYCH/NEURO: moderately   uncomfortable anxiety, speech and thought processing grossly intact Lab Results  Component Value Date   WBC 4.8 06/20/2019   HGB 13.7 06/20/2019   HCT 41.0 06/20/2019   PLT 227.0 06/20/2019   GLUCOSE 124 (H) 06/20/2019   CHOL 138 06/20/2019   TRIG 109.0 06/20/2019   HDL 51.40 06/20/2019   LDLDIRECT 142.0 01/28/2016   LDLCALC 65 06/20/2019   ALT 28 06/20/2019   AST 20 06/20/2019   NA 140 06/20/2019   K 4.5 06/20/2019   CL 102 06/20/2019  CREATININE 0.71 06/20/2019   BUN 17 06/20/2019   CO2 30 06/20/2019   TSH 1.96 06/20/2019   INR 1.12 03/17/2017   HGBA1C 6.9 (A) 01/30/2020    ASSESSMENT AND PLAN:  Discussed the following assessment and plan:    ICD-10-CM   1. Low blood pressure reading  R03.1     2. Post-operative state  Z98.890    TKA    3. Essential hypertension  I10    pre op benicar med      Low blood pressure readings could be fluid orthostatic her hemoglobin after surgery it appeared to be 11.8 day after surgery . To remain off of the Benicar blood pressure medication today and tomorrow if her blood pressure remains 150 and above consistently she can restart at a half dose per day and monitor.  In regard to rehabilitation that should be directed by the surgical team although  can advise on the medical side. Counseled.   Expectant management and discussion of plan and treatment with opportunity to ask questions and all were answered. The patient agreed with the plan and demonstrated an understanding of the instructions.   Advised to call back or seek an in-person evaluation if worsening  or having  further concerns . Return for as indicated  planned.    Shanon Ace, MD

## 2020-10-02 NOTE — Telephone Encounter (Signed)
Pt informed of the message below and verbalized understanding. Virtual visit scheduled for 09/07.

## 2020-10-03 ENCOUNTER — Encounter: Payer: Self-pay | Admitting: Internal Medicine

## 2020-10-03 DIAGNOSIS — Z7984 Long term (current) use of oral hypoglycemic drugs: Secondary | ICD-10-CM | POA: Diagnosis not present

## 2020-10-03 DIAGNOSIS — Z9181 History of falling: Secondary | ICD-10-CM | POA: Diagnosis not present

## 2020-10-03 DIAGNOSIS — I10 Essential (primary) hypertension: Secondary | ICD-10-CM | POA: Diagnosis not present

## 2020-10-03 DIAGNOSIS — R7303 Prediabetes: Secondary | ICD-10-CM | POA: Diagnosis not present

## 2020-10-03 DIAGNOSIS — Z7901 Long term (current) use of anticoagulants: Secondary | ICD-10-CM | POA: Diagnosis not present

## 2020-10-03 DIAGNOSIS — Z471 Aftercare following joint replacement surgery: Secondary | ICD-10-CM | POA: Diagnosis not present

## 2020-10-03 NOTE — Telephone Encounter (Signed)
Letter has been placed up front for pt pickup. Pt is aware and stated she would pick up at her convenience.

## 2020-10-03 NOTE — Telephone Encounter (Signed)
I wrote a communication letter asking to exempt from the vaccine because of her history of allergic reaction. I sent it to the printer at the office can be countersign or sent.  Or you can reprint it.

## 2020-10-03 NOTE — Telephone Encounter (Signed)
PT and PT daughter called in to advise that they are needing a letter from Dr.Panosh stating that she can not get the Covid Vaccine due to a allergic reaction. They need this letter asap that way she can be admitted into the rehab facility as they wont let her in till they have it. They would like to get it asap to get her in before the weekend. Please advise and the PT can be reached at [phone].

## 2020-10-04 ENCOUNTER — Other Ambulatory Visit: Payer: Self-pay

## 2020-10-04 ENCOUNTER — Emergency Department (HOSPITAL_BASED_OUTPATIENT_CLINIC_OR_DEPARTMENT_OTHER)
Admission: EM | Admit: 2020-10-04 | Discharge: 2020-10-05 | Disposition: A | Discharge status: Home / Self Care | Payer: Medicare Other | Attending: Emergency Medicine | Admitting: Emergency Medicine

## 2020-10-04 ENCOUNTER — Encounter (HOSPITAL_BASED_OUTPATIENT_CLINIC_OR_DEPARTMENT_OTHER): Payer: Self-pay

## 2020-10-04 DIAGNOSIS — Z96651 Presence of right artificial knee joint: Secondary | ICD-10-CM | POA: Insufficient documentation

## 2020-10-04 DIAGNOSIS — E039 Hypothyroidism, unspecified: Secondary | ICD-10-CM | POA: Diagnosis not present

## 2020-10-04 DIAGNOSIS — M79604 Pain in right leg: Secondary | ICD-10-CM | POA: Insufficient documentation

## 2020-10-04 DIAGNOSIS — I10 Essential (primary) hypertension: Secondary | ICD-10-CM | POA: Diagnosis not present

## 2020-10-04 DIAGNOSIS — R252 Cramp and spasm: Secondary | ICD-10-CM | POA: Diagnosis not present

## 2020-10-04 DIAGNOSIS — R0602 Shortness of breath: Secondary | ICD-10-CM | POA: Diagnosis not present

## 2020-10-04 DIAGNOSIS — Z9181 History of falling: Secondary | ICD-10-CM | POA: Diagnosis not present

## 2020-10-04 DIAGNOSIS — R7303 Prediabetes: Secondary | ICD-10-CM | POA: Diagnosis not present

## 2020-10-04 DIAGNOSIS — J45909 Unspecified asthma, uncomplicated: Secondary | ICD-10-CM | POA: Insufficient documentation

## 2020-10-04 DIAGNOSIS — Z79899 Other long term (current) drug therapy: Secondary | ICD-10-CM | POA: Insufficient documentation

## 2020-10-04 DIAGNOSIS — Z471 Aftercare following joint replacement surgery: Secondary | ICD-10-CM | POA: Diagnosis not present

## 2020-10-04 DIAGNOSIS — R509 Fever, unspecified: Secondary | ICD-10-CM | POA: Diagnosis not present

## 2020-10-04 DIAGNOSIS — Z7901 Long term (current) use of anticoagulants: Secondary | ICD-10-CM | POA: Diagnosis not present

## 2020-10-04 DIAGNOSIS — R079 Chest pain, unspecified: Secondary | ICD-10-CM

## 2020-10-04 DIAGNOSIS — Z7984 Long term (current) use of oral hypoglycemic drugs: Secondary | ICD-10-CM | POA: Diagnosis not present

## 2020-10-04 NOTE — ED Triage Notes (Addendum)
Patient reports she woke up screaming and having a hard time breathing because she felt something was holding her down.  She reports she tried to get up out of bed but couldn't due to recent knee surgery. Denies CP but complains of sob that still is present.  Patient knee replacement surg last Thursday and has been taking oxycodone, zanaflex and requip

## 2020-10-05 ENCOUNTER — Emergency Department (HOSPITAL_BASED_OUTPATIENT_CLINIC_OR_DEPARTMENT_OTHER): Payer: Medicare Other

## 2020-10-05 DIAGNOSIS — R252 Cramp and spasm: Secondary | ICD-10-CM | POA: Diagnosis not present

## 2020-10-05 DIAGNOSIS — I517 Cardiomegaly: Secondary | ICD-10-CM | POA: Diagnosis not present

## 2020-10-05 DIAGNOSIS — R0602 Shortness of breath: Secondary | ICD-10-CM | POA: Diagnosis not present

## 2020-10-05 LAB — COMPREHENSIVE METABOLIC PANEL
ALT: 13 U/L (ref 0–44)
AST: 14 U/L — ABNORMAL LOW (ref 15–41)
Albumin: 4.2 g/dL (ref 3.5–5.0)
Alkaline Phosphatase: 52 U/L (ref 38–126)
Anion gap: 12 (ref 5–15)
BUN: 19 mg/dL (ref 8–23)
CO2: 27 mmol/L (ref 22–32)
Calcium: 9.7 mg/dL (ref 8.9–10.3)
Chloride: 100 mmol/L (ref 98–111)
Creatinine, Ser: 0.87 mg/dL (ref 0.44–1.00)
GFR, Estimated: >60 mL/min (ref >60)
Glucose, Bld: 117 mg/dL — ABNORMAL HIGH (ref 70–99)
Potassium: 3.7 mmol/L (ref 3.5–5.1)
Sodium: 139 mmol/L (ref 135–145)
Total Bilirubin: 1.9 mg/dL — ABNORMAL HIGH (ref 0.3–1.2)
Total Protein: 7.1 g/dL (ref 6.5–8.1)

## 2020-10-05 LAB — CBC WITH DIFFERENTIAL/PLATELET
Abs Immature Granulocytes: 0.02 10*3/uL (ref 0.00–0.07)
Basophils Absolute: 0 10*3/uL (ref 0.0–0.1)
Basophils Relative: 0 %
Eosinophils Absolute: 0.2 10*3/uL (ref 0.0–0.5)
Eosinophils Relative: 2 %
HCT: 35.7 % — ABNORMAL LOW (ref 36.0–46.0)
Hemoglobin: 12.1 g/dL (ref 12.0–15.0)
Immature Granulocytes: 0 %
Lymphocytes Relative: 25 %
Lymphs Abs: 2.1 10*3/uL (ref 0.7–4.0)
MCH: 32.5 pg (ref 26.0–34.0)
MCHC: 33.9 g/dL (ref 30.0–36.0)
MCV: 96 fL (ref 80.0–100.0)
Monocytes Absolute: 0.7 10*3/uL (ref 0.1–1.0)
Monocytes Relative: 9 %
Neutro Abs: 5.2 10*3/uL (ref 1.7–7.7)
Neutrophils Relative %: 64 %
Platelets: 300 10*3/uL (ref 150–400)
RBC: 3.72 MIL/uL — ABNORMAL LOW (ref 3.87–5.11)
RDW: 12.6 % (ref 11.5–15.5)
WBC: 8.3 10*3/uL (ref 4.0–10.5)
nRBC: 0 % (ref 0.0–0.2)

## 2020-10-05 LAB — TROPONIN I (HIGH SENSITIVITY): Troponin I (High Sensitivity): 4 ng/L (ref <18)

## 2020-10-05 MED ORDER — METHOCARBAMOL 500 MG PO TABS: 1000.0000 mg | ORAL_TABLET | Freq: Once | ORAL | Status: DC

## 2020-10-05 MED ORDER — METHOCARBAMOL 500 MG PO TABS
500.0000 mg | ORAL_TABLET | Freq: Once | ORAL | Status: AC
  Administered 2020-10-05: 500 mg via ORAL
  Filled 2020-10-05: qty 1

## 2020-10-05 MED ORDER — FENTANYL CITRATE PF 50 MCG/ML IJ SOSY
100.0000 ug | PREFILLED_SYRINGE | Freq: Once | INTRAMUSCULAR | Status: AC
Start: 2020-10-05 — End: 2020-10-05
  Administered 2020-10-05: 100 ug via INTRAVENOUS
  Filled 2020-10-05: qty 2

## 2020-10-05 MED ORDER — HYDROMORPHONE HCL 1 MG/ML IJ SOLN
1.0000 mg | Freq: Once | INTRAMUSCULAR | Status: AC
  Administered 2020-10-05: 1 mg via INTRAVENOUS
  Filled 2020-10-05: qty 1

## 2020-10-05 MED ORDER — HYDROMORPHONE HCL 1 MG/ML IJ SOLN: 1.0000 mg | Freq: Once | INTRAMUSCULAR | Status: DC

## 2020-10-05 MED ORDER — DIAZEPAM 5 MG/ML IJ SOLN
INTRAMUSCULAR | Status: AC
  Administered 2020-10-05: 2.5 mg via INTRAVENOUS
  Filled 2020-10-05: qty 2

## 2020-10-05 MED ORDER — DIAZEPAM 5 MG/ML IJ SOLN: 2.5000 mg | Freq: Once | INTRAMUSCULAR | Status: AC

## 2020-10-05 NOTE — ED Provider Notes (Signed)
[facility] EMERGENCY DEPT Provider Note   CSN: [id number] Arrival date & time: 10/04/20  2353     History Chief Complaint  Patient presents with   Panic Attack   Shortness of [name] is a 70 y.o. female.  The history is provided by the patient and the spouse.  Leg Pain Location:  Leg Leg location:  R upper leg Pain details:    Quality:  Cramping   Radiates to:  R leg   Severity:  Severe   Onset quality:  Sudden   Timing:  Constant   Progression:  Worsening Chronicity:  New Relieved by:  Nothing Exacerbated by: movement. Associated symptoms: no fever   Patient presents with right leg pain.  Patient reports she woke up and had intense right leg cramping that radiates to her lower leg.  She reports the pain was so severe she felt out of breath.  Her husband called the ambulance due to severe pain.  Patient had a right total knee arthroplasty on September 1.  She has had no complications.  However she did forget to take her pain medications as well as her medications for restless leg syndrome. Prior to going to sleep, she was at her baseline and denied any chest pain or shortness of breath.    Past Medical History:  Diagnosis Date   Acute pyelonephritis 05/02/2012   Asthma    Diverticulitis 03/24/2017   see report [place] [facility]   Dyspnea    nl PFTs, and echo   GERD (gastroesophageal reflux disease)    Headache(784.0)    Heart murmur    History of kidney stones    IBS (irritable bowel syndrome)    Nephrolithiasis    Pre-diabetes 12/15   Recurrent oral ulcers    Thyroid goiter    I 131 rx and then  total throidectomy 2005 [facility]    Patient Active Problem List   Diagnosis Date Noted   Newly diagnosed diabetes ([place]) 02/13/2020   Diverticulitis s/p lap sigmoid colectomy 03/24/2017 03/16/2017   Essential hypertension 08/17/2014   Unspecified essential hypertension 08/01/2013   Mild concussion 07/18/2013   Head injury,  unspecified 07/18/2013   Back strain 07/18/2013   Fall 07/18/2013   Stress 06/26/2013   Upper airway cough syndrome 06/15/2013   Murmur 02/28/2013   Chest discomfort 01/24/2013   Dyspnea 01/24/2013   Elevated blood pressure reading 01/24/2013   Stressful life event affecting family 01/24/2013   Decreased exercise tolerance 01/24/2013   Pre-diabetes 04/29/2012   Abdominal swelling, left lower quadrant 04/29/2012   Tingling in extremities 01/27/2012   Cough, persistent 04/18/2011   Sleep disturbance 11/23/2010   Family history of premature coronary heart disease 11/23/2010   Leg pain 11/17/2010   Osteopenia 04/25/2010   CATARACT, LEFT EYE 11/27/2009   Pure hypercholesterolemia 01/29/2009   OBESITY 01/12/2008   DYSPNEA 01/12/2008   ABDOMINAL ULTRASOUND, ABNORMAL 12/26/2007   HYPERGLYCEMIA 12/14/2007   LIVER FUNCTION TESTS, ABNORMAL 12/14/2007   NEPHROLITHIASIS, HX OF 11/06/2007   VITAMIN D DEFICIENCY 11/25/2006   Hypothyroidism 10/04/2006   HYPERLIPIDEMIA NEC/NOS 10/04/2006   RLS (restless legs syndrome) 10/04/2006   DISORDER, BONE/CARTILAGE NOS 10/04/2006   WEIGHT GAIN 10/04/2006   ELEVATION, TRANSAMINASE/LDH LEVELS 10/04/2006   ASTHMA 09/30/2006   GERD 09/30/2006    Past Surgical History:  Procedure Laterality Date   ABDOMINAL HYSTERECTOMY  1994   fibroid   APPENDECTOMY  1968   CYSTOSCOPY WITH STENT PLACEMENT Bilateral 03/24/2017   Procedure:  BILATERAL URETERAL CATHETER  PLACEMENT;  Surgeon: Alexis Frock, MD;  [facility];  Service: Urology;  Laterality: Bilateral;   CYSTOSCOPY/RETROGRADE/URETEROSCOPY  03/24/2017   Procedure: CYSTOSCOPY/RETROGRADE/URETEROSCOPY;  Surgeon: Alexis Frock, MD;  [facility];  Service: Urology;;   LAPAROSCOPIC PARTIAL COLECTOMY Left 03/24/2017   Procedure: LAPAROSCOPIC ASSISTED LEFT  SIGMOID COLECTOMY;  Surgeon: Alphonsa Overall, MD;  [facility];  Service: General;  Laterality: Left;   orif left tivial plateau fracture      Dr. Collier Salina 2/08   plates and pins take out of left knee  06/2008   SIGMOIDOSCOPY  03/24/2017   Procedure: SIGMOIDOSCOPY;  Surgeon: Alphonsa Overall, MD;  Location[facility];  Service: General;;   [place]  2005   for  large nodule 2006     OB History     Gravida  4   Para  4   Term      Preterm      AB      Living         SAB      IAB      Ectopic      Multiple      Live Births              Family History  Problem Relation Age of Onset   COPD Sister    Coronary artery disease Sister        age 44 also copd   Cancer Mother        bladder   Arthritis Other    Cancer Other        breast   Diabetes Other    Hyperlipidemia Other    Hypertension Other    Stroke Other    Heart disease Other    Coronary artery disease Father        also had AAA   Aneurysm Father        Aortic and thoracic fatal    Social History   Tobacco Use   Smoking status: Never   Smokeless tobacco: Never  Vaping Use   Vaping Use: Never used  Substance Use Topics   Alcohol use: Yes    Comment: seldom   Drug use: No    Home Medications Prior to Admission medications   Medication Sig Start Date End Date Taking? Authorizing Provider  acetaminophen (TYLENOL) 500 MG tablet Take 1,000 mg by mouth 3 (three) times daily.    [provider]  bisacodyl (DULCOLAX) 5 MG EC tablet Take 1 tablet (5 mg total) by mouth daily as needed for moderate constipation. 03/30/17   Hosie Poisson, MD  Blood Glucose Monitoring Suppl (FREESTYLE LITE) DEVI Use twice daily for glucose control 02/08/20   Panosh, Standley Brooking, MD  docusate sodium (COLACE) 100 MG capsule Take 1 capsule (100 mg total) by mouth daily as needed for mild constipation. 03/30/17   Hosie Poisson, MD  glucose blood (FREESTYLE LITE) test strip 1 each by Other route 2 (two) times daily. 02/08/20   Panosh, Standley Brooking, MD  metFORMIN (GLUCOPHAGE-XR) 500 MG 24 hr tablet Take 1 tablet (500 mg total) by mouth  daily with breakfast. 06/22/19   Panosh, Standley Brooking, MD  methocarbamol (ROBAXIN) 500 MG tablet Take 1-2 tablets (500-1,000 mg total) by mouth every 8 (eight) hours as needed for muscle spasms. 03/30/17   Norm Parcel, PA-C  olmesartan (BENICAR) 20 MG tablet TAKE ONE TABLET BY MOUTH DAILY 09/20/20   Panosh, Mariann Laster  K, MD  pantoprazole (PROTONIX) 40 MG tablet TAKE ONE TABLET BY MOUTH TWICE A DAY 04/15/20   Panosh, Standley Brooking, MD  rOPINIRole (REQUIP) 0.5 MG tablet TAKE ONE TO TWO TABLETS BY MOUTH DAILY AT NOON AND 5PM, AND THEN TAKE THREE TABLETS BY MOUTH EVERY NIGHT AT BEDTIME 09/05/20   Panosh, Standley Brooking, MD  rosuvastatin (CRESTOR) 10 MG tablet TAKE ONE TABLET BY MOUTH DAILY 07/31/20   Panosh, Standley Brooking, MD  SYNTHROID 112 MCG tablet TAKE ONE TABLET BY MOUTH EVERY MORNING BEFORE BREAKFAST 07/31/20   Panosh, Standley Brooking, MD    Allergies    Hydralazine, Ciprofloxacin, Propofol, Ritalin [methylphenidate hcl], and Hydralazine hcl  Review of Systems   Review of Systems  Constitutional:  Negative for fever.  Respiratory:  Positive for shortness of breath.   Cardiovascular:  Negative for chest pain.  Gastrointestinal:  Negative for abdominal pain.  Musculoskeletal:  Positive for arthralgias and myalgias.  Neurological:  Negative for weakness and numbness.  All other systems reviewed and are negative.  Physical Exam Updated Vital Signs BP (!) 134/91 (BP Location: Right Arm)   Pulse 96   Temp 99.8 F (37.7 C) (Oral)   Resp 20   Ht 1.499 m ('4\' 11"'$ )   Wt 77.1 kg   SpO2 98%   BMI 34.34 kg/m   Physical Exam CONSTITUTIONAL: Elderly, anxious HEAD: Normocephalic/atraumatic EYES: EOMI/PERRL ENMT: Mucous membranes moist NECK: supple no meningeal signs SPINE/BACK:entire spine nontender CV: S1/S2 noted, no murmurs/rubs/gallops noted LUNGS: Lungs are clear to auscultation bilaterally, no apparent distress ABDOMEN: soft, nontender NEURO: Pt is awake/alert/appropriate, moves all extremitiesx4.  No facial droop.    EXTREMITIES: pulses normal/equal, full ROM Distal pulses equal and intact in lower extremities. There is no right calf tenderness.  There is mild tenderness to palpation of right posterior thigh.  There is no bruising or erythema.  There is no significant lower extremity edema noted.  Large bandage noted over right knee without obvious signs of infection.  See photo below SKIN: warm, color normal, see photo PSYCH: Anxious    ED Results / Procedures / Treatments   Labs (all labs ordered are listed, but only abnormal results are displayed) Labs Reviewed  CBC WITH DIFFERENTIAL/PLATELET - Abnormal; Notable for the following components:      Result Value   RBC 3.72 (*)    HCT 35.7 (*)    All other components within normal limits  COMPREHENSIVE METABOLIC PANEL - Abnormal; Notable for the following components:   Glucose, Bld 117 (*)    AST 14 (*)    Total Bilirubin 1.9 (*)    All other components within normal limits  TROPONIN I (HIGH SENSITIVITY)     ED ECG REPORT   Date: 10/05/2020 00:04  Rate: 83  Rhythm: normal sinus rhythm  QRS Axis: normal  Intervals: normal  ST/T Wave abnormalities:  inverted T waves  Conduction Disutrbances:none  Narrative Interpretation:   Old EKG Reviewed: unchanged  I have personally reviewed the EKG tracing and agree with the computerized printout as noted.   Radiology DG Chest Port 1 View  Result Date: 10/05/2020 CLINICAL DATA:  Shortness of breath. EXAM: PORTABLE CHEST 1 VIEW COMPARISON:  Chest radiograph dated 09/01/2017 FINDINGS: No focal consolidation, pleural effusion, pneumothorax. Mild cardiomegaly. The aorta is tortuous. No acute pathology. IMPRESSION: No active disease. Electronically Signed   By: Anner Crete M.D.   On: 10/05/2020 00:48    Procedures Procedures   Medications Ordered in ED Medications  fentaNYL (  SUBLIMAZE) injection 100 mcg (100 mcg Intravenous Given 10/05/20 0026)  diazepam (VALIUM) injection 2.5 mg (2.5 mg  Intravenous Given 10/05/20 0107)  HYDROmorphone (DILAUDID) injection 1 mg (1 mg Intravenous Given 10/05/20 0148)  methocarbamol (ROBAXIN) tablet 500 mg (500 mg Oral Given 10/05/20 0150)    ED Course  I have reviewed the triage vital signs and the nursing notes.  Pertinent labs & imaging results that were available during my care of the patient were reviewed by me and considered in my medical decision making (see chart for details).    MDM Rules/Calculators/A&P                           Patient presents with intense leg pain and cramping that woke her up.  Due to severe pain and cramping, her husband called 911.  She reports feeling very anxious. Initial labs and imaging are negative.  We will focus on pain control. It appears that her knee is healing well and she denies any focal knee pain.  There is no calf tenderness or edema to suggest DVT.  Distal pulses are intact in the right leg.  She is able move all toes and flex and extend her right foot without difficulty. 2:49 AM Pt now resting comfortably Will continue to monitor 4:56 AM Patient slept for several hours and now feels improved.  Repeat exam reveals a well-perfused right leg without any focal tenderness.  No significant edema is noted.  No signs of cellulitis.  Low suspicion for acute DVT.  Suspect all this occurred because patient went off of her pain medications. Patient requesting discharge home Final Clinical Impression(s) / ED Diagnoses Final diagnoses:  Muscle cramps    Rx / DC Orders ED Discharge Orders     None        Ripley Fraise, MD 10/05/20 [phone]

## 2020-10-07 ENCOUNTER — Telehealth: Payer: Self-pay

## 2020-10-07 DIAGNOSIS — I10 Essential (primary) hypertension: Secondary | ICD-10-CM | POA: Diagnosis not present

## 2020-10-07 DIAGNOSIS — Z7984 Long term (current) use of oral hypoglycemic drugs: Secondary | ICD-10-CM | POA: Diagnosis not present

## 2020-10-07 DIAGNOSIS — R7303 Prediabetes: Secondary | ICD-10-CM | POA: Diagnosis not present

## 2020-10-07 DIAGNOSIS — Z7901 Long term (current) use of anticoagulants: Secondary | ICD-10-CM | POA: Diagnosis not present

## 2020-10-07 DIAGNOSIS — Z471 Aftercare following joint replacement surgery: Secondary | ICD-10-CM | POA: Diagnosis not present

## 2020-10-07 DIAGNOSIS — Z9181 History of falling: Secondary | ICD-10-CM | POA: Diagnosis not present

## 2020-10-07 NOTE — Telephone Encounter (Signed)
Noted nothing is needed at this time.

## 2020-10-07 NOTE — Telephone Encounter (Signed)
Correct noted

## 2020-10-07 NOTE — Telephone Encounter (Signed)
[place] called to confirm pt Dx of Hypertension and Diabetes, representative was informed of dx from problem list. Call back # [phone] [facility]

## 2020-10-08 DIAGNOSIS — G2581 Restless legs syndrome: Secondary | ICD-10-CM | POA: Diagnosis not present

## 2020-10-08 DIAGNOSIS — R293 Abnormal posture: Secondary | ICD-10-CM | POA: Diagnosis not present

## 2020-10-08 DIAGNOSIS — Z7984 Long term (current) use of oral hypoglycemic drugs: Secondary | ICD-10-CM | POA: Diagnosis not present

## 2020-10-08 DIAGNOSIS — Z96651 Presence of right artificial knee joint: Secondary | ICD-10-CM | POA: Diagnosis not present

## 2020-10-08 DIAGNOSIS — M1990 Unspecified osteoarthritis, unspecified site: Secondary | ICD-10-CM | POA: Diagnosis not present

## 2020-10-08 DIAGNOSIS — Z741 Need for assistance with personal care: Secondary | ICD-10-CM | POA: Diagnosis not present

## 2020-10-08 DIAGNOSIS — R2681 Unsteadiness on feet: Secondary | ICD-10-CM | POA: Diagnosis not present

## 2020-10-08 DIAGNOSIS — K5790 Diverticulosis of intestine, part unspecified, without perforation or abscess without bleeding: Secondary | ICD-10-CM | POA: Diagnosis not present

## 2020-10-08 DIAGNOSIS — Z6833 Body mass index (BMI) 33.0-33.9, adult: Secondary | ICD-10-CM | POA: Diagnosis not present

## 2020-10-08 DIAGNOSIS — I10 Essential (primary) hypertension: Secondary | ICD-10-CM | POA: Diagnosis not present

## 2020-10-08 DIAGNOSIS — M8580 Other specified disorders of bone density and structure, unspecified site: Secondary | ICD-10-CM | POA: Diagnosis not present

## 2020-10-08 DIAGNOSIS — E039 Hypothyroidism, unspecified: Secondary | ICD-10-CM | POA: Diagnosis not present

## 2020-10-08 DIAGNOSIS — M25561 Pain in right knee: Secondary | ICD-10-CM | POA: Diagnosis not present

## 2020-10-08 DIAGNOSIS — J45909 Unspecified asthma, uncomplicated: Secondary | ICD-10-CM | POA: Diagnosis not present

## 2020-10-08 DIAGNOSIS — Z9181 History of falling: Secondary | ICD-10-CM | POA: Diagnosis not present

## 2020-10-08 DIAGNOSIS — R41841 Cognitive communication deficit: Secondary | ICD-10-CM | POA: Diagnosis not present

## 2020-10-08 DIAGNOSIS — K588 Other irritable bowel syndrome: Secondary | ICD-10-CM | POA: Diagnosis not present

## 2020-10-08 DIAGNOSIS — M6281 Muscle weakness (generalized): Secondary | ICD-10-CM | POA: Diagnosis not present

## 2020-10-08 DIAGNOSIS — R278 Other lack of coordination: Secondary | ICD-10-CM | POA: Diagnosis not present

## 2020-10-08 DIAGNOSIS — E1140 Type 2 diabetes mellitus with diabetic neuropathy, unspecified: Secondary | ICD-10-CM | POA: Diagnosis not present

## 2020-10-08 DIAGNOSIS — E669 Obesity, unspecified: Secondary | ICD-10-CM | POA: Diagnosis not present

## 2020-10-08 DIAGNOSIS — T8484XD Pain due to internal orthopedic prosthetic devices, implants and grafts, subsequent encounter: Secondary | ICD-10-CM | POA: Diagnosis not present

## 2020-10-08 DIAGNOSIS — K5900 Constipation, unspecified: Secondary | ICD-10-CM | POA: Diagnosis not present

## 2020-10-08 DIAGNOSIS — Z96659 Presence of unspecified artificial knee joint: Secondary | ICD-10-CM | POA: Diagnosis not present

## 2020-10-08 DIAGNOSIS — E785 Hyperlipidemia, unspecified: Secondary | ICD-10-CM | POA: Diagnosis not present

## 2020-10-08 DIAGNOSIS — Z471 Aftercare following joint replacement surgery: Secondary | ICD-10-CM | POA: Diagnosis not present

## 2020-10-08 DIAGNOSIS — E119 Type 2 diabetes mellitus without complications: Secondary | ICD-10-CM | POA: Diagnosis not present

## 2020-10-08 DIAGNOSIS — M1711 Unilateral primary osteoarthritis, right knee: Secondary | ICD-10-CM | POA: Diagnosis not present

## 2020-10-08 DIAGNOSIS — Z79891 Long term (current) use of opiate analgesic: Secondary | ICD-10-CM | POA: Diagnosis not present

## 2020-10-08 DIAGNOSIS — R2689 Other abnormalities of gait and mobility: Secondary | ICD-10-CM | POA: Diagnosis not present

## 2020-10-08 DIAGNOSIS — K219 Gastro-esophageal reflux disease without esophagitis: Secondary | ICD-10-CM | POA: Diagnosis not present

## 2020-10-09 DIAGNOSIS — E785 Hyperlipidemia, unspecified: Secondary | ICD-10-CM | POA: Diagnosis not present

## 2020-10-09 DIAGNOSIS — K5900 Constipation, unspecified: Secondary | ICD-10-CM | POA: Diagnosis not present

## 2020-10-09 DIAGNOSIS — G2581 Restless legs syndrome: Secondary | ICD-10-CM | POA: Diagnosis not present

## 2020-10-09 DIAGNOSIS — I10 Essential (primary) hypertension: Secondary | ICD-10-CM | POA: Diagnosis not present

## 2020-10-09 DIAGNOSIS — K588 Other irritable bowel syndrome: Secondary | ICD-10-CM | POA: Diagnosis not present

## 2020-10-09 DIAGNOSIS — J45909 Unspecified asthma, uncomplicated: Secondary | ICD-10-CM | POA: Diagnosis not present

## 2020-10-09 DIAGNOSIS — E039 Hypothyroidism, unspecified: Secondary | ICD-10-CM | POA: Diagnosis not present

## 2020-10-09 DIAGNOSIS — T8484XD Pain due to internal orthopedic prosthetic devices, implants and grafts, subsequent encounter: Secondary | ICD-10-CM | POA: Diagnosis not present

## 2020-10-09 DIAGNOSIS — M1990 Unspecified osteoarthritis, unspecified site: Secondary | ICD-10-CM | POA: Diagnosis not present

## 2020-10-09 DIAGNOSIS — E119 Type 2 diabetes mellitus without complications: Secondary | ICD-10-CM | POA: Diagnosis not present

## 2020-10-09 DIAGNOSIS — K219 Gastro-esophageal reflux disease without esophagitis: Secondary | ICD-10-CM | POA: Diagnosis not present

## 2020-10-10 DIAGNOSIS — T8484XD Pain due to internal orthopedic prosthetic devices, implants and grafts, subsequent encounter: Secondary | ICD-10-CM | POA: Diagnosis not present

## 2020-10-10 DIAGNOSIS — I10 Essential (primary) hypertension: Secondary | ICD-10-CM | POA: Diagnosis not present

## 2020-10-10 DIAGNOSIS — E119 Type 2 diabetes mellitus without complications: Secondary | ICD-10-CM | POA: Diagnosis not present

## 2020-10-10 DIAGNOSIS — E039 Hypothyroidism, unspecified: Secondary | ICD-10-CM | POA: Diagnosis not present

## 2020-10-11 DIAGNOSIS — Z471 Aftercare following joint replacement surgery: Secondary | ICD-10-CM | POA: Diagnosis not present

## 2020-10-11 DIAGNOSIS — M1711 Unilateral primary osteoarthritis, right knee: Secondary | ICD-10-CM | POA: Diagnosis not present

## 2020-10-11 DIAGNOSIS — Z96651 Presence of right artificial knee joint: Secondary | ICD-10-CM | POA: Diagnosis not present

## 2020-10-15 DIAGNOSIS — M1990 Unspecified osteoarthritis, unspecified site: Secondary | ICD-10-CM | POA: Diagnosis not present

## 2020-10-15 DIAGNOSIS — E1140 Type 2 diabetes mellitus with diabetic neuropathy, unspecified: Secondary | ICD-10-CM | POA: Diagnosis not present

## 2020-10-15 DIAGNOSIS — Z96659 Presence of unspecified artificial knee joint: Secondary | ICD-10-CM | POA: Diagnosis not present

## 2020-10-15 DIAGNOSIS — G2581 Restless legs syndrome: Secondary | ICD-10-CM | POA: Diagnosis not present

## 2020-10-16 DIAGNOSIS — I10 Essential (primary) hypertension: Secondary | ICD-10-CM | POA: Diagnosis not present

## 2020-10-16 DIAGNOSIS — M1990 Unspecified osteoarthritis, unspecified site: Secondary | ICD-10-CM | POA: Diagnosis not present

## 2020-10-16 DIAGNOSIS — G2581 Restless legs syndrome: Secondary | ICD-10-CM | POA: Diagnosis not present

## 2020-10-17 DIAGNOSIS — M1990 Unspecified osteoarthritis, unspecified site: Secondary | ICD-10-CM | POA: Diagnosis not present

## 2020-10-17 DIAGNOSIS — I10 Essential (primary) hypertension: Secondary | ICD-10-CM | POA: Diagnosis not present

## 2020-10-17 DIAGNOSIS — G2581 Restless legs syndrome: Secondary | ICD-10-CM | POA: Diagnosis not present

## 2020-10-30 ENCOUNTER — Other Ambulatory Visit: Payer: Self-pay | Admitting: *Deleted

## 2020-10-30 NOTE — Patient Outreach (Signed)
Member screened for potential [facility] Care Management needs. Per [facility] (Patient [name]) [name] resides in [facility].  Confidential voicemail message left for [facility] social worker to request return call.   Will continue to follow for potential [facility] Care Management services while member resides in SNF.   Helen Rolling, MSN, RN,BSN [place] Acute Care Coordinator [phone] [facility]) [phone]  (Toll free office)

## 2020-11-01 DIAGNOSIS — T8484XD Pain due to internal orthopedic prosthetic devices, implants and grafts, subsequent encounter: Secondary | ICD-10-CM | POA: Diagnosis not present

## 2020-11-01 DIAGNOSIS — G2581 Restless legs syndrome: Secondary | ICD-10-CM | POA: Diagnosis not present

## 2020-11-01 DIAGNOSIS — E785 Hyperlipidemia, unspecified: Secondary | ICD-10-CM | POA: Diagnosis not present

## 2020-11-01 DIAGNOSIS — Z79891 Long term (current) use of opiate analgesic: Secondary | ICD-10-CM | POA: Diagnosis not present

## 2020-11-01 DIAGNOSIS — E119 Type 2 diabetes mellitus without complications: Secondary | ICD-10-CM | POA: Diagnosis not present

## 2020-11-01 DIAGNOSIS — Z7984 Long term (current) use of oral hypoglycemic drugs: Secondary | ICD-10-CM | POA: Diagnosis not present

## 2020-11-01 DIAGNOSIS — M1990 Unspecified osteoarthritis, unspecified site: Secondary | ICD-10-CM | POA: Diagnosis not present

## 2020-11-01 DIAGNOSIS — E039 Hypothyroidism, unspecified: Secondary | ICD-10-CM | POA: Diagnosis not present

## 2020-11-01 DIAGNOSIS — K219 Gastro-esophageal reflux disease without esophagitis: Secondary | ICD-10-CM | POA: Diagnosis not present

## 2020-11-01 DIAGNOSIS — K5790 Diverticulosis of intestine, part unspecified, without perforation or abscess without bleeding: Secondary | ICD-10-CM | POA: Diagnosis not present

## 2020-11-01 DIAGNOSIS — Z471 Aftercare following joint replacement surgery: Secondary | ICD-10-CM | POA: Diagnosis not present

## 2020-11-01 DIAGNOSIS — Z9181 History of falling: Secondary | ICD-10-CM | POA: Diagnosis not present

## 2020-11-01 DIAGNOSIS — I10 Essential (primary) hypertension: Secondary | ICD-10-CM | POA: Diagnosis not present

## 2020-11-01 DIAGNOSIS — J45909 Unspecified asthma, uncomplicated: Secondary | ICD-10-CM | POA: Diagnosis not present

## 2020-11-01 DIAGNOSIS — M8580 Other specified disorders of bone density and structure, unspecified site: Secondary | ICD-10-CM | POA: Diagnosis not present

## 2020-11-01 DIAGNOSIS — E669 Obesity, unspecified: Secondary | ICD-10-CM | POA: Diagnosis not present

## 2020-11-01 DIAGNOSIS — Z96651 Presence of right artificial knee joint: Secondary | ICD-10-CM | POA: Diagnosis not present

## 2020-11-01 DIAGNOSIS — Z6833 Body mass index (BMI) 33.0-33.9, adult: Secondary | ICD-10-CM | POA: Diagnosis not present

## 2020-11-05 DIAGNOSIS — E119 Type 2 diabetes mellitus without complications: Secondary | ICD-10-CM | POA: Diagnosis not present

## 2020-11-05 DIAGNOSIS — E039 Hypothyroidism, unspecified: Secondary | ICD-10-CM | POA: Diagnosis not present

## 2020-11-05 DIAGNOSIS — Z471 Aftercare following joint replacement surgery: Secondary | ICD-10-CM | POA: Diagnosis not present

## 2020-11-05 DIAGNOSIS — G2581 Restless legs syndrome: Secondary | ICD-10-CM | POA: Diagnosis not present

## 2020-11-05 DIAGNOSIS — I10 Essential (primary) hypertension: Secondary | ICD-10-CM | POA: Diagnosis not present

## 2020-11-05 DIAGNOSIS — E785 Hyperlipidemia, unspecified: Secondary | ICD-10-CM | POA: Diagnosis not present

## 2020-11-06 DIAGNOSIS — G2581 Restless legs syndrome: Secondary | ICD-10-CM | POA: Diagnosis not present

## 2020-11-06 DIAGNOSIS — E119 Type 2 diabetes mellitus without complications: Secondary | ICD-10-CM | POA: Diagnosis not present

## 2020-11-06 DIAGNOSIS — Z471 Aftercare following joint replacement surgery: Secondary | ICD-10-CM | POA: Diagnosis not present

## 2020-11-06 DIAGNOSIS — E785 Hyperlipidemia, unspecified: Secondary | ICD-10-CM | POA: Diagnosis not present

## 2020-11-06 DIAGNOSIS — E039 Hypothyroidism, unspecified: Secondary | ICD-10-CM | POA: Diagnosis not present

## 2020-11-06 DIAGNOSIS — I10 Essential (primary) hypertension: Secondary | ICD-10-CM | POA: Diagnosis not present

## 2020-11-07 ENCOUNTER — Other Ambulatory Visit: Payer: Self-pay | Admitting: *Deleted

## 2020-11-07 NOTE — Patient Outreach (Signed)
[facility]- Acute Care Coordinator follow up. Member screened for potential [facility] care coordination needs.  Verified in [place] that member transitioned home on 11/01/20 from [facility].   Telephone call made to Mrs. Lasure [phone] to assess for potential [facility] care coordination needs. Mrs. Dunkley states she is doing well. Endorses she has Rachel. Reports PCP appointment is scheduled for Monday, 11/11/20. Denies having any care coordination or care management needs.    Marthenia Rolling, MSN, RN,BSN [place] Acute Care Coordinator [phone] [facility]) [phone]  (Toll free office)

## 2020-11-08 DIAGNOSIS — E785 Hyperlipidemia, unspecified: Secondary | ICD-10-CM | POA: Diagnosis not present

## 2020-11-08 DIAGNOSIS — G2581 Restless legs syndrome: Secondary | ICD-10-CM | POA: Diagnosis not present

## 2020-11-08 DIAGNOSIS — E119 Type 2 diabetes mellitus without complications: Secondary | ICD-10-CM | POA: Diagnosis not present

## 2020-11-08 DIAGNOSIS — I10 Essential (primary) hypertension: Secondary | ICD-10-CM | POA: Diagnosis not present

## 2020-11-08 DIAGNOSIS — Z471 Aftercare following joint replacement surgery: Secondary | ICD-10-CM | POA: Diagnosis not present

## 2020-11-08 DIAGNOSIS — E039 Hypothyroidism, unspecified: Secondary | ICD-10-CM | POA: Diagnosis not present

## 2020-11-09 DIAGNOSIS — Z96651 Presence of right artificial knee joint: Secondary | ICD-10-CM | POA: Diagnosis not present

## 2020-11-11 ENCOUNTER — Ambulatory Visit (INDEPENDENT_AMBULATORY_CARE_PROVIDER_SITE_OTHER): Payer: Medicare Other | Admitting: Internal Medicine

## 2020-11-11 ENCOUNTER — Encounter: Payer: Self-pay | Admitting: Internal Medicine

## 2020-11-11 ENCOUNTER — Other Ambulatory Visit: Payer: Self-pay

## 2020-11-11 VITALS — BP 166/80 | HR 55 | Temp 98.5°F | Ht 59.0 in | Wt 166.8 lb

## 2020-11-11 DIAGNOSIS — E1165 Type 2 diabetes mellitus with hyperglycemia: Secondary | ICD-10-CM | POA: Diagnosis not present

## 2020-11-11 DIAGNOSIS — T887XXA Unspecified adverse effect of drug or medicament, initial encounter: Secondary | ICD-10-CM | POA: Diagnosis not present

## 2020-11-11 DIAGNOSIS — M1711 Unilateral primary osteoarthritis, right knee: Secondary | ICD-10-CM | POA: Diagnosis not present

## 2020-11-11 DIAGNOSIS — I10 Essential (primary) hypertension: Secondary | ICD-10-CM | POA: Diagnosis not present

## 2020-11-11 MED ORDER — EMPAGLIFLOZIN 10 MG PO TABS: 10.0000 mg | ORAL_TABLET | Freq: Every day | ORAL | 3 refills | Status: DC

## 2020-11-11 MED ORDER — FREESTYLE LIBRE READER DEVI: 99 refills | Status: DC

## 2020-11-11 MED ORDER — FREESTYLE LIBRE 14 DAY SENSOR MISC: 5 refills | Status: DC

## 2020-11-11 NOTE — Patient Instructions (Signed)
You rlast a1c was 7.0 Stop the metformin.  Try beginning the jardiance .  I sent  in glucose monitor  to your pharmacy .  Continue to monitor  BP to make  sure in range.   Plan follow up . 2 months  .

## 2020-11-11 NOTE — Progress Notes (Signed)
Chief Complaint  Patient presents with   Follow-up    HPI:  [name] 70 y.o. come in for   had  tka dr Case   DJD Septtember  last check October 5 and to fu in 2 weeks  Sed rate is slightly elevated, but she does have a normal CRP and normal white blood cell count. I would favor that this would be not infectious at the time as the CRP P is more likely to come down quickly after surgery and the sed rate may remain slightly elevated for a longer duration of time. She will follow-up in 2 weeks as previously scheduled.  Electronically signed by: Martinique Miller Case, MD 10/30/20 1110   Bp  120 range  at home   Got home  from 5 weeks  and doing better   r/o infectin and fu  lab and  inflammatory markers   Se of metformin  diarrhea with once a day metfromin 500 er  asks for other options   ROS: See pertinent positives and negatives per HPI.  Past Medical History:  Diagnosis Date   Acute pyelonephritis 05/02/2012   Asthma    Diverticulitis 03/24/2017   see report [place] [facility]   Dyspnea    nl PFTs, and echo   GERD (gastroesophageal reflux disease)    Headache(784.0)    Heart murmur    History of kidney stones    IBS (irritable bowel syndrome)    Nephrolithiasis    Pre-diabetes 12/15   Recurrent oral ulcers    Thyroid goiter    I 131 rx and then  total throidectomy 2005 [facility]    Family History  Problem Relation Age of Onset   COPD Sister    Coronary artery disease Sister        age 55 also copd   Cancer Mother        bladder   Arthritis Other    Cancer Other        breast   Diabetes Other    Hyperlipidemia Other    Hypertension Other    Stroke Other    Heart disease Other    Coronary artery disease Father        also had AAA   Aneurysm Father        Aortic and thoracic fatal    Social History   Socioeconomic History   Marital status: Married    Spouse name: Not on file   Number of children: Not on file   Years of education: Not on file    Highest education level: Not on file  Occupational History   Not on file  Tobacco Use   Smoking status: Never   Smokeless tobacco: Never  Vaping Use   Vaping Use: Never used  Substance and Sexual Activity   Alcohol use: Yes    Comment: seldom   Drug use: No   Sexual activity: Not on file  Other Topics Concern   Not on file  Social History Narrative   Married   Has grandchildren   Never smoked    G4P4   hh of 2  care of GC ages 67 - 43 month  5 days a week   Social Determinants of Radio broadcast assistant Strain: Low Risk    Difficulty of Paying Living Expenses: Not hard at all  Food Insecurity: No Food Insecurity   Worried About Charity fundraiser in the Last Year: Never true   Ran Out  of Food in the Last Year: Never true  Transportation Needs: No Transportation Needs   Lack of Transportation (Medical): No   Lack of Transportation (Non-Medical): No  Physical Activity: Sufficiently Active   Days of Exercise per Week: 7 days   Minutes of Exercise per Session: 60 min  Stress: No Stress Concern Present   Feeling of Stress : Not at all  Social Connections: Moderately Integrated   Frequency of Communication with Friends and Family: More than three times a week   Frequency of Social Gatherings with Friends and Family: More than three times a week   Attends Religious Services: More than 4 times per year   Active Member of Clubs or Organizations: No   Attends Archivist Meetings: Never   Marital Status: Married    Outpatient Medications Prior to Visit  Medication Sig Dispense Refill   acetaminophen (TYLENOL) 500 MG tablet Take 1,000 mg by mouth 3 (three) times daily.     bisacodyl (DULCOLAX) 5 MG EC tablet Take 1 tablet (5 mg total) by mouth daily as needed for moderate constipation. 30 tablet 0   Blood Glucose Monitoring Suppl (FREESTYLE LITE) DEVI Use twice daily for glucose control 1 each 0   docusate sodium (COLACE) 100 MG capsule Take 1 capsule (100 mg  total) by mouth daily as needed for mild constipation. 10 capsule 0   glucose blood (FREESTYLE LITE) test strip 1 each by Other route 2 (two) times daily. 100 each 12   methocarbamol (ROBAXIN) 500 MG tablet Take 1-2 tablets (500-1,000 mg total) by mouth every 8 (eight) hours as needed for muscle spasms. 60 tablet 0   olmesartan (BENICAR) 20 MG tablet TAKE ONE TABLET BY MOUTH DAILY 90 tablet 0   pantoprazole (PROTONIX) 40 MG tablet TAKE ONE TABLET BY MOUTH TWICE A DAY 180 tablet 0   pregabalin (LYRICA) 50 MG capsule Take 50 mg by mouth 2 (two) times daily.     rOPINIRole (REQUIP) 0.5 MG tablet TAKE ONE TO TWO TABLETS BY MOUTH DAILY AT NOON AND 5PM, AND THEN TAKE THREE TABLETS BY MOUTH EVERY NIGHT AT BEDTIME 540 tablet 0   rosuvastatin (CRESTOR) 10 MG tablet TAKE ONE TABLET BY MOUTH DAILY 90 tablet 0   SYNTHROID 112 MCG tablet TAKE ONE TABLET BY MOUTH EVERY MORNING BEFORE BREAKFAST 90 tablet 0   tiZANidine (ZANAFLEX) 4 MG capsule Take 4 mg by mouth 3 (three) times daily.     metFORMIN (GLUCOPHAGE-XR) 500 MG 24 hr tablet Take 1 tablet (500 mg total) by mouth daily with breakfast. 90 tablet 1   No facility-administered medications prior to visit.     EXAM:  BP (!) 166/80 (BP Location: Left Arm, Patient Position: Sitting, Cuff Size: Normal)   Pulse (!) 55   Temp 98.5 F (36.9 C) (Oral)   Ht 4\' 11"  (1.499 m)   Wt 166 lb 12.8 oz (75.7 kg)   SpO2 98%   BMI 33.69 kg/m   Body mass index is 33.69 kg/m. Repeat blood pressure 158/78 right arm sitting. GENERAL: vitals reviewed and listed above, alert, oriented, appears well hydrated and in no acute distress walks with a cane   independent otherwise  HEENT: atraumatic, conjunctiva  clear, no obvious abnormalities on inspection of external nose and ears OP : Masked NECK: no obvious masses on inspection palpation  LUNGS: clear to auscultation bilaterally, no wheezes, rales or rhonchi, good air movement CV: HRRR, no clubbing cyanosis or  peripheral  edema nl cap refill  MS: moves all extremitie right knee with healing scar some effusion pink but no redness or streaking or asymmetrical edema of distal.  Walking with cane independent PSYCH: pleasant and cooperative, no obvious depression or anxiety Lab Results  Component Value Date   WBC 8.3 10/05/2020   HGB 12.1 10/05/2020   HCT 35.7 (L) 10/05/2020   PLT 300 10/05/2020   GLUCOSE 117 (H) 10/05/2020   CHOL 138 06/20/2019   TRIG 109.0 06/20/2019   HDL 51.40 06/20/2019   LDLDIRECT 142.0 01/28/2016   LDLCALC 65 06/20/2019   ALT 13 10/05/2020   AST 14 (L) 10/05/2020   NA 139 10/05/2020   K 3.7 10/05/2020   CL 100 10/05/2020   CREATININE 0.87 10/05/2020   BUN 19 10/05/2020   CO2 27 10/05/2020   TSH 1.96 06/20/2019   INR 1.12 03/17/2017   HGBA1C 6.9 (A) 01/30/2020   BP Readings from Last 3 Encounters:  11/11/20 (!) 166/80  10/05/20 128/72  09/16/20 124/70  Jg a1c was 7  in August. Care everywhere   ASSESSMENT AND PLAN:  Discussed the following assessment and plan:  Type 2 diabetes mellitus with hyperglycemia, without long-term current use of insulin (HCC)  Essential hypertension  Arthritis of right knee  Medication side effect - diarrhea  from metformin.  Side effects with 500 mg extended release metformin not tolerated for her.  Last A1c August 7 0.0 Discussed options we will try SGLT2 medicine Jardiance 10 mg a day risk-benefit discussed. Sent in freestyle libre 14-day continuous monitor she is having some difficulties having to do so many CBGs.  Plan 58-month follow-up or thereabouts earlier if not tolerating medicine or problems with above plan. Make sure blood pressure at home is at goal as she is reassured meatus. She will be following up with orthopedics in regard to her knee no acute signs of infection although did have some increased swelling may be after excessive activity sed rate and CRPs were normal. Is declining flu vaccine.  -Patient advised to return or  notify health care team  if  new concerns arise.  Patient Instructions  You rlast a1c was 7.0 Stop the metformin.  Try beginning the jardiance .  I sent  in glucose monitor  to your pharmacy .  Continue to monitor  BP to make  sure in range.   Plan follow up . 2 months  .    Standley Brooking. Hiran Leard M.D.

## 2020-11-12 ENCOUNTER — Other Ambulatory Visit: Payer: Self-pay | Admitting: Internal Medicine

## 2020-11-12 ENCOUNTER — Telehealth: Payer: Self-pay | Admitting: Internal Medicine

## 2020-11-12 DIAGNOSIS — E119 Type 2 diabetes mellitus without complications: Secondary | ICD-10-CM | POA: Diagnosis not present

## 2020-11-12 DIAGNOSIS — G2581 Restless legs syndrome: Secondary | ICD-10-CM | POA: Diagnosis not present

## 2020-11-12 DIAGNOSIS — E785 Hyperlipidemia, unspecified: Secondary | ICD-10-CM | POA: Diagnosis not present

## 2020-11-12 DIAGNOSIS — Z471 Aftercare following joint replacement surgery: Secondary | ICD-10-CM | POA: Diagnosis not present

## 2020-11-12 DIAGNOSIS — I10 Essential (primary) hypertension: Secondary | ICD-10-CM | POA: Diagnosis not present

## 2020-11-12 DIAGNOSIS — E039 Hypothyroidism, unspecified: Secondary | ICD-10-CM | POA: Diagnosis not present

## 2020-11-12 MED ORDER — FREESTYLE LIBRE READER DEVI: 99 refills | Status: DC

## 2020-11-12 MED ORDER — FREESTYLE LIBRE 14 DAY SENSOR MISC: 5 refills | Status: DC

## 2020-11-12 NOTE — Telephone Encounter (Signed)
Left a message for the pt to return my call to confirm pharmacy she would like rx sent to

## 2020-11-12 NOTE — Telephone Encounter (Signed)
Rx sent to pt's pharmacy

## 2020-11-12 NOTE — Addendum Note (Signed)
Addended by: Nilda Riggs on: 11/12/2020 03:13 PM   Modules accepted: Orders

## 2020-11-12 NOTE — Telephone Encounter (Signed)
Pt is calling and she can not afford the jardiance cost is 400.00. Pt would like samples or see about getting coupon. Pt also states [facility] does not carry freestyle device nor freestyle meter and she does not know who does.  I ask the pt to call her drug plan to see where she can get freestyle etc  she said dr Regis Bill knows please advise

## 2020-11-13 DIAGNOSIS — Z789 Other specified health status: Secondary | ICD-10-CM | POA: Diagnosis not present

## 2020-11-13 DIAGNOSIS — M1711 Unilateral primary osteoarthritis, right knee: Secondary | ICD-10-CM | POA: Diagnosis not present

## 2020-11-13 DIAGNOSIS — R29898 Other symptoms and signs involving the musculoskeletal system: Secondary | ICD-10-CM | POA: Diagnosis not present

## 2020-11-13 DIAGNOSIS — M25661 Stiffness of right knee, not elsewhere classified: Secondary | ICD-10-CM | POA: Diagnosis not present

## 2020-11-13 DIAGNOSIS — Z7409 Other reduced mobility: Secondary | ICD-10-CM | POA: Diagnosis not present

## 2020-11-13 DIAGNOSIS — M25561 Pain in right knee: Secondary | ICD-10-CM | POA: Diagnosis not present

## 2020-11-15 DIAGNOSIS — M25661 Stiffness of right knee, not elsewhere classified: Secondary | ICD-10-CM | POA: Diagnosis not present

## 2020-11-15 DIAGNOSIS — Z789 Other specified health status: Secondary | ICD-10-CM | POA: Diagnosis not present

## 2020-11-15 DIAGNOSIS — M25561 Pain in right knee: Secondary | ICD-10-CM | POA: Diagnosis not present

## 2020-11-15 DIAGNOSIS — R29898 Other symptoms and signs involving the musculoskeletal system: Secondary | ICD-10-CM | POA: Diagnosis not present

## 2020-11-15 DIAGNOSIS — M1711 Unilateral primary osteoarthritis, right knee: Secondary | ICD-10-CM | POA: Diagnosis not present

## 2020-11-15 DIAGNOSIS — Z7409 Other reduced mobility: Secondary | ICD-10-CM | POA: Diagnosis not present

## 2020-11-15 NOTE — Telephone Encounter (Signed)
Called patient to follow up on cost of Jardiance. Patient was unable to talk at this moment but will call her back later today.

## 2020-11-15 NOTE — Telephone Encounter (Signed)
Called patient back and determined income elligibility for patient assistance. Patient will not qualify for Jardiance PAP but will for [place]. Applied for Farxiga PAP over the telephone and patient was approved. Will request a prescription to be sent into the pharmacy and apply a 1 time use coupon card and to fax to [facility] for future refills.  Patient is aware of the plan. Will follow up next week.

## 2020-11-18 DIAGNOSIS — Z789 Other specified health status: Secondary | ICD-10-CM | POA: Diagnosis not present

## 2020-11-18 DIAGNOSIS — Z7409 Other reduced mobility: Secondary | ICD-10-CM | POA: Diagnosis not present

## 2020-11-18 DIAGNOSIS — M25661 Stiffness of right knee, not elsewhere classified: Secondary | ICD-10-CM | POA: Diagnosis not present

## 2020-11-18 DIAGNOSIS — M25561 Pain in right knee: Secondary | ICD-10-CM | POA: Diagnosis not present

## 2020-11-18 DIAGNOSIS — M1711 Unilateral primary osteoarthritis, right knee: Secondary | ICD-10-CM | POA: Diagnosis not present

## 2020-11-18 DIAGNOSIS — R29898 Other symptoms and signs involving the musculoskeletal system: Secondary | ICD-10-CM | POA: Diagnosis not present

## 2020-11-18 MED ORDER — DAPAGLIFLOZIN PROPANEDIOL 5 MG PO TABS: 5.0000 mg | ORAL_TABLET | Freq: Every day | ORAL | 1 refills | Status: DC

## 2020-11-18 NOTE — Addendum Note (Signed)
Addended byBurnis Medin on: 11/18/2020 06:49 PM   Modules accepted: Orders

## 2020-11-18 NOTE — Addendum Note (Signed)
Addended by: Nilda Riggs on: 11/18/2020 09:33 AM   Modules accepted: Orders

## 2020-11-21 ENCOUNTER — Other Ambulatory Visit: Payer: Self-pay

## 2020-11-21 DIAGNOSIS — Z789 Other specified health status: Secondary | ICD-10-CM | POA: Diagnosis not present

## 2020-11-21 DIAGNOSIS — M25561 Pain in right knee: Secondary | ICD-10-CM | POA: Diagnosis not present

## 2020-11-21 DIAGNOSIS — R29898 Other symptoms and signs involving the musculoskeletal system: Secondary | ICD-10-CM | POA: Diagnosis not present

## 2020-11-21 DIAGNOSIS — M1711 Unilateral primary osteoarthritis, right knee: Secondary | ICD-10-CM | POA: Diagnosis not present

## 2020-11-21 DIAGNOSIS — M25661 Stiffness of right knee, not elsewhere classified: Secondary | ICD-10-CM | POA: Diagnosis not present

## 2020-11-21 DIAGNOSIS — Z7409 Other reduced mobility: Secondary | ICD-10-CM | POA: Diagnosis not present

## 2020-11-21 MED ORDER — DAPAGLIFLOZIN PROPANEDIOL 5 MG PO TABS
5.0000 mg | ORAL_TABLET | Freq: Every day | ORAL | 1 refills | Status: DC
Start: 2020-11-21 — End: 2020-11-22

## 2020-11-21 MED ORDER — DAPAGLIFLOZIN PROPANEDIOL 5 MG PO TABS
5.0000 mg | ORAL_TABLET | Freq: Every day | ORAL | 1 refills | Status: DC
Start: 2020-11-21 — End: 2020-11-21

## 2020-11-21 NOTE — Addendum Note (Signed)
Addended by: Nilda Riggs on: 11/21/2020 08:21 AM   Modules accepted: Orders

## 2020-11-22 ENCOUNTER — Other Ambulatory Visit: Payer: Self-pay

## 2020-11-22 MED ORDER — DAPAGLIFLOZIN PROPANEDIOL 5 MG PO TABS: 5.0000 mg | ORAL_TABLET | Freq: Every day | ORAL | 1 refills | Status: DC

## 2020-11-22 NOTE — Telephone Encounter (Signed)
Called patient back to make her aware that the new prescription for Wilder Glade is being processed at [facility]. Called [facility] to provide the coupon and they confirm a $0 copay for a 30 days supply.  Patient is aware that [facility] should be contacting her and if they do not in a few weeks prior to her running out of medication, she will call the office.

## 2020-11-25 ENCOUNTER — Other Ambulatory Visit: Payer: Self-pay | Admitting: Internal Medicine

## 2020-11-25 DIAGNOSIS — M25561 Pain in right knee: Secondary | ICD-10-CM | POA: Diagnosis not present

## 2020-11-25 DIAGNOSIS — R29898 Other symptoms and signs involving the musculoskeletal system: Secondary | ICD-10-CM | POA: Diagnosis not present

## 2020-11-25 DIAGNOSIS — Z7409 Other reduced mobility: Secondary | ICD-10-CM | POA: Diagnosis not present

## 2020-11-25 DIAGNOSIS — M1711 Unilateral primary osteoarthritis, right knee: Secondary | ICD-10-CM | POA: Diagnosis not present

## 2020-11-25 DIAGNOSIS — M25661 Stiffness of right knee, not elsewhere classified: Secondary | ICD-10-CM | POA: Diagnosis not present

## 2020-11-25 DIAGNOSIS — Z789 Other specified health status: Secondary | ICD-10-CM | POA: Diagnosis not present

## 2020-11-25 MED ORDER — DAPAGLIFLOZIN PROPANEDIOL 5 MG PO TABS: 5.0000 mg | ORAL_TABLET | Freq: Every day | ORAL | 1 refills | Status: DC

## 2020-11-26 NOTE — Telephone Encounter (Signed)
RX has been given to [facility]

## 2020-11-27 DIAGNOSIS — M25661 Stiffness of right knee, not elsewhere classified: Secondary | ICD-10-CM | POA: Diagnosis not present

## 2020-11-27 DIAGNOSIS — R29898 Other symptoms and signs involving the musculoskeletal system: Secondary | ICD-10-CM | POA: Diagnosis not present

## 2020-11-27 DIAGNOSIS — M1711 Unilateral primary osteoarthritis, right knee: Secondary | ICD-10-CM | POA: Diagnosis not present

## 2020-11-27 DIAGNOSIS — Z7409 Other reduced mobility: Secondary | ICD-10-CM | POA: Diagnosis not present

## 2020-11-27 DIAGNOSIS — M25561 Pain in right knee: Secondary | ICD-10-CM | POA: Diagnosis not present

## 2020-11-27 DIAGNOSIS — Z789 Other specified health status: Secondary | ICD-10-CM | POA: Diagnosis not present

## 2020-12-09 DIAGNOSIS — M25661 Stiffness of right knee, not elsewhere classified: Secondary | ICD-10-CM | POA: Diagnosis not present

## 2020-12-09 DIAGNOSIS — R29898 Other symptoms and signs involving the musculoskeletal system: Secondary | ICD-10-CM | POA: Diagnosis not present

## 2020-12-09 DIAGNOSIS — Z789 Other specified health status: Secondary | ICD-10-CM | POA: Diagnosis not present

## 2020-12-09 DIAGNOSIS — M1711 Unilateral primary osteoarthritis, right knee: Secondary | ICD-10-CM | POA: Diagnosis not present

## 2020-12-09 DIAGNOSIS — M25561 Pain in right knee: Secondary | ICD-10-CM | POA: Diagnosis not present

## 2020-12-09 DIAGNOSIS — Z7409 Other reduced mobility: Secondary | ICD-10-CM | POA: Diagnosis not present

## 2020-12-18 DIAGNOSIS — Z789 Other specified health status: Secondary | ICD-10-CM | POA: Diagnosis not present

## 2020-12-18 DIAGNOSIS — M1711 Unilateral primary osteoarthritis, right knee: Secondary | ICD-10-CM | POA: Diagnosis not present

## 2020-12-18 DIAGNOSIS — M25561 Pain in right knee: Secondary | ICD-10-CM | POA: Diagnosis not present

## 2020-12-18 DIAGNOSIS — M25661 Stiffness of right knee, not elsewhere classified: Secondary | ICD-10-CM | POA: Diagnosis not present

## 2020-12-18 DIAGNOSIS — R29898 Other symptoms and signs involving the musculoskeletal system: Secondary | ICD-10-CM | POA: Diagnosis not present

## 2020-12-18 DIAGNOSIS — Z7409 Other reduced mobility: Secondary | ICD-10-CM | POA: Diagnosis not present

## 2020-12-23 DIAGNOSIS — Z789 Other specified health status: Secondary | ICD-10-CM | POA: Diagnosis not present

## 2020-12-23 DIAGNOSIS — R29898 Other symptoms and signs involving the musculoskeletal system: Secondary | ICD-10-CM | POA: Diagnosis not present

## 2020-12-23 DIAGNOSIS — M1711 Unilateral primary osteoarthritis, right knee: Secondary | ICD-10-CM | POA: Diagnosis not present

## 2020-12-23 DIAGNOSIS — M25461 Effusion, right knee: Secondary | ICD-10-CM | POA: Diagnosis not present

## 2020-12-23 DIAGNOSIS — M25561 Pain in right knee: Secondary | ICD-10-CM | POA: Diagnosis not present

## 2020-12-23 DIAGNOSIS — M25661 Stiffness of right knee, not elsewhere classified: Secondary | ICD-10-CM | POA: Diagnosis not present

## 2020-12-23 DIAGNOSIS — Z7409 Other reduced mobility: Secondary | ICD-10-CM | POA: Diagnosis not present

## 2020-12-24 DIAGNOSIS — Z96651 Presence of right artificial knee joint: Secondary | ICD-10-CM | POA: Diagnosis not present

## 2020-12-24 DIAGNOSIS — T8482XA Fibrosis due to internal orthopedic prosthetic devices, implants and grafts, initial encounter: Secondary | ICD-10-CM | POA: Diagnosis not present

## 2020-12-25 DIAGNOSIS — M25561 Pain in right knee: Secondary | ICD-10-CM | POA: Diagnosis not present

## 2020-12-26 ENCOUNTER — Emergency Department (HOSPITAL_BASED_OUTPATIENT_CLINIC_OR_DEPARTMENT_OTHER): Payer: Medicare Other

## 2020-12-26 ENCOUNTER — Other Ambulatory Visit: Payer: Self-pay

## 2020-12-26 ENCOUNTER — Encounter (HOSPITAL_BASED_OUTPATIENT_CLINIC_OR_DEPARTMENT_OTHER): Payer: Self-pay | Admitting: Emergency Medicine

## 2020-12-26 ENCOUNTER — Emergency Department (HOSPITAL_BASED_OUTPATIENT_CLINIC_OR_DEPARTMENT_OTHER)
Admission: EM | Admit: 2020-12-26 | Discharge: 2020-12-26 | Disposition: A | Discharge status: Home / Self Care | Payer: Medicare Other | Attending: Emergency Medicine | Admitting: Emergency Medicine

## 2020-12-26 DIAGNOSIS — Z79899 Other long term (current) drug therapy: Secondary | ICD-10-CM | POA: Insufficient documentation

## 2020-12-26 DIAGNOSIS — E039 Hypothyroidism, unspecified: Secondary | ICD-10-CM | POA: Insufficient documentation

## 2020-12-26 DIAGNOSIS — R600 Localized edema: Secondary | ICD-10-CM | POA: Diagnosis not present

## 2020-12-26 DIAGNOSIS — Z7984 Long term (current) use of oral hypoglycemic drugs: Secondary | ICD-10-CM | POA: Diagnosis not present

## 2020-12-26 DIAGNOSIS — R252 Cramp and spasm: Secondary | ICD-10-CM | POA: Diagnosis not present

## 2020-12-26 DIAGNOSIS — I10 Essential (primary) hypertension: Secondary | ICD-10-CM | POA: Diagnosis not present

## 2020-12-26 DIAGNOSIS — J45909 Unspecified asthma, uncomplicated: Secondary | ICD-10-CM | POA: Diagnosis not present

## 2020-12-26 DIAGNOSIS — M79605 Pain in left leg: Secondary | ICD-10-CM | POA: Diagnosis not present

## 2020-12-26 LAB — BASIC METABOLIC PANEL
Anion gap: 8 (ref 5–15)
BUN: 17 mg/dL (ref 8–23)
CO2: 28 mmol/L (ref 22–32)
Calcium: 9.6 mg/dL (ref 8.9–10.3)
Chloride: 103 mmol/L (ref 98–111)
Creatinine, Ser: 0.98 mg/dL (ref 0.44–1.00)
GFR, Estimated: >60 mL/min (ref >60)
Glucose, Bld: 113 mg/dL — ABNORMAL HIGH (ref 70–99)
Potassium: 3.7 mmol/L (ref 3.5–5.1)
Sodium: 139 mmol/L (ref 135–145)

## 2020-12-26 LAB — MAGNESIUM: Magnesium: 1.8 mg/dL (ref 1.7–2.4)

## 2020-12-26 MED ORDER — METHOCARBAMOL 500 MG PO TABS
500.0000 mg | ORAL_TABLET | Freq: Once | ORAL | Status: AC
  Administered 2020-12-26: 500 mg via ORAL
  Filled 2020-12-26: qty 1

## 2020-12-26 NOTE — ED Notes (Signed)
Patient transported to Ultrasound 

## 2020-12-26 NOTE — ED Triage Notes (Signed)
Pt arrives to ED with c/o left leg pain. This pain started today. She reports the pain as "cramping." She reports that her left leg is swollen.

## 2020-12-26 NOTE — ED Notes (Signed)
Pt states left left cramping that started this morning, started behind left knee radiated to left foot, cramp not longer present but report soreness.

## 2020-12-26 NOTE — ED Notes (Signed)
ED Provider at bedside. 

## 2020-12-26 NOTE — ED Provider Notes (Signed)
[facility] EMERGENCY DEPT Provider Note   CSN: [id number] Arrival date & time: 12/26/20  1254     History Chief Complaint  Patient presents with   Leg Pain    [name] is a 70 y.o. female.   Leg Pain Associated symptoms: no back pain   Patient presents with cramping left leg pain.  Started this morning.  States she was driving to therapy and the lower leg cramped up severely.  States she had to hit on the steering wheel because it hurts so bad.  Went to therapy.  States she was told that this larger on the left compared to the right.  Did have recent manipulation of the right knee to get more range of motion back in it.  Was sedated for this.  Does have some chronic swelling the left leg also.  Previous knee replacement to left.  No chest pain or trouble breathing.  States her toes would either curl down or go up with the pain.    Past Medical History:  Diagnosis Date   Acute pyelonephritis 05/02/2012   Asthma    Diverticulitis 03/24/2017   see report [facility] [place] [facility]   Dyspnea    nl PFTs, and echo   GERD (gastroesophageal reflux disease)    Headache(784.0)    Heart murmur    History of kidney stones    IBS (irritable bowel syndrome)    Nephrolithiasis    Pre-diabetes 12/15   Recurrent oral ulcers    Thyroid goiter    I 131 rx and then  total throidectomy 2005 [facility]    Patient Active Problem List   Diagnosis Date Noted   Newly diagnosed diabetes ([place]) 02/13/2020   Diverticulitis s/p lap sigmoid colectomy 03/24/2017 03/16/2017   Essential hypertension 08/17/2014   Unspecified essential hypertension 08/01/2013   Mild concussion 07/18/2013   Head injury, unspecified 07/18/2013   Back strain 07/18/2013   Fall 07/18/2013   Stress 06/26/2013   Upper airway cough syndrome 06/15/2013   Murmur 02/28/2013   Chest discomfort 01/24/2013   Dyspnea 01/24/2013   Elevated blood pressure reading 01/24/2013   Stressful life event affecting family  01/24/2013   Decreased exercise tolerance 01/24/2013   Pre-diabetes 04/29/2012   Abdominal swelling, left lower quadrant 04/29/2012   Tingling in extremities 01/27/2012   Cough, persistent 04/18/2011   Sleep disturbance 11/23/2010   Family history of premature coronary heart disease 11/23/2010   Leg pain 11/17/2010   Osteopenia 04/25/2010   CATARACT, LEFT EYE 11/27/2009   Pure hypercholesterolemia 01/29/2009   OBESITY 01/12/2008   DYSPNEA 01/12/2008   ABDOMINAL ULTRASOUND, ABNORMAL 12/26/2007   HYPERGLYCEMIA 12/14/2007   LIVER FUNCTION TESTS, ABNORMAL 12/14/2007   NEPHROLITHIASIS, HX OF 11/06/2007   VITAMIN D DEFICIENCY 11/25/2006   Hypothyroidism 10/04/2006   HYPERLIPIDEMIA NEC/NOS 10/04/2006   RLS (restless legs syndrome) 10/04/2006   DISORDER, BONE/CARTILAGE NOS 10/04/2006   WEIGHT GAIN 10/04/2006   ELEVATION, TRANSAMINASE/LDH LEVELS 10/04/2006   ASTHMA 09/30/2006   GERD 09/30/2006    Past Surgical History:  Procedure Laterality Date   ABDOMINAL HYSTERECTOMY  1994   fibroid   APPENDECTOMY  1968   CYSTOSCOPY WITH STENT PLACEMENT Bilateral 03/24/2017   Procedure: BILATERAL URETERAL CATHETER  PLACEMENT;  Surgeon: Alexis Frock, MD;  Location: [facility];  Service: Urology;  Laterality: Bilateral;   CYSTOSCOPY/RETROGRADE/URETEROSCOPY  03/24/2017   Procedure: CYSTOSCOPY/RETROGRADE/URETEROSCOPY;  Surgeon: Alexis Frock, MD;  Location: [facility];  Service: Urology;;   LAPAROSCOPIC PARTIAL COLECTOMY Left 03/24/2017   Procedure: LAPAROSCOPIC  ASSISTED LEFT  SIGMOID COLECTOMY;  Surgeon: Alphonsa Overall, MD;  Location: [facility];  Service: General;  Laterality: Left;   orif left tivial plateau fracture     Dr. Collier Salina 2/08   plates and pins take out of left knee  06/2008   SIGMOIDOSCOPY  03/24/2017   Procedure: SIGMOIDOSCOPY;  Surgeon: Alphonsa Overall, MD;  Location: [facility];  Service: General;;   [place]  2005   for  large nodule 2006     OB History      Gravida  4   Para  4   Term      Preterm      AB      Living         SAB      IAB      Ectopic      Multiple      Live Births              Family History  Problem Relation Age of Onset   COPD Sister    Coronary artery disease Sister        age 75 also copd   Cancer Mother        bladder   Arthritis Other    Cancer Other        breast   Diabetes Other    Hyperlipidemia Other    Hypertension Other    Stroke Other    Heart disease Other    Coronary artery disease Father        also had AAA   Aneurysm Father        Aortic and thoracic fatal    Social History   Tobacco Use   Smoking status: Never   Smokeless tobacco: Never  Vaping Use   Vaping Use: Never used  Substance Use Topics   Alcohol use: Yes    Comment: seldom   Drug use: No    Home Medications Prior to Admission medications   Medication Sig Start Date End Date Taking? Authorizing Provider  acetaminophen (TYLENOL) 500 MG tablet Take 1,000 mg by mouth 3 (three) times daily.   Yes [provider]  bisacodyl (DULCOLAX) 5 MG EC tablet Take 1 tablet (5 mg total) by mouth daily as needed for moderate constipation. 03/30/17  Yes Hosie Poisson, MD  dapagliflozin propanediol (FARXIGA) 5 MG TABS tablet Take 1 tablet (5 mg total) by mouth daily before breakfast. 11/25/20  Yes Panosh, Standley Brooking, MD  docusate sodium (COLACE) 100 MG capsule Take 1 capsule (100 mg total) by mouth daily as needed for mild constipation. 03/30/17  Yes Hosie Poisson, MD  olmesartan (BENICAR) 20 MG tablet TAKE ONE TABLET BY MOUTH DAILY 09/20/20  Yes Panosh, Standley Brooking, MD  pantoprazole (PROTONIX) 40 MG tablet TAKE ONE TABLET BY MOUTH TWICE A DAY 04/15/20  Yes Panosh, Standley Brooking, MD  rOPINIRole (REQUIP) 0.5 MG tablet TAKE ONE TO TWO TABLETS BY MOUTH DAILY AT NOON AND 5PM, AND THEN TAKE THREE TABLETS BY MOUTH EVERY NIGHT AT BEDTIME 09/05/20  Yes Panosh, Standley Brooking, MD  rosuvastatin (CRESTOR) 10 MG tablet TAKE ONE TABLET BY MOUTH  DAILY 07/31/20  Yes Panosh, Standley Brooking, MD  SYNTHROID 112 MCG tablet TAKE ONE TABLET BY MOUTH EVERY MORNING BEFORE BREAKFAST 07/31/20  Yes Panosh, Standley Brooking, MD  Blood Glucose Monitoring Suppl (FREESTYLE LITE) DEVI Use twice daily for glucose control 02/08/20   Panosh, Standley Brooking, MD  Continuous Blood Gluc Receiver (FREESTYLE  LIBRE READER) DEVI Monitor blood sugar  for 14 days  and repeat as indicated 11/12/20   Panosh, Standley Brooking, MD  Continuous Blood Gluc Sensor (FREESTYLE LIBRE 14 DAY SENSOR) MISC MONITOR BLOOD SUGAR CONTINUOUS FOR 14 DAYS AT A TIME 11/12/20   Panosh, Standley Brooking, MD  glucose blood (FREESTYLE LITE) test strip 1 each by Other route 2 (two) times daily. 02/08/20   Panosh, Standley Brooking, MD  methocarbamol (ROBAXIN) 500 MG tablet Take 1-2 tablets (500-1,000 mg total) by mouth every 8 (eight) hours as needed for muscle spasms. Patient not taking: Reported on 12/26/2020 03/30/17   Norm Parcel, PA-C    Allergies    Hydralazine, Ciprofloxacin, Propofol, Ritalin [methylphenidate hcl], and Hydralazine hcl  Review of Systems   Review of Systems  Constitutional:  Negative for appetite change.  HENT:  Negative for congestion.   Respiratory:  Negative for shortness of breath.   Cardiovascular:  Positive for leg swelling.  Gastrointestinal:  Negative for abdominal pain.  Genitourinary:  Negative for flank pain.  Musculoskeletal:  Negative for back pain.       Left lower leg pain.  Skin:  Negative for rash.  Neurological:  Negative for weakness.   Physical Exam Updated Vital Signs BP (!) 155/97 (BP Location: Right Arm)   Pulse 69   Temp 98.5 F (36.9 C)   Resp 18   Ht 4\' 11"  (1.499 m)   Wt 72.6 kg   SpO2 98%   BMI 32.32 kg/m   Physical Exam Vitals and nursing note reviewed.  HENT:     Head: Atraumatic.  Abdominal:     Tenderness: There is no abdominal tenderness.  Musculoskeletal:        General: No tenderness.     Cervical back: Neck supple.     Right lower leg: Edema present.     Left  lower leg: Edema present.     Comments: LegPitting edema to his bilateral lower extremities.  Left lower mildly tender over calf.  Dorsalis pedis pulse intact.  Foot is not cold.  No erythema.  Good range of motion in knee.  Skin:    Capillary Refill: Capillary refill takes less than 2 seconds.  Neurological:     General: No focal deficit present.     Mental Status: She is alert.    ED Results / Procedures / Treatments   Labs (all labs ordered are listed, but only abnormal results are displayed) Labs Reviewed  BASIC METABOLIC PANEL - Abnormal; Notable for the following components:      Result Value   Glucose, Bld 113 (*)    All other components within normal limits  MAGNESIUM    EKG None  Radiology US Venous Img Lower Unilateral Left  Result Date: 12/26/2020 CLINICAL DATA:  pain EXAM: LEFT LOWER EXTREMITY VENOUS DOPPLER ULTRASOUND TECHNIQUE: Gray-scale sonography with graded compression, as well as color Doppler and duplex ultrasound were performed to evaluate the lower extremity deep venous systems from the level of the common femoral vein and including the common femoral, femoral, profunda femoral, popliteal and calf veins including the posterior tibial, peroneal and gastrocnemius veins when visible. The superficial great saphenous vein was also interrogated. Spectral Doppler was utilized to evaluate flow at rest and with distal augmentation maneuvers in the common femoral, femoral and popliteal veins. COMPARISON:  None. FINDINGS: Contralateral Common Femoral Vein: Respiratory phasicity is normal and symmetric with the symptomatic side. No evidence of thrombus. Normal compressibility. Common Femoral Vein: No evidence of  thrombus. Normal compressibility, respiratory phasicity and response to augmentation. Saphenofemoral Junction: No evidence of thrombus. Normal compressibility and flow on color Doppler imaging. Profunda Femoral Vein: No evidence of thrombus. Normal compressibility and flow  on color Doppler imaging. Femoral Vein: No evidence of thrombus. Normal compressibility, respiratory phasicity and response to augmentation. Popliteal Vein: No evidence of thrombus. Normal compressibility, respiratory phasicity and response to augmentation. Calf Veins: No evidence of thrombus. Normal compressibility and flow on color Doppler imaging. Other Findings:  None. IMPRESSION: No evidence of deep venous thrombosis. Electronically Signed   By: Albin Felling M.D.   On: 12/26/2020 14:57    Procedures Procedures   Medications Ordered in ED Medications  methocarbamol (ROBAXIN) tablet 500 mg (500 mg Oral Given 12/26/20 1422)    ED Course  I have reviewed the triage vital signs and the nursing notes.  Pertinent labs & imaging results that were available during my care of the patient were reviewed by me and considered in my medical decision making (see chart for details).    MDM Rules/Calculators/A&P                           Patient with left leg cramp.  Does have a history of cramps and restless leg.  Feeling somewhat better now.  Had been at physical therapy and they reported that her left calf was larger than the other side.  If anything for me the right side looked a little larger but does have a negative Doppler on the left.  Lab work reassuring.  No clear electrolyte abnormality to cause spasms.  Discharge home.  Likely muscle spasms  Final Clinical Impression(s) / ED Diagnoses Final diagnoses:  Calf cramp    Rx / DC Orders ED Discharge Orders     None        Davonna Belling, MD 12/27/20 [phone]

## 2020-12-27 DIAGNOSIS — M25561 Pain in right knee: Secondary | ICD-10-CM | POA: Diagnosis not present

## 2020-12-27 DIAGNOSIS — M25661 Stiffness of right knee, not elsewhere classified: Secondary | ICD-10-CM | POA: Diagnosis not present

## 2020-12-27 DIAGNOSIS — Z7409 Other reduced mobility: Secondary | ICD-10-CM | POA: Diagnosis not present

## 2020-12-27 DIAGNOSIS — M1711 Unilateral primary osteoarthritis, right knee: Secondary | ICD-10-CM | POA: Diagnosis not present

## 2020-12-27 DIAGNOSIS — R29898 Other symptoms and signs involving the musculoskeletal system: Secondary | ICD-10-CM | POA: Diagnosis not present

## 2020-12-27 DIAGNOSIS — Z789 Other specified health status: Secondary | ICD-10-CM | POA: Diagnosis not present

## 2020-12-30 DIAGNOSIS — M25561 Pain in right knee: Secondary | ICD-10-CM | POA: Diagnosis not present

## 2020-12-30 DIAGNOSIS — M25661 Stiffness of right knee, not elsewhere classified: Secondary | ICD-10-CM | POA: Diagnosis not present

## 2020-12-30 DIAGNOSIS — M1711 Unilateral primary osteoarthritis, right knee: Secondary | ICD-10-CM | POA: Diagnosis not present

## 2020-12-30 DIAGNOSIS — Z789 Other specified health status: Secondary | ICD-10-CM | POA: Diagnosis not present

## 2020-12-30 DIAGNOSIS — Z7409 Other reduced mobility: Secondary | ICD-10-CM | POA: Diagnosis not present

## 2020-12-31 DIAGNOSIS — M1711 Unilateral primary osteoarthritis, right knee: Secondary | ICD-10-CM | POA: Diagnosis not present

## 2020-12-31 DIAGNOSIS — Z789 Other specified health status: Secondary | ICD-10-CM | POA: Diagnosis not present

## 2020-12-31 DIAGNOSIS — Z7409 Other reduced mobility: Secondary | ICD-10-CM | POA: Diagnosis not present

## 2020-12-31 DIAGNOSIS — R29898 Other symptoms and signs involving the musculoskeletal system: Secondary | ICD-10-CM | POA: Diagnosis not present

## 2020-12-31 DIAGNOSIS — M25561 Pain in right knee: Secondary | ICD-10-CM | POA: Diagnosis not present

## 2020-12-31 DIAGNOSIS — M25661 Stiffness of right knee, not elsewhere classified: Secondary | ICD-10-CM | POA: Diagnosis not present

## 2021-01-03 DIAGNOSIS — M1711 Unilateral primary osteoarthritis, right knee: Secondary | ICD-10-CM | POA: Diagnosis not present

## 2021-01-03 DIAGNOSIS — Z789 Other specified health status: Secondary | ICD-10-CM | POA: Diagnosis not present

## 2021-01-03 DIAGNOSIS — Z7409 Other reduced mobility: Secondary | ICD-10-CM | POA: Diagnosis not present

## 2021-01-03 DIAGNOSIS — R29898 Other symptoms and signs involving the musculoskeletal system: Secondary | ICD-10-CM | POA: Diagnosis not present

## 2021-01-03 DIAGNOSIS — M25661 Stiffness of right knee, not elsewhere classified: Secondary | ICD-10-CM | POA: Diagnosis not present

## 2021-01-03 DIAGNOSIS — M25561 Pain in right knee: Secondary | ICD-10-CM | POA: Diagnosis not present

## 2021-01-07 ENCOUNTER — Other Ambulatory Visit: Payer: Self-pay | Admitting: Internal Medicine

## 2021-01-07 DIAGNOSIS — Z7409 Other reduced mobility: Secondary | ICD-10-CM | POA: Diagnosis not present

## 2021-01-07 DIAGNOSIS — M25661 Stiffness of right knee, not elsewhere classified: Secondary | ICD-10-CM | POA: Diagnosis not present

## 2021-01-07 DIAGNOSIS — Z789 Other specified health status: Secondary | ICD-10-CM | POA: Diagnosis not present

## 2021-01-07 DIAGNOSIS — M1711 Unilateral primary osteoarthritis, right knee: Secondary | ICD-10-CM | POA: Diagnosis not present

## 2021-01-07 DIAGNOSIS — R29898 Other symptoms and signs involving the musculoskeletal system: Secondary | ICD-10-CM | POA: Diagnosis not present

## 2021-01-07 DIAGNOSIS — M25561 Pain in right knee: Secondary | ICD-10-CM | POA: Diagnosis not present

## 2021-01-09 DIAGNOSIS — M25561 Pain in right knee: Secondary | ICD-10-CM | POA: Diagnosis not present

## 2021-01-09 DIAGNOSIS — M1711 Unilateral primary osteoarthritis, right knee: Secondary | ICD-10-CM | POA: Diagnosis not present

## 2021-01-09 DIAGNOSIS — M25661 Stiffness of right knee, not elsewhere classified: Secondary | ICD-10-CM | POA: Diagnosis not present

## 2021-01-09 DIAGNOSIS — Z789 Other specified health status: Secondary | ICD-10-CM | POA: Diagnosis not present

## 2021-01-09 DIAGNOSIS — Z7409 Other reduced mobility: Secondary | ICD-10-CM | POA: Diagnosis not present

## 2021-01-09 DIAGNOSIS — R29898 Other symptoms and signs involving the musculoskeletal system: Secondary | ICD-10-CM | POA: Diagnosis not present

## 2021-01-10 DIAGNOSIS — R29898 Other symptoms and signs involving the musculoskeletal system: Secondary | ICD-10-CM | POA: Diagnosis not present

## 2021-01-10 DIAGNOSIS — Z789 Other specified health status: Secondary | ICD-10-CM | POA: Diagnosis not present

## 2021-01-10 DIAGNOSIS — Z7409 Other reduced mobility: Secondary | ICD-10-CM | POA: Diagnosis not present

## 2021-01-10 DIAGNOSIS — M25561 Pain in right knee: Secondary | ICD-10-CM | POA: Diagnosis not present

## 2021-01-10 DIAGNOSIS — M25661 Stiffness of right knee, not elsewhere classified: Secondary | ICD-10-CM | POA: Diagnosis not present

## 2021-01-10 DIAGNOSIS — M1711 Unilateral primary osteoarthritis, right knee: Secondary | ICD-10-CM | POA: Diagnosis not present

## 2021-01-13 ENCOUNTER — Ambulatory Visit: Payer: Medicare Other | Admitting: Internal Medicine

## 2021-01-14 DIAGNOSIS — M25561 Pain in right knee: Secondary | ICD-10-CM | POA: Diagnosis not present

## 2021-01-14 DIAGNOSIS — M25661 Stiffness of right knee, not elsewhere classified: Secondary | ICD-10-CM | POA: Diagnosis not present

## 2021-01-14 DIAGNOSIS — R29898 Other symptoms and signs involving the musculoskeletal system: Secondary | ICD-10-CM | POA: Diagnosis not present

## 2021-01-14 DIAGNOSIS — Z7409 Other reduced mobility: Secondary | ICD-10-CM | POA: Diagnosis not present

## 2021-01-14 DIAGNOSIS — Z789 Other specified health status: Secondary | ICD-10-CM | POA: Diagnosis not present

## 2021-01-14 DIAGNOSIS — M1711 Unilateral primary osteoarthritis, right knee: Secondary | ICD-10-CM | POA: Diagnosis not present

## 2021-01-16 DIAGNOSIS — M25661 Stiffness of right knee, not elsewhere classified: Secondary | ICD-10-CM | POA: Diagnosis not present

## 2021-01-16 DIAGNOSIS — M25561 Pain in right knee: Secondary | ICD-10-CM | POA: Diagnosis not present

## 2021-01-16 DIAGNOSIS — R29898 Other symptoms and signs involving the musculoskeletal system: Secondary | ICD-10-CM | POA: Diagnosis not present

## 2021-01-16 DIAGNOSIS — M1711 Unilateral primary osteoarthritis, right knee: Secondary | ICD-10-CM | POA: Diagnosis not present

## 2021-01-16 DIAGNOSIS — Z7409 Other reduced mobility: Secondary | ICD-10-CM | POA: Diagnosis not present

## 2021-01-16 DIAGNOSIS — Z789 Other specified health status: Secondary | ICD-10-CM | POA: Diagnosis not present

## 2021-01-21 ENCOUNTER — Encounter: Payer: Self-pay | Admitting: Internal Medicine

## 2021-01-21 ENCOUNTER — Ambulatory Visit (INDEPENDENT_AMBULATORY_CARE_PROVIDER_SITE_OTHER): Payer: Medicare Other | Admitting: Internal Medicine

## 2021-01-21 VITALS — BP 130/82 | HR 74 | Temp 98.0°F | Ht 59.0 in | Wt 166.4 lb

## 2021-01-21 DIAGNOSIS — E039 Hypothyroidism, unspecified: Secondary | ICD-10-CM | POA: Diagnosis not present

## 2021-01-21 DIAGNOSIS — E1165 Type 2 diabetes mellitus with hyperglycemia: Secondary | ICD-10-CM

## 2021-01-21 DIAGNOSIS — E785 Hyperlipidemia, unspecified: Secondary | ICD-10-CM | POA: Diagnosis not present

## 2021-01-21 DIAGNOSIS — I10 Essential (primary) hypertension: Secondary | ICD-10-CM

## 2021-01-21 DIAGNOSIS — Z79899 Other long term (current) drug therapy: Secondary | ICD-10-CM

## 2021-01-21 LAB — CBC WITH DIFFERENTIAL/PLATELET
Basophils Absolute: 0 10*3/uL (ref 0.0–0.1)
Basophils Relative: 0.7 % (ref 0.0–3.0)
Eosinophils Absolute: 0.1 10*3/uL (ref 0.0–0.7)
Eosinophils Relative: 1.1 % (ref 0.0–5.0)
HCT: 41.7 % (ref 36.0–46.0)
Hemoglobin: 13.9 g/dL (ref 12.0–15.0)
Lymphocytes Relative: 28.4 % (ref 12.0–46.0)
Lymphs Abs: 1.4 10*3/uL (ref 0.7–4.0)
MCHC: 33.4 g/dL (ref 30.0–36.0)
MCV: 93.9 fl (ref 78.0–100.0)
Monocytes Absolute: 0.4 10*3/uL (ref 0.1–1.0)
Monocytes Relative: 7.5 % (ref 3.0–12.0)
Neutro Abs: 3.1 10*3/uL (ref 1.4–7.7)
Neutrophils Relative %: 62.3 % (ref 43.0–77.0)
Platelets: 248 10*3/uL (ref 150.0–400.0)
RBC: 4.45 Mil/uL (ref 3.87–5.11)
RDW: 14.2 % (ref 11.5–15.5)
WBC: 5 10*3/uL (ref 4.0–10.5)

## 2021-01-21 LAB — HEMOGLOBIN A1C: Hgb A1c MFr Bld: 6.6 % — ABNORMAL HIGH (ref 4.6–6.5)

## 2021-01-21 LAB — LIPID PANEL
Cholesterol: 145 mg/dL (ref 0–200)
HDL: 53.8 mg/dL (ref >39.00)
LDL Cholesterol: 59 mg/dL (ref 0–99)
NonHDL: 91.38
Total CHOL/HDL Ratio: 3
Triglycerides: 160 mg/dL — ABNORMAL HIGH (ref 0.0–149.0)
VLDL: 32 mg/dL (ref 0.0–40.0)

## 2021-01-21 LAB — TSH: TSH: 2.14 u[IU]/mL (ref 0.35–5.50)

## 2021-01-21 NOTE — Patient Instructions (Addendum)
Lab today  and will let  you know results.   Will reach out again to pharmacy about the [place] and then branded synthroid.   Continue lifestyle intervention healthy eating and exercise .   Make sure BP is at goal  120 - 130 range

## 2021-01-21 NOTE — Progress Notes (Signed)
Chief Complaint  Patient presents with   Follow-up    HPI: [name] 70 y.o. come in for Chronic disease management  DM  cutting out sugars   on farxiga no se except cannot afford refill 500 $ . Seems to do ok otherwise.  Bp was 120- 130   at home .  Just got off work .  Had knee manipulation  stiff recovering  Working sitting   last night.  No flu shot . Declined (hx of se of injectables ) Had gi se of metformin.  No new problems   ROS: See pertinent positives and negatives per HPI.  Past Medical History:  Diagnosis Date   Acute pyelonephritis 05/02/2012   Asthma    Diverticulitis 03/24/2017   see report [place] [facility]   Dyspnea    nl PFTs, and echo   GERD (gastroesophageal reflux disease)    Headache(784.0)    Heart murmur    History of kidney stones    IBS (irritable bowel syndrome)    Nephrolithiasis    Pre-diabetes 12/15   Recurrent oral ulcers    Thyroid goiter    I 131 rx and then  total throidectomy 2005 [facility]    Family History  Problem Relation Age of Onset   COPD Sister    Coronary artery disease Sister        age 12 also copd   Cancer Mother        bladder   Arthritis Other    Cancer Other        breast   Diabetes Other    Hyperlipidemia Other    Hypertension Other    Stroke Other    Heart disease Other    Coronary artery disease Father        also had AAA   Aneurysm Father        Aortic and thoracic fatal    Social History   Socioeconomic History   Marital status: Married    Spouse name: Not on file   Number of children: Not on file   Years of education: Not on file   Highest education level: Not on file  Occupational History   Not on file  Tobacco Use   Smoking status: Never   Smokeless tobacco: Never  Vaping Use   Vaping Use: Never used  Substance and Sexual Activity   Alcohol use: Yes    Comment: seldom   Drug use: No   Sexual activity: Not on file  Other Topics Concern   Not on file  Social History  Narrative   Married   Has grandchildren   Never smoked    G4P4   hh of 2  care of GC ages 79 - 80 month  5 days a week   Social Determinants of Radio broadcast assistant Strain: Low Risk    Difficulty of Paying Living Expenses: Not hard at all  Food Insecurity: No Food Insecurity   Worried About Charity fundraiser in the Last Year: Never true   Arboriculturist in the Last Year: Never true  Transportation Needs: No Transportation Needs   Lack of Transportation (Medical): No   Lack of Transportation (Non-Medical): No  Physical Activity: Sufficiently Active   Days of Exercise per Week: 7 days   Minutes of Exercise per Session: 60 min  Stress: No Stress Concern Present   Feeling of Stress : Not at all  Social Connections: Moderately Integrated   Frequency  of Communication with Friends and Family: More than three times a week   Frequency of Social Gatherings with Friends and Family: More than three times a week   Attends Religious Services: More than 4 times per year   Active Member of Clubs or Organizations: No   Attends Archivist Meetings: Never   Marital Status: Married    Outpatient Medications Prior to Visit  Medication Sig Dispense Refill   acetaminophen (TYLENOL) 500 MG tablet Take 1,000 mg by mouth 3 (three) times daily.     dapagliflozin propanediol (FARXIGA) 5 MG TABS tablet Take 1 tablet (5 mg total) by mouth daily before breakfast. 30 tablet 1   docusate sodium (COLACE) 100 MG capsule Take 1 capsule (100 mg total) by mouth daily as needed for mild constipation. 10 capsule 0   methocarbamol (ROBAXIN) 500 MG tablet Take 1-2 tablets (500-1,000 mg total) by mouth every 8 (eight) hours as needed for muscle spasms. 60 tablet 0   olmesartan (BENICAR) 20 MG tablet TAKE ONE TABLET BY MOUTH DAILY 90 tablet 0   pantoprazole (PROTONIX) 40 MG tablet TAKE ONE TABLET BY MOUTH TWICE A DAY 180 tablet 0   rOPINIRole (REQUIP) 0.5 MG tablet TAKE 1 TO 2 TABLETS BY MOUTH DAILY AT  NOON AND AT 5PM , AND THEN TAKE 3 TABLETS EVERY NIGHT AT BEDTIME 540 tablet 0   rosuvastatin (CRESTOR) 10 MG tablet TAKE ONE TABLET BY MOUTH DAILY 90 tablet 0   SYNTHROID 112 MCG tablet TAKE ONE TABLET BY MOUTH EVERY MORNING BEFORE BREAKFAST 90 tablet 0   bisacodyl (DULCOLAX) 5 MG EC tablet Take 1 tablet (5 mg total) by mouth daily as needed for moderate constipation. (Patient not taking: Reported on 01/21/2021) 30 tablet 0   Blood Glucose Monitoring Suppl (FREESTYLE LITE) DEVI Use twice daily for glucose control 1 each 0   Continuous Blood Gluc Receiver (FREESTYLE LIBRE READER) DEVI Monitor blood sugar  for 14 days  and repeat as indicated 1 each PRN   Continuous Blood Gluc Sensor (FREESTYLE LIBRE 14 DAY SENSOR) MISC MONITOR BLOOD SUGAR CONTINUOUS FOR 14 DAYS AT A TIME 1 each 5   glucose blood (FREESTYLE LITE) test strip 1 each by Other route 2 (two) times daily. 100 each 12   No facility-administered medications prior to visit.     EXAM:  BP 130/82 Comment: reported from home   Pulse 74    Temp 98 F (36.7 C) (Oral)    Ht 4\' 11"  (1.499 m)    Wt 166 lb 6.4 oz (75.5 kg)    SpO2 99%    BMI 33.61 kg/m   Body mass index is 33.61 kg/m.  GENERAL: vitals reviewed and listed above, alert, oriented, appears well hydrated and in no acute distress HEENT: atraumatic, conjunctiva  clear, no obvious abnormalities on inspection of external nose and ears OP masked   NECK: no obvious masses on inspection palpation  LUNGS: clear to auscultation bilaterally, no wheezes, rales or rhonchi, good air movement CV: HRRR, no clubbing cyanosis or  peripheral edema nl cap refill   Independent gait  PSYCH: pleasant and cooperative, no obvious depression or anxiety Lab Results  Component Value Date   WBC 5.0 01/21/2021   HGB 13.9 01/21/2021   HCT 41.7 01/21/2021   PLT 248.0 01/21/2021   GLUCOSE 113 (H) 12/26/2020   CHOL 145 01/21/2021   TRIG 160.0 (H) 01/21/2021   HDL 53.80 01/21/2021   LDLDIRECT 142.0  01/28/2016   LDLCALC 59 01/21/2021  ALT 13 10/05/2020   AST 14 (L) 10/05/2020   NA 139 12/26/2020   K 3.7 12/26/2020   CL 103 12/26/2020   CREATININE 0.98 12/26/2020   BUN 17 12/26/2020   CO2 28 12/26/2020   TSH 2.14 01/21/2021   INR 1.12 03/17/2017   HGBA1C 6.6 (H) 01/21/2021   BP Readings from Last 3 Encounters:  01/21/21 130/82  12/26/20 (!) 155/97  11/11/20 (!) 166/80    ASSESSMENT AND PLAN:  Discussed the following assessment and plan:  Type 2 diabetes mellitus with hyperglycemia, without long-term current use of insulin (HCC) - Plan: TSH, Hemoglobin A1c, Lipid panel, CBC with Differential/Platelet, CBC with Differential/Platelet, Lipid panel, Hemoglobin A1c, TSH  Medication management - cost of medications interfering  - Plan: TSH, Hemoglobin A1c, Lipid panel, CBC with Differential/Platelet, CBC with Differential/Platelet, Lipid panel, Hemoglobin A1c, TSH  Essential hypertension - Plan: TSH, Hemoglobin A1c, Lipid panel, CBC with Differential/Platelet, CBC with Differential/Platelet, Lipid panel, Hemoglobin A1c, TSH  Hypothyroidism, unspecified type - Plan: TSH, Hemoglobin A1c, Lipid panel, CBC with Differential/Platelet, CBC with Differential/Platelet, Lipid panel, Hemoglobin A1c, TSH  Hyperlipidemia, unspecified hyperlipidemia type - Plan: TSH, Hemoglobin A1c, Lipid panel, CBC with Differential/Platelet, CBC with Differential/Platelet, Lipid panel, Hemoglobin A1c, TSH Farxiga cost 500  so not  picking up .  Has a few left  will send message to pharmacy abut other options  Synthroid ( brand needed)  costing 120 for 90 days.  Hard to afford  couldn't take generic in past .   ? If  letter needed to cover or other plans  Bp reported in control at home  out of office   hig on initial here  Check cbc fu of anemia  and surgery  Due for lipid panel.  Lab monitoring today  then 4-6 mos   -Patient advised to return or notify health care team  if  new concerns arise.  Patient  Instructions  Lab today  and will let  you know results.   Will reach out again to pharmacy about the [place] and then branded synthroid.   Continue lifestyle intervention healthy eating and exercise .   Make sure BP is at goal  120 - 130 range    Mariann Laster K. Eustacia Urbanek M.D.

## 2021-01-24 NOTE — Progress Notes (Signed)
Anemia better  lipids stable ldl at goal  thyroid in range . Blood sugar  a1c improved Fu 4-6 mos  as planned

## 2021-01-29 ENCOUNTER — Telehealth: Payer: Self-pay | Admitting: Pharmacist

## 2021-01-29 NOTE — Telephone Encounter (Signed)
Called [employer] and patient received the first [place] shipment on Nov 14th for a 90ds. Called patient to let her know that she should be getting the [place] only from [facility] from here on out and she should not be getting it from [facility].  Will call for tier exception for Synthroid.

## 2021-02-12 NOTE — Telephone Encounter (Signed)
Called insurance plan to start a tier exception for Synthroid as patient requested.  Representative informed me that her insurance does not allow for a tier exception for this medication.

## 2021-04-02 DIAGNOSIS — Z1231 Encounter for screening mammogram for malignant neoplasm of breast: Secondary | ICD-10-CM | POA: Diagnosis not present

## 2021-04-02 LAB — HM MAMMOGRAPHY

## 2021-04-04 ENCOUNTER — Encounter: Payer: Self-pay | Admitting: Internal Medicine

## 2021-04-07 ENCOUNTER — Other Ambulatory Visit: Payer: Self-pay | Admitting: Internal Medicine

## 2021-04-16 ENCOUNTER — Telehealth: Payer: Self-pay | Admitting: Internal Medicine

## 2021-04-16 NOTE — Telephone Encounter (Signed)
Patient called in stating that she received a 25 day supply of rOPINIRole (REQUIP) 0.5 MG tablet [[id number]]  instead of a 90 day supply. Patient is requesting to get the rest of her medication. ? ?Please advise. ?

## 2021-04-17 ENCOUNTER — Other Ambulatory Visit: Payer: Self-pay

## 2021-04-17 MED ORDER — ROPINIROLE HCL 0.5 MG PO TABS: ORAL_TABLET | ORAL | 0 refills | Status: DC

## 2021-04-17 NOTE — Telephone Encounter (Signed)
Rx sent to the pharmacy.

## 2021-05-28 ENCOUNTER — Other Ambulatory Visit: Payer: Self-pay

## 2021-05-28 ENCOUNTER — Telehealth: Payer: Self-pay

## 2021-05-28 DIAGNOSIS — G2581 Restless legs syndrome: Secondary | ICD-10-CM

## 2021-05-28 MED ORDER — ROPINIROLE HCL 0.5 MG PO TABS: 0.5000 mg | ORAL_TABLET | Freq: Three times a day (TID) | ORAL | 1 refills | Status: DC

## 2021-05-28 NOTE — Telephone Encounter (Signed)
Pharmacy requesting new prescription 90 day supply ?rOPINIRole (REQUIP) 0.5 MG tablet ? ?Last Ov 01/21/21 ?Filled 04/17/21 ?Please advise  ?

## 2021-05-28 NOTE — Telephone Encounter (Signed)
Please refill x 6 months 

## 2021-05-29 ENCOUNTER — Other Ambulatory Visit: Payer: Self-pay

## 2021-05-29 DIAGNOSIS — Z20822 Contact with and (suspected) exposure to covid-19: Secondary | ICD-10-CM | POA: Diagnosis not present

## 2021-05-29 NOTE — Telephone Encounter (Signed)
Rx sent to the pharmacy.

## 2021-06-02 DIAGNOSIS — Z20822 Contact with and (suspected) exposure to covid-19: Secondary | ICD-10-CM | POA: Diagnosis not present

## 2021-06-26 ENCOUNTER — Ambulatory Visit (INDEPENDENT_AMBULATORY_CARE_PROVIDER_SITE_OTHER): Payer: Medicare Other

## 2021-06-26 VITALS — Ht 59.0 in | Wt 166.0 lb

## 2021-06-26 DIAGNOSIS — Z0000 Encounter for general adult medical examination without abnormal findings: Secondary | ICD-10-CM

## 2021-06-26 NOTE — Patient Instructions (Addendum)
[name] , Thank you for taking time to come for your Medicare Wellness Visit. I appreciate your ongoing commitment to your health goals. Please review the following plan we discussed and let me know if I can assist you in the future.   These are the goals we discussed:  Goals       Patient Stated (pt-stated)      I want to just stay alive      Weight Loss Achieved      Evidence-based guidance:  Review medication that may contribute to weight gain, such as corticosteroid, beta-blocker, tricyclic antidepressant, oral antihyperglycemic; advocate for changes when appropriate.  Perform or refer to registered dietitian to perform comprehensive nutrition assessment that includes disordered-eating behaviors, such as binge-eating, emotional or compulsive eating, grazing.  Counsel patient regarding health risks of obesity and that weight loss goal of 5 to 10 percent of initial weight will improve risk.  Recommend initial weight loss goal of 3 to 5 percent of bodyweight; increase weight-loss goals based on patient success as achieving greater weight loss continues to reduce risk.  Propose a calorie-reduced diet based on the patient's preferences and health status.  Provide ongoing emotional support or cognitive behavioral therapy and dietitian services (individual, group, virtual) over at least 6 months with a minimum of 14 encounters to best facilitate weight loss.  Provide monthly follow-up for 12 months when weight loss goal is met to assist with maintenance of weight loss.  Encourage increased physical activity or exercise based on individual age, risk, and ability up to 200 to 300 minutes per week that includes aerobic and resistance training.  Encourage reduction in sedentary behaviors by replacing them with nonexercise yet active leisure pursuits.  Identify physical barriers, such as change in posture, balance, gait patterns, joint pain, and environmental barriers to activity.  Consider  referral to rehabilitation therapy, especially when mobility or function is impaired due to osteoarthritis and obesity.  Consider referral to weight-loss program that has published evidence of safety and efficacy if on-site intensive intervention is unavailable or patient preference.  Prepare patient for use of pharmacologic therapy as an adjunct to lifestyle changes based on body mass index, patient agreement and presence of risk factors or comorbidities.  Evaluate efficacy of pharmacologic therapy (weight loss) and tolerance to medication periodically.  Engage in shared decision-making regarding referral to bariatric surgeon for consultation and evaluation when weight-loss goal has not been accomplished by behavioral therapy with or without pharmacologic therapy.   Notes:         This is a list of the screening recommended for you and due dates:  Health Maintenance  Topic Date Due   Complete foot exam   Never done   COVID-19 Vaccine (1) 07/12/2021*   Zoster (Shingles) Vaccine (1 of 2) 07/22/2021*   Hemoglobin A1C  07/22/2021   Flu Shot  08/26/2021   Eye exam for diabetics  10/01/2021   Mammogram  04/03/2022   Tetanus Vaccine  01/25/2023   Colon Cancer Screening  09/08/2027   Pneumonia Vaccine  Completed   DEXA scan (bone density measurement)  Completed   Hepatitis C Screening: USPSTF Recommendation to screen - Ages 3-79 yo.  Completed   HPV Vaccine  Aged Out  *Topic was postponed. The date shown is not the original due date.   Advanced directives: Yes Patient will submit copy  Conditions/risks identified: None  Next appointment: Follow up in one year for your annual wellness visit    Preventive Care 65  Years and Older, Female Preventive care refers to lifestyle choices and visits with your health care provider that can promote health and wellness. What does preventive care include? A yearly physical exam. This is also called an annual well check. Dental exams once or twice a  year. Routine eye exams. Ask your health care provider how often you should have your eyes checked. Personal lifestyle choices, including: Daily care of your teeth and gums. Regular physical activity. Eating a healthy diet. Avoiding tobacco and drug use. Limiting alcohol use. Practicing safe sex. Taking low-dose aspirin every day. Taking vitamin and mineral supplements as recommended by your health care provider. What happens during an annual well check? The services and screenings done by your health care provider during your annual well check will depend on your age, overall health, lifestyle risk factors, and family history of disease. Counseling  Your health care provider may ask you questions about your: Alcohol use. Tobacco use. Drug use. Emotional well-being. Home and relationship well-being. Sexual activity. Eating habits. History of falls. Memory and ability to understand (cognition). Work and work Statistician. Reproductive health. Screening  You may have the following tests or measurements: Height, weight, and BMI. Blood pressure. Lipid and cholesterol levels. These may be checked every 5 years, or more frequently if you are over 23 years old. Skin check. Lung cancer screening. You may have this screening every year starting at age 66 if you have a 30-pack-year history of smoking and currently smoke or have quit within the past 15 years. Fecal occult blood test (FOBT) of the stool. You may have this test every year starting at age 65. Flexible sigmoidoscopy or colonoscopy. You may have a sigmoidoscopy every 5 years or a colonoscopy every 10 years starting at age 2. Hepatitis C blood test. Hepatitis B blood test. Sexually transmitted disease (STD) testing. Diabetes screening. This is done by checking your blood sugar (glucose) after you have not eaten for a while (fasting). You may have this done every 1-3 years. Bone density scan. This is done to screen for  osteoporosis. You may have this done starting at age 31. Mammogram. This may be done every 1-2 years. Talk to your health care provider about how often you should have regular mammograms. Talk with your health care provider about your test results, treatment options, and if necessary, the need for more tests. Vaccines  Your health care provider may recommend certain vaccines, such as: Influenza vaccine. This is recommended every year. Tetanus, diphtheria, and acellular pertussis (Tdap, Td) vaccine. You may need a Td booster every 10 years. Zoster vaccine. You may need this after age 33. Pneumococcal 13-valent conjugate (PCV13) vaccine. One dose is recommended after age 99. Pneumococcal polysaccharide (PPSV23) vaccine. One dose is recommended after age 60. Talk to your health care provider about which screenings and vaccines you need and how often you need them. This information is not intended to replace advice given to you by your health care provider. Make sure you discuss any questions you have with your health care provider. Document Released: 02/08/2015 Document Revised: 10/02/2015 Document Reviewed: 11/13/2014 Elsevier Interactive Patient Education  2017 [place] Prevention in the Home Falls can cause injuries. They can happen to people of all ages. There are many things you can do to make your home safe and to help prevent falls. What can I do on the outside of my home? Regularly fix the edges of walkways and driveways and fix any cracks. Remove anything that might  make you trip as you walk through a door, such as a raised step or threshold. Trim any bushes or trees on the path to your home. Use bright outdoor lighting. Clear any walking paths of anything that might make someone trip, such as rocks or tools. Regularly check to see if handrails are loose or broken. Make sure that both sides of any steps have handrails. Any raised decks and porches should have guardrails on  the edges. Have any leaves, snow, or ice cleared regularly. Use sand or salt on walking paths during winter. Clean up any spills in your garage right away. This includes oil or grease spills. What can I do in the bathroom? Use night lights. Install grab bars by the toilet and in the tub and shower. Do not use towel bars as grab bars. Use non-skid mats or decals in the tub or shower. If you need to sit down in the shower, use a plastic, non-slip stool. Keep the floor dry. Clean up any water that spills on the floor as soon as it happens. Remove soap buildup in the tub or shower regularly. Attach bath mats securely with double-sided non-slip rug tape. Do not have throw rugs and other things on the floor that can make you trip. What can I do in the bedroom? Use night lights. Make sure that you have a light by your bed that is easy to reach. Do not use any sheets or blankets that are too big for your bed. They should not hang down onto the floor. Have a firm chair that has side arms. You can use this for support while you get dressed. Do not have throw rugs and other things on the floor that can make you trip. What can I do in the kitchen? Clean up any spills right away. Avoid walking on wet floors. Keep items that you use a lot in easy-to-reach places. If you need to reach something above you, use a strong step stool that has a grab bar. Keep electrical cords out of the way. Do not use floor polish or wax that makes floors slippery. If you must use wax, use non-skid floor wax. Do not have throw rugs and other things on the floor that can make you trip. What can I do with my stairs? Do not leave any items on the stairs. Make sure that there are handrails on both sides of the stairs and use them. Fix handrails that are broken or loose. Make sure that handrails are as long as the stairways. Check any carpeting to make sure that it is firmly attached to the stairs. Fix any carpet that is loose  or worn. Avoid having throw rugs at the top or bottom of the stairs. If you do have throw rugs, attach them to the floor with carpet tape. Make sure that you have a light switch at the top of the stairs and the bottom of the stairs. If you do not have them, ask someone to add them for you. What else can I do to help prevent falls? Wear shoes that: Do not have high heels. Have rubber bottoms. Are comfortable and fit you well. Are closed at the toe. Do not wear sandals. If you use a stepladder: Make sure that it is fully opened. Do not climb a closed stepladder. Make sure that both sides of the stepladder are locked into place. Ask someone to hold it for you, if possible. Clearly mark and make sure that you can see: Any  grab bars or handrails. First and last steps. Where the edge of each step is. Use tools that help you move around (mobility aids) if they are needed. These include: Canes. Walkers. Scooters. Crutches. Turn on the lights when you go into a dark area. Replace any light bulbs as soon as they burn out. Set up your furniture so you have a clear path. Avoid moving your furniture around. If any of your floors are uneven, fix them. If there are any pets around you, be aware of where they are. Review your medicines with your doctor. Some medicines can make you feel dizzy. This can increase your chance of falling. Ask your doctor what other things that you can do to help prevent falls. This information is not intended to replace advice given to you by your health care provider. Make sure you discuss any questions you have with your health care provider. Document Released: 11/08/2008 Document Revised: 06/20/2015 Document Reviewed: 02/16/2014 Elsevier Interactive Patient Education  2017 [employer].

## 2021-06-26 NOTE — Progress Notes (Signed)
Subjective:   [name] is a 71 y.o. female who presents for Medicare Annual (Subsequent) preventive examination.  Review of Systems    Virtual Visit via Telephone Note  I connected with  [name] on 06/26/21 at  8:15 AM EDT by telephone and verified that I am speaking with the correct person using two identifiers.  Location: Patient: Home Provider: Office Persons participating in the virtual visit: patient/Nurse Health Advisor   I discussed the limitations, risks, security and privacy concerns of performing an evaluation and management service by telephone and the availability of in person appointments. The patient expressed understanding and agreed to proceed.  Interactive audio and video telecommunications were attempted between this nurse and patient, however failed, due to patient having technical difficulties OR patient did not have access to video capability.  We continued and completed visit with audio only.  Some vital signs may be absent or patient reported.   Criselda Peaches, LPN        Objective:    There were no vitals filed for this visit. There is no height or weight on file to calculate BMI.     12/26/2020    1:10 PM 10/04/2020   11:57 PM 06/25/2020   10:01 AM 02/12/2020    7:54 AM 03/24/2017   10:03 AM 03/16/2017    9:04 PM 03/16/2017   10:33 AM  Advanced Directives  Does Patient Have a Medical Advance Directive? Yes No Yes Yes  Yes Yes  Type of Paramedic of [place];Living will  [place];Living will  [place];Living will [place];Living will [place];Living will  Does patient want to make changes to medical advance directive?   No - Patient declined No - Patient declined  No - Patient declined   Copy of [place] in Chart?   No - copy requested   Yes   Would patient like information on creating a medical advance directive?   No - Patient declined   No - Patient declined No - Patient declined No - Patient declined    Current Medications (verified) Outpatient Encounter Medications as of 06/26/2021  Medication Sig   acetaminophen (TYLENOL) 500 MG tablet Take 1,000 mg by mouth 3 (three) times daily.   bisacodyl (DULCOLAX) 5 MG EC tablet Take 1 tablet (5 mg total) by mouth daily as needed for moderate constipation. (Patient not taking: Reported on 01/21/2021)   Blood Glucose Monitoring Suppl (FREESTYLE LITE) DEVI Use twice daily for glucose control   Continuous Blood Gluc Receiver (FREESTYLE LIBRE READER) DEVI Monitor blood sugar  for 14 days  and repeat as indicated   Continuous Blood Gluc Sensor (FREESTYLE LIBRE 14 DAY SENSOR) MISC MONITOR BLOOD SUGAR CONTINUOUS FOR 14 DAYS AT A TIME   dapagliflozin propanediol (FARXIGA) 5 MG TABS tablet Take 1 tablet (5 mg total) by mouth daily before breakfast.   docusate sodium (COLACE) 100 MG capsule Take 1 capsule (100 mg total) by mouth daily as needed for mild constipation.   glucose blood (FREESTYLE LITE) test strip 1 each by Other route 2 (two) times daily.   methocarbamol (ROBAXIN) 500 MG tablet Take 1-2 tablets (500-1,000 mg total) by mouth every 8 (eight) hours as needed for muscle spasms.   olmesartan (BENICAR) 20 MG tablet TAKE ONE TABLET BY MOUTH DAILY   pantoprazole (PROTONIX) 40 MG tablet TAKE ONE TABLET BY MOUTH TWICE A DAY   rOPINIRole (REQUIP) 0.5 MG tablet  Take 1 tablet (0.5 mg total) by mouth 3 (three) times daily.   rosuvastatin (CRESTOR) 10 MG tablet TAKE ONE TABLET BY MOUTH DAILY   SYNTHROID 112 MCG tablet TAKE ONE TABLET BY MOUTH EVERY MORNING BEFORE BREAKFAST   No facility-administered encounter medications on file as of 06/26/2021.    Allergies (verified) Hydralazine, Ciprofloxacin, Propofol, Ritalin [methylphenidate hcl], and Hydralazine hcl   History: Past Medical History:  Diagnosis Date   Acute pyelonephritis 05/02/2012   Asthma    Diverticulitis  03/24/2017   see report [place] [facility]   Dyspnea    nl PFTs, and echo   GERD (gastroesophageal reflux disease)    Headache(784.0)    Heart murmur    History of kidney stones    IBS (irritable bowel syndrome)    Nephrolithiasis    Pre-diabetes 12/15   Recurrent oral ulcers    Thyroid goiter    I 131 rx and then  total throidectomy 2005 [facility]   Past Surgical History:  Procedure Laterality Date   ABDOMINAL HYSTERECTOMY  1994   fibroid   APPENDECTOMY  1968   CYSTOSCOPY WITH STENT PLACEMENT Bilateral 03/24/2017   Procedure: BILATERAL URETERAL CATHETER  PLACEMENT;  Surgeon: Alexis Frock, MD;  Location: [facility];  Service: Urology;  Laterality: Bilateral;   CYSTOSCOPY/RETROGRADE/URETEROSCOPY  03/24/2017   Procedure: CYSTOSCOPY/RETROGRADE/URETEROSCOPY;  Surgeon: Alexis Frock, MD;  Location: [facility];  Service: Urology;;   LAPAROSCOPIC PARTIAL COLECTOMY Left 03/24/2017   Procedure: LAPAROSCOPIC ASSISTED LEFT  SIGMOID COLECTOMY;  Surgeon: Alphonsa Overall, MD;  Location: [facility];  Service: General;  Laterality: Left;   orif left tivial plateau fracture     Dr. Collier [place] 2/08   plates and pins take out of left knee  06/2008   SIGMOIDOSCOPY  03/24/2017   Procedure: SIGMOIDOSCOPY;  Surgeon: Alphonsa Overall, MD;  Location: [facility];  Service: General;;   [place]  2005   for  large nodule 2006   Family History  Problem Relation Age of Onset   COPD Sister    Coronary artery disease Sister        age 67 also copd   Cancer Mother        bladder   Arthritis Other    Cancer Other        breast   Diabetes Other    Hyperlipidemia Other    Hypertension Other    Stroke Other    Heart disease Other    Coronary artery disease Father        also had AAA   Aneurysm Father        Aortic and thoracic fatal   Social History   Socioeconomic History   Marital status: Married    Spouse name: Not on file   Number of children: Not on file   Years of  education: Not on file   Highest education level: Not on file  Occupational History   Not on file  Tobacco Use   Smoking status: Never   Smokeless tobacco: Never  Vaping Use   Vaping Use: Never used  Substance and Sexual Activity   Alcohol use: Yes    Comment: seldom   Drug use: No   Sexual activity: Not on file  Other Topics Concern   Not on file  Social History Narrative   Married   Has grandchildren   Never smoked    G4P4   hh of 2  care of GC ages 21 - 83 month  5 days a week   Social Determinants of Health   Financial Resource Strain: Not on file  Food Insecurity: Not on file  Transportation Needs: Not on file  Physical Activity: Not on file  Stress: Not on file  Social Connections: Not on file     Clinical Intake:    Diabetic? Pre Diabetic    Activities of Daily Living     View : No data to display.          Patient Care Team: Panosh, Standley Brooking, MD as PCP - General Josue Hector, MD as PCP - Cardiology (Cardiology) Sallyanne Havers, MD as Referring Physician (Orthopedic Surgery) Lowella Bandy, MD (Inactive) as Attending Physician (Urology) Josue Hector, MD as Consulting Physician (Cardiology) Richmond Campbell, MD as Consulting Physician (Gastroenterology) Case, [place], MD as Referring Physician (Orthopedic Surgery)  Indicate any recent Medical Services you may have received from other than [facility] providers in the past year (date may be approximate).     Assessment:   This is a routine wellness examination for [place].  Hearing/Vision screen No results found.  Dietary issues and exercise activities discussed:     Goals Addressed   None    Depression Screen    01/21/2021    8:34 AM 06/25/2020    9:59 AM 02/12/2020    7:54 AM 11/06/2019    2:53 PM 06/20/2019    9:30 AM 03/09/2017   11:11 AM 01/28/2016    9:36 AM  PHQ 2/9 Scores  PHQ - 2 Score 0 0 0 0 0 0 0  PHQ- 9 Score 0    0      Fall Risk    01/21/2021    8:33 AM 06/25/2020   10:02 AM  02/12/2020    7:54 AM 11/06/2019    2:52 PM 06/20/2019    9:32 AM  Fall Risk   Falls in the past year? 1 0 0 0 0  Number falls in past yr: 0 0  0   Injury with Fall? 1 0  0   Follow up  Falls evaluation completed  Falls evaluation completed     FALL RISK PREVENTION PERTAINING TO THE HOME:  Any stairs in or around the home? No  If so, are there any without handrails? No  Home free of loose throw rugs in walkways, pet beds, electrical cords, etc? Yes  Adequate lighting in your home to reduce risk of falls? Yes   ASSISTIVE DEVICES UTILIZED TO PREVENT FALLS:  Life alert? No  Use of a cane, walker or w/c? No  Grab bars in the bathroom? Yes  Shower chair or bench in shower? Yes  Elevated toilet seat or a handicapped toilet? No   TIMED UP AND GO:  Was the test performed? No . Audio Visit    Cognitive Function:        Immunizations Immunization History  Administered Date(s) Administered   Fluad Quad(high Dose 65+) 11/09/2020   Pneumococcal Conjugate-13 01/28/2016   Pneumococcal Polysaccharide-23 03/09/2017   Td 11/26/2001   Tdap 01/24/2013   Zoster, Live 01/24/2013    TDAP status: Up to date  Flu Vaccine status: Up to date  Pneumococcal vaccine status: Up to date  Covid-19 vaccine status: Declined, Education has been provided regarding the importance of this vaccine but patient still declined. Advised may receive this vaccine at local pharmacy or Health Dept.or vaccine clinic. Aware to provide a copy of the vaccination record if obtained from local pharmacy or Health Dept.  Verbalized acceptance and understanding.  Qualifies for Shingles Vaccine? Yes   Zostavax completed No   Shingrix Completed?: No.    Education has been provided regarding the importance of this vaccine. Patient has been advised to call insurance company to determine out of pocket expense if they have not yet received this vaccine. Advised may also receive vaccine at local pharmacy or Health Dept.  Verbalized acceptance and understanding.  Screening Tests Health Maintenance  Topic Date Due   COVID-19 Vaccine (1) Never done   FOOT EXAM  Never done   Zoster Vaccines- Shingrix (1 of 2) 07/22/2021 (Originally 07/19/1969)   HEMOGLOBIN A1C  07/22/2021   INFLUENZA VACCINE  08/26/2021   OPHTHALMOLOGY EXAM  10/01/2021   MAMMOGRAM  04/03/2022   TETANUS/TDAP  01/25/2023   COLONOSCOPY (Pts 45-17yr Insurance coverage will need to be confirmed)  09/08/2027   Pneumonia Vaccine 71 Years old  Completed   DEXA SCAN  Completed   Hepatitis C Screening  Completed   HPV VACCINES  Aged Out    Health Maintenance  Health Maintenance Due  Topic Date Due   COVID-19 Vaccine (1) Never done   FOOT EXAM  Never done    Colorectal cancer screening: Referral to GI placed Scheduled 07/26/21. Pt aware the office will call re: appt.  Mammogram status: Completed 04/02/21. Repeat every year  Bone Density status: Completed 03/22/19. Results reflect: Bone density results: OSTEOPENIA. Repeat every 5 years.  Lung Cancer Screening: (Low Dose CT Chest recommended if Age 71-80years, 30 pack-year currently smoking OR have quit w/in 15years.) does not qualify.     Additional Screening:  Hepatitis C Screening: does qualify; Completed 03/01/15  Vision Screening: Recommended annual ophthalmology exams for early detection of glaucoma and other disorders of the eye. Is the patient up to date with their annual eye exam?  Yes  Who is the provider or what is the name of the office in which the patient attends annual eye exams? Dr HHerbert DeanerIf pt is not established with a provider, would they like to be referred to a provider to establish care? No .   Dental Screening: Recommended annual dental exams for proper oral hygiene  Community Resource Referral / Chronic Care Management:  CRR required this visit?  No   CCM required this visit?  No      Plan:     I have personally reviewed and noted the following in the  patient's chart:   Medical and social history Use of alcohol, tobacco or illicit drugs  Current medications and supplements including opioid prescriptions.  Functional ability and status Nutritional status Physical activity Advanced directives List of other physicians Hospitalizations, surgeries, and ER visits in previous 12 months Vitals Screenings to include cognitive, depression, and falls Referrals and appointments  In addition, I have reviewed and discussed with patient certain preventive protocols, quality metrics, and best practice recommendations. A written personalized care plan for preventive services as well as general preventive health recommendations were provided to patient.     BCriselda Peaches LPN   [id number]  Nurse Notes: Patient request f/u with concerns of elevated B/P x 3 days. 175/92 being the highest.

## 2021-06-27 ENCOUNTER — Encounter (HOSPITAL_BASED_OUTPATIENT_CLINIC_OR_DEPARTMENT_OTHER): Payer: Self-pay | Admitting: Obstetrics and Gynecology

## 2021-06-27 ENCOUNTER — Other Ambulatory Visit: Payer: Self-pay

## 2021-06-27 ENCOUNTER — Emergency Department (HOSPITAL_BASED_OUTPATIENT_CLINIC_OR_DEPARTMENT_OTHER)
Admission: EM | Admit: 2021-06-27 | Discharge: 2021-06-27 | Disposition: A | Discharge status: Home / Self Care | Payer: Medicare Other | Attending: Emergency Medicine | Admitting: Emergency Medicine

## 2021-06-27 DIAGNOSIS — J45909 Unspecified asthma, uncomplicated: Secondary | ICD-10-CM | POA: Insufficient documentation

## 2021-06-27 DIAGNOSIS — I10 Essential (primary) hypertension: Secondary | ICD-10-CM | POA: Diagnosis not present

## 2021-06-27 DIAGNOSIS — Z79899 Other long term (current) drug therapy: Secondary | ICD-10-CM | POA: Insufficient documentation

## 2021-06-27 DIAGNOSIS — R030 Elevated blood-pressure reading, without diagnosis of hypertension: Secondary | ICD-10-CM | POA: Diagnosis present

## 2021-06-27 LAB — BASIC METABOLIC PANEL
Anion gap: 8 (ref 5–15)
BUN: 21 mg/dL (ref 8–23)
CO2: 29 mmol/L (ref 22–32)
Calcium: 9.7 mg/dL (ref 8.9–10.3)
Chloride: 103 mmol/L (ref 98–111)
Creatinine, Ser: 0.74 mg/dL (ref 0.44–1.00)
GFR, Estimated: >60 mL/min (ref >60)
Glucose, Bld: 129 mg/dL — ABNORMAL HIGH (ref 70–99)
Potassium: 4.1 mmol/L (ref 3.5–5.1)
Sodium: 140 mmol/L (ref 135–145)

## 2021-06-27 LAB — URINALYSIS, ROUTINE W REFLEX MICROSCOPIC
Bilirubin Urine: NEGATIVE
Glucose, UA: NEGATIVE mg/dL
Hgb urine dipstick: NEGATIVE
Ketones, ur: NEGATIVE mg/dL
Leukocytes,Ua: NEGATIVE
Nitrite: NEGATIVE
Protein, ur: NEGATIVE mg/dL
Specific Gravity, Urine: 1.01 (ref 1.005–1.030)
pH: 5.5 (ref 5.0–8.0)

## 2021-06-27 LAB — CBC
HCT: 42.6 % (ref 36.0–46.0)
Hemoglobin: 14.2 g/dL (ref 12.0–15.0)
MCH: 31.6 pg (ref 26.0–34.0)
MCHC: 33.3 g/dL (ref 30.0–36.0)
MCV: 94.7 fL (ref 80.0–100.0)
Platelets: 249 10*3/uL (ref 150–400)
RBC: 4.5 MIL/uL (ref 3.87–5.11)
RDW: 13.4 % (ref 11.5–15.5)
WBC: 5 10*3/uL (ref 4.0–10.5)
nRBC: 0 % (ref 0.0–0.2)

## 2021-06-27 MED ORDER — IRBESARTAN 150 MG PO TABS
150.0000 mg | ORAL_TABLET | Freq: Once | ORAL | Status: AC
  Administered 2021-06-27: 150 mg via ORAL
  Filled 2021-06-27: qty 1

## 2021-06-27 NOTE — Discharge Instructions (Addendum)
It was a pleasure caring for you today in the emergency department.  Please return to the emergency department for any worsening or worrisome symptoms.  Please follow up with your PCP regarding blood pressure medications.

## 2021-06-27 NOTE — ED Notes (Signed)
Discharge instructions and follow up care reviewed and explained, pt verbalized understanding. Pt was caox4, ambulatory, and in no obvious distress on departure.

## 2021-06-27 NOTE — ED Triage Notes (Signed)
Patient reports to the ER for hypertension. (170's/90's). Patient reports some mild headache symptoms with slight blurred vision but reports she also has cataracts. Patient reports she has also had some mild shortness of breath. Denies chest pain.

## 2021-06-27 NOTE — ED Provider Notes (Signed)
[facility] DEPT Provider Note   CSN: [id number] Arrival date & time: 06/27/21  6644     History  Chief Complaint  Patient presents with   Hypertension    [place] is a 71 y.o. female.  Patient as above with significant medical history as below, including hypertension, IBS, prediabetes who presents to the ED with complaint of elevated blood pressure.  Patient complains elevated blood pressure for the past week.  She has been compliant with her home olmesartan.  She did not take her olmesartan this morning.  She has been having intermittent headache associated with her elevated blood pressure.  Fatigue.  No palpitations, dyspnea, nausea or vomiting, no near syncope.  No lightheadedness.  She reports that she spoke with her PCP office this morning who advised her to come to the ER for evaluation.  No chest pain  Headache rated a 1/10 on arrival     Past Medical History:  Diagnosis Date   Acute pyelonephritis 05/02/2012   Asthma    Diverticulitis 03/24/2017   see report [place] [facility]   Dyspnea    nl PFTs, and echo   GERD (gastroesophageal reflux disease)    Headache(784.0)    Heart murmur    History of kidney stones    IBS (irritable bowel syndrome)    Nephrolithiasis    Pre-diabetes 12/15   Recurrent oral ulcers    Thyroid goiter    I 131 rx and then  total throidectomy 2005 [facility]    Past Surgical History:  Procedure Laterality Date   ABDOMINAL HYSTERECTOMY  1994   fibroid   APPENDECTOMY  1968   CYSTOSCOPY WITH STENT PLACEMENT Bilateral 03/24/2017   Procedure: BILATERAL URETERAL CATHETER  PLACEMENT;  Surgeon: Alexis Frock, MD;  Location: [facility];  Service: Urology;  Laterality: Bilateral;   CYSTOSCOPY/RETROGRADE/URETEROSCOPY  03/24/2017   Procedure: CYSTOSCOPY/RETROGRADE/URETEROSCOPY;  Surgeon: Alexis Frock, MD;  Location: [facility];  Service: Urology;;   LAPAROSCOPIC PARTIAL COLECTOMY Left 03/24/2017   Procedure:  LAPAROSCOPIC ASSISTED LEFT  SIGMOID COLECTOMY;  Surgeon: Alphonsa Overall, MD;  Location: [facility];  Service: General;  Laterality: Left;   orif left tivial plateau fracture     Dr. Collier [place]ina 2/08   plates and pins take out of left knee  06/2008   SIGMOIDOSCOPY  03/24/2017   Procedure: SIGMOIDOSCOPY;  Surgeon: Alphonsa Overall, MD;  Location: [facility];  Service: General;;   [facility]  2005   for  large nodule 2006     The history is provided by the patient. No language interpreter was used.  Hypertension Associated symptoms include headaches. Pertinent negatives include no chest pain, no abdominal pain and no shortness of breath.      Home Medications Prior to Admission medications   Medication Sig Start Date End Date Taking? Authorizing Provider  acetaminophen (TYLENOL) 500 MG tablet Take 1,000 mg by mouth 3 (three) times daily.    [provider]  bisacodyl (DULCOLAX) 5 MG EC tablet Take 1 tablet (5 mg total) by mouth daily as needed for moderate constipation. Patient not taking: Reported on 01/21/2021 03/30/17   Hosie Poisson, MD  Blood Glucose Monitoring Suppl (FREESTYLE LITE) DEVI Use twice daily for glucose control 02/08/20   Panosh, Standley Brooking, MD  Continuous Blood Gluc Receiver (FREESTYLE LIBRE READER) DEVI Monitor blood sugar  for 14 days  and repeat as indicated 11/12/20   Panosh, Standley Brooking, MD  Continuous Blood Gluc Sensor (FREESTYLE LIBRE 14 DAY  SENSOR) MISC MONITOR BLOOD SUGAR CONTINUOUS FOR 14 DAYS AT A TIME 11/12/20   Panosh, Standley Brooking, MD  dapagliflozin propanediol (FARXIGA) 5 MG TABS tablet Take 1 tablet (5 mg total) by mouth daily before breakfast. 11/25/20   Panosh, Standley Brooking, MD  docusate sodium (COLACE) 100 MG capsule Take 1 capsule (100 mg total) by mouth daily as needed for mild constipation. 03/30/17   Hosie Poisson, MD  glucose blood (FREESTYLE LITE) test strip 1 each by Other route 2 (two) times daily. 02/08/20   Panosh, Standley Brooking, MD  methocarbamol  (ROBAXIN) 500 MG tablet Take 1-2 tablets (500-1,000 mg total) by mouth every 8 (eight) hours as needed for muscle spasms. 03/30/17   Norm Parcel, PA-C  olmesartan (BENICAR) 20 MG tablet TAKE ONE TABLET BY MOUTH DAILY 04/08/21   Panosh, Standley Brooking, MD  pantoprazole (PROTONIX) 40 MG tablet TAKE ONE TABLET BY MOUTH TWICE A DAY 04/08/21   Panosh, Standley Brooking, MD  rOPINIRole (REQUIP) 0.5 MG tablet Take 1 tablet (0.5 mg total) by mouth 3 (three) times daily. 05/28/21 08/26/21  Panosh, Standley Brooking, MD  rosuvastatin (CRESTOR) 10 MG tablet TAKE ONE TABLET BY MOUTH DAILY 04/08/21   Panosh, Standley Brooking, MD  SYNTHROID 112 MCG tablet TAKE ONE TABLET BY MOUTH EVERY MORNING BEFORE BREAKFAST 04/08/21   Panosh, Standley Brooking, MD      Allergies    Hydralazine, Ciprofloxacin, Propofol, Ritalin [methylphenidate hcl], and Hydralazine hcl    Review of Systems   Review of Systems  Constitutional:  Negative for chills and fever.  HENT:  Negative for facial swelling and trouble swallowing.   Eyes:  Negative for photophobia and visual disturbance.  Respiratory:  Negative for cough and shortness of breath.   Cardiovascular:  Negative for chest pain and palpitations.  Gastrointestinal:  Negative for abdominal pain, nausea and vomiting.  Endocrine: Negative for polydipsia and polyuria.  Genitourinary:  Negative for difficulty urinating and hematuria.  Musculoskeletal:  Negative for gait problem and joint swelling.  Skin:  Negative for pallor and rash.  Neurological:  Positive for headaches. Negative for syncope.  Psychiatric/Behavioral:  Negative for agitation and confusion.    Physical Exam Updated Vital Signs BP (!) 153/85   Pulse 75   Temp 98.2 F (36.8 C)   Resp 19   SpO2 94%  Physical Exam Vitals and nursing note reviewed.  Constitutional:      General: She is not in acute distress.    Appearance: Normal appearance.  HENT:     Head: Normocephalic and atraumatic.     Right Ear: External ear normal.     Left Ear: External ear  normal.     Nose: Nose normal.     Mouth/Throat:     Mouth: Mucous membranes are moist.  Eyes:     General: No scleral icterus.       Right eye: No discharge.        Left eye: No discharge.  Cardiovascular:     Rate and Rhythm: Normal rate and regular rhythm.     Pulses: Normal pulses.     Heart sounds: Normal heart sounds.    No S3 or S4 sounds.     Comments: Trace lower extremity edema bilateral Pulmonary:     Effort: Pulmonary effort is normal. No respiratory distress.     Breath sounds: Normal breath sounds.  Abdominal:     General: Abdomen is flat.     Tenderness: There is no abdominal tenderness.  Musculoskeletal:  General: Normal range of motion.     Cervical back: Normal range of motion.     Right lower leg: Edema present.     Left lower leg: Edema present.  Skin:    General: Skin is warm and dry.     Capillary Refill: Capillary refill takes less than 2 seconds.  Neurological:     Mental Status: She is alert and oriented to person, place, and time.     GCS: GCS eye subscore is 4. GCS verbal subscore is 5. GCS motor subscore is 6.     Cranial Nerves: Cranial nerves 2-12 are intact. No dysarthria.     Sensory: Sensation is intact.     Motor: Motor function is intact. No tremor.     Coordination: Coordination is intact.     Gait: Gait is intact.  Psychiatric:        Mood and Affect: Mood normal.        Behavior: Behavior normal.    ED Results / Procedures / Treatments   Labs (all labs ordered are listed, but only abnormal results are displayed) Labs Reviewed  BASIC METABOLIC PANEL - Abnormal; Notable for the following components:      Result Value   Glucose, Bld 129 (*)    All other components within normal limits  URINALYSIS, ROUTINE W REFLEX MICROSCOPIC - Abnormal; Notable for the following components:   Color, Urine COLORLESS (*)    All other components within normal limits  CBC    EKG EKG Interpretation  Date/Time:  Friday June 27 2021 09:49:05  EDT Ventricular Rate:  61 PR Interval:  162 QRS Duration: 82 QT Interval:  422 QTC Calculation: 424 R Axis:   36 Text Interpretation: Normal sinus rhythm Normal ECG When compared with ECG of 20-Mar-2017 13:32, T wave inversion no longer evident in Inferior leads Confirmed by Wynona Dove ([id number]) on 06/27/2021 1:03:40 PM  Radiology No results found.  Procedures Procedures    Medications Ordered in ED Medications  irbesartan (AVAPRO) tablet 150 mg (150 mg Oral Given 06/27/21 1148)    ED Course/ Medical Decision Making/ A&P                           Medical Decision Making Amount and/or Complexity of Data Reviewed Labs: ordered.  Risk Prescription drug management.    CC: Elevated blood pressure  This patient presents to the Emergency Department for the above complaint. This involves an extensive number of treatment options and is a complaint that carries with it a high risk of complications and morbidity. Vital signs were reviewed. Serious etiologies considered.  Record review:  Previous records obtained and reviewed  PCP office visit yesterday, prior ED visits, prior labs and imaging, home medications  Additional history obtained from [facility] and surgical history as noted above.   Work up as above, notable for:  Labs & imaging results that were available during my care of the patient were visualized by me and considered in my medical decision making.  Cardiac monitoring reviewed and interpreted personally which shows NSR  Labs reviewed and are stable, she does have mildly elevated glucose, no evidence of DKA.  Advised to follow-up with PCP regarding mild elevated glucose.  Elevated blood pressure reading.   Management: Given home antihypertensive  Reassessment:  Blood pressure improved.  Admission was considered.   Symptoms likely secondary to poorly controlled blood pressure.  She has no evidence of endorgan damage  Patient asymptomatic with no noted s/s  of end organ damage.  No chest pain, diaphoresis, nausea or other acs symptoms.    No unequal pulses, normal pulse ox without rales or sob.  Feel this is unlikely to be a Hypertensive Emergency and recent studies suggest no benefit for inpatient admission.  There are also no studies to my knowledge suggesting that patients with hypertensive urgency have increased risk for end organ disease. In fact there has been a study recently that would suggest that the rapid change can induce harm.  The [employer] [facility] [employer] policy statement on asymptomatic hypertension does not  recommend routing ED medical intervention. The patient will follow up closely with their PCP.  Compliance with their medication stressed.   Lowell Guitar, Cicero Duck EH, et al. Characteristics and outcomes of patients presenting with hypertensive urgency in the office setting. JAMA Intern Med. 2016 Jul 1; 176(7): 981-8.   Cerebrovascular risks with rapid blood pressure lowering in the absence of hypertensive emergency Narda Bonds, Wende Crease, et al. Am J Emerg Med. 2019;37(6):1073-1077.    The patient improved significantly and was discharged in stable condition. Detailed discussions were had with the patient regarding current findings, and need for close f/u with PCP or on call doctor. The patient has been instructed to return immediately if the symptoms worsen in any way for re-evaluation. Patient verbalized understanding and is in agreement with current care plan. All questions answered prior to discharge.           Social determinants of health include -  Social History   Socioeconomic History   Marital status: Married    Spouse name: Not on file   Number of children: Not on file   Years of education: Not on file   Highest education level: Not on file  Occupational History   Not on file  Tobacco Use   Smoking status: Never   Smokeless tobacco: Never  Vaping Use   Vaping Use: Never used   Substance and Sexual Activity   Alcohol use: Yes    Comment: seldom   Drug use: No   Sexual activity: Not on file  Other Topics Concern   Not on file  Social History Narrative   Married   Has grandchildren   Never smoked    G4P4   hh of 2  care of GC ages 4 - 80 month  5 days a week   Social Determinants of Radio broadcast assistant Strain: Low Risk    Difficulty of Paying Living Expenses: Not hard at all  Food Insecurity: No Food Insecurity   Worried About Charity fundraiser in the Last Year: Never true   Arboriculturist in the Last Year: Never true  Transportation Needs: No Transportation Needs   Lack of Transportation (Medical): No   Lack of Transportation (Non-Medical): No  Physical Activity: Inactive   Days of Exercise per Week: 0 days   Minutes of Exercise per Session: 0 min  Stress: No Stress Concern Present   Feeling of Stress : Not at all  Social Connections: Socially Integrated   Frequency of Communication with Friends and Family: More than three times a week   Frequency of Social Gatherings with Friends and Family: More than three times a week   Attends Religious Services: More than 4 times per year   Active Member of [employer] or Organizations: Yes   Attends Archivist Meetings: More than 4  times per year   Marital Status: Married  Human resources officer Violence: Not At Risk   Fear of Current or Ex-Partner: No   Emotionally Abused: No   Physically Abused: No   Sexually Abused: No      This chart was dictated using Armed forces training and education officer.  Despite best efforts to proofread,  errors can occur which can change the documentation meaning.         Final Clinical Impression(s) / ED Diagnoses Final diagnoses:  Elevated blood pressure reading    Rx / DC Orders ED Discharge Orders     None         Jeanell Sparrow, DO 06/27/21 1313

## 2021-06-30 ENCOUNTER — Encounter: Payer: Self-pay | Admitting: Internal Medicine

## 2021-06-30 ENCOUNTER — Other Ambulatory Visit: Payer: Self-pay | Admitting: Internal Medicine

## 2021-06-30 ENCOUNTER — Telehealth: Payer: Self-pay | Admitting: Internal Medicine

## 2021-06-30 ENCOUNTER — Ambulatory Visit (INDEPENDENT_AMBULATORY_CARE_PROVIDER_SITE_OTHER): Payer: Medicare Other | Admitting: Internal Medicine

## 2021-06-30 VITALS — BP 168/90 | HR 55 | Temp 98.1°F | Ht 59.0 in | Wt 177.0 lb

## 2021-06-30 DIAGNOSIS — R0609 Other forms of dyspnea: Secondary | ICD-10-CM

## 2021-06-30 DIAGNOSIS — E118 Type 2 diabetes mellitus with unspecified complications: Secondary | ICD-10-CM | POA: Diagnosis not present

## 2021-06-30 DIAGNOSIS — I10 Essential (primary) hypertension: Secondary | ICD-10-CM | POA: Insufficient documentation

## 2021-06-30 LAB — URINALYSIS, ROUTINE W REFLEX MICROSCOPIC
Bilirubin Urine: NEGATIVE
Hgb urine dipstick: NEGATIVE
Ketones, ur: NEGATIVE
Leukocytes,Ua: NEGATIVE
Nitrite: NEGATIVE
RBC / HPF: NONE SEEN (ref 0–?)
Specific Gravity, Urine: 1.025 (ref 1.000–1.030)
Total Protein, Urine: NEGATIVE
Urine Glucose: NEGATIVE
Urobilinogen, UA: 0.2 (ref 0.0–1.0)
pH: 6 (ref 5.0–8.0)

## 2021-06-30 LAB — TROPONIN I (HIGH SENSITIVITY): High Sens Troponin I: 5 ng/L (ref 2–17)

## 2021-06-30 LAB — MICROALBUMIN / CREATININE URINE RATIO
Creatinine,U: 75.6 mg/dL
Microalb Creat Ratio: 1.5 mg/g (ref 0.0–30.0)
Microalb, Ur: 1.2 mg/dL (ref 0.0–1.9)

## 2021-06-30 LAB — TSH: TSH: 3.48 u[IU]/mL (ref 0.35–5.50)

## 2021-06-30 MED ORDER — INDAPAMIDE 1.25 MG PO TABS: 1.2500 mg | ORAL_TABLET | Freq: Every day | ORAL | 0 refills | Status: DC

## 2021-06-30 NOTE — Progress Notes (Unsigned)
Subjective:  Patient ID: [name], female    DOB: Dec 07, 1950  Age: 71 y.o. MRN: [id number]  CC: Hypertension and Diabetes   HPI [name] presents for f/up  She complains of a one week hx of elevated BP with HA, BV, DOE, and edema. She denies CP or diaphoresis. She does not take nsaids or decongestants. She is compliant with the ARB. She was seen in the ED 3 days ago and EKG and labs were normal.  Outpatient Medications Prior to Visit  Medication Sig Dispense Refill   acetaminophen (TYLENOL) 500 MG tablet Take 1,000 mg by mouth 3 (three) times daily.     bisacodyl (DULCOLAX) 5 MG EC tablet Take 1 tablet (5 mg total) by mouth daily as needed for moderate constipation. 30 tablet 0   Continuous Blood Gluc Receiver (FREESTYLE LIBRE READER) DEVI Monitor blood sugar  for 14 days  and repeat as indicated 1 each PRN   Continuous Blood Gluc Sensor (FREESTYLE LIBRE 14 DAY SENSOR) MISC MONITOR BLOOD SUGAR CONTINUOUS FOR 14 DAYS AT A TIME 1 each 5   dapagliflozin propanediol (FARXIGA) 5 MG TABS tablet Take 1 tablet (5 mg total) by mouth daily before breakfast. 30 tablet 1   docusate sodium (COLACE) 100 MG capsule Take 1 capsule (100 mg total) by mouth daily as needed for mild constipation. 10 capsule 0   methocarbamol (ROBAXIN) 500 MG tablet Take 1-2 tablets (500-1,000 mg total) by mouth every 8 (eight) hours as needed for muscle spasms. 60 tablet 0   olmesartan (BENICAR) 20 MG tablet TAKE ONE TABLET BY MOUTH DAILY 90 tablet 0   pantoprazole (PROTONIX) 40 MG tablet TAKE ONE TABLET BY MOUTH TWICE A DAY 180 tablet 0   rOPINIRole (REQUIP) 0.5 MG tablet Take 1 tablet (0.5 mg total) by mouth 3 (three) times daily. 270 tablet 1   rosuvastatin (CRESTOR) 10 MG tablet TAKE ONE TABLET BY MOUTH DAILY 90 tablet 0   SYNTHROID 112 MCG tablet TAKE ONE TABLET BY MOUTH EVERY MORNING BEFORE BREAKFAST 90 tablet 0   Blood Glucose Monitoring Suppl (FREESTYLE LITE) DEVI Use twice daily for glucose control 1  each 0   glucose blood (FREESTYLE LITE) test strip 1 each by Other route 2 (two) times daily. 100 each 12   No facility-administered medications prior to visit.    ROS Review of Systems  Constitutional:  Positive for unexpected weight change (wt gain). Negative for appetite change, chills, diaphoresis and fatigue.  Eyes:  Positive for visual disturbance.  Respiratory:  Positive for apnea and shortness of breath. Negative for cough and wheezing.   Cardiovascular:  Negative for chest pain, palpitations and leg swelling.  Gastrointestinal:  Negative for abdominal pain, constipation, diarrhea, nausea and vomiting.  Genitourinary: Negative.  Negative for difficulty urinating.  Musculoskeletal:  Negative for arthralgias and myalgias.  Skin: Negative.   Neurological:  Positive for headaches. Negative for dizziness, weakness, light-headedness and numbness.  Hematological:  Negative for adenopathy. Does not bruise/bleed easily.  Psychiatric/Behavioral: Negative.     Objective:  BP (!) 168/90 (BP Location: Right Arm, Patient Position: Sitting, Cuff Size: Large)   Pulse (!) 55   Temp 98.1 F (36.7 C) (Oral)   Ht '4\' 11"'$  (1.499 m)   Wt 177 lb (80.3 kg)   SpO2 95%   BMI 35.75 kg/m   BP Readings from Last 3 Encounters:  06/30/21 (!) 168/90  06/27/21 (!) 156/81  01/21/21 130/82    Wt Readings from Last 3 Encounters:  06/30/21  177 lb (80.3 kg)  06/26/21 166 lb (75.3 kg)  01/21/21 166 lb 6.4 oz (75.5 kg)    Physical Exam Constitutional:      Appearance: She is obese. She is ill-appearing.  HENT:     Nose: Nose normal.     Mouth/Throat:     Pharynx: Oropharynx is clear.  Eyes:     General: No scleral icterus.    Conjunctiva/sclera: Conjunctivae normal.  Cardiovascular:     Rate and Rhythm: Bradycardia present.     Pulses: Normal pulses.     Heart sounds: No murmur heard.   No gallop.  Pulmonary:     Effort: Pulmonary effort is normal.     Breath sounds: No stridor. No  wheezing, rhonchi or rales.  Abdominal:     General: Abdomen is protuberant.     Palpations: There is no mass.     Tenderness: There is no abdominal tenderness. There is no guarding.     Hernia: No hernia is present.  Musculoskeletal:        General: Normal range of motion.     Cervical back: Neck supple.     Right lower leg: Edema (trace pitting) present.     Left lower leg: Edema (trace pitting) present.  Skin:    General: Skin is warm and dry.     Findings: No lesion.  Neurological:     General: No focal deficit present.     Mental Status: She is alert. Mental status is at baseline.  Psychiatric:        Mood and Affect: Mood normal.        Behavior: Behavior normal.    Lab Results  Component Value Date   WBC 5.0 06/27/2021   HGB 14.2 06/27/2021   HCT 42.6 06/27/2021   PLT 249 06/27/2021   GLUCOSE 129 (H) 06/27/2021   CHOL 145 01/21/2021   TRIG 160.0 (H) 01/21/2021   HDL 53.80 01/21/2021   LDLDIRECT 142.0 01/28/2016   LDLCALC 59 01/21/2021   ALT 13 10/05/2020   AST 14 (L) 10/05/2020   NA 140 06/27/2021   K 4.1 06/27/2021   CL 103 06/27/2021   CREATININE 0.74 06/27/2021   BUN 21 06/27/2021   CO2 29 06/27/2021   TSH 3.48 06/30/2021   INR 1.12 03/17/2017   HGBA1C 7.0 (H) 07/02/2021   MICROALBUR 1.2 06/30/2021    No results found.  Assessment & Plan:   [name] was seen today for hypertension and diabetes.  Diagnoses and all orders for this visit:  Type II diabetes mellitus with manifestations ([place])- Her A1C is up to 7.0%, will add a GLP-1 agonist to the SGLT2- inh. -     Microalbumin / creatinine urine ratio; Future -     Microalbumin / creatinine urine ratio -     Hemoglobin A1c; Future -     Hemoglobin A1c -     Semaglutide (RYBELSUS) 3 MG TABS; Take 3 mg by mouth daily.  Primary hypertension- Will screen for secondary causes and end organ damage. Will add a thiazide to the ARB. -     TSH; Future -     Urinalysis, Routine w reflex microscopic;  Future -     Aldosterone + renin activity w/ ratio; Future -     indapamide (LOZOL) 1.25 MG tablet; Take 1 tablet (1.25 mg total) by mouth daily. -     Aldosterone + renin activity w/ ratio -     Urinalysis, Routine w reflex microscopic -  TSH  DOE (dyspnea on exertion)- Labs are reassuring. -     Troponin I (High Sensitivity); Future -     Brain natriuretic peptide; Future -     Brain natriuretic peptide -     Troponin I (High Sensitivity)   I am having [name] start on indapamide and Rybelsus. I am also having her maintain her acetaminophen, methocarbamol, bisacodyl, docusate sodium, FreeStyle Libre 14 Day Sensor, [employer] Reader, dapagliflozin propanediol, pantoprazole, olmesartan, rosuvastatin, Synthroid, and rOPINIRole.  Meds ordered this encounter  Medications   indapamide (LOZOL) 1.25 MG tablet    Sig: Take 1 tablet (1.25 mg total) by mouth daily.    Dispense:  90 tablet    Refill:  0   Semaglutide (RYBELSUS) 3 MG TABS    Sig: Take 3 mg by mouth daily.    Dispense:  30 tablet    Refill:  0     Follow-up: Return in about 3 months (around 09/30/2021).  Scarlette Calico, MD

## 2021-06-30 NOTE — Patient Instructions (Signed)
Hypertension, Adult High blood pressure (hypertension) is when the force of blood pumping through the arteries is too strong. The arteries are the blood vessels that carry blood from the heart throughout the body. Hypertension forces the heart to work harder to pump blood and may cause arteries to become narrow or stiff. Untreated or uncontrolled hypertension can lead to a heart attack, heart failure, a stroke, kidney disease, and other problems. A blood pressure reading consists of a higher number over a lower number. Ideally, your blood pressure should be below 120/80. The first ("top") number is called the systolic pressure. It is a measure of the pressure in your arteries as your heart beats. The second ("bottom") number is called the diastolic pressure. It is a measure of the pressure in your arteries as the heart relaxes. What are the causes? The exact cause of this condition is not known. There are some conditions that result in high blood pressure. What increases the risk? Certain factors may make you more likely to develop high blood pressure. Some of these risk factors are under your control, including: Smoking. Not getting enough exercise or physical activity. Being overweight. Having too much fat, sugar, calories, or salt (sodium) in your diet. Drinking too much alcohol. Other risk factors include: Having a personal history of heart disease, diabetes, high cholesterol, or kidney disease. Stress. Having a family history of high blood pressure and high cholesterol. Having obstructive sleep apnea. Age. The risk increases with age. What are the signs or symptoms? High blood pressure may not cause symptoms. Very high blood pressure (hypertensive crisis) may cause: Headache. Fast or irregular heartbeats (palpitations). Shortness of breath. Nosebleed. Nausea and vomiting. Vision changes. Severe chest pain, dizziness, and seizures. How is this diagnosed? This condition is diagnosed by  measuring your blood pressure while you are seated, with your arm resting on a flat surface, your legs uncrossed, and your feet flat on the floor. The cuff of the blood pressure monitor will be placed directly against the skin of your upper arm at the level of your heart. Blood pressure should be measured at least twice using the same arm. Certain conditions can cause a difference in blood pressure between your right and left arms. If you have a high blood pressure reading during one visit or you have normal blood pressure with other risk factors, you may be asked to: Return on a different day to have your blood pressure checked again. Monitor your blood pressure at home for 1 week or longer. If you are diagnosed with hypertension, you may have other blood or imaging tests to help your health care provider understand your overall risk for other conditions. How is this treated? This condition is treated by making healthy lifestyle changes, such as eating healthy foods, exercising more, and reducing your alcohol intake. You may be referred for counseling on a healthy diet and physical activity. Your health care provider may prescribe medicine if lifestyle changes are not enough to get your blood pressure under control and if: Your systolic blood pressure is above 130. Your diastolic blood pressure is above 80. Your personal target blood pressure may vary depending on your medical conditions, your age, and other factors. Follow these instructions at home: Eating and drinking  Eat a diet that is high in fiber and potassium, and low in sodium, added sugar, and fat. An example of this eating plan is called the DASH diet. DASH stands for Dietary Approaches to Stop Hypertension. To eat this way: Eat   plenty of fresh fruits and vegetables. Try to fill one half of your plate at each meal with fruits and vegetables. Eat whole grains, such as whole-wheat pasta, brown rice, or whole-grain bread. Fill about one  fourth of your plate with whole grains. Eat or drink low-fat dairy products, such as skim milk or low-fat yogurt. Avoid fatty cuts of meat, processed or cured meats, and poultry with skin. Fill about one fourth of your plate with lean proteins, such as fish, chicken without skin, beans, eggs, or tofu. Avoid pre-made and processed foods. These tend to be higher in sodium, added sugar, and fat. Reduce your daily sodium intake. Many people with hypertension should eat less than 1,500 mg of sodium a day. Do not drink alcohol if: Your health care provider tells you not to drink. You are pregnant, may be pregnant, or are planning to become pregnant. If you drink alcohol: Limit how much you have to: 0-1 drink a day for women. 0-2 drinks a day for men. Know how much alcohol is in your drink. In the [place] one drink equals one 12 oz bottle of beer (355 mL), one 5 oz glass of wine (148 mL), or one 1 oz glass of hard liquor (44 mL). Lifestyle  Work with your health care provider to maintain a healthy body weight or to lose weight. Ask what an ideal weight is for you. Get at least 30 minutes of exercise that causes your heart to beat faster (aerobic exercise) most days of the week. Activities may include walking, swimming, or biking. Include exercise to strengthen your muscles (resistance exercise), such as Pilates or lifting weights, as part of your weekly exercise routine. Try to do these types of exercises for 30 minutes at least 3 days a week. Do not use any products that contain nicotine or tobacco. These products include cigarettes, chewing tobacco, and vaping devices, such as e-cigarettes. If you need help quitting, ask your health care provider. Monitor your blood pressure at home as told by your health care provider. Keep all follow-up visits. This is important. Medicines Take over-the-counter and prescription medicines only as told by your health care provider. Follow directions carefully. Blood  pressure medicines must be taken as prescribed. Do not skip doses of blood pressure medicine. Doing this puts you at risk for problems and can make the medicine less effective. Ask your health care provider about side effects or reactions to medicines that you should watch for. Contact a health care provider if you: Think you are having a reaction to a medicine you are taking. Have headaches that keep coming back (recurring). Feel dizzy. Have swelling in your ankles. Have trouble with your vision. Get help right away if you: Develop a severe headache or confusion. Have unusual weakness or numbness. Feel faint. Have severe pain in your chest or abdomen. Vomit repeatedly. Have trouble breathing. These symptoms may be an emergency. Get help right away. Call 911. Do not wait to see if the symptoms will go away. Do not drive yourself to the hospital. Summary Hypertension is when the force of blood pumping through your arteries is too strong. If this condition is not controlled, it may put you at risk for serious complications. Your personal target blood pressure may vary depending on your medical conditions, your age, and other factors. For most people, a normal blood pressure is less than 120/80. Hypertension is treated with lifestyle changes, medicines, or a combination of both. Lifestyle changes include losing weight, eating a healthy,   low-sodium diet, exercising more, and limiting alcohol. This information is not intended to replace advice given to you by your health care provider. Make sure you discuss any questions you have with your health care provider. Document Revised: 11/19/2020 Document Reviewed: 11/19/2020 Elsevier Patient Education  2023 Elsevier Inc.  

## 2021-06-30 NOTE — Telephone Encounter (Signed)
Pt called stating that she was seen in the hospital on Friday and wanted to sched a f/u with her PCP. I let the pt know that Dr. Regis Bill is out of the office for the week and that I could sched her with one of our other providers. She declined and wanted to be sched at [facility] with Dr. Quay Burow or Dr. Ronnald Ramp.   I let her know that since it was a Hosp. f/u that I would need to sched her within our practice. She declined again and stated she would just give her daughter a call who works at [employer] to sched her with one of the providers there.   Pt stated she was TOC to [facility] anyway because she heard Dr. Regis Bill was retiring.   FYI.

## 2021-07-01 LAB — BRAIN NATRIURETIC PEPTIDE: Pro B Natriuretic peptide (BNP): 35 pg/mL (ref 0.0–100.0)

## 2021-07-02 LAB — HEMOGLOBIN A1C: Hgb A1c MFr Bld: 7 % — ABNORMAL HIGH (ref 4.6–6.5)

## 2021-07-02 MED ORDER — RYBELSUS 3 MG PO TABS: 3.0000 mg | ORAL_TABLET | Freq: Every day | ORAL | 0 refills | Status: DC

## 2021-07-03 NOTE — Telephone Encounter (Signed)
Rec'd fax PA Key # [id number] pt saw Dr. Ronnald Ramp on 6/58/23 for her Diabetes. MD rx Rybelsus tried to do PA rec'd msg stating "The patient currently has access to the requested medication and a Prior Authorization is not needed for the patient/medication" closed PA...lmb

## 2021-07-04 ENCOUNTER — Telehealth: Payer: Self-pay | Admitting: Internal Medicine

## 2021-07-04 NOTE — Telephone Encounter (Signed)
Paitent was prescribed Rebelsus and she can not afford this medication - was going to cost patient over $500 with insurance - She is going to have to have something else sent in.  Pharm[facility]:  [facility], [place] Patient was seen by Dr. Ronnald Ramp on Monday 07/01/2021

## 2021-07-07 ENCOUNTER — Telehealth: Payer: Self-pay

## 2021-07-07 ENCOUNTER — Other Ambulatory Visit: Payer: Self-pay | Admitting: Internal Medicine

## 2021-07-07 DIAGNOSIS — E118 Type 2 diabetes mellitus with unspecified complications: Secondary | ICD-10-CM

## 2021-07-07 MED ORDER — DAPAGLIFLOZIN PROPANEDIOL 10 MG PO TABS: 10.0000 mg | ORAL_TABLET | Freq: Every day | ORAL | 1 refills | Status: DC

## 2021-07-07 MED ORDER — METFORMIN HCL ER 750 MG PO TB24: 750.0000 mg | ORAL_TABLET | Freq: Every day | ORAL | 0 refills | Status: DC

## 2021-07-07 NOTE — Telephone Encounter (Addendum)
I called the pt to advise her of Dr. Ronnald Ramp recommendation to Stop rybelsus, Start metformin, Increase farxiga to 10 mg qd.  Pt states that she had gotten [place] approved through [facility] but the local pharmacy was very expensive the first time she received it. Pt will give [place] a call to let us know if it is still super expensive.     FYI  Pt is requesting the Rx for  farxiga '10mg'$    Pharmacy: [facility], [place] [address]

## 2021-07-07 NOTE — Telephone Encounter (Signed)
Pt is calling stating that the Semaglutide (RYBELSUS) 3 MG TABS  Dr. Ronnald Ramp prescribed is over 500$ with insurance and was wondering if there is a saving card or if something else could be prescribed?  Please advise

## 2021-07-07 NOTE — Telephone Encounter (Signed)
Pt is asking to [facility] to Dr. Ronnald Ramp since Dr. Regis Bill is retiring.Pt was seen by Dr. Ronnald Ramp and those that he would be a great fit for her.  I advised the pt upon approval I would call her back to get her scheduled.  Please advise

## 2021-07-08 DIAGNOSIS — Z800 Family history of malignant neoplasm of digestive organs: Secondary | ICD-10-CM | POA: Diagnosis not present

## 2021-07-08 DIAGNOSIS — K5730 Diverticulosis of large intestine without perforation or abscess without bleeding: Secondary | ICD-10-CM | POA: Diagnosis not present

## 2021-07-08 DIAGNOSIS — D122 Benign neoplasm of ascending colon: Secondary | ICD-10-CM | POA: Diagnosis not present

## 2021-07-08 DIAGNOSIS — K635 Polyp of colon: Secondary | ICD-10-CM | POA: Diagnosis not present

## 2021-07-08 DIAGNOSIS — Z9049 Acquired absence of other specified parts of digestive tract: Secondary | ICD-10-CM | POA: Diagnosis not present

## 2021-07-08 DIAGNOSIS — D123 Benign neoplasm of transverse colon: Secondary | ICD-10-CM | POA: Diagnosis not present

## 2021-07-08 DIAGNOSIS — Z1211 Encounter for screening for malignant neoplasm of colon: Secondary | ICD-10-CM | POA: Diagnosis not present

## 2021-07-08 DIAGNOSIS — Z86010 Personal history of colonic polyps: Secondary | ICD-10-CM | POA: Diagnosis not present

## 2021-07-08 NOTE — Telephone Encounter (Signed)
[place] with me .  FYI, I am not retiring yet but do have a more limited schedule.

## 2021-07-09 ENCOUNTER — Other Ambulatory Visit: Payer: Self-pay | Admitting: Internal Medicine

## 2021-07-09 DIAGNOSIS — G2581 Restless legs syndrome: Secondary | ICD-10-CM

## 2021-07-10 ENCOUNTER — Other Ambulatory Visit: Payer: Self-pay | Admitting: Internal Medicine

## 2021-07-14 LAB — ALDOSTERONE + RENIN ACTIVITY W/ RATIO
ALDO / PRA Ratio: 2.5 Ratio (ref 0.9–28.9)
Aldosterone: 1 ng/dL
Renin Activity: 0.4 ng/mL/h (ref 0.25–5.82)

## 2021-07-17 ENCOUNTER — Ambulatory Visit: Payer: Medicare Other | Admitting: Nurse Practitioner

## 2021-07-21 ENCOUNTER — Encounter: Payer: Self-pay | Admitting: Internal Medicine

## 2021-07-21 ENCOUNTER — Ambulatory Visit (INDEPENDENT_AMBULATORY_CARE_PROVIDER_SITE_OTHER): Payer: Medicare Other

## 2021-07-21 ENCOUNTER — Ambulatory Visit (INDEPENDENT_AMBULATORY_CARE_PROVIDER_SITE_OTHER): Payer: Medicare Other | Admitting: Internal Medicine

## 2021-07-21 VITALS — BP 114/68 | HR 68 | Temp 98.7°F | Ht 59.0 in | Wt 174.0 lb

## 2021-07-21 DIAGNOSIS — R0609 Other forms of dyspnea: Secondary | ICD-10-CM

## 2021-07-21 DIAGNOSIS — R002 Palpitations: Secondary | ICD-10-CM

## 2021-07-21 DIAGNOSIS — I10 Essential (primary) hypertension: Secondary | ICD-10-CM

## 2021-07-21 MED ORDER — ASPIRIN 81 MG PO TBEC: 81.0000 mg | DELAYED_RELEASE_TABLET | Freq: Every day | ORAL | 12 refills | Status: DC

## 2021-07-21 NOTE — H&P (View-Only) (Signed)
Cardiology Office Note:    Date:  07/22/2021   ID:  [name], DOB 1950/10/10, MRN [id number]  PCP:  Janith Lima, MD  [facility] Providers Cardiologist:  Jenkins Rouge, MD    Referring MD: Binnie Rail, MD   Chief Complaint:  Shortness of Breath and Chest Pain    Patient Profile: Chest pain  Hypertension  Hyperlipidemia  Hypothyroidism  Diverticulitis s/p partial colectomy in 2019 Diabetes mellitus GERD   Prior CV Studies: GATED SPECT MYO PERF W/LEXISCAN STRESS 1D 02/07/2018 EF 63, normal perfusion, low risk   ECHO COMPLETE WO IMAGING ENHANCING AGENT 03/22/2017 EF 60-65, PASP 33    CPET 03/06/2013 Conclusion: Exercise testing with gas exchange demonstrates a  normal functional capacity when compared to matched sedentary  norms. There was not a clear circulatory or ventilatory  limitation to the exercise.   History of Present Illness:   [name] is a 71 y.o. female with the above problem list.  She was last seen by Dr. Johnsie Cancel in Jan 2020.   She was seen in the ED on 06/27/21 for elevated BP.  She saw her PCP on Monday 07/21/21 for palpitations and shortness of breath.  A 3 day Zio was placed and she was referred to Cardiology.  She is here alone.  Over the past month, she has noted fluctuations in her heart rate.  Her heart rate has been as high as 116.  She also notes chest discomfort.  She has a fairly constant heaviness in her chest.  This clearly gets worse with exertion.  She notes associated left arm discomfort as well as shortness of breath.  She has not had syncope but has been lightheaded and felt near syncopal at times.  She notes decreased exercise tolerance and dyspnea with exertion.  Her symptoms have gradually worsened over the past month.  She has had to sleep on an incline recently.  She has have chronic lower extremity edema.  She was recently placed on indapamide for her blood pressure.  This has improved her swelling since that time.    Past  Medical History:  Diagnosis Date   Acute pyelonephritis 05/02/2012   Asthma    Diverticulitis 03/24/2017   see report [facility] [place] [facility]   Dyspnea    nl PFTs, and echo   GERD (gastroesophageal reflux disease)    Headache(784.0)    Heart murmur    History of kidney stones    IBS (irritable bowel syndrome)    Nephrolithiasis    Pre-diabetes 12/15   Recurrent oral ulcers    Thyroid goiter    I 131 rx and then  total throidectomy 2005 [facility]   Current Medications: Current Meds  Medication Sig   acetaminophen (TYLENOL) 500 MG tablet Take 1,000 mg by mouth 3 (three) times daily.   aspirin EC 81 MG tablet Take 1 tablet (81 mg total) by mouth daily. Swallow whole.   Blood Glucose Monitoring Suppl (FREESTYLE LITE) w/Device KIT USE TWO TIMES A DAY TO TEST GLUCOSE CONTROL   Continuous Blood Gluc Receiver (FREESTYLE LIBRE READER) DEVI Monitor blood sugar  for 14 days  and repeat as indicated   Continuous Blood Gluc Sensor (FREESTYLE LIBRE 14 DAY SENSOR) MISC MONITOR BLOOD SUGAR CONTINUOUS FOR 14 DAYS AT A TIME   dapagliflozin propanediol (FARXIGA) 10 MG TABS tablet Take 1 tablet (10 mg total) by mouth daily before breakfast.   FREESTYLE LITE test strip USE ONE STRIP TO TEST TWICE A DAY   indapamide (  LOZOL) 1.25 MG tablet Take 1 tablet (1.25 mg total) by mouth daily.   metFORMIN (GLUCOPHAGE-XR) 750 MG 24 hr tablet Take 1 tablet (750 mg total) by mouth daily with breakfast.   nitroGLYCERIN (NITROSTAT) 0.4 MG SL tablet Place 1 tablet (0.4 mg total) under the tongue every 5 (five) minutes as needed for chest pain.   olmesartan (BENICAR) 20 MG tablet TAKE ONE TABLET BY MOUTH DAILY   pantoprazole (PROTONIX) 40 MG tablet TAKE ONE TABLET BY MOUTH TWICE A DAY   rOPINIRole (REQUIP) 0.5 MG tablet TAKE 1 OR 2 TABLETS BY MOUTH DAILY AT NOON AND AT 5PM , THEN 3 TABLETS EVERY NIGHT AT BEDTIME (Patient taking differently: Take 1-1.5 mg by mouth See admin instructions. Take 1 mg in the morning and lunch,  and 1.5 mg at bedtime)   rosuvastatin (CRESTOR) 10 MG tablet TAKE ONE TABLET BY MOUTH DAILY   SYNTHROID 112 MCG tablet TAKE ONE TABLET BY MOUTH EVERY MORNING BEFORE BREAKFAST   [DISCONTINUED] bisacodyl (DULCOLAX) 5 MG EC tablet Take 1 tablet (5 mg total) by mouth daily as needed for moderate constipation. (Patient not taking: Reported on 07/22/2021)   [DISCONTINUED] docusate sodium (COLACE) 100 MG capsule Take 1 capsule (100 mg total) by mouth daily as needed for mild constipation. (Patient not taking: Reported on 07/22/2021)   [DISCONTINUED] methocarbamol (ROBAXIN) 500 MG tablet Take 1-2 tablets (500-1,000 mg total) by mouth every 8 (eight) hours as needed for muscle spasms. (Patient not taking: Reported on 07/22/2021)    Allergies:   Hydralazine, Ciprofloxacin, Propofol, and Ritalin [methylphenidate hcl]   Social History   Tobacco Use   Smoking status: Never   Smokeless tobacco: Never  Vaping Use   Vaping Use: Never used  Substance Use Topics   Alcohol use: Yes    Comment: seldom   Drug use: No    Family Hx: The patient's family history includes Aneurysm in her father; Arthritis in an other family member; COPD in her sister; Cancer in her mother and another family member; Coronary artery disease in her father and sister; Diabetes in an other family member; Heart disease in an other family member; Hyperlipidemia in an other family member; Hypertension in an other family member; Stroke in an other family member.  Review of Systems  Constitutional: Negative for fever.  Cardiovascular:  Positive for leg swelling.  Respiratory:  Positive for cough.   Gastrointestinal:  Negative for hematochezia and melena.  Genitourinary:  Negative for hematuria.     EKGs/Labs/Other Test Reviewed:    EKG:  EKG is  ordered today.  The ekg ordered today demonstrates NSR, HR 69, normal axis, no ST-T wave changes, no change from prior tracing  Recent Labs: 10/05/2020: ALT 13 12/26/2020: Magnesium  1.8 06/30/2021: TSH 3.48 07/22/2021: BUN 22; Creatinine, Ser 0.97; Hemoglobin WILL FOLLOW; NT-Pro BNP WILL FOLLOW; Platelets WILL FOLLOW; Potassium 4.2; Sodium 141   Recent Lipid Panel Recent Labs    01/21/21 0851  CHOL 145  TRIG 160.0*  HDL 53.80  VLDL 32.0  LDLCALC 59     Risk Assessment/Calculations/Metrics:              Physical Exam:    VS:  BP 122/70   Pulse 69   Ht _0  (1.499 m)   Wt 174 lb 9.6 oz (79.2 kg)   SpO2 95%   BMI 35.26 kg/m     Wt Readings from Last 3 Encounters:  07/22/21 174 lb 9.6 oz (79.2 kg)  07/21/21 174 lb (  78.9 kg)  06/30/21 177 lb (80.3 kg)    Constitutional:      Appearance: Healthy appearance. Not in distress.  Neck:     Vascular: No JVR. JVD normal.  Pulmonary:     Effort: Pulmonary effort is normal.     Breath sounds: No wheezing. No rales.  Cardiovascular:     Normal rate. Regular rhythm. Normal S1. Normal S2.      Murmurs: There is a grade 1/6 systolic murmur at the URSB.  Edema:    Peripheral edema absent.  Abdominal:     General: There is no distension.     Palpations: Abdomen is soft.  Skin:    General: Skin is warm and dry.  Neurological:     Mental Status: Alert and oriented to person, place and time.         ASSESSMENT & PLAN:   Angina pectoris ([place]) She presents with symptoms of chest pain and shortness of breath that sound c/w unstable angina.  Her EKG does not demonstrate any acute changes.  She notes orthopnea but she does not have physical findings of volume excess.  She has several risk factors including diabetes mellitus, hypertension, hyperlipidemia, FHx of CAD.  We discussed further workup to with stress testing vs CCTA vs cardiac catheterization.  I favor cardiac catheterization given her symptoms that have worsened over the past 3-4 weeks.  I discussed this with Dr. Radford Pax (attending MD) who agreed.  Give her shortness of breath, we will also do a right sided heart cath.   Arrange R&L cardiac catheterization   Continue aspirin 81 mg daily, rosuvastatin 10 mg daily Nitroglycerin as needed  Palpitations She has had fluctuations in her heart rate since this all began.  I reviewed her EKGs from her Apple Watch.  There were no episodes of atrial fibrillation.  The highest her heart rate got was 116.  She has not had syncope but has had near syncope.  She does not have any evidence of preexcitation on electrocardiogram.  Her 3-day ZIO monitor is currently pending.  TSH on 06/30/2021 was 3.48.  Await findings from cardiac monitor.  Primary hypertension Blood pressure has been uncontrolled.  She has felt better since starting on indapamide.  Blood pressures are also better controlled.  Continue olmesartan 20 mg daily, indapamide 1.25 mg daily.  Pure hypercholesterolemia LDL in December 2022 was optimal at 59.  Continue rosuvastatin 10 mg daily.        Shared Decision Making/Informed Consent The risks [stroke (1 in 1000), death (1 in 1000), kidney failure [usually temporary] (1 in 500), bleeding (1 in 200), allergic reaction [possibly serious] (1 in 200)], benefits (diagnostic support and management of coronary artery disease) and alternatives of a cardiac catheterization were discussed in detail with Ms. Knights and she is willing to proceed.   Dispo:  Return for Post Procedure Follow Up with Dr. Johnsie Cancel.   Medication Adjustments/Labs and Tests Ordered: Current medicines are reviewed at length with the patient today.  Concerns regarding medicines are outlined above.  Tests Ordered: Orders Placed This Encounter  Procedures   Pro b natriuretic peptide (BNP)   Basic metabolic panel   CBC   EKG 12-Lead   Medication Changes: Meds ordered this encounter  Medications   nitroGLYCERIN (NITROSTAT) 0.4 MG SL tablet    Sig: Place 1 tablet (0.4 mg total) under the tongue every 5 (five) minutes as needed for chest pain.    Dispense:  25 tablet    Refill:  3   Signed, Richardson Dopp, PA-C  07/22/2021 6:05 PM     [facility] [address], [place], [place]  [address] Phone: [phone]; Fax: [phone]

## 2021-07-21 NOTE — Progress Notes (Addendum)
Cardiology Office Note:    Date:  07/22/2021   ID:  [name], DOB 03/01/1950, MRN [id number]  PCP:  Jones, Thomas L, MD  [facility] Providers Cardiologist:  Peter Nishan, MD    Referring MD: Burns, Stacy J, MD   Chief Complaint:  Shortness of Breath and Chest Pain    Patient Profile: Chest pain  Hypertension  Hyperlipidemia  Hypothyroidism  Diverticulitis s/p partial colectomy in 2019 Diabetes mellitus GERD   Prior CV Studies: GATED SPECT MYO PERF W/LEXISCAN STRESS 1D 02/07/2018 EF 63, normal perfusion, low risk   ECHO COMPLETE WO IMAGING ENHANCING AGENT 03/22/2017 EF 60-65, PASP 33    CPET 03/06/2013 Conclusion: Exercise testing with gas exchange demonstrates a  normal functional capacity when compared to matched sedentary  norms. There was not a clear circulatory or ventilatory  limitation to the exercise.   History of Present Illness:   [name] is a 71 y.o. female with the above problem list.  She was last seen by Dr. Nishan in Jan 2020.   She was seen in the ED on 06/27/21 for elevated BP.  She saw her PCP on Monday 07/21/21 for palpitations and shortness of breath.  A 3 day Zio was placed and she was referred to Cardiology.  She is here alone.  Over the past month, she has noted fluctuations in her heart rate.  Her heart rate has been as high as 116.  She also notes chest discomfort.  She has a fairly constant heaviness in her chest.  This clearly gets worse with exertion.  She notes associated left arm discomfort as well as shortness of breath.  She has not had syncope but has been lightheaded and felt near syncopal at times.  She notes decreased exercise tolerance and dyspnea with exertion.  Her symptoms have gradually worsened over the past month.  She has had to sleep on an incline recently.  She has have chronic lower extremity edema.  She was recently placed on indapamide for her blood pressure.  This has improved her swelling since that time.    Past  Medical History:  Diagnosis Date   Acute pyelonephritis 05/02/2012   Asthma    Diverticulitis 03/24/2017   see report [facility]   Dyspnea    nl PFTs, and echo   GERD (gastroesophageal reflux disease)    Headache(784.0)    Heart murmur    History of kidney stones    IBS (irritable bowel syndrome)    Nephrolithiasis    Pre-diabetes 12/15   Recurrent oral ulcers    Thyroid goiter    I 131 rx and then  total throidectomy 2005 [facility]   Current Medications: Current Meds  Medication Sig   acetaminophen (TYLENOL) 500 MG tablet Take 1,000 mg by mouth 3 (three) times daily.   aspirin EC 81 MG tablet Take 1 tablet (81 mg total) by mouth daily. Swallow whole.   Blood Glucose Monitoring Suppl (FREESTYLE LITE) w/Device KIT USE TWO TIMES A DAY TO TEST GLUCOSE CONTROL   Continuous Blood Gluc Receiver (FREESTYLE LIBRE READER) DEVI Monitor blood sugar  for 14 days  and repeat as indicated   Continuous Blood Gluc Sensor (FREESTYLE LIBRE 14 DAY SENSOR) MISC MONITOR BLOOD SUGAR CONTINUOUS FOR 14 DAYS AT A TIME   dapagliflozin propanediol (FARXIGA) 10 MG TABS tablet Take 1 tablet (10 mg total) by mouth daily before breakfast.   FREESTYLE LITE test strip USE ONE STRIP TO TEST TWICE A DAY   indapamide (  LOZOL) 1.25 MG tablet Take 1 tablet (1.25 mg total) by mouth daily.   metFORMIN (GLUCOPHAGE-XR) 750 MG 24 hr tablet Take 1 tablet (750 mg total) by mouth daily with breakfast.   nitroGLYCERIN (NITROSTAT) 0.4 MG SL tablet Place 1 tablet (0.4 mg total) under the tongue every 5 (five) minutes as needed for chest pain.   olmesartan (BENICAR) 20 MG tablet TAKE ONE TABLET BY MOUTH DAILY   pantoprazole (PROTONIX) 40 MG tablet TAKE ONE TABLET BY MOUTH TWICE A DAY   rOPINIRole (REQUIP) 0.5 MG tablet TAKE 1 OR 2 TABLETS BY MOUTH DAILY AT NOON AND AT 5PM , THEN 3 TABLETS EVERY NIGHT AT BEDTIME (Patient taking differently: Take 1-1.5 mg by mouth See admin instructions. Take 1 mg in the morning and lunch,  and 1.5 mg at bedtime)   rosuvastatin (CRESTOR) 10 MG tablet TAKE ONE TABLET BY MOUTH DAILY   SYNTHROID 112 MCG tablet TAKE ONE TABLET BY MOUTH EVERY MORNING BEFORE BREAKFAST   [DISCONTINUED] bisacodyl (DULCOLAX) 5 MG EC tablet Take 1 tablet (5 mg total) by mouth daily as needed for moderate constipation. (Patient not taking: Reported on 07/22/2021)   [DISCONTINUED] docusate sodium (COLACE) 100 MG capsule Take 1 capsule (100 mg total) by mouth daily as needed for mild constipation. (Patient not taking: Reported on 07/22/2021)   [DISCONTINUED] methocarbamol (ROBAXIN) 500 MG tablet Take 1-2 tablets (500-1,000 mg total) by mouth every 8 (eight) hours as needed for muscle spasms. (Patient not taking: Reported on 07/22/2021)    Allergies:   Hydralazine, Ciprofloxacin, Propofol, and Ritalin [methylphenidate hcl]   Social History   Tobacco Use   Smoking status: Never   Smokeless tobacco: Never  Vaping Use   Vaping Use: Never used  Substance Use Topics   Alcohol use: Yes    Comment: seldom   Drug use: No    Family Hx: The patient's family history includes Aneurysm in her father; Arthritis in an other family member; COPD in her sister; Cancer in her mother and another family member; Coronary artery disease in her father and sister; Diabetes in an other family member; Heart disease in an other family member; Hyperlipidemia in an other family member; Hypertension in an other family member; Stroke in an other family member.  Review of Systems  Constitutional: Negative for fever.  Cardiovascular:  Positive for leg swelling.  Respiratory:  Positive for cough.   Gastrointestinal:  Negative for hematochezia and melena.  Genitourinary:  Negative for hematuria.     EKGs/Labs/Other Test Reviewed:    EKG:  EKG is  ordered today.  The ekg ordered today demonstrates NSR, HR 69, normal axis, no ST-T wave changes, no change from prior tracing  Recent Labs: 10/05/2020: ALT 13 12/26/2020: Magnesium  1.8 06/30/2021: TSH 3.48 07/22/2021: BUN 22; Creatinine, Ser 0.97; Hemoglobin WILL FOLLOW; NT-Pro BNP WILL FOLLOW; Platelets WILL FOLLOW; Potassium 4.2; Sodium 141   Recent Lipid Panel Recent Labs    01/21/21 0851  CHOL 145  TRIG 160.0*  HDL 53.80  VLDL 32.0  LDLCALC 59     Risk Assessment/Calculations/Metrics:              Physical Exam:    VS:  BP 122/70   Pulse 69   Ht 4' 11" (1.499 m)   Wt 174 lb 9.6 oz (79.2 kg)   SpO2 95%   BMI 35.26 kg/m     Wt Readings from Last 3 Encounters:  07/22/21 174 lb 9.6 oz (79.2 kg)  07/21/21 174 lb (  78.9 kg)  06/30/21 177 lb (80.3 kg)    Constitutional:      Appearance: Healthy appearance. Not in distress.  Neck:     Vascular: No JVR. JVD normal.  Pulmonary:     Effort: Pulmonary effort is normal.     Breath sounds: No wheezing. No rales.  Cardiovascular:     Normal rate. Regular rhythm. Normal S1. Normal S2.      Murmurs: There is a grade 1/6 systolic murmur at the URSB.  Edema:    Peripheral edema absent.  Abdominal:     General: There is no distension.     Palpations: Abdomen is soft.  Skin:    General: Skin is warm and dry.  Neurological:     Mental Status: Alert and oriented to person, place and time.         ASSESSMENT & PLAN:   Angina pectoris (HCC) She presents with symptoms of chest pain and shortness of breath that sound c/w unstable angina.  Her EKG does not demonstrate any acute changes.  She notes orthopnea but she does not have physical findings of volume excess.  She has several risk factors including diabetes mellitus, hypertension, hyperlipidemia, FHx of CAD.  We discussed further workup to with stress testing vs CCTA vs cardiac catheterization.  I favor cardiac catheterization given her symptoms that have worsened over the past 3-4 weeks.  I discussed this with Dr. Turner (attending MD) who agreed.  Give her shortness of breath, we will also do a right sided heart cath.   Arrange R&L cardiac catheterization   Continue aspirin 81 mg daily, rosuvastatin 10 mg daily Nitroglycerin as needed  Palpitations She has had fluctuations in her heart rate since this all began.  I reviewed her EKGs from her Apple Watch.  There were no episodes of atrial fibrillation.  The highest her heart rate got was 116.  She has not had syncope but has had near syncope.  She does not have any evidence of preexcitation on electrocardiogram.  Her 3-day ZIO monitor is currently pending.  TSH on 06/30/2021 was 3.48.  Await findings from cardiac monitor.  Primary hypertension Blood pressure has been uncontrolled.  She has felt better since starting on indapamide.  Blood pressures are also better controlled.  Continue olmesartan 20 mg daily, indapamide 1.25 mg daily.  Pure hypercholesterolemia LDL in December 2022 was optimal at 59.  Continue rosuvastatin 10 mg daily.        Shared Decision Making/Informed Consent The risks [stroke (1 in 1000), death (1 in 1000), kidney failure [usually temporary] (1 in 500), bleeding (1 in 200), allergic reaction [possibly serious] (1 in 200)], benefits (diagnostic support and management of coronary artery disease) and alternatives of a cardiac catheterization were discussed in detail with Ms. Blancett and she is willing to proceed.   Dispo:  Return for Post Procedure Follow Up with Dr. Nishan.   Medication Adjustments/Labs and Tests Ordered: Current medicines are reviewed at length with the patient today.  Concerns regarding medicines are outlined above.  Tests Ordered: Orders Placed This Encounter  Procedures   Pro b natriuretic peptide (BNP)   Basic metabolic panel   CBC   EKG 12-Lead   Medication Changes: Meds ordered this encounter  Medications   nitroGLYCERIN (NITROSTAT) 0.4 MG SL tablet    Sig: Place 1 tablet (0.4 mg total) under the tongue every 5 (five) minutes as needed for chest pain.    Dispense:  25 tablet    Refill:    3   Signed, Lillis Nuttle, PA-C  07/22/2021 6:05 PM     [facility] [address], [place], [place]  [address] Phone: [phone]; Fax: [phone]  

## 2021-07-21 NOTE — Progress Notes (Unsigned)
Enrolled for [employer] to mail a ZIO XT long term holter monitor to the patients address on file.   Dr. Nishan to read. 

## 2021-07-22 ENCOUNTER — Encounter: Payer: Self-pay | Admitting: Physician Assistant

## 2021-07-22 ENCOUNTER — Ambulatory Visit (INDEPENDENT_AMBULATORY_CARE_PROVIDER_SITE_OTHER): Payer: Medicare Other | Admitting: Physician Assistant

## 2021-07-22 VITALS — BP 122/70 | HR 69 | Ht 59.0 in | Wt 174.6 lb

## 2021-07-22 DIAGNOSIS — I10 Essential (primary) hypertension: Secondary | ICD-10-CM | POA: Diagnosis not present

## 2021-07-22 DIAGNOSIS — I209 Angina pectoris, unspecified: Secondary | ICD-10-CM | POA: Diagnosis not present

## 2021-07-22 DIAGNOSIS — R002 Palpitations: Secondary | ICD-10-CM | POA: Diagnosis not present

## 2021-07-22 DIAGNOSIS — E7800 Pure hypercholesterolemia, unspecified: Secondary | ICD-10-CM | POA: Diagnosis not present

## 2021-07-22 DIAGNOSIS — R0602 Shortness of breath: Secondary | ICD-10-CM | POA: Diagnosis not present

## 2021-07-22 DIAGNOSIS — R079 Chest pain, unspecified: Secondary | ICD-10-CM | POA: Insufficient documentation

## 2021-07-22 MED ORDER — NITROGLYCERIN 0.4 MG SL SUBL
0.4000 mg | SUBLINGUAL_TABLET | SUBLINGUAL | 3 refills | Status: AC | PRN
Start: 2021-07-22 — End: ?

## 2021-07-22 NOTE — Assessment & Plan Note (Signed)
She presents with symptoms of chest pain and shortness of breath that sound c/w unstable angina.  Her EKG does not demonstrate any acute changes.  She notes orthopnea but she does not have physical findings of volume excess.  She has several risk factors including diabetes mellitus, hypertension, hyperlipidemia, FHx of CAD.  We discussed further workup to with stress testing vs CCTA vs cardiac catheterization.  I favor cardiac catheterization given her symptoms that have worsened over the past 3-4 weeks.  I discussed this with Dr. Mayford Knife (attending MD) who agreed.  Give her shortness of breath, we will also do a right sided heart cath.    Arrange R&L cardiac catheterization   Continue aspirin 81 mg daily, rosuvastatin 10 mg daily  Nitroglycerin as needed

## 2021-07-22 NOTE — Assessment & Plan Note (Signed)
She has had fluctuations in her heart rate since this all began.  I reviewed her EKGs from her Apple Watch.  There were no episodes of atrial fibrillation.  The highest her heart rate got was 116.  She has not had syncope but has had near syncope.  She does not have any evidence of preexcitation on electrocardiogram.  Her 3-day ZIO monitor is currently pending.  TSH on 06/30/2021 was 3.48.  Await findings from cardiac monitor.

## 2021-07-23 ENCOUNTER — Telehealth: Payer: Self-pay | Admitting: *Deleted

## 2021-07-23 LAB — CBC
Hematocrit: 39.2 % (ref 34.0–46.6)
Hemoglobin: 13.4 g/dL (ref 11.1–15.9)
MCH: 31.7 pg (ref 26.6–33.0)
MCHC: 34.2 g/dL (ref 31.5–35.7)
MCV: 93 fL (ref 79–97)
Platelets: 249 10*3/uL (ref 150–450)
RBC: 4.23 x10E6/uL (ref 3.77–5.28)
RDW: 13.1 % (ref 11.7–15.4)
WBC: 4.9 10*3/uL (ref 3.4–10.8)

## 2021-07-23 LAB — BASIC METABOLIC PANEL
BUN/Creatinine Ratio: 23 (ref 12–28)
BUN: 22 mg/dL (ref 8–27)
CO2: 22 mmol/L (ref 20–29)
Calcium: 9.6 mg/dL (ref 8.7–10.3)
Chloride: 103 mmol/L (ref 96–106)
Creatinine, Ser: 0.97 mg/dL (ref 0.57–1.00)
Glucose: 106 mg/dL — ABNORMAL HIGH (ref 70–99)
Potassium: 4.2 mmol/L (ref 3.5–5.2)
Sodium: 141 mmol/L (ref 134–144)
eGFR: 62 mL/min/{1.73_m2} (ref >59)

## 2021-07-23 LAB — PRO B NATRIURETIC PEPTIDE: NT-Pro BNP: <36 pg/mL (ref 0–301)

## 2021-07-23 NOTE — Telephone Encounter (Signed)
Cardiac Catheterization scheduled at [facility] for: Thursday July 24, 2021 10:30 AM Arrival time and place: [facility] A at: 8:30 AM   Nothing to eat after midnight prior to procedure, clear liquids until 5 AM day of procedure.  Medication instructions: -Hold:  Metformin-day of procedure and 48 hours post procedure  Farxiga-AM of procedure  -Except hold medications usual morning medications can be taken with sips of water including aspirin 81 mg.  Confirmed patient has responsible adult to drive home post procedure and be with patient first 24 hours after arriving home.  Patient reports no new symptoms concerning for COVID-19 in the past 10 days.  Reviewed procedure instructions with patient.

## 2021-07-24 ENCOUNTER — Other Ambulatory Visit: Payer: Self-pay | Admitting: Internal Medicine

## 2021-07-24 ENCOUNTER — Other Ambulatory Visit: Payer: Self-pay

## 2021-07-24 ENCOUNTER — Encounter (HOSPITAL_COMMUNITY)
Admission: RE | Disposition: A | Discharge status: Home / Self Care | Payer: Self-pay | Source: Home / Self Care | Attending: Cardiovascular Disease

## 2021-07-24 ENCOUNTER — Ambulatory Visit (HOSPITAL_COMMUNITY)
Admission: RE | Admit: 2021-07-24 | Discharge: 2021-07-24 | Disposition: A | Discharge status: Home / Self Care | Payer: Medicare Other | Attending: Cardiovascular Disease | Admitting: Cardiovascular Disease

## 2021-07-24 DIAGNOSIS — I2721 Secondary pulmonary arterial hypertension: Secondary | ICD-10-CM | POA: Diagnosis not present

## 2021-07-24 DIAGNOSIS — R0602 Shortness of breath: Secondary | ICD-10-CM | POA: Diagnosis not present

## 2021-07-24 DIAGNOSIS — E7800 Pure hypercholesterolemia, unspecified: Secondary | ICD-10-CM | POA: Diagnosis not present

## 2021-07-24 DIAGNOSIS — I10 Essential (primary) hypertension: Secondary | ICD-10-CM | POA: Insufficient documentation

## 2021-07-24 DIAGNOSIS — R0609 Other forms of dyspnea: Secondary | ICD-10-CM | POA: Diagnosis not present

## 2021-07-24 DIAGNOSIS — G2581 Restless legs syndrome: Secondary | ICD-10-CM | POA: Insufficient documentation

## 2021-07-24 DIAGNOSIS — R079 Chest pain, unspecified: Secondary | ICD-10-CM

## 2021-07-24 DIAGNOSIS — I209 Angina pectoris, unspecified: Secondary | ICD-10-CM

## 2021-07-24 DIAGNOSIS — R002 Palpitations: Secondary | ICD-10-CM

## 2021-07-24 HISTORY — PX: RIGHT/LEFT HEART CATH AND CORONARY ANGIOGRAPHY: CATH118266

## 2021-07-24 LAB — POCT I-STAT EG7
Acid-Base Excess: 1 mmol/L (ref 0.0–2.0)
Acid-Base Excess: 3 mmol/L — ABNORMAL HIGH (ref 0.0–2.0)
Bicarbonate: 27.6 mmol/L (ref 20.0–28.0)
Bicarbonate: 29.1 mmol/L — ABNORMAL HIGH (ref 20.0–28.0)
Calcium, Ion: 1.23 mmol/L (ref 1.15–1.40)
Calcium, Ion: 1.26 mmol/L (ref 1.15–1.40)
HCT: 38 % (ref 36.0–46.0)
HCT: 40 % (ref 36.0–46.0)
Hemoglobin: 12.9 g/dL (ref 12.0–15.0)
Hemoglobin: 13.6 g/dL (ref 12.0–15.0)
O2 Saturation: 66 %
O2 Saturation: 64 %
Potassium: 4.1 mmol/L (ref 3.5–5.1)
Potassium: 4.2 mmol/L (ref 3.5–5.1)
Sodium: 141 mmol/L (ref 135–145)
Sodium: 141 mmol/L (ref 135–145)
TCO2: 29 mmol/L (ref 22–32)
TCO2: 31 mmol/L (ref 22–32)
pCO2, Ven: 48.9 mmHg (ref 44–60)
pCO2, Ven: 51.7 mmHg (ref 44–60)
pH, Ven: 7.36 (ref 7.25–7.43)
pH, Ven: 7.359 (ref 7.25–7.43)
pO2, Ven: 36 mmHg (ref 32–45)
pO2, Ven: 35 mmHg (ref 32–45)

## 2021-07-24 LAB — POCT I-STAT 7, (LYTES, BLD GAS, ICA,H+H)
Acid-base deficit: 1 mmol/L (ref 0.0–2.0)
Bicarbonate: 24.3 mmol/L (ref 20.0–28.0)
Calcium, Ion: 1.25 mmol/L (ref 1.15–1.40)
HCT: 40 % (ref 36.0–46.0)
Hemoglobin: 13.6 g/dL (ref 12.0–15.0)
O2 Saturation: 97 %
Potassium: 4.2 mmol/L (ref 3.5–5.1)
Sodium: 140 mmol/L (ref 135–145)
TCO2: 26 mmol/L (ref 22–32)
pCO2 arterial: 41 mmHg (ref 32–48)
pH, Arterial: 7.382 (ref 7.35–7.45)
pO2, Arterial: 88 mmHg (ref 83–108)

## 2021-07-24 LAB — GLUCOSE, CAPILLARY: Glucose-Capillary: 125 mg/dL — ABNORMAL HIGH (ref 70–99)

## 2021-07-24 SURGERY — RIGHT/LEFT HEART CATH AND CORONARY ANGIOGRAPHY
Anesthesia: LOCAL

## 2021-07-24 MED ORDER — HEPARIN (PORCINE) IN NACL 1000-0.9 UT/500ML-% IV SOLN
INTRAVENOUS | Status: DC | PRN
  Administered 2021-07-24 (×2): 500 mL

## 2021-07-24 MED ORDER — LIDOCAINE HCL (PF) 1 % IJ SOLN
INTRAMUSCULAR | Status: AC
  Filled 2021-07-24: qty 30

## 2021-07-24 MED ORDER — LIDOCAINE HCL (PF) 1 % IJ SOLN
INTRAMUSCULAR | Status: DC | PRN
  Administered 2021-07-24 (×2): 2 mL

## 2021-07-24 MED ORDER — HEPARIN SODIUM (PORCINE) 1000 UNIT/ML IJ SOLN
INTRAMUSCULAR | Status: AC
  Filled 2021-07-24: qty 10

## 2021-07-24 MED ORDER — SODIUM CHLORIDE 0.9 % WEIGHT BASED INFUSION
3.0000 mL/kg/h | INTRAVENOUS | Status: AC
  Administered 2021-07-24: 3 mL/kg/h via INTRAVENOUS

## 2021-07-24 MED ORDER — VERAPAMIL HCL 2.5 MG/ML IV SOLN
INTRAVENOUS | Status: AC
  Filled 2021-07-24: qty 2

## 2021-07-24 MED ORDER — ASPIRIN 81 MG PO CHEW: 81.0000 mg | CHEWABLE_TABLET | ORAL | Status: DC

## 2021-07-24 MED ORDER — VERAPAMIL HCL 2.5 MG/ML IV SOLN
INTRAVENOUS | Status: DC | PRN
  Administered 2021-07-24: 10 mL via INTRA_ARTERIAL

## 2021-07-24 MED ORDER — SODIUM CHLORIDE 0.9 % IV SOLN: 250.0000 mL | INTRAVENOUS | Status: DC | PRN

## 2021-07-24 MED ORDER — FENTANYL CITRATE (PF) 100 MCG/2ML IJ SOLN
INTRAMUSCULAR | Status: AC
  Filled 2021-07-24: qty 2

## 2021-07-24 MED ORDER — IOHEXOL 350 MG/ML SOLN
INTRAVENOUS | Status: DC | PRN
  Administered 2021-07-24 (×2): 40 mL

## 2021-07-24 MED ORDER — DIAZEPAM 5 MG PO TABS: 5.0000 mg | ORAL_TABLET | Freq: Four times a day (QID) | ORAL | Status: DC | PRN

## 2021-07-24 MED ORDER — ONDANSETRON HCL 4 MG/2ML IJ SOLN: 4.0000 mg | Freq: Four times a day (QID) | INTRAMUSCULAR | Status: DC | PRN

## 2021-07-24 MED ORDER — SODIUM CHLORIDE 0.9% FLUSH: 3.0000 mL | INTRAVENOUS | Status: DC | PRN

## 2021-07-24 MED ORDER — SODIUM CHLORIDE 0.9% FLUSH: 3.0000 mL | Freq: Two times a day (BID) | INTRAVENOUS | Status: DC

## 2021-07-24 MED ORDER — ACETAMINOPHEN 325 MG PO TABS: 650.0000 mg | ORAL_TABLET | ORAL | Status: DC | PRN

## 2021-07-24 MED ORDER — MIDAZOLAM HCL 2 MG/2ML IJ SOLN
INTRAMUSCULAR | Status: AC
  Filled 2021-07-24: qty 2

## 2021-07-24 MED ORDER — LABETALOL HCL 5 MG/ML IV SOLN: 10.0000 mg | INTRAVENOUS | Status: DC | PRN

## 2021-07-24 MED ORDER — HEPARIN SODIUM (PORCINE) 1000 UNIT/ML IJ SOLN
INTRAMUSCULAR | Status: DC | PRN
  Administered 2021-07-24: 4000 [IU] via INTRAVENOUS

## 2021-07-24 MED ORDER — SODIUM CHLORIDE 0.9 % WEIGHT BASED INFUSION: 1.0000 mL/kg/h | INTRAVENOUS | Status: DC

## 2021-07-24 MED ORDER — FENTANYL CITRATE (PF) 100 MCG/2ML IJ SOLN
INTRAMUSCULAR | Status: DC | PRN
  Administered 2021-07-24 (×3): 25 ug via INTRAVENOUS

## 2021-07-24 MED ORDER — SODIUM CHLORIDE 0.9 % IV SOLN
INTRAVENOUS | Status: DC
Start: 2021-07-24 — End: 2021-07-24

## 2021-07-24 MED ORDER — HYDRALAZINE HCL 20 MG/ML IJ SOLN: 10.0000 mg | INTRAMUSCULAR | Status: DC | PRN

## 2021-07-24 MED ORDER — MIDAZOLAM HCL 2 MG/2ML IJ SOLN
INTRAMUSCULAR | Status: DC | PRN
  Administered 2021-07-24 (×2): 1 mg via INTRAVENOUS

## 2021-07-24 SURGICAL SUPPLY — 16 items
BAND CMPR LRG ZPHR (HEMOSTASIS) ×1
BAND ZEPHYR COMPRESS 30 LONG (HEMOSTASIS) ×1 IMPLANT
CATH BALLN WEDGE 5F 110CM (CATHETERS) ×1 IMPLANT
CATH INFINITI JR4 5F (CATHETERS) ×1 IMPLANT
CATH OPTITORQUE TIG 4.0 5F (CATHETERS) ×1 IMPLANT
GLIDESHEATH SLEND SS 6F .021 (SHEATH) ×1 IMPLANT
GUIDEWIRE .025 260CM (WIRE) ×1 IMPLANT
GUIDEWIRE INQWIRE 1.5J.035X260 (WIRE) IMPLANT
INQWIRE 1.5J .035X260CM (WIRE) ×2
KIT HEART LEFT (KITS) ×2 IMPLANT
PACK CARDIAC CATHETERIZATION (CUSTOM PROCEDURE TRAY) ×2 IMPLANT
SHEATH GLIDE SLENDER 4/5FR (SHEATH) ×1 IMPLANT
SHEATH PROBE COVER 6X72 (BAG) ×1 IMPLANT
SYR MEDRAD MARK 7 150ML (SYRINGE) ×2 IMPLANT
TRANSDUCER W/STOPCOCK (MISCELLANEOUS) ×2 IMPLANT
TUBING CIL FLEX 10 FLL-RA (TUBING) ×2 IMPLANT

## 2021-07-24 NOTE — Interval H&P Note (Signed)
Cath Lab Visit (complete for each Cath Lab visit)  Clinical Evaluation Leading to the Procedure:   ACS: No.  Non-ACS:    Anginal Classification: CCS II  Anti-ischemic medical therapy: No Therapy  Non-Invasive Test Results: No non-invasive testing performed  Prior CABG: No previous CABG      History and Physical Interval Note:  07/24/2021 11:30 AM  [name]  has presented today for surgery, with the diagnosis of unstable angina.  The various methods of treatment have been discussed with the patient and family. After consideration of risks, benefits and other options for treatment, the patient has consented to  Procedure(s): RIGHT/LEFT HEART CATH AND CORONARY ANGIOGRAPHY (N/A) as a surgical intervention.  The patient's history has been reviewed, patient examined, no change in status, stable for surgery.  I have reviewed the patient's chart and labs.  Questions were answered to the patient's satisfaction.     Shelva Majestic

## 2021-07-24 NOTE — Discharge Instructions (Signed)
NO METFORMIN FOR 2 DAYS 

## 2021-07-25 ENCOUNTER — Encounter (HOSPITAL_COMMUNITY): Payer: Self-pay | Admitting: Cardiovascular Disease

## 2021-07-30 ENCOUNTER — Telehealth: Payer: Self-pay | Admitting: Cardiovascular Disease

## 2021-07-30 NOTE — Telephone Encounter (Signed)
STAT if HR is under 50 or over 120 (normal HR is 60-100 beats per minute)  What is your heart rate? 105  Do you have a log of your heart rate readings (document readings)? 109,   Do you have any other symptoms? SOB, but was not while one the phone, tired, feet and toes were purplish this morning  Patient states her watch has been going off for her high HR. She states she has also been getting SOB, but does not currently sound SOB on the phone. She says she has also been tired and that her feet and toes were purplish this morning.

## 2021-07-30 NOTE — Telephone Encounter (Signed)
Has she turned in her monitor yet?  It would be helpful to know what her monitor showed since she is having issues with elevated HRs.  Her cardiac catheterization did not show obstructive CAD. Her EF was normal on her cath.   TSH, Hgb, BNP were normal.   We can try her on a low dose beta-blocker to see if this improves her symptoms.  I would follow up with primary care for leg discoloration.  PLAN:  -Toprol XL 25 mg once daily. -Await monitor results Richardson Dopp, PA-C    07/30/2021 4:43 PM

## 2021-07-30 NOTE — Telephone Encounter (Signed)
Called pt in regards to HR.  Pt reports has felt poorly since the beginning of June.  Apple Watch alerted pt HR 130 today while sitting.  Could feel heart racing and was SOB.  Ran an EKG a few min prior to call on phone today HR 102. Also notes BLE purple/ red in color since yesterday.  Reports has chronic leg pain from past hx of leg break and knee replacements.   Reports adequate PO fluid intake.  Denies caffeine intake.  Mailed in 3 day heart monitor on 07/28/21.  Checks BP in AM: 6/29- 683/72-90 6/30- 135/76-56 7/1- 211/15-52 7/2- 142/81-68 7/3-150/88-68 7/4- 126/76-61 7/5- 126/81- 53 Pt expresses has no energy and wants to know what's wrong.  Advised pt to call 911 or report to ED if symptoms are bothersome.  Will route to [place], [place] to address.

## 2021-07-31 MED ORDER — METOPROLOL SUCCINATE ER 25 MG PO TB24: 25.0000 mg | ORAL_TABLET | Freq: Every day | ORAL | 0 refills | Status: DC

## 2021-07-31 NOTE — Telephone Encounter (Signed)
Call placed to pt.  She has been made aware that we will start Toprol XL 25 mg taking 1 daily. Pt mailed the monitor back 07/28/21, and has been made aware that we will wait to get the results and someone will call her back with review / recommendations.  Pt has been made aware to keep her f/u appt 08/11/21, she verbalized understanding.

## 2021-08-05 ENCOUNTER — Ambulatory Visit (INDEPENDENT_AMBULATORY_CARE_PROVIDER_SITE_OTHER): Payer: Medicare Other | Admitting: Physician Assistant

## 2021-08-05 ENCOUNTER — Telehealth: Payer: Self-pay | Admitting: *Deleted

## 2021-08-05 ENCOUNTER — Encounter: Payer: Self-pay | Admitting: Physician Assistant

## 2021-08-05 ENCOUNTER — Telehealth: Payer: Self-pay | Admitting: Cardiovascular Disease

## 2021-08-05 VITALS — BP 138/78 | HR 90 | Ht 59.0 in | Wt 178.0 lb

## 2021-08-05 DIAGNOSIS — R0602 Shortness of breath: Secondary | ICD-10-CM | POA: Diagnosis not present

## 2021-08-05 DIAGNOSIS — I200 Unstable angina: Secondary | ICD-10-CM

## 2021-08-05 DIAGNOSIS — R002 Palpitations: Secondary | ICD-10-CM | POA: Diagnosis not present

## 2021-08-05 DIAGNOSIS — I272 Pulmonary hypertension, unspecified: Secondary | ICD-10-CM | POA: Diagnosis not present

## 2021-08-05 DIAGNOSIS — R0683 Snoring: Secondary | ICD-10-CM

## 2021-08-05 DIAGNOSIS — R0609 Other forms of dyspnea: Secondary | ICD-10-CM | POA: Diagnosis not present

## 2021-08-05 DIAGNOSIS — I493 Ventricular premature depolarization: Secondary | ICD-10-CM | POA: Diagnosis not present

## 2021-08-05 DIAGNOSIS — I491 Atrial premature depolarization: Secondary | ICD-10-CM | POA: Diagnosis not present

## 2021-08-05 DIAGNOSIS — I10 Essential (primary) hypertension: Secondary | ICD-10-CM | POA: Diagnosis not present

## 2021-08-05 LAB — BASIC METABOLIC PANEL
BUN/Creatinine Ratio: 27 (ref 12–28)
BUN: 27 mg/dL (ref 8–27)
CO2: 30 mmol/L — ABNORMAL HIGH (ref 20–29)
Calcium: 9.7 mg/dL (ref 8.7–10.3)
Chloride: 100 mmol/L (ref 96–106)
Creatinine, Ser: 0.99 mg/dL (ref 0.57–1.00)
Glucose: 172 mg/dL — ABNORMAL HIGH (ref 70–99)
Potassium: 4.2 mmol/L (ref 3.5–5.2)
Sodium: 140 mmol/L (ref 134–144)
eGFR: 61 mL/min/{1.73_m2} (ref >59)

## 2021-08-05 LAB — MAGNESIUM: Magnesium: 2.1 mg/dL (ref 1.6–2.3)

## 2021-08-05 MED ORDER — METOPROLOL SUCCINATE ER 50 MG PO TB24: 50.0000 mg | ORAL_TABLET | Freq: Every day | ORAL | 3 refills | Status: DC

## 2021-08-05 NOTE — Assessment & Plan Note (Signed)
She continues to have shortness of breath with exertion, chest tightness, fatigue and palpitations.  The etiology of her symptoms are not entirely clear.  Recent R and L cardiac catheterization demonstrated no CAD.  Her filling pressures were normal.  She did have mild pulmonary hypertension.  She had surgery several months ago but nothing recently.  She has not traveled recently.  Her O2 sats are normal on RA.  She does have some pleuritic chest pain associated with her shortness of breath.  Her PA pressure is not that high but given her ongoing symptoms, I will get a VQ scan to rule out the possibility of CTEPH.  She does snore and she is at risk for OSA. I'm not sure that OSA would explain all of her symptoms but I do think she should have sleep testing arranged. I also think we should update her echocardiogram to be complete.    Echocardiogram   VQ scan  [place] Sleep Study  F/u 4-6 weeks  If workup completely normal consider referral to pulmonology

## 2021-08-05 NOTE — Telephone Encounter (Signed)
Pt was seen in the office by Richardson Dopp, PAC. PAC ordered an Biomedical scientist study today per Jeanann Lewandowsky, RMA. I have registered the pt into [facility].   Pt agreeable to signed waiver.

## 2021-08-05 NOTE — Assessment & Plan Note (Signed)
With mild pulmonary hypertension, daytime fatigue and witnessed snoring, will get home sleep study.

## 2021-08-05 NOTE — Assessment & Plan Note (Signed)
She has 2% PVCs on her monitor and several PVCs on her EKG today.  Question if this is a big contributor to her symptoms.  I will repeat her BMET, Mg2+ to rule out electrolyte imbalance.  Recent TSH was normal.  Recent Hgb was normal.  Increase Toprol XL to 50 mg once daily. F/u in 4-6 weeks.

## 2021-08-05 NOTE — Telephone Encounter (Signed)
Just received message. Patient is at her appointment right now with Richardson Dopp PA.

## 2021-08-05 NOTE — Patient Instructions (Signed)
Medication Instructions:  Your physician has recommended you make the following change in your medication:   INCREASE the Toprol to 50 mg taking 1 daily.  You may take 2 of the 25 mg tablets to use them up.  *If you need a refill on your cardiac medications before your next appointment, please call your pharmacy*   Lab Work: TODAY:  BMET, & MAG  If you have labs (blood work) drawn today and your tests are completely normal, you will receive your results only by: [place] (if you have MyChart) OR A paper copy in the mail If you have any lab test that is abnormal or we need to change your treatment, we will call you to review the results.   Testing/Procedures: Your physician has requested that you have an echocardiogram. Echocardiography is a painless test that uses sound waves to create images of your heart. It provides your doctor with information about the size and shape of your heart and how well your heart's chambers and valves are working. This procedure takes approximately one hour. There are no restrictions for this procedure.  Your physician has recommended that you have a sleep study. This test records several body functions during sleep, including: brain activity, eye movement, oxygen and carbon dioxide blood levels, heart rate and rhythm, breathing rate and rhythm, the flow of air through your mouth and nose, snoring, body muscle movements, and chest and belly movement.   Your physician recommends that you have a NM VQ scan.   See instructions below:  Ventilation Perfusion Scan  A ventilation-perfusion scan is a procedure to look at airflow and blood flow in the lungs. It is most often done to look for a blood clot that may have traveled to the lungs (pulmonary embolus). During this scan, radioactive compounds are injected into the bloodstream and are also breathed in. The compounds are not harmful. They are given at very low doses and stay in the body for a short time.  After the compounds are injected and breathed in, a camera is used to scan the lungs. Tell a health care provider about: Any allergies you have. All medicines you are taking, including vitamins, herbs, eye drops, creams, and over-the-counter medicines. Any blood disorders you have. Any surgeries you have had. Any medical conditions you have. Whether you are pregnant or may be pregnant. Whether or not you are breastfeeding. What are the risks? Generally, this is a safe procedure. However, problems may occur, including an allergic reaction to the radioactive compounds. What happens before the procedure? Do not use any products that contain nicotine or tobacco before the procedure. These products include cigarettes, chewing tobacco, and vaping devices, such as e-cigarettes. If you need help quitting, ask your health care provider. Ask your health care provider about changing or stopping your regular medicines. This is especially important if you are taking diabetes medicines or blood thinners. What happens during the procedure? An IV will be inserted into one of your veins. The IV will stay in place for the entire exam. A small amount of radioactive material will be injected into your bloodstream. Your lungs will be scanned with a camera. This camera will record the images. You will be asked to inhale a second radioactive compound. Take deep breaths as directed by the health care provider. Your lungs will be scanned again. Your IV will be removed. The procedure may vary among health care providers and hospitals. What can I expect after the procedure? You may go home  unless your health care provider instructs you differently. You may continue with your normal activities and diet as instructed by your health care provider. It is up to you to get the results of your procedure. Ask your health care provider, or the department that is doing the procedure, when your results will be ready. Summary A  ventilation-perfusion scan is a procedure to look at airflow and blood flow in the lungs. It is most often done to look for blood clots that may have traveled to the lungs. The radioactive compounds used during the procedure are not harmful. Your lungs will be scanned with a camera. This camera will record the images. It is up to you to get the results of your procedure. Ask your health care provider, or the department that is doing the procedure, when your results will be ready. This information is not intended to replace advice given to you by your health care provider. Make sure you discuss any questions you have with your health care provider. Document Revised: 09/25/2020 Document Reviewed: 12/15/2019 Elsevier Patient Education  [place]: At [facility], you and your health needs are our priority.  As part of our continuing mission to provide you with exceptional heart care, we have created designated Provider Care Teams.  These Care Teams include your primary Cardiologist (physician) and Advanced Practice Providers (APPs -  Physician Assistants and Nurse Practitioners) who all work together to provide you with the care you need, when you need it.  We recommend signing up for the patient portal called "MyChart".  Sign up information is provided on this After Visit Summary.  MyChart is used to connect with patients for Virtual Visits (Telemedicine).  Patients are able to view lab/test results, encounter notes, upcoming appointments, etc.  Non-urgent messages can be sent to your provider as well.   To learn more about what you can do with MyChart, go to [email or web address].    Your next appointment:   4-6 week(s)  The format for your next appointment:   In Person  Provider:   Jenkins Rouge, MD  or Richardson Dopp, PA-C         Other Instructions   Important Information About Sugar

## 2021-08-05 NOTE — Telephone Encounter (Signed)
ITAMAR SET UP 08/05/21

## 2021-08-05 NOTE — Assessment & Plan Note (Signed)
BP is controlled.  Continue Benicar 20 mg once daily, indapamide 1.25 mg once daily and increase Toprol XL as noted.

## 2021-08-05 NOTE — Progress Notes (Signed)
Cardiology Office Note:    Date:  08/05/2021   ID:  [name], DOB [birth year], MRN [id number]  PCP:  Janith Lima, MD  [place] Prov[facility] Cardiologist:  Jenkins Rouge, MD     Referring MD: Janith Lima, MD   Chief Complaint:  Chest Pain and Palpitations    Patient Profile: Chest pain  Cath 06/2021: normal coronary arteries  Mild pulmonary hypertension  RHC 06/2021: mean PAP 30 Hypertension  Hyperlipidemia  Hypothyroidism  Diverticulitis s/p partial colectomy in 2019 Diabetes mellitus GERD    Prior CV Studies: CARDIAC CATHETERIZATION 07/30/2021 Normal coronary arteries EF 55-65 LVEDP 12 Mild pulmonary hypertension, mean PAP 30  ZIO MONITOR 3 DAYS Patient had a min HR of 44 bpm, max HR of 131 bpm, and avg HR of 72 bpm. Predominant underlying rhythm was Sinus Rhythm. Isolated SVEs were rare (<1.0%), and no SVE Couplets or SVE Triplets were present. Isolated VEs were occasional (2.0%, 7328), VE  Couplets were rare (<1.0%, 2), and no VE Triplets were present. Ventricular Bigeminy and Trigeminy were present.   GATED SPECT MYO PERF W/LEXISCAN STRESS 1D 02/07/2018 EF 63, normal perfusion, low risk   ECHO COMPLETE WO IMAGING ENHANCING AGENT 03/22/2017 EF 60-65, PASP 33    CPET 03/06/2013 Conclusion: Exercise testing with gas exchange demonstrates a  normal functional capacity when compared to matched sedentary  norms. There was not a clear circulatory or ventilatory  limitation to the exercise.  History of Present Illness:   [name] is a 71 y.o. female with the above problem list.  She was seen a few weeks ago with symptoms that were consistent with angina.  R and L heart cath was arranged and demonstrated no CAD.  Her filling pressures were normal but her mean PA pressure was mildly elevated.  She had noted palpitations previously and had a 3 day Zio arranged by her PCP.  The official read is pending but this did not show significantly abnormal  rhythms.  She did have 2% PVCs.    She was to follow up next week but was added on today for evaluation.  She continues to feel the same way.  She has shortness of breath with minimal activity and notes chest heaviness.  She continues to feel palpitations and records her HR up into the 1 teens and then it drops into the 50s.  This is from her smart watch.  I suspect the PVCs are causing her low HR recordings.  She has not had syncope.  She feels more comfortable sleeping on an incline. She has not had significant leg edema or weight gain.     Past Medical History:  Diagnosis Date   Acute pyelonephritis 05/02/2012   Asthma    Diverticulitis 03/24/2017   see report [facility] [place] [facility]   Dyspnea    nl PFTs, and echo   GERD (gastroesophageal reflux disease)    Headache(784.0)    Heart murmur    History of kidney stones    IBS (irritable bowel syndrome)    Nephrolithiasis    Pre-diabetes 12/15   Recurrent oral ulcers    Thyroid goiter    I 131 rx and then  total throidectomy 2005 [facility]   Current Medications: Current Meds  Medication Sig   acetaminophen (TYLENOL) 500 MG tablet Take 1,000 mg by mouth 3 (three) times daily.   aspirin EC 81 MG tablet Take 1 tablet (81 mg total) by mouth daily. Swallow whole.   Blood Glucose Monitoring Suppl (  FREESTYLE LITE) w/Device KIT USE TWO TIMES A DAY TO TEST GLUCOSE CONTROL   Continuous Blood Gluc Receiver (FREESTYLE LIBRE READER) DEVI Monitor blood sugar  for 14 days  and repeat as indicated   Continuous Blood Gluc Sensor (FREESTYLE LIBRE 14 DAY SENSOR) MISC MONITOR BLOOD SUGAR CONTINUOUS FOR 14 DAYS AT A TIME   dapagliflozin propanediol (FARXIGA) 10 MG TABS tablet Take 1 tablet (10 mg total) by mouth daily before breakfast.   FREESTYLE LITE test strip USE 1 STRIP TO TEST TWICE A DAY   indapamide (LOZOL) 1.25 MG tablet Take 1 tablet (1.25 mg total) by mouth daily.   metFORMIN (GLUCOPHAGE-XR) 750 MG 24 hr tablet Take 1 tablet (750 mg total) by  mouth daily with breakfast.   metoprolol succinate (TOPROL-XL) 50 MG 24 hr tablet Take 1 tablet (50 mg total) by mouth daily. Take with or immediately following a meal.   nitroGLYCERIN (NITROSTAT) 0.4 MG SL tablet Place 1 tablet (0.4 mg total) under the tongue every 5 (five) minutes as needed for chest pain.   olmesartan (BENICAR) 20 MG tablet TAKE ONE TABLET BY MOUTH DAILY   pantoprazole (PROTONIX) 40 MG tablet TAKE ONE TABLET BY MOUTH TWICE A DAY   rOPINIRole (REQUIP) 0.5 MG tablet TAKE 1 OR 2 TABLETS BY MOUTH DAILY AT NOON AND AT 5PM , THEN 3 TABLETS EVERY NIGHT AT BEDTIME (Patient taking differently: Take 1-1.5 mg by mouth See admin instructions. Take 1 mg in the morning and lunch, and 1.5 mg at bedtime)   rosuvastatin (CRESTOR) 10 MG tablet TAKE ONE TABLET BY MOUTH DAILY   SYNTHROID 112 MCG tablet TAKE ONE TABLET BY MOUTH EVERY MORNING BEFORE BREAKFAST   [DISCONTINUED] metoprolol succinate (TOPROL XL) 25 MG 24 hr tablet Take 1 tablet (25 mg total) by mouth daily.    Allergies:   Hydralazine, Ciprofloxacin, Propofol, and Ritalin [methylphenidate hcl]   Social History   Tobacco Use   Smoking status: Never   Smokeless tobacco: Never  Vaping Use   Vaping Use: Never used  Substance Use Topics   Alcohol use: Yes    Comment: seldom   Drug use: No    Family Hx: The patient's family history includes Aneurysm in her father; Arthritis in an other family member; COPD in her sister; Cancer in her mother and another family member; Coronary artery disease in her father and sister; Diabetes in an other family member; Heart disease in an other family member; Hyperlipidemia in an other family member; Hypertension in an other family member; Stroke in an other family member.  Review of Systems  Constitutional: Negative for chills and fever.  Respiratory:  Negative for cough.   Gastrointestinal:  Negative for diarrhea, hematochezia and vomiting.  Genitourinary:  Negative for hematuria.      EKGs/Labs/Other Test Reviewed:    EKG:  EKG is   ordered today.  The ekg ordered today demonstrates NSR, HR 87, normal axis, no ST-T wave changes, frequent PVCs, QTc 447  Recent Labs: 10/05/2020: ALT 13 12/26/2020: Magnesium 1.8 06/30/2021: TSH 3.48 07/22/2021: BUN 22; Creatinine, Ser 0.97; NT-Pro BNP <36; Platelets 249 07/24/2021: Hemoglobin 13.6; Potassium 4.2; Sodium 140   Recent Lipid Panel Recent Labs    01/21/21 0851  CHOL 145  TRIG 160.0*  HDL 53.80  VLDL 32.0  LDLCALC 59     Risk Assessment/Calculations/Metrics:     STOP-Bang Score:  5           Physical Exam:    VS:  BP 138/78  Pulse 90   Ht 4' 11"  (1.499 m)   Wt 178 lb (80.7 kg)   SpO2 94%   BMI 35.95 kg/m     Wt Readings from Last 3 Encounters:  08/05/21 178 lb (80.7 kg)  07/24/21 174 lb 5.8 oz (79.1 kg)  07/22/21 174 lb 9.6 oz (79.2 kg)    Constitutional:      Appearance: Healthy appearance. Not in distress.  Neck:     Vascular: No JVR. JVD normal.  Pulmonary:     Effort: Pulmonary effort is normal.     Breath sounds: No wheezing. No rales.  Cardiovascular:     Normal rate. Regular rhythm. Normal S1. Normal S2.      Murmurs: There is a grade 1/6 systolic murmur at the URSB.  Edema:    Peripheral edema absent.  Abdominal:     Palpations: Abdomen is soft.  Skin:    General: Skin is warm and dry.  Neurological:     Mental Status: Alert and oriented to person, place and time.         ASSESSMENT & PLAN:   Shortness of breath She continues to have shortness of breath with exertion, chest tightness, fatigue and palpitations.  The etiology of her symptoms are not entirely clear.  Recent R and L cardiac catheterization demonstrated no CAD.  Her filling pressures were normal.  She did have mild pulmonary hypertension.  She had surgery several months ago but nothing recently.  She has not traveled recently.  Her O2 sats are normal on RA.  She does have some pleuritic chest pain associated with her  shortness of breath.  Her PA pressure is not that high but given her ongoing symptoms, I will get a VQ scan to rule out the possibility of CTEPH.  She does snore and she is at risk for OSA. I'm not sure that OSA would explain all of her symptoms but I do think she should have sleep testing arranged. I also think we should update her echocardiogram to be complete.   Echocardiogram  VQ scan [facility] Sleep Study F/u 4-6 weeks If workup completely normal consider referral to pulmonology   Palpitations She has 2% PVCs on her monitor and several PVCs on her EKG today.  Question if this is a big contributor to her symptoms.  I will repeat her BMET, Mg2+ to rule out electrolyte imbalance.  Recent TSH was normal.  Recent Hgb was normal.  Increase Toprol XL to 50 mg once daily. F/u in 4-6 weeks.  Primary hypertension BP is controlled.  Continue Benicar 20 mg once daily, indapamide 1.25 mg once daily and increase Toprol XL as noted.   Snoring With mild pulmonary hypertension, daytime fatigue and witnessed snoring, will get home sleep study.             Dispo:  Return in about 6 weeks (around 09/16/2021) for Follow up after testing w/ Dr. Johnsie Cancel, or Richardson Dopp, PA-C.   Medication Adjustments/Labs and Tests Ordered: Current medicines are reviewed at length with the patient today.  Concerns regarding medicines are outlined above.  Tests Ordered: Orders Placed This Encounter  Procedures   NM Pulmonary Perf and Vent   Basic metabolic panel   Magnesium   EKG 12-Lead   ECHOCARDIOGRAM COMPLETE   [facility] Sleep Study   Medication Changes: Meds ordered this encounter  Medications   metoprolol succinate (TOPROL-XL) 50 MG 24 hr tablet    Sig: Take 1 tablet (50 mg total) by mouth  daily. Take with or immediately following a meal.    Dispense:  90 tablet    Refill:  3   Signed, Richardson Dopp, PA-C  08/05/2021 12:37 PM    [address], [place], [place]  [address] Phone: [phone]; Fax: [phone]

## 2021-08-05 NOTE — Telephone Encounter (Signed)
Pt c/o BP issue: STAT if pt c/o blurred vision, one-sided weakness or slurred speech  1. What are your last 5 BP readings?153/94; 157/98  2. Are you having any other symptoms (ex. Dizziness, headache, blurred vision, passed out)? Sob, fatigue   3. What is your BP issue? Elevated    Schd patient for today at 11:20 with Kathlen Mody

## 2021-08-06 ENCOUNTER — Encounter: Payer: Self-pay | Admitting: Physician Assistant

## 2021-08-06 ENCOUNTER — Ambulatory Visit (HOSPITAL_COMMUNITY)
Admission: RE | Admit: 2021-08-06 | Discharge: 2021-08-06 | Disposition: A | Discharge status: Home / Self Care | Payer: Medicare Other | Source: Ambulatory Visit | Attending: Physician Assistant | Admitting: Physician Assistant

## 2021-08-06 DIAGNOSIS — Z9289 Personal history of other medical treatment: Secondary | ICD-10-CM | POA: Insufficient documentation

## 2021-08-06 DIAGNOSIS — I272 Pulmonary hypertension, unspecified: Secondary | ICD-10-CM | POA: Insufficient documentation

## 2021-08-06 DIAGNOSIS — R002 Palpitations: Secondary | ICD-10-CM | POA: Diagnosis not present

## 2021-08-06 DIAGNOSIS — R0600 Dyspnea, unspecified: Secondary | ICD-10-CM | POA: Diagnosis not present

## 2021-08-06 DIAGNOSIS — R0602 Shortness of breath: Secondary | ICD-10-CM | POA: Diagnosis not present

## 2021-08-06 DIAGNOSIS — K219 Gastro-esophageal reflux disease without esophagitis: Secondary | ICD-10-CM | POA: Diagnosis not present

## 2021-08-06 LAB — ECHOCARDIOGRAM COMPLETE
Area-P 1/2: 3.65 cm2
Calc EF: 52.5 %
S' Lateral: 2.3 cm
Single Plane A2C EF: 54.1 %
Single Plane A4C EF: 56.6 %

## 2021-08-06 NOTE — Progress Notes (Signed)
  Echocardiogram 2D Echocardiogram has been performed.  [name] 08/06/2021, 10:42 AM

## 2021-08-08 ENCOUNTER — Ambulatory Visit (HOSPITAL_COMMUNITY)
Admission: RE | Admit: 2021-08-08 | Discharge: 2021-08-08 | Disposition: A | Discharge status: Home / Self Care | Payer: Medicare Other | Source: Ambulatory Visit | Attending: Physician Assistant | Admitting: Physician Assistant

## 2021-08-08 ENCOUNTER — Telehealth: Payer: Self-pay | Admitting: Physician Assistant

## 2021-08-08 DIAGNOSIS — I200 Unstable angina: Secondary | ICD-10-CM | POA: Diagnosis not present

## 2021-08-08 DIAGNOSIS — R0683 Snoring: Secondary | ICD-10-CM | POA: Insufficient documentation

## 2021-08-08 DIAGNOSIS — I10 Essential (primary) hypertension: Secondary | ICD-10-CM | POA: Insufficient documentation

## 2021-08-08 DIAGNOSIS — I272 Pulmonary hypertension, unspecified: Secondary | ICD-10-CM | POA: Diagnosis not present

## 2021-08-08 DIAGNOSIS — R0609 Other forms of dyspnea: Secondary | ICD-10-CM | POA: Diagnosis not present

## 2021-08-08 DIAGNOSIS — R0602 Shortness of breath: Secondary | ICD-10-CM | POA: Insufficient documentation

## 2021-08-08 DIAGNOSIS — R002 Palpitations: Secondary | ICD-10-CM | POA: Insufficient documentation

## 2021-08-08 DIAGNOSIS — R079 Chest pain, unspecified: Secondary | ICD-10-CM | POA: Diagnosis not present

## 2021-08-08 MED ORDER — TECHNETIUM TO 99M ALBUMIN AGGREGATED
4.0000 | Freq: Once | INTRAVENOUS | Status: AC | PRN
  Administered 2021-08-08: 4 via INTRAVENOUS

## 2021-08-08 NOTE — Telephone Encounter (Signed)
I left message for the pt that once we have the authorization from the insurance , we will call her with the PIN #.

## 2021-08-08 NOTE — Telephone Encounter (Signed)
I left message for the pt that once we have the authorization from the insurance , we will call her with the PIN #.       Note    You  [place], [place] 2 minutes ago (10:39 AM)   Joneen Roach N routed conversation to You 1 hour ago (9:09 AM)   Joneen Roach N 1 hour ago (9:09 AM)   [facility] Patient is calling stating she was advised by our office she is supposed to be contacted with a pin number and confirmation if the testing is covered by insurance for her [facility] test. Please advise.       Note

## 2021-08-08 NOTE — Telephone Encounter (Signed)
Patient is calling stating she was advised by our office she is supposed to be contacted with a pin number and confirmation if the testing is covered by insurance for her Itamar test. Please advise.

## 2021-08-11 ENCOUNTER — Ambulatory Visit: Payer: Medicare Other | Admitting: Physician Assistant

## 2021-08-13 NOTE — Telephone Encounter (Signed)
Called and made the patient aware that she may proceed with the [facility] Sleep Study. PIN # provided to the patient. Patient made aware that she will be contacted after the test has been read with the results and any recommendations. Patient verbalized understanding and thanked me for the call.   Pt has been given PIN # [id number]. Pt will do sleep study one night this week. Pt aware we will call her with the results once completed and ready by cardiologist.

## 2021-08-14 NOTE — Telephone Encounter (Signed)
Prior Authorization for [facility] sent to MEDICARE. NO PA REQ

## 2021-08-18 ENCOUNTER — Other Ambulatory Visit (HOSPITAL_COMMUNITY): Payer: Medicare Other

## 2021-08-29 ENCOUNTER — Encounter (INDEPENDENT_AMBULATORY_CARE_PROVIDER_SITE_OTHER): Payer: Medicare Other | Admitting: Cardiology

## 2021-08-29 DIAGNOSIS — G4733 Obstructive sleep apnea (adult) (pediatric): Secondary | ICD-10-CM | POA: Diagnosis not present

## 2021-08-30 NOTE — Procedures (Signed)
   SLEEP STUDY REPORT Patient Information Study Date: 08/29/21 Patient Name: [name] Patient ID: [id number] Birth Date: 07/01/2050 Age: 71 Gender: Female BMI: 36.0 (W=178 lb, H=4' 11'') Referring Physician: Richardson Dopp, PA  TEST DESCRIPTION: Home sleep apnea testing was completed using the WatchPat, a Type 1 device, utilizing peripheral arterial tonometry (PAT), chest movement, actigraphy, pulse oximetry, pulse rate, body position and snore. AHI was calculated with apnea and hypopnea using valid sleep time as the denominator. RDI includes apneas, hypopneas, and RERAs. The data acquired and the scoring of sleep and all associated events were performed in accordance with the recommended standards and specifications as outlined in the AASM Manual for the Scoring of Sleep and Associated Events 2.2.0 (2015).  FINDINGS: 1. Severe Obstructive Sleep Apnea with AHI 40.8/hr. 2. Mild Central Sleep Apnea with pAHIc 11.3/hr. 3. Oxygen desaturations as low as 79%. 4. Severe snoring was present. O2 sats were < 88% for 13.5 min. 5. Total sleep time was 3 hrs and 21 min. 6. 3.6% of total sleep time was spent in REM sleep. 7. Normal sleep onset latency at 16 min. 8. Prolonged REM sleep onset latency at 358 min. 9. Total awakenings were 18.  DIAGNOSIS: Severe Obstructive Sleep Apnea (G47.33) Nocturnal Hypoxemia  RECOMMENDATIONS: 1. Clinical correlation of these findings is necessary. The decision to treat obstructive sleep apnea (OSA) is usually based on the presence of apnea symptoms or the presence of associated medical conditions such as Hypertension, Congestive Heart Failure, Atrial Fibrillation or Obesity. The most common symptoms of OSA are snoring, gasping for breath while sleeping, daytime sleepiness and fatigue.  2. Initiating apnea therapy is recommended given the presence of symptoms and/or associated conditions. Recommend proceeding with one of the following:   a.  Auto-CPAP therapy with a pressure range of 5-20cm H2O.   b. An oral appliance (OA) that can be obtained from certain dentists with expertise in sleep medicine. These are primarily of use in non-obese patients with mild and moderate disease.   c. An ENT consultation which may be useful to look for specific causes of obstruction and possible treatment options.   d. If patient is intolerant to PAP therapy, consider referral to ENT for evaluation for hypoglossal nerve stimulator.  3. Close follow-up is necessary to ensure success with CPAP or oral appliance therapy for maximum benefit .  4. A follow-up oximetry study on CPAP is recommended to assess the adequacy of therapy and determine the need for supplemental oxygen or the potential need for Bi-level therapy. An arterial blood gas to determine the adequacy of baseline ventilation and oxygenation should also be considered.  5. Healthy sleep recommendations include: adequate nightly sleep (normal 7-9 hrs/night), avoidance of caffeine after noon and alcohol near bedtime, and maintaining a sleep environment that is cool, dark and quiet.  6. Weight loss for overweight patients is recommended. Even modest amounts of weight loss can significantly improve the severity of sleep apnea.  7. Snoring recommendations include: weight loss where appropriate, side sleeping, and avoidance of alcohol before bed.  8. Operation of motor vehicle should be avoided when sleepy.  Signature: Fransico Him, MD; [facility]; [place], [place] Board of Sleep Medicine Electronically Signed: 08/31/21

## 2021-08-31 ENCOUNTER — Ambulatory Visit: Payer: Medicare Other

## 2021-08-31 DIAGNOSIS — R0602 Shortness of breath: Secondary | ICD-10-CM

## 2021-08-31 DIAGNOSIS — I272 Pulmonary hypertension, unspecified: Secondary | ICD-10-CM

## 2021-08-31 DIAGNOSIS — R002 Palpitations: Secondary | ICD-10-CM

## 2021-09-08 DIAGNOSIS — G4733 Obstructive sleep apnea (adult) (pediatric): Secondary | ICD-10-CM | POA: Insufficient documentation

## 2021-09-08 NOTE — Progress Notes (Unsigned)
Cardiology Office Note:    Date:  09/09/2021   ID:  [name], DOB December 23, 1950, MRN [id number]  PCP:  Janith Lima, MD  [place] Providers Cardiologist:  Jenkins Rouge, MD     Referring MD: Janith Lima, MD   Chief Complaint:  F/u for Dyspnea    Patient Profile: Chest pain  Cath 06/2021: normal coronary arteries  Mild pulmonary hypertension  RHC 06/2021: mean PAP 30 Hypertension  Hyperlipidemia  Hypothyroidism  Diverticulitis s/p partial colectomy in 2019 Diabetes mellitus GERD  Sleep apnea  Prior CV Studies: ECHO COMPLETE WO IMAGING ENHANCING AGENT 08/06/2021 EF 65-70, no RWMA, Gr 1 DD, normal RVSF, TR signal not adequate to assess PAP, trivial MR, AV sclerosis w/o AS    VQ scan 08/09/2021 Normal perfusion scan  CARDIAC CATHETERIZATION 07/24/2021 Normal coronary arteries EF 55-65 // LVEDP 12; Mild pulmonary hypertension, mean PAP 30   ZIO MONITOR 3 DAYS Patient had a min HR of 44 bpm, max HR of 131 bpm, and avg HR of 72 bpm. Predominant underlying rhythm was Sinus Rhythm.  PACs <1%, PVCs 2%    GATED SPECT MYO PERF W/LEXISCAN STRESS 1D 02/07/2018 EF 63, normal perfusion, low risk   ECHO COMPLETE WO IMAGING ENHANCING AGENT 03/22/2017 EF 60-65, PASP 33    CPET 03/06/2013 Normal functional capacity, no clear circulatory or ventilatory limitation with exercise  History of Present Illness:   [name] is a 71 y.o. female with the above problem list.  She was last seen 08/05/2021.  She continued to have symptoms of shortness of breath with minimal activity as well as chest heaviness.  Recent right left heart catheterization demonstrated no CAD and her filling pressures were normal.  She did have evidence of mild pulmonary hypertension.  I increased her beta-blocker and set her up for an echocardiogram, VQ scan and home sleep study. Her echocardiogram demonstrated normal LV function and normal RVSF.  PA pressure could not be adequately assessed on  the echocardiogram.  VQ scan was normal.  Sleep study did demonstrate Lin sleep apnea.  She is currently getting set up for CPAP titration.  She returns for follow-up. She is here with her friend. She continues to have fatigue, shortness of breath, palpitations. She never did start the Metoprolol.     Past Medical History:  Diagnosis Date   Acute pyelonephritis 05/02/2012   Asthma    Diverticulitis 03/24/2017   see report [facility] [place] [facility]   Dyspnea    nl PFTs, and echo   GERD (gastroesophageal reflux disease)    Headache(784.0)    Heart murmur    History of echocardiogram    Echocardiogram 7/23: EF 65-70, no RWMA, Gr 1 DD, normal RVSF, inadequate TR signal to est PASP, trivial MR, AV sclerosis w/o AS   History of kidney stones    IBS (irritable bowel syndrome)    Nephrolithiasis    Pre-diabetes 12/2013   Recurrent oral ulcers    Thyroid goiter    I 131 rx and then  total throidectomy 2005 [facility]   Current Medications: Current Meds  Medication Sig   acetaminophen (TYLENOL) 500 MG tablet Take 1,000 mg by mouth 3 (three) times daily.   aspirin EC 81 MG tablet Take 1 tablet (81 mg total) by mouth daily. Swallow whole.   Blood Glucose Monitoring Suppl (FREESTYLE LITE) w/Device KIT USE TWO TIMES A DAY TO TEST GLUCOSE CONTROL   dapagliflozin propanediol (FARXIGA) 10 MG TABS tablet Take 1 tablet (10 mg  total) by mouth daily before breakfast.   FREESTYLE LITE test strip USE 1 STRIP TO TEST TWICE A DAY   indapamide (LOZOL) 1.25 MG tablet Take 1 tablet (1.25 mg total) by mouth daily.   metFORMIN (GLUCOPHAGE-XR) 750 MG 24 hr tablet Take 1 tablet (750 mg total) by mouth daily with breakfast.   metoprolol succinate (TOPROL-XL) 50 MG 24 hr tablet Take 1 tablet (50 mg total) by mouth daily. Take with or immediately following a meal. (Patient taking differently: Take 25 mg by mouth at bedtime. Take with or immediately following a meal.)   nitroGLYCERIN (NITROSTAT) 0.4 MG SL tablet Place  1 tablet (0.4 mg total) under the tongue every 5 (five) minutes as needed for chest pain.   olmesartan (BENICAR) 20 MG tablet TAKE ONE TABLET BY MOUTH DAILY   pantoprazole (PROTONIX) 40 MG tablet TAKE ONE TABLET BY MOUTH TWICE A DAY   rOPINIRole (REQUIP) 0.5 MG tablet TAKE 1 OR 2 TABLETS BY MOUTH DAILY AT NOON AND AT 5PM , THEN 3 TABLETS EVERY NIGHT AT BEDTIME   rosuvastatin (CRESTOR) 10 MG tablet TAKE ONE TABLET BY MOUTH DAILY   SYNTHROID 112 MCG tablet TAKE ONE TABLET BY MOUTH EVERY MORNING BEFORE BREAKFAST    Allergies:   Hydralazine, Ciprofloxacin, Propofol, and Ritalin [methylphenidate hcl]   Social History   Tobacco Use   Smoking status: Never   Smokeless tobacco: Never  Vaping Use   Vaping Use: Never used  Substance Use Topics   Alcohol use: Yes    Comment: seldom   Drug use: No    Family Hx: The patient's family history includes Aneurysm in her father; Arthritis in an other family member; COPD in her sister; Cancer in her mother and another family member; Coronary artery disease in her father and sister; Diabetes in an other family member; Heart disease in an other family member; Hyperlipidemia in an other family member; Hypertension in an other family member; Stroke in an other family member.  ROS - See HPI  EKGs/Labs/Other Test Reviewed:    EKG:  EKG is not ordered today.  The ekg ordered today demonstrates n/a  Recent Labs: 10/05/2020: ALT 13 06/30/2021: TSH 3.48 07/22/2021: NT-Pro BNP <36; Platelets 249 07/24/2021: Hemoglobin 13.6 08/05/2021: BUN 27; Creatinine, Ser 0.99; Magnesium 2.1; Potassium 4.2; Sodium 140   Recent Lipid Panel Recent Labs    01/21/21 0851  CHOL 145  TRIG 160.0*  HDL 53.80  VLDL 32.0  LDLCALC 59     Risk Assessment/Calculations/Metrics:     STOP-Bang Score:  5           Physical Exam:    VS:  BP 108/64   Pulse 95   Ht _0  (1.499 m)   Wt 176 lb (79.8 kg)   SpO2 98%   BMI 35.55 kg/m     Wt Readings from Last 3 Encounters:   09/09/21 176 lb (79.8 kg)  08/05/21 178 lb (80.7 kg)  07/24/21 174 lb 5.8 oz (79.1 kg)    Constitutional:      Appearance: Healthy appearance. Not in distress.  Neck:     Vascular: JVD normal.  Pulmonary:     Effort: Pulmonary effort is normal.     Breath sounds: No wheezing. No rales.  Cardiovascular:     Normal rate. Regular rhythm. Normal S1. Normal S2.      Murmurs: There is no murmur.  Edema:    Peripheral edema absent.  Abdominal:     Palpations: Abdomen is  soft.  Skin:    General: Skin is warm and dry.  Neurological:     Mental Status: Alert and oriented to person, place and time.         ASSESSMENT & PLAN:   Obstructive sleep apnea Cardiac catheterization did not show CAD. VQ scan was normal. She had mild pulmonary hypertension on RHC. Monitor showed NSR, PACs (1%), PVCs (2%). Sleep study demonstrated Lin Obstructive Sleep Apnea with AHI 40.8/hr and O2 sat decreased as low as 79%. I think all of her symptoms are explained by OSA. I have reassured her that she should feel much better once she is on CPAP. If her breathing does not improve with CPAP, I would consider referral to Pulmonology.   Primary hypertension The patient's blood pressure is controlled on her current regimen.  Continue current therapy.    Palpitations I have encouraged her to try Metoprolol succinate 25 mg at night to see if this will help her palpitations.   Obesity (BMI 30-39.9) She would like help with wieight loss and exercise Refer to Pharm[facility] weight loss clinic to [employer] Refer to PREP program for exercise and wt loss           Dispo:  Return in about 6 months (around 03/12/2022) for Routine Follow Up w Dr. Johnsie Cancel.   Medication Adjustments/Labs and Tests Ordered: Current medicines are reviewed at length with the patient today.  Concerns regarding medicines are outlined above.  Tests Ordered: Orders Placed This Encounter  Procedures   Amb Referral To Provider Referral  Exercise Program (P.R.E.P)   AMB Referral to [facility]   Medication Changes: No orders of the defined types were placed in this encounter.  Signed, Richardson Dopp, PA-C  09/09/2021 12:18 PM    [address] [place], [place], [place]  [address] Phone: [phone]; Fax: [phone]

## 2021-09-09 ENCOUNTER — Telehealth: Payer: Self-pay | Admitting: *Deleted

## 2021-09-09 ENCOUNTER — Encounter: Payer: Self-pay | Admitting: Physician Assistant

## 2021-09-09 ENCOUNTER — Ambulatory Visit (INDEPENDENT_AMBULATORY_CARE_PROVIDER_SITE_OTHER): Payer: Medicare Other | Admitting: Physician Assistant

## 2021-09-09 VITALS — BP 108/64 | HR 95 | Ht 59.0 in | Wt 176.0 lb

## 2021-09-09 DIAGNOSIS — E669 Obesity, unspecified: Secondary | ICD-10-CM | POA: Diagnosis not present

## 2021-09-09 DIAGNOSIS — R002 Palpitations: Secondary | ICD-10-CM | POA: Diagnosis not present

## 2021-09-09 DIAGNOSIS — I10 Essential (primary) hypertension: Secondary | ICD-10-CM

## 2021-09-09 DIAGNOSIS — G4733 Obstructive sleep apnea (adult) (pediatric): Secondary | ICD-10-CM

## 2021-09-09 DIAGNOSIS — R0683 Snoring: Secondary | ICD-10-CM

## 2021-09-09 DIAGNOSIS — I200 Unstable angina: Secondary | ICD-10-CM | POA: Diagnosis not present

## 2021-09-09 NOTE — Telephone Encounter (Signed)
The patient has been notified of the result and verbalized understanding.  All questions (if any) were answered. Marolyn Hammock, [place] 09/09/2021 1:02 PM    Will prcert Titration

## 2021-09-09 NOTE — Telephone Encounter (Signed)
-----   Message from Lauralee Evener, [place] sent at 09/01/2021  8:16 AM EDT -----  ----- Message ----- From: Sueanne Margarita, MD Sent: 08/30/2021   9:54 PM EDT To: [facility] Sleep Studies  Please let patient know that they have sleep apnea.  Recommend therapeutic CPAP titration for treatment of patient's sleep disordered breathing.  If unable to perform an in lab titration then initiate ResMed auto CPAP from 4 to 15cm H2O with heated humidity and mask of choice and overnight pulse ox on CPAP.

## 2021-09-09 NOTE — Assessment & Plan Note (Signed)
I have encouraged her to try Metoprolol succinate 25 mg at night to see if this will help her palpitations.

## 2021-09-09 NOTE — Assessment & Plan Note (Signed)
She would like help with wieight loss and exercise  Refer to [facility] weight loss clinic to [employer]  Refer to PREP program for exercise and wt loss

## 2021-09-09 NOTE — Assessment & Plan Note (Signed)
Cardiac catheterization did not show CAD. VQ scan was normal. She had mild pulmonary hypertension on RHC. Monitor showed NSR, PACs (1%), PVCs (2%). Sleep study demonstrated severe Obstructive Sleep Apnea with AHI 40.8/hr and O2 sat decreased as low as 79%. I think all of her symptoms are explained by OSA. I have reassured her that she should feel much better once she is on CPAP. If her breathing does not improve with CPAP, I would consider referral to Pulmonology.

## 2021-09-09 NOTE — Patient Instructions (Signed)
Medication Instructions:  Your physician has recommended you make the following change in your medication:   START Toprol 50 mg taking 1/2 at bedtime   *If you need a refill on your cardiac medications before your next appointment, please call your pharmacy*   Lab Work: None ordered  If you have labs (blood work) drawn today and your tests are completely normal, you will receive your results only by: [place] (if you have MyChart) OR A paper copy in the mail If you have any lab test that is abnormal or we need to change your treatment, we will call you to review the results.   Testing/Procedures: None ordered  You have been referred to [place] for Weight Loss You have been referred to PREP program at the [facility] for weight loss / exercise   Follow-Up: At [facility], you and your health needs are our priority.  As part of our continuing mission to provide you with exceptional heart care, we have created designated Provider Care Teams.  These Care Teams include your primary Cardiologist (physician) and Advanced Practice Providers (APPs -  Physician Assistants and Nurse Practitioners) who all work together to provide you with the care you need, when you need it.  We recommend signing up for the patient portal called "MyChart".  Sign up information is provided on this After Visit Summary.  MyChart is used to connect with patients for Virtual Visits (Telemedicine).  Patients are able to view lab/test results, encounter notes, upcoming appointments, etc.  Non-urgent messages can be sent to your provider as well.   To learn more about what you can do with MyChart, go to [email or web address].    Your next appointment:   6 month(s)  The format for your next appointment:   In Person  Provider:   Jenkins Rouge, MD     Other Instructions   Important Information About Sugar

## 2021-09-09 NOTE — Assessment & Plan Note (Signed)
The patient's blood pressure is controlled on her current regimen.  Continue current therapy.   

## 2021-09-12 ENCOUNTER — Telehealth: Payer: Self-pay

## 2021-09-12 ENCOUNTER — Ambulatory Visit (HOSPITAL_BASED_OUTPATIENT_CLINIC_OR_DEPARTMENT_OTHER): Payer: Medicare Other | Attending: Cardiology | Admitting: Cardiology

## 2021-09-12 DIAGNOSIS — R0683 Snoring: Secondary | ICD-10-CM

## 2021-09-12 DIAGNOSIS — G4733 Obstructive sleep apnea (adult) (pediatric): Secondary | ICD-10-CM | POA: Insufficient documentation

## 2021-09-12 DIAGNOSIS — G2581 Restless legs syndrome: Secondary | ICD-10-CM | POA: Diagnosis not present

## 2021-09-12 NOTE — Telephone Encounter (Signed)
LVMT pt requesting call back reference PREP referral.  

## 2021-09-14 NOTE — Procedures (Signed)
   Patient Name: [name], [name] Date: 09/12/2021 Gender: Female D.O.B: May 27, 1950 Age (years): 58 Referring Provider: Fransico Him MD, ABSM Height (inches): 59 Interpreting Physician: Fransico Him MD, ABSM Weight (lbs): 176 RPSGT: Jorge Ny BMI: 36 MRN: [id number] Neck Size: 15.00  CLINICAL INFORMATION The patient is referred for a CPAP titration to treat sleep apnea.  SLEEP STUDY TECHNIQUE As per the AASM Manual for the Scoring of Sleep and Associated Events v2.3 (April 2016) with a hypopnea requiring 4% desaturations.  The channels recorded and monitored were frontal, central and occipital EEG, electrooculogram (EOG), submentalis EMG (chin), nasal and oral airflow, thoracic and abdominal wall motion, anterior tibialis EMG, snore microphone, electrocardiogram, and pulse oximetry. Continuous positive airway pressure (CPAP) was initiated at the beginning of the study and titrated to treat sleep-disordered breathing.  MEDICATIONS Medications self-administered by patient taken the night of the study : N/A  TECHNICIAN COMMENTS Comments added by technician: Patient had difficulty initiating sleep. Patient was restless all through the night. Comments added by scorer: N/A  RESPIRATORY PARAMETERS Optimal PAP Pressure (cm): 16  AHI at Optimal Pressure (/hr):0 Overall Minimal O2 (%):87.0  Supine % at Optimal Pressure (%):N/A Minimal O2 at Optimal Pressure (%): 91.0   SLEEP ARCHITECTURE The study was initiated at 10:26:36 PM and ended at 4:31:15 AM.  Sleep onset time was 0.2 minutes and the sleep efficiency was 75.8%. The total sleep time was 276.5 minutes.  The patient spent 9.6% of the night in stage N1 sleep, 73.1% in stage N2 sleep, 0.0% in stage N3 and 17.4% in REM.Stage REM latency was 54.0 minutes  Wake after sleep onset was 87.9. Alpha intrusion was absent. Supine sleep was 61.12%.  CARDIAC DATA The 2 lead EKG demonstrated sinus rhythm. The mean heart rate  was 55.4 beats per minute. Other EKG findings include: PVCs.  LEG MOVEMENT DATA The total Periodic Limb Movements of Sleep (PLMS) were 0. The PLMS index was 0.0. A PLMS index of <15 is considered normal in adults.  IMPRESSIONS - An optimal PAP pressure of 16cm H2O was obtained. - Central sleep apnea was not noted during this titration (CAI = 0.9/h). - Mild oxygen desaturations were observed during this titration (min O2 = 87.0%). - The patient snored with soft snoring volume during this titration study. - 2-lead EKG demonstrated: PVCs - Clinically significant periodic limb movements were not noted during this study. Arousals associated with PLMs were significant. Patient has significant restless legs with leg pain resulting in difficulty maintaining REM sleep.  DIAGNOSIS - Obstructive Sleep Apnea (G47.33) - Restless Legs Syndrome  RECOMMENDATIONS - Recommend auto CPAP from 4 to 20cm H2O with ResMed Airtouch small full face mask and heated humidity. - Avoid alcohol, sedatives and other CNS depressants that may worsen sleep apnea and disrupt normal sleep architecture. - Sleep hygiene should be reviewed to assess factors that may improve sleep quality. - Weight management and regular exercise should be initiated or continued. - Return to [facility] Center for re-evaluation after 6 weeks of therapy - Consider treatment for patient's complaints of restless legs.  [Electronically signed] 09/14/2021 08:43 PM  Fransico Him MD, ABSM Diplomate, American Board of Sleep Medicine

## 2021-09-14 NOTE — Progress Notes (Unsigned)
Patient ID: [name]                 DOB: May 09, 1950                    MRN: [id number]     HPI: [name] is a 71 y.o. female patient referred to pharmacy clinic by Richardson Dopp to initiate weight loss therapy with GLP1-RA. PMH is significant for obesity, chest pain with cath 06/2021 showing normal coronaries, HTN, HLD, OSA, DM, and GERD. Most recent BMI 35.55.  Patient takes metformin and Farxiga for diabetes with last A1c of 7.  *** Follow-up visit  Assess % weight loss Assess adverse effects Missed doses  Current weight management medications:   Previously tried meds:  Current meds that may affect weight: Farxiga, metformin  Baseline weight/BMI:   Insurance payor:   Diet:  -Breakfast: -Lunch: -Dinner: -Snacks: -Drinks:  Exercise:   Family History:   Social History:   Labs: Lab Results  Component Value Date   HGBA1C 7.0 (H) 07/02/2021    Wt Readings from Last 1 Encounters:  09/12/21 176 lb (79.8 kg)    BP Readings from Last 1 Encounters:  09/09/21 108/64   Pulse Readings from Last 1 Encounters:  09/09/21 95       Component Value Date/Time   CHOL 145 01/21/2021 0851   TRIG 160.0 (H) 01/21/2021 0851   HDL 53.80 01/21/2021 0851   CHOLHDL 3 01/21/2021 0851   VLDL 32.0 01/21/2021 0851   LDLCALC 59 01/21/2021 0851   LDLDIRECT 142.0 01/28/2016 0929    Past Medical History:  Diagnosis Date   Acute pyelonephritis 05/02/2012   Asthma    Diverticulitis 03/24/2017   see report [facility] [place] [facility]   Dyspnea    nl PFTs, and echo   GERD (gastroesophageal reflux disease)    Headache(784.0)    Heart murmur    History of echocardiogram    Echocardiogram 7/23: EF 65-70, no RWMA, Gr 1 DD, normal RVSF, inadequate TR signal to est PASP, trivial MR, AV sclerosis w/o AS   History of kidney stones    IBS (irritable bowel syndrome)    Nephrolithiasis    Pre-diabetes 12/2013   Recurrent oral ulcers    Thyroid goiter    I 131 rx and then   total throidectomy 2005 [facility]    Current Outpatient Medications on File Prior to Visit  Medication Sig Dispense Refill   acetaminophen (TYLENOL) 500 MG tablet Take 1,000 mg by mouth 3 (three) times daily.     aspirin EC 81 MG tablet Take 1 tablet (81 mg total) by mouth daily. Swallow whole. 30 tablet 12   Blood Glucose Monitoring Suppl (FREESTYLE LITE) w/Device KIT USE TWO TIMES A DAY TO TEST GLUCOSE CONTROL 1 kit 0   dapagliflozin propanediol (FARXIGA) 10 MG TABS tablet Take 1 tablet (10 mg total) by mouth daily before breakfast. 90 tablet 1   FREESTYLE LITE test strip USE 1 STRIP TO TEST TWICE A DAY 50 strip 0   indapamide (LOZOL) 1.25 MG tablet Take 1 tablet (1.25 mg total) by mouth daily. 90 tablet 0   metFORMIN (GLUCOPHAGE-XR) 750 MG 24 hr tablet Take 1 tablet (750 mg total) by mouth daily with breakfast. 90 tablet 0   metoprolol succinate (TOPROL-XL) 50 MG 24 hr tablet Take 1 tablet (50 mg total) by mouth daily. Take with or immediately following a meal. (Patient taking differently: Take 25 mg by mouth at bedtime. Take with or  immediately following a meal.) 90 tablet 3   nitroGLYCERIN (NITROSTAT) 0.4 MG SL tablet Place 1 tablet (0.4 mg total) under the tongue every 5 (five) minutes as needed for chest pain. 25 tablet 3   olmesartan (BENICAR) 20 MG tablet TAKE ONE TABLET BY MOUTH DAILY 90 tablet 0   pantoprazole (PROTONIX) 40 MG tablet TAKE ONE TABLET BY MOUTH TWICE A DAY 180 tablet 0   rOPINIRole (REQUIP) 0.5 MG tablet TAKE 1 OR 2 TABLETS BY MOUTH DAILY AT NOON AND AT 5PM , THEN 3 TABLETS EVERY NIGHT AT BEDTIME 360 tablet 1   rosuvastatin (CRESTOR) 10 MG tablet TAKE ONE TABLET BY MOUTH DAILY 90 tablet 0   SYNTHROID 112 MCG tablet TAKE ONE TABLET BY MOUTH EVERY MORNING BEFORE BREAKFAST 90 tablet 0   No current facility-administered medications on file prior to visit.    Allergies  Allergen Reactions   Hydralazine Anaphylaxis, Anxiety, Dermatitis, Rash and Other (See Comments)     Flushing, Anxiety, Tremors    Ciprofloxacin Nausea Only   Propofol Other (See Comments)    Agitated, combative   Ritalin [Methylphenidate Hcl] Other (See Comments)    "climbed the walls"     Assessment/Plan:  1. Weight loss - Patient has not met goal of at least 5% of body weight loss with comprehensive lifestyle modifications alone in the past 3-6 months. Pharmacotherapy is appropriate to pursue as augmentation. Will start ***. Confirmed patient not ***pregnant and no personal or family history of medullary thyroid carcinoma (MTC) or Multiple Endocrine Neoplasia syndrome type 2 (MEN 2).   Advised patient on common side effects including nausea, diarrhea, dyspepsia, decreased appetite, and fatigue. Counseled patient on reducing meal size and how to titrate medication to minimize side effects. Counseled patient to call if intolerable side effects or if experiencing dehydration, abdominal pain, or dizziness. Patient will adhere to dietary modifications and will target at least 150 minutes of moderate intensity exercise weekly.   Injection technique reviewed at today's visit and patient successfully self-administered first dose of *** into the fatty tissue of the abdomen.  Titration Plan:  Will plan to follow the titration plan as below, pending patient is tolerating each dose before increasing to the next. Can slow titration if needed for tolerability.    -Month 1: Inject *** SQ once weekly x 4 weeks -Month 2: Inject *** SQ once weekly x 4 weeks -Month 3: Inject *** SQ once weekly x 4 weeks -Month 4+: Inject *** SQ once weekly   Follow up in ***.

## 2021-09-15 ENCOUNTER — Ambulatory Visit (INDEPENDENT_AMBULATORY_CARE_PROVIDER_SITE_OTHER): Payer: Medicare Other | Admitting: Pharmacist

## 2021-09-15 DIAGNOSIS — I200 Unstable angina: Secondary | ICD-10-CM

## 2021-09-15 DIAGNOSIS — E669 Obesity, unspecified: Secondary | ICD-10-CM

## 2021-09-15 DIAGNOSIS — E118 Type 2 diabetes mellitus with unspecified complications: Secondary | ICD-10-CM

## 2021-09-15 MED ORDER — SEMAGLUTIDE(0.25 OR 0.5MG/DOS) 2 MG/3ML ~~LOC~~ SOPN: 0.2500 mg | PEN_INJECTOR | SUBCUTANEOUS | 0 refills | Status: DC

## 2021-09-15 MED ORDER — SEMAGLUTIDE(0.25 OR 0.5MG/DOS) 2 MG/3ML ~~LOC~~ SOPN: PEN_INJECTOR | SUBCUTANEOUS | 0 refills | Status: DC

## 2021-09-15 NOTE — Patient Instructions (Signed)
We will inquire about cost from your mail order pharmacy and give you a call back. If Ozempic is affordable, we will plan the following titration schedule.  GLP1 Agonist Titration Plan:  Will plan to follow the titration plan as below, pending patient is tolerating each dose before increasing to the next. Can slow titration if needed for tolerability.   Ozempic:  -Month 1: Inject Ozempic 0.25 mg SQ once weekly x 4 weeks -Month 2: Inject Ozempic 0.5 mg  SQ once weekly x 4 weeks -Month 3: Inject Ozempic 1 mg SQ once weekly x 4 weeks -Month 4+: Inject Ozempic 2 mg SQ once weekly

## 2021-09-16 ENCOUNTER — Telehealth: Payer: Self-pay

## 2021-09-16 ENCOUNTER — Telehealth: Payer: Self-pay | Admitting: Cardiovascular Disease

## 2021-09-16 NOTE — Telephone Encounter (Signed)
Returned call to pt. States she spoke with the mail order pharmacy, they did not give her a price on the Ozempic but just referred her to pt assistance. She states she does get her [place] through pt assistance. Discussed that Ozempic pt assistance has had large delays and has not been a great long term option for many of our patients. She wishes to give them a call to pursue. I advised her to look into other Part D plans for next year, her plan has very bad coverage for Ozempic and any other branded medication ($505 deductible then 17% copay on Tier 3 meds which is ~$200/month for Ozempic and also makes her [place] unaffordable). She will look into this, can revisit Ozempic then if her coverage is better.

## 2021-09-16 NOTE — Telephone Encounter (Signed)
  Pt c/o medication issue:  1. Name of Medication: ozempic   2. How are you currently taking this medication (dosage and times per day)?   3. Are you having a reaction (difficulty breathing--STAT)?   4. What is your medication issue? Pt said, she returning call from [facility] regarding her new medication

## 2021-09-16 NOTE — Telephone Encounter (Signed)
[facility] pt yesterday afternoon requesting call back.  Called pt reference PREP this am.  Explained program. Would like to attend. Will need assist with paying for-can help with that.  Next class at [facility] is 10/20/21 at 1030a.  On M/Ws. Requested I send the information via email to her home email.  Will call her closer to the start of class to schedule/do intake interview.

## 2021-09-18 ENCOUNTER — Telehealth: Payer: Self-pay | Admitting: *Deleted

## 2021-09-18 NOTE — Telephone Encounter (Signed)
-----   Message from Antonieta Iba, [place] sent at 09/15/2021 11:08 AM EDT ----- Uvaldo Bristle,   It looks like based on her sleep study results she will need a CPAP titration. After the titration she will be set up with her CPAP machine and a follow up with Dr. Radford Pax will be made after that. Timing of appointment will depend on when she receives her machine.   Thanks! Carly ----- Message ----- From: Michaelyn Barter, RN Sent: 09/15/2021  10:51 AM EDT To: Freada Bergeron, CMA; Antonieta Iba, RN  This patient recently had a sleep study. Dr. Johnsie Cancel wanted to make sure she had follow up with Dr. Radford Pax. Please schedule.   Thank you, Pam, RN

## 2021-09-18 NOTE — Telephone Encounter (Signed)
Patient was scheduled for her titration and she rescheduled her test.

## 2021-09-24 ENCOUNTER — Encounter: Payer: Medicare Other | Admitting: Internal Medicine

## 2021-09-24 ENCOUNTER — Other Ambulatory Visit: Payer: Self-pay | Admitting: Internal Medicine

## 2021-09-24 DIAGNOSIS — I10 Essential (primary) hypertension: Secondary | ICD-10-CM

## 2021-09-25 NOTE — Telephone Encounter (Addendum)
The patient has been notified of the result and verbalized understanding.  All questions (if any) were answered. [name], [place] 09/25/2021 10:04 AM    Upon patient request DME selection is [facility]. Patient understands he will be contacted by [facility] to set up his cpap. Patient understands to call if [facility] does not contact him with new setup in a timely manner. Patient understands they will be called once confirmation has been received from [employer]/ that they have received their new machine to schedule 10 week follow up appointment.   [place] notified of new cpap order  Please add to airview Patient was grateful for the call and thanked me.

## 2021-09-25 NOTE — Telephone Encounter (Signed)
Patient called and filled out her section of the Ozempic patient assistance paperwork. Advised to bring to office and we can fill out provider section.

## 2021-10-01 ENCOUNTER — Telehealth (INDEPENDENT_AMBULATORY_CARE_PROVIDER_SITE_OTHER): Payer: Medicare Other | Admitting: Family Medicine

## 2021-10-01 ENCOUNTER — Encounter: Payer: Self-pay | Admitting: Family Medicine

## 2021-10-01 DIAGNOSIS — U071 COVID-19: Secondary | ICD-10-CM

## 2021-10-01 DIAGNOSIS — I200 Unstable angina: Secondary | ICD-10-CM

## 2021-10-01 MED ORDER — NIRMATRELVIR/RITONAVIR (PAXLOVID)TABLET: 3.0000 | ORAL_TABLET | Freq: Two times a day (BID) | ORAL | 0 refills | Status: AC

## 2021-10-01 NOTE — Progress Notes (Signed)
MyChart Video Visit    Virtual Visit via Video Note   This visit type was conducted due to national recommendations for restrictions regarding the COVID-19 Pandemic (e.g. social distancing) in an effort to limit this patient's exposure and mitigate transmission in our community. This patient is at least at moderate risk for complications without adequate follow up. This format is felt to be most appropriate for this patient at this time. Physical exam was limited by quality of the video and audio technology used for the visit. CMA was able to get the patient set up on a video visit.  Patient location: Home. Patient and provider in visit Provider location: Office  I discussed the limitations of evaluation and management by telemedicine and the availability of in person appointments. The patient expressed understanding and agreed to proceed.  Visit Date: 10/01/2021  Today's healthcare provider: Harland Dingwall, NP-C     Subjective:    Patient ID: [name], female    DOB: 10-Mar-1950, 71 y.o.   MRN: [id number]  Chief Complaint  Patient presents with   Covid Positive    9/6 Sx include: sore throat, cough and congestion/sinus pressure    HPI  Complains of 4 day hx of allergy symptoms, sore throat, sinus pain, and cough.  Tested positive for Covid today.   Denies fever, chills, dizziness, chest pain, palpitations, shortness of breath, abdominal pain, N/V/D.   Does not smoke. Sleep apnea.  Recent heart cath in the past month.   She has never had Covid and no Covid vaccines.   Past Medical History:  Diagnosis Date   Acute pyelonephritis 05/02/2012   Asthma    Diverticulitis 03/24/2017   see report [place] [facility]   Dyspnea    nl PFTs, and echo   GERD (gastroesophageal reflux disease)    Headache(784.0)    Heart murmur    History of echocardiogram    Echocardiogram 7/23: EF 65-70, no RWMA, Gr 1 DD, normal RVSF, inadequate TR signal to est PASP, trivial  MR, AV sclerosis w/o AS   History of kidney stones    IBS (irritable bowel syndrome)    Nephrolithiasis    Pre-diabetes 12/2013   Recurrent oral ulcers    Thyroid goiter    I 131 rx and then  total throidectomy 2005 [facility]    Past Surgical History:  Procedure Laterality Date   ABDOMINAL HYSTERECTOMY  1994   fibroid   APPENDECTOMY  1968   CYSTOSCOPY WITH STENT PLACEMENT Bilateral 03/24/2017   Procedure: BILATERAL URETERAL CATHETER  PLACEMENT;  Surgeon: Alexis Frock, MD;  [facility]: [facility];  Service: Urology;  Laterality: Bilateral;   CYSTOSCOPY/RETROGRADE/URETEROSCOPY  03/24/2017   Procedure: CYSTOSCOPY/RETROGRADE/URETEROSCOPY;  Surgeon: Alexis Frock, MD;  [facility];  Service: Urology;;   LAPAROSCOPIC PARTIAL COLECTOMY Left 03/24/2017   Procedure: LAPAROSCOPIC ASSISTED LEFT  SIGMOID COLECTOMY;  Surgeon: Alphonsa Overall, MD;  [facility]: [facility];  Service: General;  Laterality: Left;   orif left tivial plateau fracture     Dr. Collier Salina 2/08   plates and pins take out of left knee  06/2008   RIGHT/LEFT HEART CATH AND CORONARY ANGIOGRAPHY N/A 07/24/2021   Procedure: RIGHT/LEFT HEART CATH AND CORONARY ANGIOGRAPHY;  Surgeon: Troy Sine, MD;  Location: [place] [facility];  Service: Cardiovascular;  Laterality: N/A;   SIGMOIDOSCOPY  03/24/2017   Procedure: SIGMOIDOSCOPY;  Surgeon: Alphonsa Overall, MD;  Location: [facility];  Service: General;;   [place]  2005  for  large nodule 2006    Family History  Problem Relation Age of Onset   COPD Sister    Coronary artery disease Sister        age 36 also copd   Cancer Mother        bladder   Arthritis Other    Cancer Other        breast   Diabetes Other    Hyperlipidemia Other    Hypertension Other    Stroke Other    Heart disease Other    Coronary artery disease Father        also had AAA   Aneurysm Father        Aortic and thoracic fatal    Social History   Socioeconomic History    Marital status: Married    Spouse name: Not on file   Number of children: Not on file   Years of education: Not on file   Highest education level: Not on file  Occupational History   Not on file  Tobacco Use   Smoking status: Never   Smokeless tobacco: Never  Vaping Use   Vaping Use: Never used  Substance and Sexual Activity   Alcohol use: Yes    Comment: seldom   Drug use: No   Sexual activity: Not on file  Other Topics Concern   Not on file  Social History Narrative   Married   Has grandchildren   Never smoked    G4P4   hh of 2  care of GC ages 80 - 72 month  5 days a week   Social Determinants of Health   Financial Resource Strain: Low Risk  (06/26/2021)   Overall Financial Resource Strain (CARDIA)    Difficulty of Paying Living Expenses: Not hard at all  Food Insecurity: No Food Insecurity (06/26/2021)   Hunger Vital Sign    Worried About Running Out of Food in the Last Year: Never true    Ran Out of Food in the Last Year: Never true  Transportation Needs: No Transportation Needs (06/26/2021)   PRAPARE - Hydrologist (Medical): No    Lack of Transportation (Non-Medical): No  Physical Activity: Inactive (06/26/2021)   Exercise Vital Sign    Days of Exercise per Week: 0 days    Minutes of Exercise per Session: 0 min  Stress: No Stress Concern Present (06/26/2021)   [place]    Feeling of Stress : Not at all  Social Connections: [place] (06/26/2021)   Social Connection and Isolation Panel [NHANES]    Frequency of Communication with Friends and Family: More than three times a week    Frequency of Social Gatherings with Friends and Family: More than three times a week    Attends Religious Services: More than 4 times per year    Active Member of [employer] or Organizations: Yes    Attends Music therapist: More than 4 times per year    Marital Status: Married   Human resources officer Violence: Not At Risk (06/26/2021)   Humiliation, Afraid, Rape, and Kick questionnaire    Fear of Current or Ex-Partner: No    Emotionally Abused: No    Physically Abused: No    Sexually Abused: No    Outpatient Medications Prior to Visit  Medication Sig Dispense Refill   acetaminophen (TYLENOL) 500 MG tablet Take 1,000 mg by mouth 3 (three) times daily.  aspirin EC 81 MG tablet Take 1 tablet (81 mg total) by mouth daily. Swallow whole. 30 tablet 12   Blood Glucose Monitoring Suppl (FREESTYLE LITE) w/Device KIT USE TWO TIMES A DAY TO TEST GLUCOSE CONTROL 1 kit 0   dapagliflozin propanediol (FARXIGA) 10 MG TABS tablet Take 1 tablet (10 mg total) by mouth daily before breakfast. 90 tablet 1   FREESTYLE LITE test strip USE 1 STRIP TO TEST TWICE A DAY 50 strip 0   indapamide (LOZOL) 1.25 MG tablet TAKE 1 TABLET BY MOUTH DAILY 90 tablet 0   metFORMIN (GLUCOPHAGE-XR) 750 MG 24 hr tablet Take 1 tablet (750 mg total) by mouth daily with breakfast. 90 tablet 0   metoprolol succinate (TOPROL-XL) 50 MG 24 hr tablet Take 1 tablet (50 mg total) by mouth daily. Take with or immediately following a meal. (Patient taking differently: Take 25 mg by mouth at bedtime. Take with or immediately following a meal.) 90 tablet 3   nitroGLYCERIN (NITROSTAT) 0.4 MG SL tablet Place 1 tablet (0.4 mg total) under the tongue every 5 (five) minutes as needed for chest pain. 25 tablet 3   olmesartan (BENICAR) 20 MG tablet TAKE ONE TABLET BY MOUTH DAILY 90 tablet 0   pantoprazole (PROTONIX) 40 MG tablet TAKE ONE TABLET BY MOUTH TWICE A DAY 180 tablet 0   rOPINIRole (REQUIP) 0.5 MG tablet TAKE 1 OR 2 TABLETS BY MOUTH DAILY AT NOON AND AT 5PM , THEN 3 TABLETS EVERY NIGHT AT BEDTIME 360 tablet 1   rosuvastatin (CRESTOR) 10 MG tablet TAKE ONE TABLET BY MOUTH DAILY 90 tablet 0   SYNTHROID 112 MCG tablet TAKE ONE TABLET BY MOUTH EVERY MORNING BEFORE BREAKFAST 90 tablet 0   No facility-administered medications  prior to visit.    Allergies  Allergen Reactions   Hydralazine Anaphylaxis, Anxiety, Dermatitis, Rash and Other (See Comments)    Flushing, Anxiety, Tremors    Ciprofloxacin Nausea Only   Propofol Other (See Comments)    Agitated, combative   Ritalin [Methylphenidate Hcl] Other (See Comments)    "climbed the walls"    ROS     Objective:    Physical Exam  There were no vitals taken for this visit. Wt Readings from Last 3 Encounters:  09/12/21 176 lb (79.8 kg)  09/09/21 176 lb (79.8 kg)  08/05/21 178 lb (80.7 kg)   Alert and oriented in no acute distress.  Respirations unlabored.  She has hoarseness.  Speaking in complete sentences without difficulty.  Normal mood.    Assessment & Plan:   Problem List Items Addressed This Visit   None Visit Diagnoses     COVID-19 virus infection    -  Primary   Relevant Medications   nirmatrelvir/ritonavir EUA (PAXLOVID) 20 x 150 MG & 10 x $Re'100MG'mJx$  TABS      She is not in any acute distress.  Paxlovid prescribed.  This is her first time having COVID and she has never had a COVID-vaccine.  Counseling on how to take the medication and potential side effects.  Discussed symptomatic management.  Counseling on CDC guidelines for quarantine and isolation. Discussed red flag symptoms. Follow up for any concerns.   I am having [place] start on nirmatrelvir/ritonavir EUA. I am also having her maintain her acetaminophen, FreeStyle Lite, metFORMIN, dapagliflozin propanediol, Synthroid, rOPINIRole, rosuvastatin, olmesartan, pantoprazole, aspirin EC, nitroGLYCERIN, FREESTYLE LITE, metoprolol succinate, and indapamide.  Meds ordered this encounter  Medications   nirmatrelvir/ritonavir EUA (PAXLOVID) 20 x 150 MG &  10 x 100MG TABS    Sig: Take 3 tablets by mouth 2 (two) times daily for 5 days. (Take nirmatrelvir 150 mg two tablets twice daily for 5 days and ritonavir 100 mg one tablet twice daily for 5 days) Patient GFR is 61    Dispense:  30  tablet    Refill:  0    Order Specific Question:   Supervising Provider    Answer:   Pricilla Holm A [[id number]]    I discussed the assessment and treatment plan with the patient. The patient was provided an opportunity to ask questions and all were answered. The patient agreed with the plan and demonstrated an understanding of the instructions.   The patient was advised to call back or seek an in-person evaluation if the symptoms worsen or if the condition fails to improve as anticipated.  I provided 16 minutes of face-to-face time during this encounter.   Harland Dingwall, NP-C [facility] [place] [phone] (phone) [phone] (fax)  [place]

## 2021-10-04 ENCOUNTER — Other Ambulatory Visit: Payer: Self-pay | Admitting: Internal Medicine

## 2021-10-04 DIAGNOSIS — E118 Type 2 diabetes mellitus with unspecified complications: Secondary | ICD-10-CM

## 2021-10-06 ENCOUNTER — Telehealth: Payer: Self-pay

## 2021-10-06 NOTE — Telephone Encounter (Signed)
Call to pt reference PREP class starting on 10/20/21 MW 1030a-1145a-can do that date/time and location  Intake scheduled for 9/20 at 1030a at [address]. Information text sent per request.

## 2021-10-09 ENCOUNTER — Encounter (HOSPITAL_BASED_OUTPATIENT_CLINIC_OR_DEPARTMENT_OTHER): Payer: Medicare Other | Admitting: Cardiology

## 2021-10-15 NOTE — Progress Notes (Signed)
[facility] PREP Evaluation  Patient Details  Name: [name] MRN: [id number] Date of Birth: [birth year] Age: 71 y.o. PCP: Janith Lima, MD  Vitals:   10/15/21 1051  BP: 110/62  Pulse: 75  SpO2: 96%  Weight: 177 lb 9.6 oz (80.6 kg)     [facility] Eval - 10/15/21 1000       [facility] "PREP" Location   [facility] "PREP" Location [facility]      Referral    Referring Provider Kathlen Mody    Reason for referral Diabetes;Inactivity;Hypertension    Program Start Date 10/20/21   MW [id number] x 12 wks     Measurement   Waist Circumference 42 inches    Hip Circumference 46 inches    Body fat 47.9 percent      Information for Trainer   Goals Lose 50 lbs overall, 24 lbs for program    Current Exercise intermittent walking, yard work    Orthopedic Concerns Bil TKR still with knee pain, Osteopenia    Pertinent Medical History HTN, OSA -severe awaiting CPAP auth, Low [employer] LFTs, hx of throidectomy, DM2    Current Barriers none    Restrictions/Precautions Diabetic snack before exercise   no falls in last 6 months, no cane/walker   Medications that affect exercise Beta blocker;Medication causing dizziness/drowsiness      Timed Up and Go (TUGS)   Timed Up and Go Low risk <9 seconds      Mobility and Daily Activities   I find it easy to walk up or down two or more flights of stairs. 1    I have no trouble taking out the trash. 4    I do housework such as vacuuming and dusting on my own without difficulty. 4    I can easily lift a gallon of milk (8lbs). 4    I can easily walk a mile. 4    I have no trouble reaching into high cupboards or reaching down to pick up something from the floor. 4    I do not have trouble doing out-door work such as Armed forces logistics/support/administrative officer, raking leaves, or gardening. 4      Mobility and Daily Activities   I feel younger than my age. 4    I feel independent. 4    I feel energetic. 2    I live an active life.  4    I feel strong. 2    I feel healthy. 2    I feel  active as other people my age. 4      How fit and strong are you.   Fit and Strong Total Score 47            Past Medical History:  Diagnosis Date   Acute pyelonephritis 05/02/2012   Asthma    Diverticulitis 03/24/2017   see report [facility] [place] [facility]   Dyspnea    nl PFTs, and echo   GERD (gastroesophageal reflux disease)    Headache(784.0)    Heart murmur    History of echocardiogram    Echocardiogram 7/23: EF 65-70, no RWMA, Gr 1 DD, normal RVSF, inadequate TR signal to est PASP, trivial MR, AV sclerosis w/o AS   History of kidney stones    IBS (irritable bowel syndrome)    Nephrolithiasis    Pre-diabetes 12/2013   Recurrent oral ulcers    Thyroid goiter    I 131 rx and then  total throidectomy 2005 [facility]   Past Surgical History:  Procedure Laterality Date   ABDOMINAL HYSTERECTOMY  1994   fibroid   APPENDECTOMY  1968   CYSTOSCOPY WITH STENT PLACEMENT Bilateral 03/24/2017   Procedure: BILATERAL URETERAL CATHETER  PLACEMENT;  Surgeon: Alexis Frock, MD;  [facility];  Service: Urology;  Laterality: Bilateral;   CYSTOSCOPY/RETROGRADE/URETEROSCOPY  03/24/2017   Procedure: CYSTOSCOPY/RETROGRADE/URETEROSCOPY;  Surgeon: Alexis Frock, MD;  [facility];  Service: Urology;;   LAPAROSCOPIC PARTIAL COLECTOMY Left 03/24/2017   Procedure: LAPAROSCOPIC ASSISTED LEFT  SIGMOID COLECTOMY;  Surgeon: Alphonsa Overall, MD;  [facility];  Service: General;  Laterality: Left;   orif left tivial plateau fracture     Dr. Collier Salina 2/08   plates and pins take out of left knee  06/2008   RIGHT/LEFT HEART CATH AND CORONARY ANGIOGRAPHY N/A 07/24/2021   Procedure: RIGHT/LEFT HEART CATH AND CORONARY ANGIOGRAPHY;  Surgeon: Troy Sine, MD;  Location: [facility];  Service: Cardiovascular;  Laterality: N/A;   SIGMOIDOSCOPY  03/24/2017   Procedure: SIGMOIDOSCOPY;  Surgeon: Alphonsa Overall, MD;  [facility];  Service: General;;   [place]  2005   for  large nodule 2006   Social History   Tobacco Use  Smoking Status Never  Smokeless Tobacco Never    Barnett Hatter 10/15/2021, 10:57 AM

## 2021-10-16 ENCOUNTER — Other Ambulatory Visit: Payer: Self-pay | Admitting: Internal Medicine

## 2021-10-21 NOTE — Progress Notes (Signed)
[facility] PREP Weekly Session  Patient Details  Name: [name] MRN: [id number] Date of Birth: [birth year] Age: 71 y.o. PCP: Janith Lima, MD  There were no vitals filed for this visit.   [facility] Weekly seesion - 10/21/21 1200       [facility] "PREP" Location   [facility] "PREP" Location [facility]      Weekly Session   Topic Discussed Goal setting and welcome to the program    Classes attended to date [place] 10/21/2021, 12:08 PM

## 2021-10-28 ENCOUNTER — Telehealth: Payer: Self-pay | Admitting: Cardiology

## 2021-10-28 DIAGNOSIS — R0683 Snoring: Secondary | ICD-10-CM

## 2021-10-28 DIAGNOSIS — G4733 Obstructive sleep apnea (adult) (pediatric): Secondary | ICD-10-CM

## 2021-10-28 NOTE — Telephone Encounter (Signed)
What problem are you experiencing? Patient states that her mask makes it feel like she is suffocating. She is having a hard time wearing it at night, she says that she does have restless leg syndrome so she already has a hard time getting comfortable at night as well. She is scheduled for her sleep compliance appt on 12/24/21.  Who is your medical equipment company? [employer]   Please route to the sleep study assistant.

## 2021-10-28 NOTE — Progress Notes (Signed)
[facility] PREP Weekly Session  Patient Details  Name: [name] MRN: [id number] Date of Birth: Jan 03, 1951 Age: 71 y.o. PCP: Janith Lima, MD  Vitals:   10/27/21 1030  Weight: 176 lb 12.8 oz (80.2 kg)     [facility] Weekly seesion - 10/28/21 1300       [facility] "PREP" Location   [facility] "PREP" Location [facility]      Weekly Session   Topic Discussed Importance of resistance training;Other ways to be active    Minutes exercised this week 30 minutes    Classes attended to date [place] 10/28/2021, 1:49 PM

## 2021-10-30 NOTE — Telephone Encounter (Signed)
Patient has a Theatre stage manager full face mask.

## 2021-10-31 NOTE — Telephone Encounter (Signed)
Order placed to [employer] via community message 

## 2021-11-09 NOTE — Progress Notes (Signed)
[facility] PREP Weekly Session  Patient Details  Name: [name] MRN: [id number] Date of Birth: 21-May-1950 Age: 71 y.o. PCP: Janith Lima, MD  Vitals:   11/03/21 1030  Weight: 176 lb (79.8 kg)     [facility] Weekly seesion - 11/09/21 1800       [facility] "PREP" Location   [facility]P" [facility]      Weekly Session   Topic Discussed Healthy eating tips    Minutes exercised this week 300 minutes    Classes attended to date [place] 11/09/2021, 6:16 PM

## 2021-11-12 NOTE — Progress Notes (Signed)
[facility] PREP Weekly Session  Patient Details  Name: [name] MRN: [id number] Date of Birth: 1950/01/31 Age: 71 y.o. PCP: Janith Lima, MD  Vitals:   11/10/21 1030  Weight: 176 lb 6.4 oz (80 kg)     [facility] Weekly seesion - 11/12/21 1400       [facility] "PREP" Location   [facility]" [facility]      Weekly Session   Topic Discussed Health habits    Minutes exercised this week 240 minutes    Classes attended to date [place] 11/12/2021, 2:44 PM

## 2021-11-26 DIAGNOSIS — H25013 Cortical age-related cataract, bilateral: Secondary | ICD-10-CM | POA: Diagnosis not present

## 2021-11-26 DIAGNOSIS — E119 Type 2 diabetes mellitus without complications: Secondary | ICD-10-CM | POA: Diagnosis not present

## 2021-11-26 DIAGNOSIS — H35363 Drusen (degenerative) of macula, bilateral: Secondary | ICD-10-CM | POA: Diagnosis not present

## 2021-11-26 DIAGNOSIS — H2513 Age-related nuclear cataract, bilateral: Secondary | ICD-10-CM | POA: Diagnosis not present

## 2021-11-26 LAB — HM DIABETES EYE EXAM

## 2021-11-26 NOTE — Progress Notes (Signed)
[facility] PREP Weekly Session  Patient Details  Name: [name] MRN: [id number] Date of Birth: 11/14/1950 Age: 71 y.o. PCP: Janith Lima, MD  Vitals:   11/24/21 1030  Weight: 174 lb (78.9 kg)     [facility] Weekly seesion - 11/26/21 0900       [facility] "PREP" Location   [facility]P" [facility]      Weekly Session   Topic Discussed Stress management and problem solving    Minutes exercised this week 300 minutes    Classes attended to date 9            Chair yoga and meditation Barnett Hatter 11/26/2021, 10:00 AM

## 2021-12-01 ENCOUNTER — Other Ambulatory Visit: Payer: Self-pay | Admitting: Internal Medicine

## 2021-12-01 DIAGNOSIS — G2581 Restless legs syndrome: Secondary | ICD-10-CM

## 2021-12-03 NOTE — Progress Notes (Signed)
[facility] PREP Weekly Session  Patient Details  Name: [name] MRN: [id number] Date of Birth: 1950/05/11 Age: 71 y.o. PCP: Janith Lima, MD  Vitals:   12/01/21 1212  Weight: 172 lb 12.8 oz (78.4 kg)     [facility] Weekly seesion - 12/03/21 1200       [facility] "PREP" Location   [facility] "PREP" Location [facility]      Weekly Session   Topic Discussed Expectations and non-scale victories    Minutes exercised this week 420 minutes    Classes attended to date [place] 12/03/2021, 12:13 PM

## 2021-12-24 ENCOUNTER — Ambulatory Visit: Payer: Medicare Other | Attending: Cardiology | Admitting: Cardiology

## 2021-12-24 ENCOUNTER — Encounter: Payer: Self-pay | Admitting: Cardiology

## 2021-12-24 VITALS — BP 118/74 | HR 70 | Ht 59.5 in | Wt 172.8 lb

## 2021-12-24 DIAGNOSIS — I10 Essential (primary) hypertension: Secondary | ICD-10-CM | POA: Diagnosis not present

## 2021-12-24 DIAGNOSIS — I200 Unstable angina: Secondary | ICD-10-CM

## 2021-12-24 DIAGNOSIS — G4733 Obstructive sleep apnea (adult) (pediatric): Secondary | ICD-10-CM | POA: Diagnosis not present

## 2021-12-24 NOTE — Patient Instructions (Signed)
Medication Instructions:  Your physician recommends that you continue on your current medications as directed. Please refer to the Current Medication list given to you today.  *If you need a refill on your cardiac medications before your next appointment, please call your pharmacy*   Lab Work: None. If you have labs (blood work) drawn today and your tests are completely normal, you will receive your results only by: [place] (if you have MyChart) OR A paper copy in the mail If you have any lab test that is abnormal or we need to change your treatment, we will call you to review the results.   Testing/Procedures: None.   Follow-Up: At [facility], you and your health needs are our priority.  As part of our continuing mission to provide you with exceptional heart care, we have created designated Provider Care Teams.  These Care Teams include your primary Cardiologist (physician) and Advanced Practice Providers (APPs -  Physician Assistants and Nurse Practitioners) who all work together to provide you with the care you need, when you need it.  We recommend signing up for the patient portal called "MyChart".  Sign up information is provided on this After Visit Summary.  MyChart is used to connect with patients for Virtual Visits (Telemedicine).  Patients are able to view lab/test results, encounter notes, upcoming appointments, etc.  Non-urgent messages can be sent to your provider as well.   To learn more about what you can do with MyChart, go to [email or web address].    Your next appointment:   6 week(s)  The format for your next appointment:   In Person  Provider:   Dr. Golden Hurter, MD     Other Instructions We have ordered a nasal pillow mask and a chin strap for your CPAP machine.  Important Information About Sugar

## 2021-12-24 NOTE — Progress Notes (Signed)
Sleep Medicine CONSULT Note    Date:  12/24/2021   ID:  [name], DOB 10-27-1950, MRN [id number]  PCP:  Janith Lima, MD  Cardiologist: Jenkins Rouge, MD  Chief Complaint  Patient presents with   New Patient (Initial Visit)    OSA    History of Present Illness:  [name] is a 71 y.o. female who is being seen today for the evaluation of obstructive sleep apnea at the request of Jenkins Rouge, MD.  This is a 71 year old female with a history of prediabetes, heart murmur, mild pulmonary hypertension, GERD and shortness of breath.  She recently saw Richardson Dopp, PA and has been having chronic fatigue as well as shortness of breath and was found to have mild pulmonary hypertension on 2D echo.  Therefore a home sleep study was ordered which demonstrated severe obstructive sleep apnea with an AHI of 40.8/h and mild central sleep apnea with a central AHI of 11.3/h and nocturnal hypoxemia with O2 saturations less than 88% for 13.5 minutes with a nadir of 79%.  She underwent CPAP titration to 16 cm H2O and was ultimately set on auto CPAP from 4 to 20 cm H2O.  She is now referred for sleep evaluation.  She struggling using her device.  She has problems with feeling like she is smothering when she puts the mask on.  She has tried several different FFM and feels claustraphobic with the mask.  She has not tried a nasal mask.  She is a mouth breather so she will need to have a chin strap if we switch to a nasal mask.   She feels the pressure is adequate.   Past Medical History:  Diagnosis Date   Acute pyelonephritis 05/02/2012   Asthma    Diverticulitis 03/24/2017   see report [facility] [place] [facility]   Dyspnea    nl PFTs, and echo   GERD (gastroesophageal reflux disease)    Headache(784.0)    Heart murmur    History of echocardiogram    Echocardiogram 7/23: EF 65-70, no RWMA, Gr 1 DD, normal RVSF, inadequate TR signal to est PASP, trivial MR, AV sclerosis w/o AS   History of  kidney stones    IBS (irritable bowel syndrome)    Nephrolithiasis    Pre-diabetes 12/2013   Recurrent oral ulcers    Thyroid goiter    I 131 rx and then  total throidectomy 2005 [facility]    Past Surgical History:  Procedure Laterality Date   ABDOMINAL HYSTERECTOMY  1994   fibroid   APPENDECTOMY  1968   CYSTOSCOPY WITH STENT PLACEMENT Bilateral 03/24/2017   Procedure: BILATERAL URETERAL CATHETER  PLACEMENT;  Surgeon: Alexis Frock, MD;  Location: [facility];  Service: Urology;  Laterality: Bilateral;   CYSTOSCOPY/RETROGRADE/URETEROSCOPY  03/24/2017   Procedure: CYSTOSCOPY/RETROGRADE/URETEROSCOPY;  Surgeon: Alexis Frock, MD;  Location: [facility];  Service: Urology;;   LAPAROSCOPIC PARTIAL COLECTOMY Left 03/24/2017   Procedure: LAPAROSCOPIC ASSISTED LEFT  SIGMOID COLECTOMY;  Surgeon: Alphonsa Overall, MD;  Location: [facility];  Service: General;  Laterality: Left;   orif left tivial plateau fracture     Dr. Collier Salina 2/08   plates and pins take out of left knee  06/2008   RIGHT/LEFT HEART CATH AND CORONARY ANGIOGRAPHY N/A 07/24/2021   Procedure: RIGHT/LEFT HEART CATH AND CORONARY ANGIOGRAPHY;  Surgeon: Troy Sine, MD;  Location: [facility];  Service: Cardiovascular;  Laterality: N/A;   SIGMOIDOSCOPY  03/24/2017   Procedure: SIGMOIDOSCOPY;  Surgeon: Lucia Gaskins,  Shanon Brow, MD;  Location: [facility];  Service: General;;   [place]  2005   for  large nodule 2006    Current Medications: Current Meds  Medication Sig   acetaminophen (TYLENOL) 500 MG tablet Take 1,000 mg by mouth 3 (three) times daily.   aspirin EC 81 MG tablet Take 1 tablet (81 mg total) by mouth daily. Swallow whole.   Blood Glucose Monitoring Suppl (FREESTYLE LITE) w/Device KIT USE TWO TIMES A DAY TO TEST GLUCOSE CONTROL   dapagliflozin propanediol (FARXIGA) 10 MG TABS tablet Take 1 tablet (10 mg total) by mouth daily before breakfast.   FREESTYLE LITE test strip USE 1 STRIP TO TEST TWICE A DAY    indapamide (LOZOL) 1.25 MG tablet TAKE 1 TABLET BY MOUTH DAILY   metFORMIN (GLUCOPHAGE-XR) 750 MG 24 hr tablet TAKE ONE TABLET BY MOUTH DAILY WITH BREAKFAST   nitroGLYCERIN (NITROSTAT) 0.4 MG SL tablet Place 1 tablet (0.4 mg total) under the tongue every 5 (five) minutes as needed for chest pain.   olmesartan (BENICAR) 20 MG tablet TAKE 1 TABLET BY MOUTH DAILY   pantoprazole (PROTONIX) 40 MG tablet TAKE 1 TABLET BY MOUTH TWICE A DAY   rOPINIRole (REQUIP) 0.5 MG tablet TAKE 1 OR  2 TABLETS BY MOUTH DAILY AT NOON AND AT 5PM , THEN 3 TABLETS EVERY NIGHT AT BEDTIME   rosuvastatin (CRESTOR) 10 MG tablet TAKE 1 TABLET BY MOUTH DAILY   SYNTHROID 112 MCG tablet TAKE ONE TABLET BY MOUTH EVERY MORNING BEFORE BREAKFAST    Allergies:   Hydralazine, Ciprofloxacin, Propofol, and Ritalin [methylphenidate hcl]   Social History   Socioeconomic History   Marital status: Married    Spouse name: Not on file   Number of children: Not on file   Years of education: Not on file   Highest education level: Not on file  Occupational History   Not on file  Tobacco Use   Smoking status: Never   Smokeless tobacco: Never  Vaping Use   Vaping Use: Never used  Substance and Sexual Activity   Alcohol use: Yes    Comment: seldom   Drug use: No   Sexual activity: Not on file  Other Topics Concern   Not on file  Social History Narrative   Married   Has grandchildren   Never smoked    G4P4   hh of 2  care of GC ages 91 - 73 month  5 days a week   Social Determinants of Health   Financial Resource Strain: Low Risk  (06/26/2021)   Overall Financial Resource Strain (CARDIA)    Difficulty of Paying Living Expenses: Not hard at all  Food Insecurity: No Food Insecurity (06/26/2021)   Hunger Vital Sign    Worried About Running Out of Food in the Last Year: Never true    Ran Out of Food in the Last Year: Never true  Transportation Needs: No Transportation Needs (06/26/2021)   PRAPARE - Radiographer, therapeutic (Medical): No    Lack of Transportation (Non-Medical): No  Physical Activity: Inactive (06/26/2021)   Exercise Vital Sign    Days of Exercise per Week: 0 days    Minutes of Exercise per Session: 0 min  Stress: No Stress Concern Present (06/26/2021)   [place]    Feeling of Stress : Not at all  Social Connections: [place] (06/26/2021)   Social Connection and  Isolation Panel [NHANES]    Frequency of Communication with Friends and Family: More than three times a week    Frequency of Social Gatherings with Friends and Family: More than three times a week    Attends Religious Services: More than 4 times per year    Active Member of [employer] or Organizations: Yes    Attends Music therapist: More than 4 times per year    Marital Status: Married     Family History:  The patient's family history includes Aneurysm in her father; Arthritis in an other family member; COPD in her sister; Cancer in her mother and another family member; Coronary artery disease in her father and sister; Diabetes in an other family member; Heart disease in an other family member; Hyperlipidemia in an other family member; Hypertension in an other family member; Stroke in an other family member.   ROS:   Please see the history of present illness.    ROS All other systems reviewed and are negative.      No data to display             PHYSICAL EXAM:   VS:  BP 118/74   Pulse 70   Ht 4' 11.5" (1.511 m)   Wt 172 lb 12.8 oz (78.4 kg)   SpO2 92%   BMI 34.32 kg/m    GEN: Well nourished, well developed, in no acute distress  HEENT: normal  Neck: no JVD, carotid bruits, or masses Cardiac: RRR; no murmurs, rubs, or gallops,no edema.  Intact distal pulses bilaterally.  Respiratory:  clear to auscultation bilaterally, normal work of breathing GI: soft, nontender, nondistended, + BS MS: no deformity or atrophy  Skin:  warm and dry, no rash Neuro:  Alert and Oriented x 3, Strength and sensation are intact Psych: euthymic mood, full affect  Wt Readings from Last 3 Encounters:  12/24/21 172 lb 12.8 oz (78.4 kg)  12/01/21 172 lb 12.8 oz (78.4 kg)  11/24/21 174 lb (78.9 kg)      Studies/Labs Reviewed:   Home sleep study, CPAP titration, PAP compliance download  Recent Labs: 06/30/2021: TSH 3.48 07/22/2021: NT-Pro BNP <36; Platelets 249 07/24/2021: Hemoglobin 13.6 08/05/2021: BUN 27; Creatinine, Ser 0.99; Magnesium 2.1; Potassium 4.2; Sodium 140    Additional studies/ records that were reviewed today include:  None    ASSESSMENT:    1. OSA (obstructive sleep apnea)   2. Primary hypertension      PLAN:  In order of problems listed above:  OSA  -she has mild OSA but significant nocturnal hypoxemia -she has been intolerant to the FFM due to claustraphobia and has not really been using her device -I am going to try a nasal pillow mask with chin strap to see if she is able to tolerate it.  She may need a mask desensitization. -she is a very poor sleeper and only sleeps an hour and wakes up and watches TV and takes a hot bath and then goes back to sleep and sleeps another hour and this goes on throughout the night.  -she has RLS and takes Ropinirole which used to help but does not help as much anymore -we may need to consider a sleep aide -I will see her back in 6 weeks to see how she is doing  2.  Hypertension -Continue prescription drug management with indapamide 1.25 mg daily, olmesartan 20 mg daily with as needed refills   Time Spent: 20 minutes total time of encounter, including 15  minutes spent in face-to-face patient care on the date of this encounter. This time includes coordination of care and counseling regarding above mentioned problem list. Remainder of non-face-to-face time involved reviewing chart documents/testing relevant to the patient encounter and documentation in the medical  record. I have independently reviewed documentation from referring provider  Medication Adjustments/Labs and Tests Ordered: Current medicines are reviewed at length with the patient today.  Concerns regarding medicines are outlined above.  Medication changes, Labs and Tests ordered today are listed in the Patient Instructions below.  There are no Patient Instructions on file for this visit.   Signed, Fransico Him, MD  12/24/2021 8:17 AM    [address], [place], [place]  [address] Phone: [phone]; Fax: [phone]

## 2021-12-26 ENCOUNTER — Telehealth: Payer: Self-pay | Admitting: *Deleted

## 2021-12-26 DIAGNOSIS — I272 Pulmonary hypertension, unspecified: Secondary | ICD-10-CM

## 2021-12-26 DIAGNOSIS — G4733 Obstructive sleep apnea (adult) (pediatric): Secondary | ICD-10-CM

## 2021-12-26 DIAGNOSIS — R0683 Snoring: Secondary | ICD-10-CM

## 2021-12-26 NOTE — Telephone Encounter (Signed)
-----   Message from Joni Reining, RN sent at 12/24/2021  8:40 AM EST ----- Regarding: Nasal pillow mask and chin strap Hi Nina,  Dr. Radford Pax wants to order a nasal pillow mask and chin strap for this patient.  Thanks! Danae Chen, RN, BSN, [employer]

## 2021-12-26 NOTE — Telephone Encounter (Signed)
Order placed to [facility] via community message. 

## 2022-01-06 ENCOUNTER — Ambulatory Visit (INDEPENDENT_AMBULATORY_CARE_PROVIDER_SITE_OTHER): Payer: Medicare Other | Admitting: Internal Medicine

## 2022-01-06 ENCOUNTER — Encounter: Payer: Self-pay | Admitting: Internal Medicine

## 2022-01-06 VITALS — BP 132/62 | HR 67 | Temp 98.3°F | Resp 16 | Ht 59.5 in | Wt 170.0 lb

## 2022-01-06 DIAGNOSIS — E785 Hyperlipidemia, unspecified: Secondary | ICD-10-CM | POA: Insufficient documentation

## 2022-01-06 DIAGNOSIS — I200 Unstable angina: Secondary | ICD-10-CM

## 2022-01-06 DIAGNOSIS — N1831 Chronic kidney disease, stage 3a: Secondary | ICD-10-CM | POA: Diagnosis not present

## 2022-01-06 DIAGNOSIS — I739 Peripheral vascular disease, unspecified: Secondary | ICD-10-CM | POA: Insufficient documentation

## 2022-01-06 DIAGNOSIS — E039 Hypothyroidism, unspecified: Secondary | ICD-10-CM

## 2022-01-06 DIAGNOSIS — I10 Essential (primary) hypertension: Secondary | ICD-10-CM

## 2022-01-06 DIAGNOSIS — G2581 Restless legs syndrome: Secondary | ICD-10-CM

## 2022-01-06 DIAGNOSIS — E118 Type 2 diabetes mellitus with unspecified complications: Secondary | ICD-10-CM | POA: Diagnosis not present

## 2022-01-06 LAB — HEPATIC FUNCTION PANEL
ALT: 18 U/L (ref 0–35)
AST: 17 U/L (ref 0–37)
Albumin: 4.6 g/dL (ref 3.5–5.2)
Alkaline Phosphatase: 42 U/L (ref 39–117)
Bilirubin, Direct: 0.2 mg/dL (ref 0.0–0.3)
Total Bilirubin: 1.2 mg/dL (ref 0.2–1.2)
Total Protein: 7.1 g/dL (ref 6.0–8.3)

## 2022-01-06 LAB — LIPID PANEL
Cholesterol: 144 mg/dL (ref 0–200)
HDL: 50 mg/dL (ref >39.00)
LDL Cholesterol: 55 mg/dL (ref 0–99)
NonHDL: 93.83
Total CHOL/HDL Ratio: 3
Triglycerides: 193 mg/dL — ABNORMAL HIGH (ref 0.0–149.0)
VLDL: 38.6 mg/dL (ref 0.0–40.0)

## 2022-01-06 LAB — BASIC METABOLIC PANEL
BUN: 31 mg/dL — ABNORMAL HIGH (ref 6–23)
CO2: 28 mEq/L (ref 19–32)
Calcium: 9.7 mg/dL (ref 8.4–10.5)
Chloride: 101 mEq/L (ref 96–112)
Creatinine, Ser: 0.97 mg/dL (ref 0.40–1.20)
GFR: 58.81 mL/min — ABNORMAL LOW (ref >60.00)
Glucose, Bld: 137 mg/dL — ABNORMAL HIGH (ref 70–99)
Potassium: 4.1 mEq/L (ref 3.5–5.1)
Sodium: 137 mEq/L (ref 135–145)

## 2022-01-06 LAB — HEMOGLOBIN A1C: Hgb A1c MFr Bld: 7.3 % — ABNORMAL HIGH (ref 4.6–6.5)

## 2022-01-06 LAB — TSH: TSH: 1.5 u[IU]/mL (ref 0.35–5.50)

## 2022-01-06 MED ORDER — ROSUVASTATIN CALCIUM 10 MG PO TABS: 10.0000 mg | ORAL_TABLET | Freq: Every day | ORAL | 1 refills | Status: DC

## 2022-01-06 MED ORDER — OLMESARTAN MEDOXOMIL 20 MG PO TABS: 20.0000 mg | ORAL_TABLET | Freq: Every day | ORAL | 1 refills | Status: DC

## 2022-01-06 MED ORDER — DAPAGLIFLOZIN PROPANEDIOL 10 MG PO TABS: 10.0000 mg | ORAL_TABLET | Freq: Every day | ORAL | 1 refills | Status: DC

## 2022-01-06 MED ORDER — SYNTHROID 112 MCG PO TABS: 112.0000 ug | ORAL_TABLET | Freq: Every day | ORAL | 1 refills | Status: DC

## 2022-01-06 MED ORDER — ROPINIROLE HCL 0.5 MG PO TABS: ORAL_TABLET | ORAL | 1 refills | Status: DC

## 2022-01-06 MED ORDER — METFORMIN HCL ER 750 MG PO TB24: 750.0000 mg | ORAL_TABLET | Freq: Every day | ORAL | 1 refills | Status: DC

## 2022-01-06 NOTE — Patient Instructions (Signed)

## 2022-01-06 NOTE — Progress Notes (Signed)
Subjective:  Patient ID: [name], female    DOB: 1950/09/15  Age: 71 y.o. MRN: [id number]  CC: Hypertension, Diabetes, Hypothyroidism, and Hyperlipidemia   HPI [place] presents for f/up -  She is active and denies chest pain, shortness of breath, diaphoresis, or edema.  She has chronic fatigue. She refused a flu vax.  Outpatient Medications Prior to Visit  Medication Sig Dispense Refill   acetaminophen (TYLENOL) 500 MG tablet Take 1,000 mg by mouth 3 (three) times daily.     aspirin EC 81 MG tablet Take 1 tablet (81 mg total) by mouth daily. Swallow whole. 30 tablet 12   Blood Glucose Monitoring Suppl (FREESTYLE LITE) w/Device KIT USE TWO TIMES A DAY TO TEST GLUCOSE CONTROL 1 kit 0   FREESTYLE LITE test strip USE 1 STRIP TO TEST TWICE A DAY 50 strip 0   pantoprazole (PROTONIX) 40 MG tablet TAKE 1 TABLET BY MOUTH TWICE A DAY 180 tablet 0   dapagliflozin propanediol (FARXIGA) 10 MG TABS tablet Take 1 tablet (10 mg total) by mouth daily before breakfast. 90 tablet 1   indapamide (LOZOL) 1.25 MG tablet TAKE 1 TABLET BY MOUTH DAILY 90 tablet 0   metFORMIN (GLUCOPHAGE-XR) 750 MG 24 hr tablet TAKE ONE TABLET BY MOUTH DAILY WITH BREAKFAST 90 tablet 0   olmesartan (BENICAR) 20 MG tablet TAKE 1 TABLET BY MOUTH DAILY 90 tablet 0   rOPINIRole (REQUIP) 0.5 MG tablet TAKE 1 OR  2 TABLETS BY MOUTH DAILY AT NOON AND AT 5PM , THEN 3 TABLETS EVERY NIGHT AT BEDTIME 360 tablet 0   rosuvastatin (CRESTOR) 10 MG tablet TAKE 1 TABLET BY MOUTH DAILY 90 tablet 0   SYNTHROID 112 MCG tablet TAKE ONE TABLET BY MOUTH EVERY MORNING BEFORE BREAKFAST 90 tablet 0   nitroGLYCERIN (NITROSTAT) 0.4 MG SL tablet Place 1 tablet (0.4 mg total) under the tongue every 5 (five) minutes as needed for chest pain. 25 tablet 3   No facility-administered medications prior to visit.    ROS Review of Systems  Constitutional:  Positive for fatigue. Negative for appetite change, diaphoresis and unexpected weight  change.  HENT: Negative.    Eyes: Negative.   Respiratory:  Positive for apnea. Negative for cough, chest tightness, shortness of breath and wheezing.   Cardiovascular:  Negative for chest pain, palpitations and leg swelling.  Gastrointestinal:  Negative for abdominal pain, diarrhea, nausea and vomiting.  Endocrine: Negative.   Genitourinary: Negative.  Negative for difficulty urinating.  Musculoskeletal:  Negative for arthralgias and myalgias.  Skin: Negative.   Allergic/Immunologic: Negative.   Neurological: Negative.  Negative for dizziness, weakness and light-headedness.  Hematological:  Negative for adenopathy. Does not bruise/bleed easily.  Psychiatric/Behavioral:  Positive for sleep disturbance. Negative for confusion. The patient is not nervous/anxious.     Objective:  BP 132/62 (BP Location: Right Arm, Patient Position: Sitting, Cuff Size: Large)   Pulse 67   Temp 98.3 F (36.8 C) (Oral)   Resp 16   Ht 4' 11.5" (1.511 m)   Wt 170 lb (77.1 kg)   SpO2 93%   BMI 33.76 kg/m   BP Readings from Last 3 Encounters:  01/06/22 132/62  12/24/21 118/74  10/15/21 110/62    Wt Readings from Last 3 Encounters:  01/06/22 170 lb (77.1 kg)  12/24/21 172 lb 12.8 oz (78.4 kg)  12/01/21 172 lb 12.8 oz (78.4 kg)    Physical Exam Vitals reviewed.  HENT:     Mouth/Throat:  Mouth: Mucous membranes are moist.  Eyes:     General: No scleral icterus.    Conjunctiva/sclera: Conjunctivae normal.  Cardiovascular:     Rate and Rhythm: Normal rate and regular rhythm.     Heart sounds: Murmur heard.     Systolic murmur is present with a grade of 1/6.     No diastolic murmur is present.     No friction rub. No gallop.  Pulmonary:     Effort: Pulmonary effort is normal.     Breath sounds: No stridor. No wheezing, rhonchi or rales.  Abdominal:     General: Abdomen is flat.     Palpations: There is no mass.     Tenderness: There is no abdominal tenderness. There is no guarding.      Hernia: No hernia is present.  Musculoskeletal:     Cervical back: Neck supple. No tenderness.     Right lower leg: No edema.     Left lower leg: No edema.  Lymphadenopathy:     Cervical: No cervical adenopathy.  Skin:    General: Skin is warm and dry.  Neurological:     General: No focal deficit present.  Psychiatric:        Mood and Affect: Mood normal.        Behavior: Behavior normal.     Lab Results  Component Value Date   WBC 4.9 07/22/2021   HGB 13.6 07/24/2021   HCT 40.0 07/24/2021   PLT 249 07/22/2021   GLUCOSE 137 (H) 01/06/2022   CHOL 144 01/06/2022   TRIG 193.0 (H) 01/06/2022   HDL 50.00 01/06/2022   LDLDIRECT 142.0 01/28/2016   LDLCALC 55 01/06/2022   ALT 18 01/06/2022   AST 17 01/06/2022   NA 137 01/06/2022   K 4.1 01/06/2022   CL 101 01/06/2022   CREATININE 0.97 01/06/2022   BUN 31 (H) 01/06/2022   CO2 28 01/06/2022   TSH 1.50 01/06/2022   INR 1.12 03/17/2017   HGBA1C 7.3 (H) 01/06/2022   MICROALBUR 1.2 06/30/2021    NM Pulmonary Perfusion  Result Date: 08/09/2021 CLINICAL DATA:  Chest pain and dyspnea on exertion. EXAM: NUCLEAR MEDICINE PERFUSION LUNG SCAN TECHNIQUE: Perfusion images were obtained in multiple projections after intravenous injection of radiopharmaceutical. Ventilation scans intentionally deferred if perfusion scan and chest x-ray adequate for interpretation during COVID 19 epidemic. RADIOPHARMACEUTICALS:  4.0 mCi Tc-76mMAA IV COMPARISON:  Chest radiograph dated 08/08/2021. FINDINGS: There is uniform uptake of radiotracer throughout the lungs. No which shaped perfusion defect. IMPRESSION: Normal perfusion scan. Electronically Signed   By: AAnner CreteM.D.   On: 08/09/2021 23:28   DG Chest 2 View  Result Date: 08/09/2021 CLINICAL DATA:  Shortness of breath, palpitations EXAM: CHEST - 2 VIEW COMPARISON:  10/05/2020 FINDINGS: Frontal and lateral views of the chest demonstrate an unremarkable cardiac silhouette. No airspace disease,  effusion, or pneumothorax. No acute bony abnormalities. IMPRESSION: 1. No acute intrathoracic process. Electronically Signed   By: MRanda NgoM.D.   On: 08/09/2021 21:54    Assessment & Plan:   VEritreawas seen today for hypertension, diabetes, hypothyroidism and hyperlipidemia.  Diagnoses and all orders for this visit:  Primary hypertension- Her blood pressure is well-controlled. -     Basic metabolic panel; Future -     Basic metabolic panel -     olmesartan (BENICAR) 20 MG tablet; Take 1 tablet (20 mg total) by mouth daily.  Acquired hypothyroidism- She is euthyroid. -  TSH; Future -     TSH -     SYNTHROID 112 MCG tablet; Take 1 tablet (112 mcg total) by mouth daily with breakfast.  Type II diabetes mellitus with manifestations ([address])- Her blood sugar is adequately well-controlled. -     Hemoglobin A1c; Future -     HM Diabetes Foot Exam -     Hemoglobin A1c -     dapagliflozin propanediol (FARXIGA) 10 MG TABS tablet; Take 1 tablet (10 mg total) by mouth daily before breakfast. -     metFORMIN (GLUCOPHAGE-XR) 750 MG 24 hr tablet; Take 1 tablet (750 mg total) by mouth daily with breakfast.  Dyslipidemia, goal LDL below 70- LDL goal achieved. Doing well on the statin  -     Lipid panel; Future -     Hepatic function panel; Future -     Hepatic function panel -     Lipid panel -     rosuvastatin (CRESTOR) 10 MG tablet; Take 1 tablet (10 mg total) by mouth daily.  RLS (restless legs syndrome) -     rOPINIRole (REQUIP) 0.5 MG tablet; TAKE 1 OR  2 TABLETS BY MOUTH DAILY AT NOON AND AT 5PM , THEN 3 TABLETS EVERY NIGHT AT BEDTIME  Claudication of both lower extremities (HCC) -     VAS [place] ABI WITH/WO TBI; Future -     Discontinue: rivaroxaban (XARELTO) 2.5 MG TABS tablet; Take 1 tablet (2.5 mg total) by mouth 2 (two) times daily.  Stage 3a chronic kidney disease ([place])- Will continue the SGLT2 inhibitor. -     dapagliflozin propanediol (FARXIGA) 10 MG TABS tablet; Take 1  tablet (10 mg total) by mouth daily before breakfast.  PAD (peripheral artery disease) ([place])- Will add Xarelto to reduce the risk of complications. -     Discontinue: rivaroxaban (XARELTO) 2.5 MG TABS tablet; Take 1 tablet (2.5 mg total) by mouth 2 (two) times daily. -     rivaroxaban (XARELTO) 2.5 MG TABS tablet; Take 1 tablet (2.5 mg total) by mouth 2 (two) times daily.   I have discontinued [place]'s indapamide and rivaroxaban. I have also changed her rosuvastatin, Synthroid, olmesartan, and metFORMIN. Additionally, I am having her start on rivaroxaban. Lastly, I am having her maintain her acetaminophen, FreeStyle Lite, aspirin EC, nitroGLYCERIN, FREESTYLE LITE, pantoprazole, rOPINIRole, and dapagliflozin propanediol.  Meds ordered this encounter  Medications   rOPINIRole (REQUIP) 0.5 MG tablet    Sig: TAKE 1 OR  2 TABLETS BY MOUTH DAILY AT NOON AND AT 5PM , THEN 3 TABLETS EVERY NIGHT AT BEDTIME    Dispense:  360 tablet    Refill:  1   rosuvastatin (CRESTOR) 10 MG tablet    Sig: Take 1 tablet (10 mg total) by mouth daily.    Dispense:  90 tablet    Refill:  1   SYNTHROID 112 MCG tablet    Sig: Take 1 tablet (112 mcg total) by mouth daily with breakfast.    Dispense:  90 tablet    Refill:  1   dapagliflozin propanediol (FARXIGA) 10 MG TABS tablet    Sig: Take 1 tablet (10 mg total) by mouth daily before breakfast.    Dispense:  90 tablet    Refill:  1   olmesartan (BENICAR) 20 MG tablet    Sig: Take 1 tablet (20 mg total) by mouth daily.    Dispense:  90 tablet    Refill:  1   metFORMIN (GLUCOPHAGE-XR) 750 MG 24  hr tablet    Sig: Take 1 tablet (750 mg total) by mouth daily with breakfast.    Dispense:  90 tablet    Refill:  1   DISCONTD: rivaroxaban (XARELTO) 2.5 MG TABS tablet    Sig: Take 1 tablet (2.5 mg total) by mouth 2 (two) times daily.    Dispense:  180 tablet    Refill:  1   rivaroxaban (XARELTO) 2.5 MG TABS tablet    Sig: Take 1 tablet (2.5 mg total) by  mouth 2 (two) times daily.    Dispense:  140 tablet    Refill:  0     Follow-up: Return in about 6 months (around 07/08/2022).  Scarlette Calico, MD

## 2022-01-07 ENCOUNTER — Ambulatory Visit (HOSPITAL_COMMUNITY)
Admission: RE | Admit: 2022-01-07 | Discharge: 2022-01-07 | Disposition: A | Discharge status: Home / Self Care | Payer: Medicare Other | Source: Ambulatory Visit | Attending: Vascular Surgery | Admitting: Vascular Surgery

## 2022-01-07 DIAGNOSIS — I739 Peripheral vascular disease, unspecified: Secondary | ICD-10-CM | POA: Insufficient documentation

## 2022-01-07 MED ORDER — RIVAROXABAN 2.5 MG PO TABS: 2.5000 mg | ORAL_TABLET | Freq: Two times a day (BID) | ORAL | 1 refills | Status: DC

## 2022-01-09 ENCOUNTER — Telehealth: Payer: Self-pay | Admitting: Internal Medicine

## 2022-01-09 NOTE — Telephone Encounter (Signed)
PT visits today in regards to price on new rivaroxaban (XARELTO) 2.5 MG TABS tablet RX. PT had spoke with their insurance and was directed to patient assistance. She has set up everything on her end to receive the assistance and all they are requiring is the RX to be sent to a mail order pharmacy.  They are suggesting it be sent to the [facility]   Phone number for [facility]: [phone]  Fax for [facility]: [phone]  Also PT was wanting to know if we are still carrying samples of this RX?

## 2022-01-10 ENCOUNTER — Encounter: Payer: Self-pay | Admitting: Internal Medicine

## 2022-01-10 MED ORDER — RIVAROXABAN 2.5 MG PO TABS: 2.5000 mg | ORAL_TABLET | Freq: Two times a day (BID) | ORAL | 0 refills | Status: DC

## 2022-01-13 ENCOUNTER — Telehealth: Payer: Self-pay | Admitting: Internal Medicine

## 2022-01-13 DIAGNOSIS — H04123 Dry eye syndrome of bilateral lacrimal glands: Secondary | ICD-10-CM | POA: Diagnosis not present

## 2022-01-13 DIAGNOSIS — H25813 Combined forms of age-related cataract, bilateral: Secondary | ICD-10-CM | POA: Diagnosis not present

## 2022-01-13 DIAGNOSIS — H02201 Unspecified lagophthalmos right upper eyelid: Secondary | ICD-10-CM | POA: Diagnosis not present

## 2022-01-13 DIAGNOSIS — H18593 Other hereditary corneal dystrophies, bilateral: Secondary | ICD-10-CM | POA: Diagnosis not present

## 2022-01-13 NOTE — Progress Notes (Signed)
[facility] PRE[facility] Evaluation  Patient Details  Name: [name] MRN: [id number] Date of Birth: 16-Apr-1950 Age: 71 y.o. PCP: Janith Lima, MD  Vitals:   01/12/22 1130  BP: 100/68  Pulse: 70  SpO2: 95%  Weight: 172 lb 6.4 oz (78.2 kg)     [facility] Eval - 01/13/22 1200       [facility] "PREP" Location   [facility] "[facility]" Location [facility]      Referral    Referring Provider [employer]    Program Start Date --   final class date 01/07/22     Measurement   Waist Circumference 38.5 inches    Hip Circumference 40 inches    Body fat 45.3 percent      Mobility and Daily Activities   I find it easy to walk up or down two or more flights of stairs. 3    I have no trouble taking out the trash. 4    I do housework such as vacuuming and dusting on my own without difficulty. 4    I can easily lift a gallon of milk (8lbs). 4    I can easily walk a mile. 4    I have no trouble reaching into high cupboards or reaching down to pick up something from the floor. 4    I do not have trouble doing out-door work such as Armed forces logistics/support/administrative officer, raking leaves, or gardening. 4      Mobility and Daily Activities   I feel younger than my age. 3    I feel independent. 4    I feel energetic. 2    I live an active life.  4    I feel strong. 3    I feel healthy. 3    I feel active as other people my age. 4      How fit and strong are you.   Fit and Strong Total Score 50            Past Medical History:  Diagnosis Date   Acute pyelonephritis 05/02/2012   Asthma    Diverticulitis 03/24/2017   see report [place] [facility]   Dyspnea    nl PFTs, and echo   GERD (gastroesophageal reflux disease)    Headache(784.0)    Heart murmur    History of echocardiogram    Echocardiogram 7/23: EF 65-70, no RWMA, Gr 1 DD, normal RVSF, inadequate TR signal to est PASP, trivial MR, AV sclerosis w/o AS   History of kidney stones    IBS (irritable bowel syndrome)    Nephrolithiasis    Pre-diabetes 12/2013    Recurrent oral ulcers    Thyroid goiter    I 131 rx and then  total throidectomy 2005 [facility]   Past Surgical History:  Procedure Laterality Date   ABDOMINAL HYSTERECTOMY  1994   fibroid   APPENDECTOMY  1968   CYSTOSCOPY WITH STENT PLACEMENT Bilateral 03/24/2017   Procedure: BILATERAL URETERAL CATHETER  PLACEMENT;  Surgeon: Alexis Frock, MD;  Location: [facility];  Service: Urology;  Laterality: Bilateral;   CYSTOSCOPY/RETROGRADE/URETEROSCOPY  03/24/2017   Procedure: CYSTOSCOPY/RETROGRADE/URETEROSCOPY;  Surgeon: Alexis Frock, MD;  Location: [facility];  Service: Urology;;   LAPAROSCOPIC PARTIAL COLECTOMY Left 03/24/2017   Procedure: LAPAROSCOPIC ASSISTED LEFT  SIGMOID COLECTOMY;  Surgeon: Alphonsa Overall, MD;  Location: [facility];  Service: General;  Laterality: Left;   orif left tivial plateau fracture     Dr. Collier Salina 2/08   plates and pins take out of  left knee  06/2008   RIGHT/LEFT HEART CATH AND CORONARY ANGIOGRAPHY N/A 07/24/2021   Procedure: RIGHT/LEFT HEART CATH AND CORONARY ANGIOGRAPHY;  Surgeon: Troy Sine, MD;  Location: [facility];  Service: Cardiovascular;  Laterality: N/A;   SIGMOIDOSCOPY  03/24/2017   Procedure: SIGMOIDOSCOPY;  Surgeon: Alphonsa Overall, MD;  Location: [facility];  Service: General;;   [place]  2005   for  large nodule 2006   Social History   Tobacco Use  Smoking Status Never  Smokeless Tobacco Never  Attended 12 total sessions, 7 of the 12 educational sessions Fit testing:  Cardio march: 247 to 274 Sit to stand: 15 to 14 Bicep curl: 20 to 19 Balance improved. Wobbly with single leg raise.  Encouraged to keep exercising. Has new dx of PAD per patient and has started Eliquis. Encouraged to keep walking to keep circulation moving (is a Actuary for work). Still struggling with sleep-has follow up for a new mask (pillow) for CPAP.   Barnett Hatter 01/13/2022, 12:35 PM

## 2022-01-13 NOTE — Telephone Encounter (Signed)
Patient is calling because she was going through her after visit summary from her last visit and had concerns in where the paragraph was contradicting, stating that Dr. Ronnald Ramp had changed her medications then prescribes the samething she was already taking. It was for four different medications; Synthroid 139mg, Rosuvastatin (Crestor) '10mg'$ , Metformin (Glucophage-XR) '750mg'$ , and Olmesartan (Benicar) '20mg'$ . Patient wants to know what changed, if any, were done, if she is still supposed to take those medications or if she should be taking something else. Patient would like a callback to discuss at ([phone]

## 2022-01-14 ENCOUNTER — Other Ambulatory Visit: Payer: Self-pay | Admitting: Internal Medicine

## 2022-01-14 DIAGNOSIS — E039 Hypothyroidism, unspecified: Secondary | ICD-10-CM

## 2022-01-14 DIAGNOSIS — E785 Hyperlipidemia, unspecified: Secondary | ICD-10-CM

## 2022-01-14 DIAGNOSIS — I10 Essential (primary) hypertension: Secondary | ICD-10-CM

## 2022-01-14 NOTE — Telephone Encounter (Signed)
Pt has been informed to stay on medications. Pt stated understanding.

## 2022-01-15 ENCOUNTER — Telehealth: Payer: Self-pay | Admitting: Internal Medicine

## 2022-01-15 ENCOUNTER — Other Ambulatory Visit: Payer: Self-pay | Admitting: Internal Medicine

## 2022-01-15 DIAGNOSIS — I10 Essential (primary) hypertension: Secondary | ICD-10-CM

## 2022-01-15 MED ORDER — INDAPAMIDE 1.25 MG PO TABS: 1.2500 mg | ORAL_TABLET | Freq: Every day | ORAL | 0 refills | Status: DC

## 2022-01-15 NOTE — Telephone Encounter (Signed)
Patient called and said that Dr Ronnald Ramp had taken her off the fluid pill but that her legs have started to swell again and she wanted to know if she needs to go back on it. She said if he does want her to that she would need a refill because she is out. Please call back at [phone] to let know. If sent in please send to [facility]

## 2022-02-04 ENCOUNTER — Other Ambulatory Visit: Payer: Self-pay | Admitting: Internal Medicine

## 2022-02-04 DIAGNOSIS — G2581 Restless legs syndrome: Secondary | ICD-10-CM

## 2022-02-06 ENCOUNTER — Ambulatory Visit: Payer: Medicare Other | Attending: Cardiology | Admitting: Cardiology

## 2022-02-25 NOTE — Progress Notes (Signed)
Cardiology Office Note:    Date:  03/04/2022   ID:  [name], DOB 1950/12/15, MRN [id number]  PCP:  Janith Lima, MD  [facility] Cardiologist:  Jenkins Rouge, MD     Referring MD: Janith Lima, MD   Chief Complaint:  No chief complaint on file.    Patient Profile: Chest pain  Cath 06/2021: normal coronary arteries  Mild pulmonary hypertension  RHC 06/2021: mean PAP 30 Hypertension  Hyperlipidemia  Hypothyroidism  Diverticulitis s/p partial colectomy in 2019 Diabetes mellitus GERD  Sleep apnea  Prior CV Studies: ECHO COMPLETE WO IMAGING ENHANCING AGENT 08/06/2021 EF 65-70, no RWMA, Gr 1 DD, normal RVSF, TR signal not adequate to assess PAP, trivial MR, AV sclerosis w/o AS    VQ scan 08/09/2021 Normal perfusion scan  CARDIAC CATHETERIZATION 07/24/2021 Normal coronary arteries EF 55-65 // LVEDP 12; Mild pulmonary hypertension, mean PAP 30   ZIO MONITOR 3 DAYS Patient had a min HR of 44 bpm, max HR of 131 bpm, and avg HR of 72 bpm. Predominant underlying rhythm was Sinus Rhythm.  PACs <1%, PVCs 2%    GATED SPECT MYO PERF W/LEXISCAN STRESS 1D 02/07/2018 EF 63, normal perfusion, low risk   ECHO COMPLETE WO IMAGING ENHANCING AGENT 03/22/2017 EF 60-65, PASP 33    CPET 03/06/2013 Normal functional capacity, no clear circulatory or ventilatory limitation with exercise  History of Present Illness:   [name] is a 72 y.o. female with the above problem list. Cath with no CAD and normal left sided filling pressures Right heart catheterization evidence of mild pulmonary hypertension.  Her echocardiogram demonstrated normal LV function and normal RVSF.  PA pressure could not be adequately assessed on the echocardiogram.  VQ scan was normal.  Sleep study did demonstrate Lin sleep apnea.  She is now seeing Dr Radford Pax for this She had mildly abnormal ABI's 0.90 on 01/07/22 and primary put her on low dose Xarelto Wave forms all multiphasic   Her  leg pain is not vascular improved with walking and worse with prolonged sitting Discussed having a statin holiday for 4 weeks     Past Medical History:  Diagnosis Date   Acute pyelonephritis 05/02/2012   Asthma    Diverticulitis 03/24/2017   see report [facility] [place] [facility]   Dyspnea    nl PFTs, and echo   GERD (gastroesophageal reflux disease)    Headache(784.0)    Heart murmur    History of echocardiogram    Echocardiogram 7/23: EF 65-70, no RWMA, Gr 1 DD, normal RVSF, inadequate TR signal to est PASP, trivial MR, AV sclerosis w/o AS   History of kidney stones    IBS (irritable bowel syndrome)    Nephrolithiasis    Pre-diabetes 12/2013   Recurrent oral ulcers    Thyroid goiter    I 131 rx and then  total throidectomy 2005 [facility]   Current Medications: Current Meds  Medication Sig   acetaminophen (TYLENOL) 500 MG tablet Take 1,000 mg by mouth 3 (three) times daily.   aspirin EC 81 MG tablet Take 1 tablet (81 mg total) by mouth daily. Swallow whole.   Blood Glucose Monitoring Suppl (FREESTYLE LITE) w/Device KIT USE TWO TIMES A DAY TO TEST GLUCOSE CONTROL   dapagliflozin propanediol (FARXIGA) 10 MG TABS tablet Take 1 tablet (10 mg total) by mouth daily before breakfast.   FREESTYLE LITE test strip USE 1 STRIP TO TEST TWICE A DAY   indapamide (LOZOL) 1.25 MG tablet Take  1 tablet (1.25 mg total) by mouth daily.   metFORMIN (GLUCOPHAGE-XR) 750 MG 24 hr tablet Take 1 tablet (750 mg total) by mouth daily with breakfast.   nitroGLYCERIN (NITROSTAT) 0.4 MG SL tablet Place 1 tablet (0.4 mg total) under the tongue every 5 (five) minutes as needed for chest pain.   olmesartan (BENICAR) 20 MG tablet Take 1 tablet (20 mg total) by mouth daily.   pantoprazole (PROTONIX) 40 MG tablet TAKE 1 TABLET BY MOUTH TWICE A DAY   rivaroxaban (XARELTO) 2.5 MG TABS tablet Take 1 tablet (2.5 mg total) by mouth 2 (two) times daily.   rOPINIRole (REQUIP) 0.5 MG tablet TAKE 1 OR  2 TABLETS BY MOUTH DAILY  AT NOON AND AT 5PM , THEN 3 TABLETS EVERY NIGHT AT BEDTIME   rosuvastatin (CRESTOR) 10 MG tablet Take 1 tablet (10 mg total) by mouth daily.   SYNTHROID 112 MCG tablet Take 1 tablet (112 mcg total) by mouth daily with breakfast.    Allergies:   Hydralazine, Ciprofloxacin, Propofol, and Ritalin [methylphenidate hcl]   Social History   Tobacco Use   Smoking status: Never   Smokeless tobacco: Never  Vaping Use   Vaping Use: Never used  Substance Use Topics   Alcohol use: Yes    Comment: seldom   Drug use: No    Family Hx: The patient's family history includes Aneurysm in her father; Arthritis in an other family member; COPD in her sister; Cancer in her mother and another family member; Coronary artery disease in her father and sister; Diabetes in an other family member; Heart disease in an other family member; Hyperlipidemia in an other family member; Hypertension in an other family member; Stroke in an other family member.  ROS - See HPI  EKGs/Labs/Other Test Reviewed:    EKG:    Recent Labs: 07/22/2021: NT-Pro BNP <36; Platelets 249 07/24/2021: Hemoglobin 13.6 08/05/2021: Magnesium 2.1 01/06/2022: ALT 18; BUN 31; Creatinine, Ser 0.97; Potassium 4.1; Sodium 137; TSH 1.50   Recent Lipid Panel Recent Labs    01/06/22 0856  CHOL 144  TRIG 193.0*  HDL 50.00  VLDL 38.6  LDLCALC 55     Risk Assessment/Calculations/Metrics:     STOP-Bang Score:  5           Physical Exam:    VS:  BP 126/70   Pulse 62   Ht 4' 11.5" (1.511 m)   Wt 179 lb (81.2 kg)   SpO2 97%   BMI 35.55 kg/m     Wt Readings from Last 3 Encounters:  03/04/22 179 lb (81.2 kg)  01/12/22 172 lb 6.4 oz (78.2 kg)  01/06/22 170 lb (77.1 kg)     Affect appropriate Healthy:  appears stated age HEENT: normal Neck supple with no adenopathy JVP normal no bruits no thyromegaly Lungs clear with no wheezing and good diaphragmatic motion Heart:  S1/S2 no murmur, no rub, gallop or click PMI normal Abdomen:  benighn, BS positve, no tenderness, no AAA no bruit.  No HSM or HJR Distal pulses intact with no bruits No edema Neuro non-focal Skin warm and dry No muscular weakness      ASSESSMENT & PLAN:    OSA:  Lin f/u Dr Radford Pax for CPAP and titration likely responsible for mild pulmonary HTN noted at cath In regard to dyspnea EF normal on TTE 08/06/21 no signs of cor pulmonale V/Q negative and no significant valve ds Chest Pain:  non cardiac normal cors on cath 07/24/21  DM:  Discussed low carb diet.  Target hemoglobin A1c is 6.5 or less.  Continue current medications. HTN:  Well controlled.  Continue current medications and low sodium Dash type diet.   HLD:  Hold statin for 4 weeks LDL 55 01/06/22  Thyroid:  on synthroid replacement TSH normal 01/06/22  PVD:  very mild with resting ABI's 0.90 01/07/22 and multi phasic waveforms TBI:s abnormal On low dose xarelto per primary She is on Requip for restless leg statin holiday for pain but this is not claudication    Medication Adjustments/Labs and Tests Ordered:  Hold Crestor for 4 weeks and call  F/U in 6 months   Tests Ordered: No orders of the defined types were placed in this encounter.  Medication Changes: No orders of the defined types were placed in this encounter.  Signed, Jenkins Rouge, MD  03/04/2022 9:11 AM    [facility] [place], [place], [name]  [address] Phone: [phone]; Fax: [phone]

## 2022-03-04 ENCOUNTER — Encounter: Payer: Self-pay | Admitting: Cardiovascular Disease

## 2022-03-04 ENCOUNTER — Ambulatory Visit: Payer: Medicare Other | Attending: Cardiovascular Disease | Admitting: Cardiovascular Disease

## 2022-03-04 VITALS — BP 126/70 | HR 62 | Ht 59.5 in | Wt 179.0 lb

## 2022-03-04 DIAGNOSIS — I10 Essential (primary) hypertension: Secondary | ICD-10-CM | POA: Insufficient documentation

## 2022-03-04 DIAGNOSIS — E7800 Pure hypercholesterolemia, unspecified: Secondary | ICD-10-CM | POA: Insufficient documentation

## 2022-03-04 DIAGNOSIS — I739 Peripheral vascular disease, unspecified: Secondary | ICD-10-CM | POA: Insufficient documentation

## 2022-03-04 DIAGNOSIS — R002 Palpitations: Secondary | ICD-10-CM

## 2022-03-04 DIAGNOSIS — G4733 Obstructive sleep apnea (adult) (pediatric): Secondary | ICD-10-CM

## 2022-03-04 NOTE — Patient Instructions (Signed)
Medication Instructions:  Please stop your crestor for 4 weeks.   *If you need a refill on your cardiac medications before your next appointment, please call your pharmacy*   Lab Work: None.  If you have labs (blood work) drawn today and your tests are completely normal, you will receive your results only by: [place] (if you have MyChart) OR A paper copy in the mail If you have any lab test that is abnormal or we need to change your treatment, we will call you to review the results.   Testing/Procedures: None.   Follow-Up: At [facility], you and your health needs are our priority.  As part of our continuing mission to provide you with exceptional heart care, we have created designated Provider Care Teams.  These Care Teams include your primary Cardiologist (physician) and Advanced Practice Providers (APPs -  Physician Assistants and Nurse Practitioners) who all work together to provide you with the care you need, when you need it.  We recommend signing up for the patient portal called "MyChart".  Sign up information is provided on this After Visit Summary.  MyChart is used to connect with patients for Virtual Visits (Telemedicine).  Patients are able to view lab/test results, encounter notes, upcoming appointments, etc.  Non-urgent messages can be sent to your provider as well.   To learn more about what you can do with MyChart, go to [email or web address].    Your next appointment:   6 month(s)  Provider:   Jenkins Rouge, MD

## 2022-03-10 ENCOUNTER — Telehealth: Payer: Self-pay | Admitting: Internal Medicine

## 2022-03-10 NOTE — Telephone Encounter (Signed)
Patient called and asked if there were any samples of rivaroxaban (XARELTO) 2.5 MG TABS tablet Dr. Ronnald Ramp could get for her. She said it is too expensive and would like to know if there is a different medication she could take. Best callback number is [phone].

## 2022-03-10 NOTE — Telephone Encounter (Signed)
LVM stating that samples are ready for pick up at the front desk.

## 2022-04-02 DIAGNOSIS — H04123 Dry eye syndrome of bilateral lacrimal glands: Secondary | ICD-10-CM | POA: Diagnosis not present

## 2022-04-02 DIAGNOSIS — D23121 Other benign neoplasm of skin of left upper eyelid, including canthus: Secondary | ICD-10-CM | POA: Diagnosis not present

## 2022-04-08 ENCOUNTER — Other Ambulatory Visit: Payer: Self-pay | Admitting: Internal Medicine

## 2022-04-08 DIAGNOSIS — Z1231 Encounter for screening mammogram for malignant neoplasm of breast: Secondary | ICD-10-CM | POA: Diagnosis not present

## 2022-04-08 DIAGNOSIS — I10 Essential (primary) hypertension: Secondary | ICD-10-CM

## 2022-04-08 LAB — HM MAMMOGRAPHY

## 2022-04-09 ENCOUNTER — Encounter: Payer: Self-pay | Admitting: Internal Medicine

## 2022-04-09 ENCOUNTER — Other Ambulatory Visit: Payer: Self-pay | Admitting: Internal Medicine

## 2022-05-06 ENCOUNTER — Telehealth: Payer: Self-pay | Admitting: Internal Medicine

## 2022-05-06 DIAGNOSIS — H25813 Combined forms of age-related cataract, bilateral: Secondary | ICD-10-CM | POA: Diagnosis not present

## 2022-05-06 DIAGNOSIS — E039 Hypothyroidism, unspecified: Secondary | ICD-10-CM

## 2022-05-06 DIAGNOSIS — H25812 Combined forms of age-related cataract, left eye: Secondary | ICD-10-CM | POA: Diagnosis not present

## 2022-05-06 DIAGNOSIS — D23121 Other benign neoplasm of skin of left upper eyelid, including canthus: Secondary | ICD-10-CM | POA: Diagnosis not present

## 2022-05-06 DIAGNOSIS — H04123 Dry eye syndrome of bilateral lacrimal glands: Secondary | ICD-10-CM | POA: Diagnosis not present

## 2022-05-06 LAB — HM DIABETES EYE EXAM

## 2022-05-06 MED ORDER — SYNTHROID 112 MCG PO TABS: 112.0000 ug | ORAL_TABLET | Freq: Every day | ORAL | 0 refills | Status: DC

## 2022-05-06 NOTE — Telephone Encounter (Signed)
Fax number is [phone] for [facility]. The address is [address], [place], [place] [address].  Patient would like a callback when it is sent.

## 2022-05-06 NOTE — Telephone Encounter (Signed)
Notified pt rx sent to [facility] pharmacy.Marland KitchenRaechel Chute

## 2022-05-06 NOTE — Telephone Encounter (Signed)
Pt wanted Dr Yetta Barre to know [facility] pharmacy will be sending over a request for Synthroid

## 2022-06-09 ENCOUNTER — Other Ambulatory Visit: Payer: Self-pay | Admitting: Internal Medicine

## 2022-06-09 DIAGNOSIS — G2581 Restless legs syndrome: Secondary | ICD-10-CM

## 2022-06-10 ENCOUNTER — Telehealth: Payer: Self-pay | Admitting: Internal Medicine

## 2022-06-10 ENCOUNTER — Other Ambulatory Visit: Payer: Self-pay | Admitting: Internal Medicine

## 2022-06-10 DIAGNOSIS — G2581 Restless legs syndrome: Secondary | ICD-10-CM

## 2022-06-10 MED ORDER — ROPINIROLE HCL 0.5 MG PO TABS: ORAL_TABLET | ORAL | 0 refills | Status: DC

## 2022-06-10 NOTE — Telephone Encounter (Signed)
RX sent We have samples

## 2022-06-10 NOTE — Telephone Encounter (Signed)
Patient has been scheduled for next available appointment on 06/24/2022. She would like to know if she can get a partial refill of rOPINIRole (REQUIP) 0.5 MG tablet to last her until that appointment. She would like for it to be sent to [facility] [id number] [facility] Otto, [employer] - [address]  Patient also said she gets samples of rivaroxaban (XARELTO) 2.5 MG TABS tablet . She would like to know if she can get more for her to pick up from the office.  Best callback is [phone].

## 2022-06-15 ENCOUNTER — Encounter: Payer: Self-pay | Admitting: Family Medicine

## 2022-06-15 ENCOUNTER — Telehealth: Payer: Self-pay | Admitting: Internal Medicine

## 2022-06-15 ENCOUNTER — Ambulatory Visit (INDEPENDENT_AMBULATORY_CARE_PROVIDER_SITE_OTHER): Payer: Medicare Other | Admitting: Family Medicine

## 2022-06-15 ENCOUNTER — Ambulatory Visit (HOSPITAL_BASED_OUTPATIENT_CLINIC_OR_DEPARTMENT_OTHER)
Admission: RE | Admit: 2022-06-15 | Discharge: 2022-06-15 | Disposition: A | Discharge status: Home / Self Care | Payer: Medicare Other | Source: Ambulatory Visit | Attending: Family Medicine | Admitting: Family Medicine

## 2022-06-15 VITALS — BP 124/60 | HR 66 | Temp 98.0°F | Resp 20 | Ht 59.1 in | Wt 181.0 lb

## 2022-06-15 DIAGNOSIS — R079 Chest pain, unspecified: Secondary | ICD-10-CM | POA: Diagnosis not present

## 2022-06-15 DIAGNOSIS — R0602 Shortness of breath: Secondary | ICD-10-CM

## 2022-06-15 DIAGNOSIS — R0789 Other chest pain: Secondary | ICD-10-CM | POA: Diagnosis not present

## 2022-06-15 DIAGNOSIS — I493 Ventricular premature depolarization: Secondary | ICD-10-CM

## 2022-06-15 DIAGNOSIS — J9811 Atelectasis: Secondary | ICD-10-CM | POA: Diagnosis not present

## 2022-06-15 LAB — POCT I-STAT CREATININE: Creatinine, Ser: 1.1 mg/dL — ABNORMAL HIGH (ref 0.44–1.00)

## 2022-06-15 MED ORDER — IOHEXOL 350 MG/ML SOLN
100.0000 mL | Freq: Once | INTRAVENOUS | Status: AC | PRN
  Administered 2022-06-15: 75 mL via INTRAVENOUS

## 2022-06-15 MED ORDER — METHOCARBAMOL 500 MG PO TABS: 500.0000 mg | ORAL_TABLET | Freq: Three times a day (TID) | ORAL | 0 refills | Status: DC | PRN

## 2022-06-15 NOTE — Telephone Encounter (Signed)
Addressed on CT result.

## 2022-06-15 NOTE — Telephone Encounter (Signed)
Pt called back again since this note was taken earlier today - she wanted her results.   Aware the NO PE was seen and NO cardiopulmon disease seen   Patient wants to know what this pain is from and what can be done about it - she wants a call back TODAY   Aware that Brayton El was still in with a patient and that we would call her back before EOD, today.

## 2022-06-15 NOTE — Progress Notes (Signed)
Assessment & Plan:  1-2. Other chest pain/Shortness of breath - EKG 12-Lead - CT Angio Chest Pulmonary Embolism (PE) W or WO Contrast; Future  3. PVC's (premature ventricular contractions) Discussed I am not comfortable starting her back on metoprolol since her baseline heartrate is in the 60s. She will need to discuss with cardiology. Education provided on PVCs.    Follow up plan: Return if symptoms worsen or fail to improve.  Deliah Boston, MSN, APRN, FNP-C  Subjective:  HPI: [name] is a 72 y.o. female presenting on 06/15/2022 for right side chest pain (Started last night about 9 pm - pain went into back, increased belching. )  Patient is here due to right-sided chest pain that is going into her back on the right side.  The pain started last night around 9 PM.  She describes it as a nagging ache.  It is accompanied by shortness of breath.  She states she feels funny and not good.  She has experienced some belching and indigestion, but this is not new; she takes pantoprazole for this.  Denies nausea, vomiting, and diarrhea.  She does not sleep a lot.  She has OSA and does not wear her CPAP as she feels like it smothers her.  Denies tobacco use, alcohol use, illegal drug use, caffeine use, and stress.  She reports her heart rate is sometimes over 100, but most of the time in the 60s and 70s.  She was once on metoprolol, but no longer takes it.   ROS: Negative unless specifically indicated above in HPI.   Relevant past medical history reviewed and updated as indicated.   Allergies and medications reviewed and updated.   Current Outpatient Medications:    acetaminophen (TYLENOL) 500 MG tablet, Take 1,000 mg by mouth 3 (three) times daily., Disp: , Rfl:    aspirin EC 81 MG tablet, Take 1 tablet (81 mg total) by mouth daily. Swallow whole., Disp: 30 tablet, Rfl: 12   Blood Glucose Monitoring Suppl (FREESTYLE LITE) w/Device KIT, USE TWO TIMES A DAY TO TEST GLUCOSE CONTROL, Disp:  1 kit, Rfl: 0   dapagliflozin propanediol (FARXIGA) 10 MG TABS tablet, Take 1 tablet (10 mg total) by mouth daily before breakfast., Disp: 90 tablet, Rfl: 1   FREESTYLE LITE test strip, USE 1 STRIP TO TEST TWICE A DAY, Disp: 50 strip, Rfl: 0   indapamide (LOZOL) 1.25 MG tablet, Take 1 tablet (1.25 mg total) by mouth daily. Follow-up appt due in June must see MD for future refills, Disp: 90 tablet, Rfl: 0   olmesartan (BENICAR) 20 MG tablet, Take 1 tablet (20 mg total) by mouth daily., Disp: 90 tablet, Rfl: 1   pantoprazole (PROTONIX) 40 MG tablet, TAKE 1 TABLET BY MOUTH TWICE A DAY, Disp: 180 tablet, Rfl: 0   rivaroxaban (XARELTO) 2.5 MG TABS tablet, Take 1 tablet (2.5 mg total) by mouth 2 (two) times daily., Disp: 140 tablet, Rfl: 0   rOPINIRole (REQUIP) 0.5 MG tablet, TAKE 1 OR  2 TABLETS BY MOUTH DAILY AT NOON AND AT 5PM , THEN 3 TABLETS EVERY NIGHT AT BEDTIME, Disp: 360 tablet, Rfl: 0   SYNTHROID 112 MCG tablet, Take 1 tablet (112 mcg total) by mouth daily with breakfast. Annual appt due in June must see provider for future refills, Disp: 90 tablet, Rfl: 0   metFORMIN (GLUCOPHAGE-XR) 750 MG 24 hr tablet, Take 1 tablet (750 mg total) by mouth daily with breakfast. (Patient not taking: Reported on 06/15/2022), Disp: 90 tablet,  Rfl: 1   nitroGLYCERIN (NITROSTAT) 0.4 MG SL tablet, Place 1 tablet (0.4 mg total) under the tongue every 5 (five) minutes as needed for chest pain. (Patient not taking: Reported on 06/15/2022), Disp: 25 tablet, Rfl: 3   rosuvastatin (CRESTOR) 10 MG tablet, Take 1 tablet (10 mg total) by mouth daily. (Patient not taking: Reported on 06/15/2022), Disp: 90 tablet, Rfl: 1  Allergies  Allergen Reactions   Hydralazine Anaphylaxis, Anxiety, Dermatitis, Rash and Other (See Comments)    Flushing, Anxiety, Tremors    Ciprofloxacin Nausea Only   Propofol Other (See Comments)    Agitated, combative   Ritalin [Methylphenidate Hcl] Other (See Comments)    "climbed the walls"     Objective:   BP 124/60   Pulse 66   Temp 98 F (36.7 C)   Resp 20   Ht 4' 11.1" (1.501 m)   Wt 181 lb (82.1 kg)   BMI 36.43 kg/m    Physical Exam Vitals reviewed.  Constitutional:      General: She is not in acute distress.    Appearance: Normal appearance. She is not ill-appearing, toxic-appearing or diaphoretic.  HENT:     Head: Normocephalic and atraumatic.  Eyes:     General: No scleral icterus.       Right eye: No discharge.        Left eye: No discharge.     Conjunctiva/sclera: Conjunctivae normal.  Cardiovascular:     Rate and Rhythm: Normal rate.  Pulmonary:     Effort: Pulmonary effort is normal. No respiratory distress.  Abdominal:     General: Bowel sounds are normal. There is no distension.     Palpations: Abdomen is soft. There is no mass.     Tenderness: There is no abdominal tenderness. There is no guarding or rebound.     Hernia: No hernia is present.  Musculoskeletal:        General: Normal range of motion.     Cervical back: Normal range of motion.     Comments: Pain in back on right side - patient reports it is deeper than I can palpate.  Skin:    General: Skin is warm and dry.     Capillary Refill: Capillary refill takes less than 2 seconds.  Neurological:     General: No focal deficit present.     Mental Status: She is alert and oriented to person, place, and time. Mental status is at baseline.  Psychiatric:        Mood and Affect: Mood normal.        Behavior: Behavior normal.        Thought Content: Thought content normal.        Judgment: Judgment normal.    EKG: unchanged from previous tracings, normal sinus rhythm, occasional PVC noted.

## 2022-06-15 NOTE — Addendum Note (Signed)
Addended by: Gwenlyn Fudge on: 06/15/2022 05:13 PM   Modules accepted: Orders

## 2022-06-15 NOTE — Telephone Encounter (Signed)
Patient would like to be called back with the results of the CT scan done today  Number:  [phone]

## 2022-06-15 NOTE — Telephone Encounter (Signed)
Pt was given 2 samples of rivaroxaban (XARELTO) 2.5 MG TABS tablet after her appointment on 5.20.24.

## 2022-06-16 ENCOUNTER — Telehealth: Payer: Self-pay | Admitting: Internal Medicine

## 2022-06-16 ENCOUNTER — Other Ambulatory Visit: Payer: Self-pay

## 2022-06-16 ENCOUNTER — Emergency Department (HOSPITAL_BASED_OUTPATIENT_CLINIC_OR_DEPARTMENT_OTHER)
Admission: EM | Admit: 2022-06-16 | Discharge: 2022-06-16 | Disposition: A | Discharge status: Home / Self Care | Payer: Medicare Other | Attending: Emergency Medicine | Admitting: Emergency Medicine

## 2022-06-16 ENCOUNTER — Emergency Department (HOSPITAL_BASED_OUTPATIENT_CLINIC_OR_DEPARTMENT_OTHER): Payer: Medicare Other

## 2022-06-16 ENCOUNTER — Encounter (HOSPITAL_BASED_OUTPATIENT_CLINIC_OR_DEPARTMENT_OTHER): Payer: Self-pay

## 2022-06-16 DIAGNOSIS — Z7982 Long term (current) use of aspirin: Secondary | ICD-10-CM | POA: Diagnosis not present

## 2022-06-16 DIAGNOSIS — Z7984 Long term (current) use of oral hypoglycemic drugs: Secondary | ICD-10-CM | POA: Diagnosis not present

## 2022-06-16 DIAGNOSIS — K760 Fatty (change of) liver, not elsewhere classified: Secondary | ICD-10-CM | POA: Diagnosis not present

## 2022-06-16 DIAGNOSIS — M546 Pain in thoracic spine: Secondary | ICD-10-CM | POA: Diagnosis not present

## 2022-06-16 DIAGNOSIS — R109 Unspecified abdominal pain: Secondary | ICD-10-CM | POA: Diagnosis present

## 2022-06-16 DIAGNOSIS — K5730 Diverticulosis of large intestine without perforation or abscess without bleeding: Secondary | ICD-10-CM | POA: Diagnosis not present

## 2022-06-16 DIAGNOSIS — R079 Chest pain, unspecified: Secondary | ICD-10-CM | POA: Diagnosis not present

## 2022-06-16 LAB — COMPREHENSIVE METABOLIC PANEL
ALT: 39 U/L (ref 0–44)
AST: 25 U/L (ref 15–41)
Albumin: 4.8 g/dL (ref 3.5–5.0)
Alkaline Phosphatase: 38 U/L (ref 38–126)
Anion gap: 12 (ref 5–15)
BUN: 24 mg/dL — ABNORMAL HIGH (ref 8–23)
CO2: 25 mmol/L (ref 22–32)
Calcium: 9.9 mg/dL (ref 8.9–10.3)
Chloride: 102 mmol/L (ref 98–111)
Creatinine, Ser: 1 mg/dL (ref 0.44–1.00)
GFR, Estimated: >60 mL/min (ref >60)
Glucose, Bld: 99 mg/dL (ref 70–99)
Potassium: 3.9 mmol/L (ref 3.5–5.1)
Sodium: 139 mmol/L (ref 135–145)
Total Bilirubin: 1.2 mg/dL (ref 0.3–1.2)
Total Protein: 7.3 g/dL (ref 6.5–8.1)

## 2022-06-16 LAB — CBC
HCT: 43.7 % (ref 36.0–46.0)
Hemoglobin: 15 g/dL (ref 12.0–15.0)
MCH: 32.5 pg (ref 26.0–34.0)
MCHC: 34.3 g/dL (ref 30.0–36.0)
MCV: 94.8 fL (ref 80.0–100.0)
Platelets: 258 10*3/uL (ref 150–400)
RBC: 4.61 MIL/uL (ref 3.87–5.11)
RDW: 13.7 % (ref 11.5–15.5)
WBC: 5.5 10*3/uL (ref 4.0–10.5)
nRBC: 0 % (ref 0.0–0.2)

## 2022-06-16 LAB — LIPASE, BLOOD: Lipase: 25 U/L (ref 11–51)

## 2022-06-16 LAB — TROPONIN I (HIGH SENSITIVITY): Troponin I (High Sensitivity): 3 ng/L (ref <18)

## 2022-06-16 MED ORDER — ONDANSETRON HCL 4 MG/2ML IJ SOLN
4.0000 mg | Freq: Once | INTRAMUSCULAR | Status: AC
  Administered 2022-06-16: 4 mg via INTRAVENOUS
  Filled 2022-06-16: qty 2

## 2022-06-16 MED ORDER — LIDOCAINE 5 % EX PTCH
1.0000 | MEDICATED_PATCH | CUTANEOUS | Status: DC
  Administered 2022-06-16: 1 via TRANSDERMAL
  Filled 2022-06-16: qty 1

## 2022-06-16 MED ORDER — KETOROLAC TROMETHAMINE 15 MG/ML IJ SOLN
15.0000 mg | Freq: Once | INTRAMUSCULAR | Status: DC
  Filled 2022-06-16: qty 1

## 2022-06-16 MED ORDER — FENTANYL CITRATE PF 50 MCG/ML IJ SOSY
50.0000 ug | PREFILLED_SYRINGE | Freq: Once | INTRAMUSCULAR | Status: AC
  Administered 2022-06-16: 50 ug via INTRAVENOUS
  Filled 2022-06-16: qty 1

## 2022-06-16 MED ORDER — OXYCODONE HCL 5 MG PO TABS: 5.0000 mg | ORAL_TABLET | Freq: Four times a day (QID) | ORAL | 0 refills | Status: DC | PRN

## 2022-06-16 MED ORDER — LIDOCAINE 5 % EX PTCH: 1.0000 | MEDICATED_PATCH | CUTANEOUS | 0 refills | Status: DC

## 2022-06-16 NOTE — ED Notes (Signed)
DC papers reviewed. No questions or concerns. No signs of distress. Pt assisted to wheelchair and out to lobby. Appropriate measures for safety taken. 

## 2022-06-16 NOTE — ED Provider Notes (Signed)
[facility] EMERGENCY DEPARTMENT AT [facility] Provider Note   CSN: [id number] Arrival date & time: 06/16/22  1921     History  Chief Complaint  Patient presents with   Back Pain    [name] is a 72 y.o. female.  Patient here with right flank pain for the last few days.  Had a CT scan yesterday to rule out blood clot by her primary care doctor.  Put on a muscle relaxant still having discomfort.  Denies any pain with urination.  Her chart does say history of kidney stones, kidney infection.  Denies any weakness numbness chills.  Nothing makes it worse or better.  The history is provided by the patient.       Home Medications Prior to Admission medications   Medication Sig Start Date End Date Taking? Authorizing Provider  lidocaine (LIDODERM) 5 % Place 1 patch onto the skin daily. Remove & Discard patch within 12 hours or as directed by MD 06/16/22  Yes Oluwaseun Bruyere, DO  oxyCODONE (ROXICODONE) 5 MG immediate release tablet Take 1 tablet (5 mg total) by mouth every 6 (six) hours as needed for up to 6 doses for breakthrough pain. 06/16/22  Yes Garry Bochicchio, DO  acetaminophen (TYLENOL) 500 MG tablet Take 1,000 mg by mouth 3 (three) times daily.    [provider]  aspirin EC 81 MG tablet Take 1 tablet (81 mg total) by mouth daily. Swallow whole. 07/21/21   Pincus Sanes, MD  Blood Glucose Monitoring Suppl (FREESTYLE LITE) w/Device KIT USE TWO TIMES A DAY TO TEST GLUCOSE CONTROL 06/30/21   Panosh, Neta Mends, MD  dapagliflozin propanediol (FARXIGA) 10 MG TABS tablet Take 1 tablet (10 mg total) by mouth daily before breakfast. 01/06/22   Etta Grandchild, MD  FREESTYLE LITE test strip USE 1 STRIP TO TEST TWICE A DAY 07/24/21   Panosh, Neta Mends, MD  indapamide (LOZOL) 1.25 MG tablet Take 1 tablet (1.25 mg total) by mouth daily. Follow-up appt due in June must see MD for future refills 04/08/22   Etta Grandchild, MD  metFORMIN (GLUCOPHAGE-XR) 750 MG 24 hr tablet Take 1  tablet (750 mg total) by mouth daily with breakfast. Patient not taking: Reported on 06/15/2022 01/06/22   Etta Grandchild, MD  methocarbamol (ROBAXIN) 500 MG tablet Take 1 tablet (500 mg total) by mouth every 8 (eight) hours as needed for muscle spasms. 06/15/22   Gwenlyn Fudge, FNP  nitroGLYCERIN (NITROSTAT) 0.4 MG SL tablet Place 1 tablet (0.4 mg total) under the tongue every 5 (five) minutes as needed for chest pain. Patient not taking: Reported on 06/15/2022 07/22/21   Tereso Newcomer T, PA-C  olmesartan (BENICAR) 20 MG tablet Take 1 tablet (20 mg total) by mouth daily. 01/06/22   Etta Grandchild, MD  pantoprazole (PROTONIX) 40 MG tablet TAKE 1 TABLET BY MOUTH TWICE A DAY 04/10/22   Worthy Rancher B, FNP  rivaroxaban (XARELTO) 2.5 MG TABS tablet Take 1 tablet (2.5 mg total) by mouth 2 (two) times daily. 01/10/22   Etta Grandchild, MD  rOPINIRole (REQUIP) 0.5 MG tablet TAKE 1 OR  2 TABLETS BY MOUTH DAILY AT NOON AND AT 5PM , THEN 3 TABLETS EVERY NIGHT AT BEDTIME 06/10/22   Etta Grandchild, MD  rosuvastatin (CRESTOR) 10 MG tablet Take 1 tablet (10 mg total) by mouth daily. Patient not taking: Reported on 06/15/2022 01/06/22   Etta Grandchild, MD  SYNTHROID 112 MCG tablet Take 1 tablet (  112 mcg total) by mouth daily with breakfast. Annual appt due in June must see provider for future refills 05/06/22   Etta Grandchild, MD      Allergies    Hydralazine, Ciprofloxacin, Propofol, and Ritalin [methylphenidate hcl]    Review of Systems   Review of Systems  Physical Exam Updated Vital Signs BP 134/85 (BP Location: Right Arm)   Pulse (!) 101   Temp 98.4 F (36.9 C) (Oral)   Resp 17   Ht 4' 11.1" (1.501 m)   Wt 82.1 kg   SpO2 96%   BMI 36.43 kg/m  Physical Exam Vitals and nursing note reviewed.  Constitutional:      General: She is not in acute distress.    Appearance: She is well-developed. She is not ill-appearing.  HENT:     Head: Normocephalic and atraumatic.     Nose: Nose normal.      Mouth/Throat:     Mouth: Mucous membranes are moist.  Eyes:     Extraocular Movements: Extraocular movements intact.     Conjunctiva/sclera: Conjunctivae normal.     Pupils: Pupils are equal, round, and reactive to light.  Cardiovascular:     Rate and Rhythm: Normal rate and regular rhythm.     Pulses: Normal pulses.     Heart sounds: Normal heart sounds. No murmur heard. Pulmonary:     Effort: Pulmonary effort is normal. No respiratory distress.     Breath sounds: Normal breath sounds.  Abdominal:     Palpations: Abdomen is soft.     Tenderness: There is no abdominal tenderness. There is right CVA tenderness.  Musculoskeletal:        General: No swelling.     Cervical back: Normal range of motion and neck supple.  Skin:    General: Skin is warm and dry.     Capillary Refill: Capillary refill takes less than 2 seconds.  Neurological:     General: No focal deficit present.     Mental Status: She is alert.  Psychiatric:        Mood and Affect: Mood normal.     ED Results / Procedures / Treatments   Labs (all labs ordered are listed, but only abnormal results are displayed) Labs Reviewed  COMPREHENSIVE METABOLIC PANEL - Abnormal; Notable for the following components:      Result Value   BUN 24 (*)    All other components within normal limits  CBC  LIPASE, BLOOD  TROPONIN I (HIGH SENSITIVITY)    EKG EKG Interpretation  Date/Time:  Tuesday Jun 16 2022 19:45:52 EDT Ventricular Rate:  84 PR Interval:  166 QRS Duration: 84 QT Interval:  356 QTC Calculation: 420 R Axis:   35 Text Interpretation: Normal sinus rhythm Confirmed by Virgina Norfolk ([id number]) on 06/16/2022 7:49:07 PM  Radiology CT Renal Stone Study  Result Date: 06/16/2022 CLINICAL DATA:  Right abdominal and flank pain. Shortness of breath. EXAM: CT ABDOMEN AND PELVIS WITHOUT CONTRAST TECHNIQUE: Multidetector CT imaging of the abdomen and pelvis was performed following the standard protocol without IV contrast.  RADIATION DOSE REDUCTION: This exam was performed according to the departmental dose-optimization program which includes automated exposure control, adjustment of the mA and/or kV according to patient size and/or use of iterative reconstruction technique. COMPARISON:  CT angiogram chest 06/15/2022. CT abdomen and pelvis 08/21/2019 FINDINGS: Lower chest: No acute abnormality. Hepatobiliary: There is fatty infiltration of the liver. Gallbladder and bile ducts are within normal limits. Pancreas: Unremarkable. No pancreatic  ductal dilatation or surrounding inflammatory changes. Spleen: Normal in size without focal abnormality. Adrenals/Urinary Tract: Adrenal glands are unremarkable. Kidneys are normal, without renal calculi, focal lesion, or hydronephrosis. Bladder is unremarkable. Stomach/Bowel: Stomach is within normal limits. Appendix is not seen. No evidence of bowel wall thickening, distention, or inflammatory changes. There is sigmoid colon diverticulosis. Vascular/Lymphatic: No significant vascular findings are present. No enlarged abdominal or pelvic lymph nodes. Reproductive: Status post hysterectomy. No adnexal masses. Other: No abdominal wall hernia or abnormality. No abdominopelvic ascites. Musculoskeletal: No acute or significant osseous findings. IMPRESSION: 1. No acute localizing process in the abdomen or pelvis. 2. Fatty infiltration of the liver. 3. Sigmoid colon diverticulosis. Electronically Signed   By: Darliss Cheney M.D.   On: 06/16/2022 20:21   DG Chest Portable 1 View  Result Date: 06/16/2022 CLINICAL DATA:  Chest pain EXAM: PORTABLE CHEST 1 VIEW COMPARISON:  08/08/2021 FINDINGS: The heart size and mediastinal contours are within normal limits. Both lungs are clear. The visualized skeletal structures are unremarkable. IMPRESSION: No active disease. Electronically Signed   By: Jasmine Pang M.D.   On: 06/16/2022 20:11   CT Angio Chest Pulmonary Embolism (PE) W or WO Contrast  Result Date:  06/15/2022 CLINICAL DATA:  Chest pain along the right side began last night. Heaviness in the chest. EXAM: CT ANGIOGRAPHY CHEST WITH CONTRAST TECHNIQUE: Multidetector CT imaging of the chest was performed using the standard protocol during bolus administration of intravenous contrast. Multiplanar CT image reconstructions and MIPs were obtained to evaluate the vascular anatomy. RADIATION DOSE REDUCTION: This exam was performed according to the departmental dose-optimization program which includes automated exposure control, adjustment of the mA and/or kV according to patient size and/or use of iterative reconstruction technique. CONTRAST:  75mL OMNIPAQUE IOHEXOL 350 MG/ML SOLN COMPARISON:  None Available. FINDINGS: Cardiovascular: Satisfactory opacification of the pulmonary arteries to the segmental level. No evidence of pulmonary embolism. Normal heart size. No pericardial effusion. Mediastinum/Nodes: No enlarged mediastinal, hilar, or axillary lymph nodes. Thyroid gland, trachea, and esophagus demonstrate no significant findings. Lungs/Pleura: No pleural effusion or pneumothorax. No focal consolidation. Bibasilar atelectasis. Upper Abdomen: No acute abnormality. Diffuse low-attenuation of the liver as can be seen with hepatic steatosis. Musculoskeletal: No chest wall abnormality. No acute or significant osseous findings. Review of the MIP images confirms the above findings. IMPRESSION: 1. No evidence of pulmonary embolus. 2. No acute cardiopulmonary disease. Electronically Signed   By: Elige Ko M.D.   On: 06/15/2022 12:30    Procedures Procedures    Medications Ordered in ED Medications  lidocaine (LIDODERM) 5 % 1 patch (has no administration in time range)  fentaNYL (SUBLIMAZE) injection 50 mcg (50 mcg Intravenous Given 06/16/22 2022)  ondansetron (ZOFRAN) injection 4 mg (4 mg Intravenous Given 06/16/22 2022)    ED Course/ Medical Decision Making/ A&P                             Medical Decision  Making Amount and/or Complexity of Data Reviewed Labs: ordered. Radiology: ordered.  Risk Prescription drug management.   [place] is here with right flank pain.  History of kidney stones, kidney infection, diverticulitis, IBS.  Normal vitals.  No fever.  Per my review of her chart she had a CT scan of the chest to rule out blood clot and pneumonia yesterday.  Still ongoing pain despite muscle relaxant at home.  Differential diagnosis likely kidney stones versus muscular process versus less likely  gallbladder etiology/pancreatitis.  Very atypical story for ACS.  Will get a CT renal study today, give IV fluids, IV Zofran and IV fentanyl check CBC, CMP, lipase, troponin and repeat a chest x-ray.  Per my review and interpretation of labs there is no significant anemia or electrolyte abnormality or kidney injury or leukocytosis.  Troponin is normal.  CT scan per radiology reports unremarkable.  No kidney stone.  No gallstones.  Overall I do suspect that this is musculoskeletal in nature.  Patient feeling better after pain medicine here and lidocaine patch.  Will prescribe Roxicodone for breakthrough pain to go along with her muscle relaxant prescribed by primary care doctor.  Discharged in good condition.  This chart was dictated using voice recognition software.  Despite best efforts to proofread,  errors can occur which can change the documentation meaning.         Final Clinical Impression(s) / ED Diagnoses Final diagnoses:  Acute right-sided thoracic back pain    Rx / DC Orders ED Discharge Orders          Ordered    oxyCODONE (ROXICODONE) 5 MG immediate release tablet  Every 6 hours PRN        06/16/22 2039    lidocaine (LIDODERM) 5 %  Every 24 hours        06/16/22 2039              Virgina Norfolk, DO 06/16/22 2045

## 2022-06-16 NOTE — ED Triage Notes (Signed)
Patient here POV from Home  Endorses Right Sided Back Pain and Heaviness in Chest along with SOB for 2 Days. Seen by MD yesterday. CTA was performed which was unremarkable.   Some nausea. No emesis. A few episodes of loose stools.   NAD Noted during Triage. A&Ox4. Gcs 15. Ambulatory.

## 2022-06-16 NOTE — Telephone Encounter (Signed)
Patient was seen by Deliah Boston yesterday 06/15/2022 for pain on her side and back. She received a CT that did not show an issue. She was prescribed a muscle relaxer and she said it does not seem to help. She is wondering if it may be her gallbladder. She said she is still in pain and feeling tightness like she's being squeezed. The patient would like a call back at [phone].

## 2022-06-16 NOTE — Telephone Encounter (Signed)
Pt's daughter, Asher Muir, is following up on this message. Asher Muir states that the pt is tearfully in pain and feels that it may be her gallbladder. I recommended that she be seen at the ED. Please advise if you have further recommendations.

## 2022-06-16 NOTE — Telephone Encounter (Signed)
Perfect recommendation, pt should go to ER

## 2022-06-16 NOTE — Discharge Instructions (Addendum)
Continue 1000 mg of Tylenol every 6 hours as needed for pain.  Recommend lidocaine patches daily.  If prescription lidocaine patches are too expensive consider buying over-the-counter lidocaine patches.  These are 4% patches instead of 5% patches which are prescription.  Those should work just fine as well.  Continue muscle relaxant as prescribed.  I have written you for narcotic pain medicine for breakthrough pain called Roxicodone.  Both Roxicodone and your muscle relaxant are sedating so please be careful with its use.  Do not do any dangerous activities while taking this medicine.  Follow-up with your primary care doctor.

## 2022-06-24 ENCOUNTER — Telehealth: Payer: Self-pay | Admitting: Internal Medicine

## 2022-06-24 ENCOUNTER — Encounter: Payer: Self-pay | Admitting: Internal Medicine

## 2022-06-24 ENCOUNTER — Ambulatory Visit (INDEPENDENT_AMBULATORY_CARE_PROVIDER_SITE_OTHER): Payer: Medicare Other | Admitting: Internal Medicine

## 2022-06-24 VITALS — BP 110/62 | HR 74 | Temp 98.0°F | Ht 59.0 in | Wt 182.0 lb

## 2022-06-24 DIAGNOSIS — I10 Essential (primary) hypertension: Secondary | ICD-10-CM | POA: Diagnosis not present

## 2022-06-24 DIAGNOSIS — E118 Type 2 diabetes mellitus with unspecified complications: Secondary | ICD-10-CM

## 2022-06-24 DIAGNOSIS — Z7985 Long-term (current) use of injectable non-insulin antidiabetic drugs: Secondary | ICD-10-CM

## 2022-06-24 DIAGNOSIS — I739 Peripheral vascular disease, unspecified: Secondary | ICD-10-CM

## 2022-06-24 DIAGNOSIS — E785 Hyperlipidemia, unspecified: Secondary | ICD-10-CM | POA: Diagnosis not present

## 2022-06-24 LAB — MICROALBUMIN / CREATININE URINE RATIO
Creatinine,U: 75.4 mg/dL
Microalb Creat Ratio: 0.9 mg/g (ref 0.0–30.0)
Microalb, Ur: <0.7 mg/dL (ref 0.0–1.9)

## 2022-06-24 LAB — URINALYSIS, ROUTINE W REFLEX MICROSCOPIC
Bilirubin Urine: NEGATIVE
Hgb urine dipstick: NEGATIVE
Ketones, ur: NEGATIVE
Leukocytes,Ua: NEGATIVE
Nitrite: NEGATIVE
RBC / HPF: NONE SEEN (ref 0–?)
Specific Gravity, Urine: 1.025 (ref 1.000–1.030)
Total Protein, Urine: NEGATIVE
Urine Glucose: >=1000 — AB
Urobilinogen, UA: 0.2 (ref 0.0–1.0)
pH: 5.5 (ref 5.0–8.0)

## 2022-06-24 LAB — HEMOGLOBIN A1C: Hgb A1c MFr Bld: 7.2 % — ABNORMAL HIGH (ref 4.6–6.5)

## 2022-06-24 LAB — TSH: TSH: 2.16 u[IU]/mL (ref 0.35–5.50)

## 2022-06-24 MED ORDER — RIVAROXABAN 2.5 MG PO TABS: 2.5000 mg | ORAL_TABLET | Freq: Two times a day (BID) | ORAL | 0 refills | Status: DC

## 2022-06-24 MED ORDER — ASPIRIN 81 MG PO TBEC: 81.0000 mg | DELAYED_RELEASE_TABLET | Freq: Every day | ORAL | 1 refills | Status: DC

## 2022-06-24 MED ORDER — TIRZEPATIDE 2.5 MG/0.5ML ~~LOC~~ SOAJ
2.5000 mg | SUBCUTANEOUS | 0 refills | Status: DC
Start: 2022-06-24 — End: 2022-07-15

## 2022-06-24 MED ORDER — ROSUVASTATIN CALCIUM 10 MG PO TABS: 10.0000 mg | ORAL_TABLET | Freq: Every day | ORAL | 1 refills | Status: DC

## 2022-06-24 NOTE — Progress Notes (Signed)
Subjective:  Patient ID: [name], female    DOB: 1950/10/12  Age: 72 y.o. MRN: [id number]  CC: Hypertension and Diabetes   HPI [place] presents for f/up --  She is active and denies chest pain, shortness of breath, diaphoresis, or edema.  She complains of chronic fatigue.  She was recently seen in the ED for musculoskeletal pain but she tells me that has resolved.  Outpatient Medications Prior to Visit  Medication Sig Dispense Refill   acetaminophen (TYLENOL) 500 MG tablet Take 1,000 mg by mouth 3 (three) times daily.     Blood Glucose Monitoring Suppl (FREESTYLE LITE) w/Device KIT USE TWO TIMES A DAY TO TEST GLUCOSE CONTROL 1 kit 0   dapagliflozin propanediol (FARXIGA) 10 MG TABS tablet Take 1 tablet (10 mg total) by mouth daily before breakfast. 90 tablet 1   FREESTYLE LITE test strip USE 1 STRIP TO TEST TWICE A DAY 50 strip 0   metFORMIN (GLUCOPHAGE-XR) 750 MG 24 hr tablet Take 1 tablet (750 mg total) by mouth daily with breakfast. 90 tablet 1   nitroGLYCERIN (NITROSTAT) 0.4 MG SL tablet Place 1 tablet (0.4 mg total) under the tongue every 5 (five) minutes as needed for chest pain. 25 tablet 3   olmesartan (BENICAR) 20 MG tablet Take 1 tablet (20 mg total) by mouth daily. 90 tablet 1   oxyCODONE (ROXICODONE) 5 MG immediate release tablet Take 1 tablet (5 mg total) by mouth every 6 (six) hours as needed for up to 6 doses for breakthrough pain. 6 tablet 0   rOPINIRole (REQUIP) 0.5 MG tablet TAKE 1 OR  2 TABLETS BY MOUTH DAILY AT NOON AND AT 5PM , THEN 3 TABLETS EVERY NIGHT AT BEDTIME 360 tablet 0   SYNTHROID 112 MCG tablet Take 1 tablet (112 mcg total) by mouth daily with breakfast. Annual appt due in June must see provider for future refills 90 tablet 0   aspirin EC 81 MG tablet Take 1 tablet (81 mg total) by mouth daily. Swallow whole. 30 tablet 12   indapamide (LOZOL) 1.25 MG tablet Take 1 tablet (1.25 mg total) by mouth daily. Follow-up appt due in June must see MD  for future refills 90 tablet 0   lidocaine (LIDODERM) 5 % Place 1 patch onto the skin daily. Remove & Discard patch within 12 hours or as directed by MD 30 patch 0   methocarbamol (ROBAXIN) 500 MG tablet Take 1 tablet (500 mg total) by mouth every 8 (eight) hours as needed for muscle spasms. 60 tablet 0   pantoprazole (PROTONIX) 40 MG tablet TAKE 1 TABLET BY MOUTH TWICE A DAY 180 tablet 0   rivaroxaban (XARELTO) 2.5 MG TABS tablet Take 1 tablet (2.5 mg total) by mouth 2 (two) times daily. 140 tablet 0   rosuvastatin (CRESTOR) 10 MG tablet Take 1 tablet (10 mg total) by mouth daily. 90 tablet 1   No facility-administered medications prior to visit.    ROS Review of Systems  Constitutional:  Positive for fatigue. Negative for appetite change, chills, diaphoresis and unexpected weight change.  HENT: Negative.    Eyes:  Negative for visual disturbance.  Respiratory:  Negative for cough, chest tightness, shortness of breath and wheezing.   Cardiovascular:  Negative for chest pain, palpitations and leg swelling.  Gastrointestinal:  Negative for abdominal pain, constipation, diarrhea, nausea and vomiting.  Genitourinary: Negative.  Negative for difficulty urinating.  Musculoskeletal: Negative.  Negative for myalgias.  Skin: Negative.   Neurological:  Negative  for dizziness, weakness and light-headedness.  Hematological:  Negative for adenopathy. Does not bruise/bleed easily.  Psychiatric/Behavioral: Negative.      Objective:  BP 110/62 (BP Location: Right Arm, Patient Position: Sitting, Cuff Size: Large)   Pulse 74   Temp 98 F (36.7 C) (Oral)   Ht 4\' 11"  (1.499 m)   Wt 182 lb (82.6 kg)   SpO2 93%   BMI 36.76 kg/m   BP Readings from Last 3 Encounters:  06/24/22 110/62  06/16/22 105/64  06/15/22 124/60    Wt Readings from Last 3 Encounters:  06/24/22 182 lb (82.6 kg)  06/16/22 181 lb (82.1 kg)  06/15/22 181 lb (82.1 kg)    Physical Exam Vitals reviewed.  Constitutional:       Appearance: Normal appearance.  HENT:     Nose: Nose normal.     Mouth/Throat:     Mouth: Mucous membranes are moist.  Eyes:     General: No scleral icterus.    Conjunctiva/sclera: Conjunctivae normal.  Cardiovascular:     Rate and Rhythm: Normal rate and regular rhythm.     Heart sounds: No murmur heard. Pulmonary:     Effort: Pulmonary effort is normal.     Breath sounds: No stridor. No wheezing, rhonchi or rales.  Abdominal:     General: Abdomen is flat.     Palpations: There is no mass.     Tenderness: There is no abdominal tenderness. There is no guarding.     Hernia: No hernia is present.  Musculoskeletal:        General: Normal range of motion.     Cervical back: Neck supple.     Right lower leg: No edema.     Left lower leg: No edema.     Comments: She has bilateral nonpitting edema.  Lymphadenopathy:     Cervical: No cervical adenopathy.  Skin:    General: Skin is warm and dry.  Neurological:     General: No focal deficit present.     Mental Status: She is alert. Mental status is at baseline.  Psychiatric:        Mood and Affect: Mood normal.        Behavior: Behavior normal.     Lab Results  Component Value Date   WBC 5.5 06/16/2022   HGB 15.0 06/16/2022   HCT 43.7 06/16/2022   PLT 258 06/16/2022   GLUCOSE 99 06/16/2022   CHOL 144 01/06/2022   TRIG 193.0 (H) 01/06/2022   HDL 50.00 01/06/2022   LDLDIRECT 142.0 01/28/2016   LDLCALC 55 01/06/2022   ALT 39 06/16/2022   AST 25 06/16/2022   NA 139 06/16/2022   K 3.9 06/16/2022   CL 102 06/16/2022   CREATININE 1.00 06/16/2022   BUN 24 (H) 06/16/2022   CO2 25 06/16/2022   TSH 2.16 06/24/2022   INR 1.12 03/17/2017   HGBA1C 7.2 (H) 06/24/2022   MICROALBUR <0.7 06/24/2022    CT Renal Stone Study  Result Date: 06/16/2022 CLINICAL DATA:  Right abdominal and flank pain. Shortness of breath. EXAM: CT ABDOMEN AND PELVIS WITHOUT CONTRAST TECHNIQUE: Multidetector CT imaging of the abdomen and pelvis was  performed following the standard protocol without IV contrast. RADIATION DOSE REDUCTION: This exam was performed according to the departmental dose-optimization program which includes automated exposure control, adjustment of the mA and/or kV according to patient size and/or use of iterative reconstruction technique. COMPARISON:  CT angiogram chest 06/15/2022. CT abdomen and pelvis 08/21/2019 FINDINGS: Lower chest:  No acute abnormality. Hepatobiliary: There is fatty infiltration of the liver. Gallbladder and bile ducts are within normal limits. Pancreas: Unremarkable. No pancreatic ductal dilatation or surrounding inflammatory changes. Spleen: Normal in size without focal abnormality. Adrenals/Urinary Tract: Adrenal glands are unremarkable. Kidneys are normal, without renal calculi, focal lesion, or hydronephrosis. Bladder is unremarkable. Stomach/Bowel: Stomach is within normal limits. Appendix is not seen. No evidence of bowel wall thickening, distention, or inflammatory changes. There is sigmoid colon diverticulosis. Vascular/Lymphatic: No significant vascular findings are present. No enlarged abdominal or pelvic lymph nodes. Reproductive: Status post hysterectomy. No adnexal masses. Other: No abdominal wall hernia or abnormality. No abdominopelvic ascites. Musculoskeletal: No acute or significant osseous findings. IMPRESSION: 1. No acute localizing process in the abdomen or pelvis. 2. Fatty infiltration of the liver. 3. Sigmoid colon diverticulosis. Electronically Signed   By: Darliss Cheney M.D.   On: 06/16/2022 20:21   DG Chest Portable 1 View  Result Date: 06/16/2022 CLINICAL DATA:  Chest pain EXAM: PORTABLE CHEST 1 VIEW COMPARISON:  08/08/2021 FINDINGS: The heart size and mediastinal contours are within normal limits. Both lungs are clear. The visualized skeletal structures are unremarkable. IMPRESSION: No active disease. Electronically Signed   By: Jasmine Pang M.D.   On: 06/16/2022 20:11     Assessment & Plan:   PAD (peripheral artery disease) (HCC) -     Aspirin; Take 1 tablet (81 mg total) by mouth daily. Swallow whole.  Dispense: 90 tablet; Refill: 1 -     Rivaroxaban; Take 1 tablet (2.5 mg total) by mouth 2 (two) times daily.  Dispense: 126 tablet; Refill: 0 -     Rosuvastatin Calcium; Take 1 tablet (10 mg total) by mouth daily.  Dispense: 90 tablet; Refill: 1  Dyslipidemia, goal LDL below 70- LDL goal achieved. Doing well on the statin  -     Rosuvastatin Calcium; Take 1 tablet (10 mg total) by mouth daily.  Dispense: 90 tablet; Refill: 1 -     TSH; Future  Type II diabetes mellitus with manifestations (HCC) -     Hemoglobin A1c; Future -     Urinalysis, Routine w reflex microscopic; Future -     Microalbumin / creatinine urine ratio; Future -     Tirzepatide; Inject 2.5 mg into the skin once a week.  Dispense: 2 mL; Refill: 0  Primary hypertension- Her blood pressure is overcontrolled.  Will discontinue the diuretic. -     Urinalysis, Routine w reflex microscopic; Future     Follow-up: Return in about 4 months (around 10/25/2022).  Sanda Linger, MD

## 2022-06-24 NOTE — Telephone Encounter (Signed)
Patient called and said that she can not afford the mounjaro.  Patient wants to know if you have samples of it and also if there is a patient assistance program.  Please call patient and let her know.  Phone:  [phone]

## 2022-06-24 NOTE — Patient Instructions (Signed)

## 2022-06-26 MED ORDER — PANTOPRAZOLE SODIUM 40 MG PO TBEC: 40.0000 mg | DELAYED_RELEASE_TABLET | Freq: Two times a day (BID) | ORAL | 0 refills | Status: DC

## 2022-06-26 NOTE — Telephone Encounter (Signed)
Attempted to reach pt. Regarding to prescription refill request on Pantoprazole. Pt has not been seen with Dr. Fabian Sharp over a year. Left a voicemail to call us back.   Limited supply sent.

## 2022-06-29 ENCOUNTER — Telehealth: Payer: Self-pay | Admitting: Internal Medicine

## 2022-06-29 NOTE — Telephone Encounter (Signed)
Patient would like a call back about her lab results from 06/23/2021. Best callback is [phone].

## 2022-07-04 ENCOUNTER — Other Ambulatory Visit: Payer: Self-pay | Admitting: Internal Medicine

## 2022-07-04 DIAGNOSIS — I10 Essential (primary) hypertension: Secondary | ICD-10-CM

## 2022-07-07 ENCOUNTER — Telehealth: Payer: Self-pay | Admitting: Internal Medicine

## 2022-07-07 NOTE — Telephone Encounter (Signed)
Patient called and said [facility] told her that she needs a referral from Dr. Yetta Barre for her to get a membership at the [facility] in downtown [place]. Best callback is [phone].

## 2022-07-10 ENCOUNTER — Encounter: Payer: Self-pay | Admitting: Pharmacist

## 2022-07-10 NOTE — Progress Notes (Signed)
[facility] Customer service manager [facility])  [facility] Quality Pharmacy Team   07/10/2022  [name] 13-Sep-1950 [id number]  Reason for referral: Medication assistance  Referral source: [facility] Social Worker Current insurance: Traditional Medicare  Outreach:  Successful telephone call with [name].  HIPAA identifiers verified.   Medication Review Findings:  Xarelto  Ozempic  Medication Assistance Findings:  Medication assistance needs identified: Xarelto and Ozempic  Extra Help:  Not eligible for Extra Help Low Income Subsidy based on reported income and assets  Additional medication assistance options reviewed with patient as warranted:  No other options identified  Plan: I will route patient assistance letter to [facility] pharmacy technician who will coordinate patient assistance program application process for medications listed above.  [facility] pharmacy technician will assist with obtaining all required documents from both patient and provider(s) and submit application(s) once completed.  Thank you for allowing [facility] pharmacy to be a part of this patient's care.  Dellie Burns, PharmD [facility]  [facility] Clinical Pharmacist Office: [phone]

## 2022-07-15 ENCOUNTER — Telehealth: Payer: Self-pay | Admitting: Internal Medicine

## 2022-07-15 ENCOUNTER — Other Ambulatory Visit: Payer: Self-pay | Admitting: Internal Medicine

## 2022-07-15 DIAGNOSIS — E118 Type 2 diabetes mellitus with unspecified complications: Secondary | ICD-10-CM

## 2022-07-15 MED ORDER — INSULIN PEN NEEDLE 32G X 6 MM MISC
1.0000 | 0 refills | Status: DC
Start: 2022-07-15 — End: 2023-12-22

## 2022-07-15 MED ORDER — OZEMPIC (0.25 OR 0.5 MG/DOSE) 2 MG/3ML ~~LOC~~ SOPN
0.2500 mg | PEN_INJECTOR | SUBCUTANEOUS | 0 refills | Status: DC
Start: 2022-07-15 — End: 2022-11-17

## 2022-07-15 NOTE — Telephone Encounter (Signed)
Patient said she and Dr. Yetta Barre have spoken about possibly starting Ozempic. She wanted to know the status of that since she can't do Mounjaro due to price. Best callback is [phone].

## 2022-07-16 ENCOUNTER — Telehealth: Payer: Self-pay | Admitting: Pharmacy Technician

## 2022-07-16 ENCOUNTER — Telehealth: Payer: Self-pay | Admitting: Internal Medicine

## 2022-07-16 DIAGNOSIS — Z5986 Financial insecurity: Secondary | ICD-10-CM

## 2022-07-16 NOTE — Telephone Encounter (Signed)
Patient assistance forms for Ozempic received & placed in provider's box up front.

## 2022-07-16 NOTE — Progress Notes (Signed)
[facility] Customer service manager [facility])                                            [facility] Quality Pharmacy Team    07/16/2022  [name] 1950-08-12 [id number]                                      Medication Assistance Referral  Referral From:  [facility]  [name]  Medication/Company: [name] / [employer] Patient application portion:  Mailed Provider application portion: Faxed  to Dr. Sanda Lin Provider address/fax verified via: Office website  Medication/Company: [name] Patient application portion:  Mailed Provider application portion: Faxed  to Dr. Sanda Lin Provider address/fax verified via: Office website   Pattricia Boss, CPhT [facility] Office: [phone] Fax: [phone] Email: [email or web address]

## 2022-07-16 NOTE — Telephone Encounter (Signed)
Patient assistance forms received for xarelto - in provider's box up front.

## 2022-07-16 NOTE — Telephone Encounter (Signed)
Forms were received and given to PCP to review and sign.

## 2022-07-17 NOTE — Telephone Encounter (Signed)
Forms have been signed and faxed back for Ozempic and Zarelto.

## 2022-07-20 ENCOUNTER — Ambulatory Visit (INDEPENDENT_AMBULATORY_CARE_PROVIDER_SITE_OTHER): Payer: Medicare Other | Admitting: Family Medicine

## 2022-07-20 ENCOUNTER — Encounter: Payer: Self-pay | Admitting: Family Medicine

## 2022-07-20 VITALS — BP 148/80 | HR 68 | Temp 98.4°F | Resp 20 | Ht 59.0 in | Wt 182.0 lb

## 2022-07-20 DIAGNOSIS — S40862A Insect bite (nonvenomous) of left upper arm, initial encounter: Secondary | ICD-10-CM

## 2022-07-20 DIAGNOSIS — E118 Type 2 diabetes mellitus with unspecified complications: Secondary | ICD-10-CM | POA: Diagnosis not present

## 2022-07-20 DIAGNOSIS — L03114 Cellulitis of left upper limb: Secondary | ICD-10-CM | POA: Diagnosis not present

## 2022-07-20 DIAGNOSIS — Z7984 Long term (current) use of oral hypoglycemic drugs: Secondary | ICD-10-CM

## 2022-07-20 DIAGNOSIS — N1831 Chronic kidney disease, stage 3a: Secondary | ICD-10-CM | POA: Diagnosis not present

## 2022-07-20 DIAGNOSIS — W57XXXA Bitten or stung by nonvenomous insect and other nonvenomous arthropods, initial encounter: Secondary | ICD-10-CM | POA: Diagnosis not present

## 2022-07-20 DIAGNOSIS — I10 Essential (primary) hypertension: Secondary | ICD-10-CM | POA: Diagnosis not present

## 2022-07-20 MED ORDER — DAPAGLIFLOZIN PROPANEDIOL 10 MG PO TABS: 10.0000 mg | ORAL_TABLET | Freq: Every day | ORAL | 1 refills | Status: DC

## 2022-07-20 MED ORDER — LEVOCETIRIZINE DIHYDROCHLORIDE 5 MG PO TABS
5.0000 mg | ORAL_TABLET | Freq: Every evening | ORAL | 0 refills | Status: DC
Start: 2022-07-20 — End: 2022-11-17

## 2022-07-20 MED ORDER — CEPHALEXIN 500 MG PO CAPS
500.0000 mg | ORAL_CAPSULE | Freq: Two times a day (BID) | ORAL | 0 refills | Status: AC
Start: 2022-07-20 — End: 2022-07-27

## 2022-07-20 NOTE — Patient Instructions (Signed)
Monitor blood pressure at home and notify us if your readings stay >140/90.

## 2022-07-20 NOTE — Progress Notes (Signed)
Assessment & Plan:  1. Cellulitis of left arm Education provided on cellulitis. - cephALEXin (KEFLEX) 500 MG capsule; Take 1 capsule (500 mg total) by mouth 2 (two) times daily for 7 days.  Dispense: 14 capsule; Refill: 0  2. Insect bite of left upper arm, initial encounter Education provided on insect bites. - levocetirizine (XYZAL) 5 MG tablet; Take 1 tablet (5 mg total) by mouth every evening.  Dispense: 30 tablet; Refill: 0  3. Primary hypertension Encourage patient to monitor her blood pressure at home and notify us if readings are consistently >140/90.  4-5. 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 - dapagliflozin propanediol (FARXIGA) 10 MG TABS tablet; Take 1 tablet (10 mg total) by mouth daily before breakfast.  Dispense: 90 tablet; Refill: 1   Follow up plan: Return if symptoms worsen or fail to improve.  Deliah Boston, MSN, APRN, FNP-C  Subjective:  HPI: [name] is a 72 y.o. female presenting on 07/20/2022 for Insect Bite (Multiple on left arm around elbow area - noticed Friday - now red, swollen and hot )  Patient reports multiple bites on her left arm around her elbow.  That she first noticed 3 days ago.  She is concerned as they are now red, swollen, and hot.  She has been applying calamine lotion and ice.   ROS: Negative unless specifically indicated above in HPI.   Relevant past medical history reviewed and updated as indicated.   Allergies and medications reviewed and updated.   Current Outpatient Medications:    aspirin EC 81 MG tablet, Take 1 tablet (81 mg total) by mouth daily. Swallow whole., Disp: 90 tablet, Rfl: 1   Blood Glucose Monitoring Suppl (FREESTYLE LITE) w/Device KIT, USE TWO TIMES A DAY TO TEST GLUCOSE CONTROL, Disp: 1 kit, Rfl: 0   dapagliflozin propanediol (FARXIGA) 10 MG TABS tablet, Take 1 tablet (10 mg total) by mouth daily before  breakfast., Disp: 90 tablet, Rfl: 1   FREESTYLE LITE test strip, USE 1 STRIP TO TEST TWICE A DAY, Disp: 50 strip, Rfl: 0   Insulin Pen Needle 32G X 6 MM MISC, 1 Act by Does not apply route once a week., Disp: 30 each, Rfl: 0   metFORMIN (GLUCOPHAGE-XR) 750 MG 24 hr tablet, Take 1 tablet (750 mg total) by mouth daily with breakfast., Disp: 90 tablet, Rfl: 1   olmesartan (BENICAR) 20 MG tablet, Take 1 tablet (20 mg total) by mouth daily., Disp: 90 tablet, Rfl: 1   pantoprazole (PROTONIX) 40 MG tablet, Take 1 tablet (40 mg total) by mouth 2 (two) times daily., Disp: 60 tablet, Rfl: 0   rivaroxaban (XARELTO) 2.5 MG TABS tablet, Take 1 tablet (2.5 mg total) by mouth 2 (two) times daily., Disp: 126 tablet, Rfl: 0   rOPINIRole (REQUIP) 0.5 MG tablet, TAKE 1 OR  2 TABLETS BY MOUTH DAILY AT NOON AND AT 5PM , THEN 3 TABLETS EVERY NIGHT AT BEDTIME, Disp: 360 tablet, Rfl: 0   Semaglutide,0.25 or 0.5MG /DOS, (OZEMPIC, 0.25 OR 0.5 MG/DOSE,) 2 MG/3ML SOPN, Inject 0.25 mg into the skin once a week., Disp: 3 mL, Rfl: 0   SYNTHROID 112 MCG tablet, Take 1 tablet (112 mcg total) by mouth daily with breakfast. Annual appt due in June must see provider for future refills, Disp: 90 tablet, Rfl: 0   acetaminophen (TYLENOL) 500 MG tablet, Take 1,000 mg by mouth 3 (three) times daily., Disp: , Rfl:  nitroGLYCERIN (NITROSTAT) 0.4 MG SL tablet, Place 1 tablet (0.4 mg total) under the tongue every 5 (five) minutes as needed for chest pain. (Patient not taking: Reported on 07/20/2022), Disp: 25 tablet, Rfl: 3   oxyCODONE (ROXICODONE) 5 MG immediate release tablet, Take 1 tablet (5 mg total) by mouth every 6 (six) hours as needed for up to 6 doses for breakthrough pain. (Patient not taking: Reported on 07/20/2022), Disp: 6 tablet, Rfl: 0   rosuvastatin (CRESTOR) 10 MG tablet, Take 1 tablet (10 mg total) by mouth daily. (Patient not taking: Reported on 07/20/2022), Disp: 90 tablet, Rfl: 1  Allergies  Allergen Reactions   Hydralazine  Anaphylaxis, Anxiety, Dermatitis, Rash and Other (See Comments)    Flushing, Anxiety, Tremors    Ciprofloxacin Nausea Only   Propofol Other (See Comments)    Agitated, combative   Ritalin [Methylphenidate Hcl] Other (See Comments)    "climbed the walls"    Objective:   BP (!) 148/80   Pulse 68   Temp 98.4 F (36.9 C)   Resp 20   Ht 4\' 11"  (1.499 m)   Wt 182 lb (82.6 kg)   BMI 36.76 kg/m    Physical Exam Vitals reviewed.  Constitutional:      General: She is not in acute distress.    Appearance: Normal appearance. She is not ill-appearing, toxic-appearing or diaphoretic.  HENT:     Head: Normocephalic and atraumatic.  Eyes:     General: No scleral icterus.       Right eye: No discharge.        Left eye: No discharge.     Conjunctiva/sclera: Conjunctivae normal.  Cardiovascular:     Rate and Rhythm: Normal rate.  Pulmonary:     Effort: Pulmonary effort is normal. No respiratory distress.  Musculoskeletal:        General: Normal range of motion.     Cervical back: Normal range of motion.  Skin:    General: Skin is warm and dry.     Capillary Refill: Capillary refill takes less than 2 seconds.     Findings: Rash (left elbow/forearm with erythema and warmth) present. Rash is papular and vesicular.  Neurological:     General: No focal deficit present.     Mental Status: She is alert and oriented to person, place, and time. Mental status is at baseline.  Psychiatric:        Mood and Affect: Mood normal.        Behavior: Behavior normal.        Thought Content: Thought content normal.        Judgment: Judgment normal.

## 2022-07-21 ENCOUNTER — Telehealth: Payer: Self-pay | Admitting: Internal Medicine

## 2022-07-21 ENCOUNTER — Other Ambulatory Visit: Payer: Self-pay | Admitting: Internal Medicine

## 2022-07-21 DIAGNOSIS — E039 Hypothyroidism, unspecified: Secondary | ICD-10-CM

## 2022-07-21 DIAGNOSIS — N1831 Chronic kidney disease, stage 3a: Secondary | ICD-10-CM

## 2022-07-21 DIAGNOSIS — E118 Type 2 diabetes mellitus with unspecified complications: Secondary | ICD-10-CM

## 2022-07-21 MED ORDER — DAPAGLIFLOZIN PROPANEDIOL 10 MG PO TABS
10.0000 mg | ORAL_TABLET | Freq: Every day | ORAL | 1 refills | Status: DC
Start: 2022-07-21 — End: 2022-09-23

## 2022-07-21 NOTE — Telephone Encounter (Signed)
Patient called and states that her rx for Marcelline Deist needs to be sent to the patient assistance program.  Please fax to:  [phone]

## 2022-07-21 NOTE — Telephone Encounter (Signed)
Rx sent to [facility] for pt assistance program.

## 2022-07-27 ENCOUNTER — Other Ambulatory Visit: Payer: Self-pay | Admitting: Internal Medicine

## 2022-07-27 DIAGNOSIS — G2581 Restless legs syndrome: Secondary | ICD-10-CM

## 2022-08-15 ENCOUNTER — Other Ambulatory Visit: Payer: Self-pay | Admitting: Internal Medicine

## 2022-08-27 NOTE — Progress Notes (Signed)
Cardiology Office Note:    Date:  09/01/2022   ID:  Sharmon Revere, DOB Apr 27, 1950, MRN [id number]  PCP:  Etta Grandchild, MD  [facility] Providers Cardiologist:  Charlton Haws, MD     Referring MD: Etta Grandchild, MD   Chief Complaint:  No chief complaint on file.    Patient Profile: Chest pain  Cath 06/2021: normal coronary arteries  Mild pulmonary hypertension  RHC 06/2021: mean PAP 30 Hypertension  Hyperlipidemia  Hypothyroidism  Diverticulitis s/p partial colectomy in 2019 Diabetes mellitus GERD  Sleep apnea  Prior CV Studies: ECHO COMPLETE WO IMAGING ENHANCING AGENT 08/06/2021 EF 65-70, no RWMA, Gr 1 DD, normal RVSF, TR signal not adequate to assess PAP, trivial MR, AV sclerosis w/o AS    VQ scan 08/09/2021 Normal perfusion scan  CARDIAC CATHETERIZATION 07/24/2021 Normal coronary arteries EF 55-65 // LVEDP 12; Mild pulmonary hypertension, mean PAP 30   ZIO MONITOR 3 DAYS Patient had a min HR of 44 bpm, max HR of 131 bpm, and avg HR of 72 bpm. Predominant underlying rhythm was Sinus Rhythm.  PACs <1%, PVCs 2%    GATED SPECT MYO PERF W/LEXISCAN STRESS 1D 02/07/2018 EF 63, normal perfusion, low risk   ECHO COMPLETE WO IMAGING ENHANCING AGENT 03/22/2017 EF 60-65, PASP 33    CPET 03/06/2013 Normal functional capacity, no clear circulatory or ventilatory limitation with exercise  History of Present Illness:   [name] is a 72 y.o. female with the above problem list. Cath with no CAD and normal left sided filling pressures Right heart catheterization evidence of mild pulmonary hypertension.  Her echocardiogram demonstrated normal LV function and normal RVSF.  PA pressure could not be adequately assessed on the echocardiogram.  VQ scan was normal.  Sleep study did demonstrate severe sleep apnea.  She is now seeing Dr Mayford Knife for this She had mildly abnormal ABI's 0.90 on 01/07/22 and primary put her on low dose Xarelto Wave forms all multiphasic   Her  leg pain is not vascular improved with walking and worse with prolonged sitting Discussed having a statin holiday for 4 weeks during February visit Seen in ED 06/16/22 for right flank / back pain CTA negative for PE CT renal stone study negative Limited relief with muscle relaxant Given lidocaine patch and Roxicodone    She is describing severe restless leg and neuropathy      Past Medical History:  Diagnosis Date   Acute pyelonephritis 05/02/2012   Asthma    Diverticulitis 03/24/2017   see report [facility] [place] [facility]   Dyspnea    nl PFTs, and echo   GERD (gastroesophageal reflux disease)    Headache(784.0)    Heart murmur    History of echocardiogram    Echocardiogram 7/23: EF 65-70, no RWMA, Gr 1 DD, normal RVSF, inadequate TR signal to est PASP, trivial MR, AV sclerosis w/o AS   History of kidney stones    IBS (irritable bowel syndrome)    Nephrolithiasis    Pre-diabetes 12/2013   Recurrent oral ulcers    Thyroid goiter    I 131 rx and then  total throidectomy 2005 [facility]   Current Medications: Current Meds  Medication Sig   acetaminophen (TYLENOL) 500 MG tablet Take 1,000 mg by mouth 3 (three) times daily.   aspirin EC 81 MG tablet Take 1 tablet (81 mg total) by mouth daily. Swallow whole.   Blood Glucose Monitoring Suppl (FREESTYLE LITE) w/Device KIT USE TWO TIMES A DAY TO TEST  GLUCOSE CONTROL   dapagliflozin propanediol (FARXIGA) 10 MG TABS tablet Take 1 tablet (10 mg total) by mouth daily before breakfast.   FREESTYLE LITE test strip USE 1 STRIP TO TEST TWICE A DAY   Insulin Pen Needle 32G X 6 MM MISC 1 Act by Does not apply route once a week.   levocetirizine (XYZAL) 5 MG tablet Take 1 tablet (5 mg total) by mouth every evening.   olmesartan (BENICAR) 20 MG tablet TAKE 1 TABLET BY MOUTH DAILY   pantoprazole (PROTONIX) 40 MG tablet TAKE 1 TABLET BY MOUTH 2 TIMES A DAY   rivaroxaban (XARELTO) 2.5 MG TABS tablet Take 1 tablet (2.5 mg total) by mouth 2 (two) times daily.    rOPINIRole (REQUIP) 0.5 MG tablet TAKE 1 TO 2 TABLETS BY MOUTH DAILY AT NOON AND AT 5PM, THEN TAKE 3 TABLETS EVERY NIGHT AT BEDTIME   SYNTHROID 112 MCG tablet TAKE 1 TABLET DAILY WITH BREAKFAST FOR HYPOTHYROIDISM  (ANNUAL APPT DUE IN JUNE MUST SEE PROVIDER FOR FUTURE REFILLS)    Allergies:   Hydralazine, Ciprofloxacin, Propofol, and Ritalin [methylphenidate hcl]   Social History   Tobacco Use   Smoking status: Never   Smokeless tobacco: Never  Vaping Use   Vaping status: Never Used  Substance Use Topics   Alcohol use: Yes    Comment: seldom   Drug use: No    Family Hx: The patient's family history includes Aneurysm in her father; Arthritis in an other family member; COPD in her sister; Cancer in her mother and another family member; Coronary artery disease in her father and sister; Diabetes in an other family member; Heart disease in an other family member; Hyperlipidemia in an other family member; Hypertension in an other family member; Stroke in an other family member.  ROS - See HPI  EKGs/Labs/Other Test Reviewed:    EKG:    Recent Labs: 06/16/2022: ALT 39; BUN 24; Creatinine, Ser 1.00; Hemoglobin 15.0; Platelets 258; Potassium 3.9; Sodium 139 06/24/2022: TSH 2.16   Recent Lipid Panel Recent Labs    01/06/22 0856  CHOL 144  TRIG 193.0*  HDL 50.00  VLDL 38.6  LDLCALC 55     Risk Assessment/Calculations/Metrics:     STOP-Bang Score:              Physical Exam:    VS:  BP 138/80   Pulse 65   Ht 4\' 11"  (1.499 m)   Wt 180 lb 9.6 oz (81.9 kg)   SpO2 95%   BMI 36.48 kg/m     Wt Readings from Last 3 Encounters:  09/01/22 180 lb 9.6 oz (81.9 kg)  07/20/22 182 lb (82.6 kg)  06/24/22 182 lb (82.6 kg)     Affect appropriate Healthy:  appears stated age HEENT: normal Neck supple with no adenopathy JVP normal no bruits no thyromegaly Lungs clear with no wheezing and good diaphragmatic motion Heart:  S1/S2 no murmur, no rub, gallop or click PMI  normal Abdomen: benighn, BS positve, no tenderness, no AAA no bruit.  No HSM or HJR Distal pulses intact with no bruits No edema Neuro non-focal Skin warm and dry No muscular weakness      ASSESSMENT & PLAN:    OSA:  severe f/u Dr Mayford Knife for CPAP and titration likely responsible for mild pulmonary HTN noted at cath In regard to dyspnea EF normal on TTE 08/06/21 no signs of cor pulmonale V/Q negative and no significant valve ds Chest Pain:  non cardiac normal cors  on cath 07/24/21  DM: Discussed low carb diet.  Target hemoglobin A1c is 6.5 or less.  Continue current medications. HTN:  Well controlled.  Continue current medications and low sodium Dash type diet.   HLD:  Hold statin for 4 weeks LDL 55 01/06/22  Thyroid:  on synthroid replacement TSH normal 01/06/22  PVD:  very mild with resting ABI's 0.90 01/07/22 and multi phasic waveforms TBI:s abnormal On low dose xarelto per primary She is on Requip for restless leg statin holiday for pain but this is not claudication Will further explore this with full LE arterial duplex study and consider exercise ABI's I think she would benefit from MRI imaging of her lumbar spine per primary    Medication Adjustments/Labs and Tests Ordered:  LE arterial duplex  F/U in 6 months   Tests Ordered: Orders Placed This Encounter  Procedures   VAS [place] LOWER EXTREMITY ARTERIAL DUPLEX   Medication Changes: No orders of the defined types were placed in this encounter.  Signed, Charlton Haws, MD  09/01/2022 9:28 AM    [facility] [address] [place], [place], [place]  [address] Phone: [phone]; Fax: [phone]

## 2022-08-29 ENCOUNTER — Other Ambulatory Visit: Payer: Self-pay | Admitting: Internal Medicine

## 2022-08-29 DIAGNOSIS — I10 Essential (primary) hypertension: Secondary | ICD-10-CM

## 2022-09-01 ENCOUNTER — Encounter: Payer: Self-pay | Admitting: Cardiovascular Disease

## 2022-09-01 ENCOUNTER — Ambulatory Visit: Payer: Medicare Other | Attending: Cardiovascular Disease | Admitting: Cardiovascular Disease

## 2022-09-01 VITALS — BP 138/80 | HR 65 | Ht 59.0 in | Wt 180.6 lb

## 2022-09-01 DIAGNOSIS — I739 Peripheral vascular disease, unspecified: Secondary | ICD-10-CM | POA: Insufficient documentation

## 2022-09-01 DIAGNOSIS — Z7901 Long term (current) use of anticoagulants: Secondary | ICD-10-CM | POA: Diagnosis not present

## 2022-09-01 DIAGNOSIS — Z5181 Encounter for therapeutic drug level monitoring: Secondary | ICD-10-CM | POA: Insufficient documentation

## 2022-09-01 DIAGNOSIS — E782 Mixed hyperlipidemia: Secondary | ICD-10-CM | POA: Insufficient documentation

## 2022-09-01 DIAGNOSIS — G2581 Restless legs syndrome: Secondary | ICD-10-CM | POA: Diagnosis not present

## 2022-09-01 NOTE — Patient Instructions (Addendum)
Medication Instructions:  No changes *If you need a refill on your cardiac medications before your next appointment, please call your pharmacy*   Lab Work: none   Testing/Procedures: Your physician has requested that you have a lower or extremity arterial duplex. This test is an ultrasound of the arteries in the legs. It looks at arterial blood flow in the legs. Allow one hour for Lower Arterial scans. There are no restrictions or special instructions   Follow-Up: At [facility], you and your health needs are our priority.  As part of our continuing mission to provide you with exceptional heart care, we have created designated Provider Care Teams.  These Care Teams include your primary Cardiologist (physician) and Advanced Practice Providers (APPs -  Physician Assistants and Nurse Practitioners) who all work together to provide you with the care you need, when you need it.  Your next appointment:   6 month(s)  Provider:   Charlton Haws, MD

## 2022-09-02 ENCOUNTER — Other Ambulatory Visit (HOSPITAL_COMMUNITY): Payer: Self-pay | Admitting: Cardiovascular Disease

## 2022-09-02 ENCOUNTER — Telehealth: Payer: Self-pay | Admitting: Internal Medicine

## 2022-09-02 DIAGNOSIS — I739 Peripheral vascular disease, unspecified: Secondary | ICD-10-CM

## 2022-09-02 NOTE — Telephone Encounter (Signed)
Pt called about lab results and wanted to know do she need to make an OV. Please advise.

## 2022-09-02 NOTE — Telephone Encounter (Signed)
"   [place] ---  Your A1c is at 7.2%.  You mentioned weight gain.  Let me know if you are willing to start using Mounjaro.  The other labs are okay. .. No mention of ov.Marland KitchenRaechel Chute

## 2022-09-03 ENCOUNTER — Ambulatory Visit (HOSPITAL_COMMUNITY)
Admission: RE | Admit: 2022-09-03 | Discharge: 2022-09-03 | Disposition: A | Discharge status: Home / Self Care | Payer: Medicare Other | Source: Ambulatory Visit | Attending: Internal Medicine | Admitting: Internal Medicine

## 2022-09-03 ENCOUNTER — Encounter (HOSPITAL_COMMUNITY): Payer: Medicare Other

## 2022-09-03 DIAGNOSIS — I739 Peripheral vascular disease, unspecified: Secondary | ICD-10-CM | POA: Diagnosis not present

## 2022-09-03 LAB — VAS US ABI WITH/WO TBI
Left ABI: 1.04
Right ABI: 1.04

## 2022-09-03 NOTE — Telephone Encounter (Signed)
Pt called stating MD Charlton Haws recommended for pt to get an MRI done and maybe check on her vitamin D. Pt also stated MD Eden Emms will be sending over her results. Please advise.

## 2022-09-07 ENCOUNTER — Ambulatory Visit (INDEPENDENT_AMBULATORY_CARE_PROVIDER_SITE_OTHER): Payer: Medicare Other | Admitting: Internal Medicine

## 2022-09-07 ENCOUNTER — Ambulatory Visit (INDEPENDENT_AMBULATORY_CARE_PROVIDER_SITE_OTHER): Payer: Medicare Other

## 2022-09-07 ENCOUNTER — Encounter: Payer: Self-pay | Admitting: Internal Medicine

## 2022-09-07 VITALS — BP 132/72 | HR 82 | Temp 97.9°F | Resp 16 | Ht 59.0 in | Wt 182.0 lb

## 2022-09-07 DIAGNOSIS — R29898 Other symptoms and signs involving the musculoskeletal system: Secondary | ICD-10-CM

## 2022-09-07 DIAGNOSIS — M8580 Other specified disorders of bone density and structure, unspecified site: Secondary | ICD-10-CM | POA: Diagnosis not present

## 2022-09-07 DIAGNOSIS — M5416 Radiculopathy, lumbar region: Secondary | ICD-10-CM | POA: Insufficient documentation

## 2022-09-07 DIAGNOSIS — E559 Vitamin D deficiency, unspecified: Secondary | ICD-10-CM

## 2022-09-07 DIAGNOSIS — M545 Low back pain, unspecified: Secondary | ICD-10-CM | POA: Diagnosis not present

## 2022-09-07 DIAGNOSIS — M5136 Other intervertebral disc degeneration, lumbar region: Secondary | ICD-10-CM | POA: Diagnosis not present

## 2022-09-07 LAB — VITAMIN D 25 HYDROXY (VIT D DEFICIENCY, FRACTURES): VITD: 22.59 ng/mL — ABNORMAL LOW (ref 30.00–100.00)

## 2022-09-07 NOTE — Telephone Encounter (Signed)
Is this about LBP? Please come in

## 2022-09-07 NOTE — Progress Notes (Unsigned)
Subjective:  Patient ID: [name], female    DOB: July 06, 1950  Age: 72 y.o. MRN: [id number]  CC: Back Pain   HPI [name] presents for f/up ----  Discussed the use of AI scribe software for clinical note transcription with the patient, who gave verbal consent to proceed.  History of Present Illness   The patient presents with a longstanding history of low back pain, which has been progressively worsening over the years. The pain, which originated from a fall during their youth, has been radiating into both lower extremities for the past couple of years. They describe the pain as severe enough to cause crying and report that it has been worsening.  The patient has been managing the pain with daily Tylenol (six pills a day), but reports minimal relief from this regimen. They have a history of taking ropinirole and oxycodone, the latter possibly prescribed post knee surgery, but currently, they are not on any specific medication for the back pain.  In addition to the pain, the patient also experiences numbness, weakness, and tingling in both legs and feet. They report that these symptoms have been worsening and causing significant distress. Despite the severity of the symptoms, the patient expresses a reluctance to take stronger medication due to a general dislike for medicine.  The patient also has a diagnosis of sleep apnea, which they manage with a machine. They report difficulty sleeping, but attribute this to the sleep apnea rather than the back pain. The patient has not had any recent injuries to the back.       Outpatient Medications Prior to Visit  Medication Sig Dispense Refill   acetaminophen (TYLENOL) 500 MG tablet Take 1,000 mg by mouth 3 (three) times daily.     aspirin EC 81 MG tablet Take 1 tablet (81 mg total) by mouth daily. Swallow whole. 90 tablet 1   Blood Glucose Monitoring Suppl (FREESTYLE LITE) w/Device KIT USE TWO TIMES A DAY TO TEST GLUCOSE CONTROL 1  kit 0   dapagliflozin propanediol (FARXIGA) 10 MG TABS tablet Take 1 tablet (10 mg total) by mouth daily before breakfast. 90 tablet 1   FREESTYLE LITE test strip USE 1 STRIP TO TEST TWICE A DAY 50 strip 0   Insulin Pen Needle 32G X 6 MM MISC 1 Act by Does not apply route once a week. 30 each 0   levocetirizine (XYZAL) 5 MG tablet Take 1 tablet (5 mg total) by mouth every evening. 30 tablet 0   metFORMIN (GLUCOPHAGE-XR) 750 MG 24 hr tablet Take 1 tablet (750 mg total) by mouth daily with breakfast. 90 tablet 1   nitroGLYCERIN (NITROSTAT) 0.4 MG SL tablet Place 1 tablet (0.4 mg total) under the tongue every 5 (five) minutes as needed for chest pain. 25 tablet 3   olmesartan (BENICAR) 20 MG tablet TAKE 1 TABLET BY MOUTH DAILY 90 tablet 0   pantoprazole (PROTONIX) 40 MG tablet TAKE 1 TABLET BY MOUTH 2 TIMES A DAY 60 tablet 0   rOPINIRole (REQUIP) 0.5 MG tablet TAKE 1 TO 2 TABLETS BY MOUTH DAILY AT NOON AND AT 5PM, THEN TAKE 3 TABLETS EVERY NIGHT AT BEDTIME 360 tablet 0   rosuvastatin (CRESTOR) 10 MG tablet Take 1 tablet (10 mg total) by mouth daily. 90 tablet 1   Semaglutide,0.25 or 0.5MG /DOS, (OZEMPIC, 0.25 OR 0.5 MG/DOSE,) 2 MG/3ML SOPN Inject 0.25 mg into the skin once a week. 3 mL 0   SYNTHROID 112 MCG tablet TAKE 1 TABLET DAILY WITH  BREAKFAST FOR HYPOTHYROIDISM  (ANNUAL APPT DUE IN JUNE MUST SEE PROVIDER FOR FUTURE REFILLS) 90 tablet 0   oxyCODONE (ROXICODONE) 5 MG immediate release tablet Take 1 tablet (5 mg total) by mouth every 6 (six) hours as needed for up to 6 doses for breakthrough pain. 6 tablet 0   rivaroxaban (XARELTO) 2.5 MG TABS tablet Take 1 tablet (2.5 mg total) by mouth 2 (two) times daily. 126 tablet 0   No facility-administered medications prior to visit.    ROS Review of Systems  Constitutional: Negative.  Negative for chills, diaphoresis, fatigue and fever.  HENT: Negative.    Eyes: Negative.   Respiratory:  Negative for cough, chest tightness, shortness of breath and  wheezing.   Cardiovascular:  Negative for chest pain, palpitations and leg swelling.  Gastrointestinal:  Negative for abdominal pain, blood in stool, constipation, diarrhea, nausea and vomiting.  Endocrine: Negative.   Genitourinary: Negative.  Negative for difficulty urinating.  Musculoskeletal:  Positive for back pain. Negative for joint swelling and myalgias.  Skin: Negative.   Neurological:  Positive for weakness. Negative for dizziness and numbness.  Hematological:  Negative for adenopathy. Does not bruise/bleed easily.  Psychiatric/Behavioral: Negative.      Objective:  BP 132/72 (BP Location: Right Arm, Patient Position: Sitting, Cuff Size: Large)   Pulse 82   Temp 97.9 F (36.6 C) (Oral)   Resp 16   Ht 4\' 11"  (1.499 m)   Wt 182 lb (82.6 kg)   SpO2 94%   BMI 36.76 kg/m   BP Readings from Last 3 Encounters:  09/07/22 132/72  09/01/22 138/80  07/20/22 (!) 148/80    Wt Readings from Last 3 Encounters:  09/07/22 182 lb (82.6 kg)  09/01/22 180 lb 9.6 oz (81.9 kg)  07/20/22 182 lb (82.6 kg)    Physical Exam Vitals reviewed.  Constitutional:      Appearance: Normal appearance.  HENT:     Nose: Nose normal.     Mouth/Throat:     Mouth: Mucous membranes are moist.  Eyes:     General: No scleral icterus.    Conjunctiva/sclera: Conjunctivae normal.  Cardiovascular:     Rate and Rhythm: Normal rate and regular rhythm.     Heart sounds: No murmur heard. Pulmonary:     Effort: Pulmonary effort is normal.     Breath sounds: No stridor. No wheezing, rhonchi or rales.  Abdominal:     General: Abdomen is flat.     Palpations: There is no mass.     Tenderness: There is no abdominal tenderness. There is no guarding.     Hernia: No hernia is present.  Musculoskeletal:        General: Normal range of motion.     Cervical back: Neck supple.     Right lower leg: No edema.     Left lower leg: No edema.  Lymphadenopathy:     Cervical: No cervical adenopathy.  Skin:     General: Skin is warm and dry.     Coloration: Skin is not pale.  Neurological:     Mental Status: She is alert.     Motor: Weakness present.     Deep Tendon Reflexes: Reflexes normal.     Reflex Scores:      Patellar reflexes are 0 on the right side and 0 on the left side.      Achilles reflexes are 0 on the right side and 0 on the left side.    Comments: Weakness  in RLE  Psychiatric:        Mood and Affect: Mood normal.        Behavior: Behavior normal.     Lab Results  Component Value Date   WBC 5.5 06/16/2022   HGB 15.0 06/16/2022   HCT 43.7 06/16/2022   PLT 258 06/16/2022   GLUCOSE 99 06/16/2022   CHOL 144 01/06/2022   TRIG 193.0 (H) 01/06/2022   HDL 50.00 01/06/2022   LDLDIRECT 142.0 01/28/2016   LDLCALC 55 01/06/2022   ALT 39 06/16/2022   AST 25 06/16/2022   NA 139 06/16/2022   K 3.9 06/16/2022   CL 102 06/16/2022   CREATININE 1.00 06/16/2022   BUN 24 (H) 06/16/2022   CO2 25 06/16/2022   TSH 2.16 06/24/2022   INR 1.12 03/17/2017   HGBA1C 7.2 (H) 06/24/2022   MICROALBUR <0.7 06/24/2022    VAS [place] LOWER EXTREMITY ARTERIAL DUPLEX  Result Date: 09/04/2022 LOWER EXTREMITY ARTERIAL DUPLEX STUDY Patient Name:  [name]  Date of Exam:   09/03/2022 Medical Rec #: [id number]         Accession #:    [id number] Date of Birth: 1950/07/30         Patient Gender: F Patient Age:   51 years Exam Location:  [facility] Procedure:      VAS [place] LOWER EXTREMITY ARTERIAL DUPLEX Referring Phys: Charlton Haws --------------------------------------------------------------------------------  Indications: Patient has had restless leg syndrome and chronic fatigue syndrome              for years. She states her legs hurt all the time. She denies true              claudication and rest pain symptoms. She does get bilateral calf              and ankle edema. High Risk Factors: Hypertension, hyperlipidemia, Diabetes, no history of                    smoking. Other Factors: SEE ABI REPORT.  Current  ABI: Right 1.04 Left 1.04 Comparison Study: None Performing Technologist: Alecia Mackin RVT, RDCS (AE), RDMS  Examination Guidelines: A complete evaluation includes B-mode imaging, spectral Doppler, color Doppler, and power Doppler as needed of all accessible portions of each vessel. Bilateral testing is considered an integral part of a complete examination. Limited examinations for reoccurring indications may be performed as noted.  +-----------+--------+-----+--------+---------+--------+ RIGHT      PSV cm/sRatioStenosisWaveform Comments +-----------+--------+-----+--------+---------+--------+ CFA Prox   146                  triphasic         +-----------+--------+-----+--------+---------+--------+ CFA Distal 73                   triphasic         +-----------+--------+-----+--------+---------+--------+ DFA        78                   triphasic         +-----------+--------+-----+--------+---------+--------+ SFA Prox   84                   triphasic         +-----------+--------+-----+--------+---------+--------+ SFA Mid    100                  triphasic         +-----------+--------+-----+--------+---------+--------+ SFA Distal 85  triphasic         +-----------+--------+-----+--------+---------+--------+ POP Prox   102                  triphasic         +-----------+--------+-----+--------+---------+--------+ POP Distal 67                   triphasic         +-----------+--------+-----+--------+---------+--------+ TP Trunk   86                   triphasic         +-----------+--------+-----+--------+---------+--------+ ATA Distal 38                   biphasic          +-----------+--------+-----+--------+---------+--------+ PTA Distal 101                  triphasic         +-----------+--------+-----+--------+---------+--------+ PERO Distal52                   triphasic          +-----------+--------+-----+--------+---------+--------+  +-----------+--------+-----+--------+---------+--------+ LEFT       PSV cm/sRatioStenosisWaveform Comments +-----------+--------+-----+--------+---------+--------+ CFA Prox   115                  triphasic         +-----------+--------+-----+--------+---------+--------+ CFA Distal 82                   triphasic         +-----------+--------+-----+--------+---------+--------+ DFA        58                   biphasic          +-----------+--------+-----+--------+---------+--------+ SFA Prox   73                   triphasic         +-----------+--------+-----+--------+---------+--------+ SFA Mid    103                  triphasic         +-----------+--------+-----+--------+---------+--------+ SFA Distal 85                   triphasic         +-----------+--------+-----+--------+---------+--------+ POP Prox   73                   triphasic         +-----------+--------+-----+--------+---------+--------+ POP Distal 65                   triphasic         +-----------+--------+-----+--------+---------+--------+ TP Trunk   103                  triphasic         +-----------+--------+-----+--------+---------+--------+ ATA Distal 38                   biphasic          +-----------+--------+-----+--------+---------+--------+ PTA Distal 94                   triphasic         +-----------+--------+-----+--------+---------+--------+ PERO Distal46                   biphasic          +-----------+--------+-----+--------+---------+--------+  Summary: Right: No evidence of stenosis seen. Left: No evidence of stenosis seen.  See table(s) above for measurements and observations. Electronically signed by Dina Rich MD on 09/04/2022 at 9:20:19 AM.    Final    VAS [place] ABI WITH/WO TBI  Result Date: 09/03/2022  LOWER EXTREMITY DOPPLER STUDY Patient Name:  [name]  Date of Exam:    09/03/2022 Medical Rec #: [id number]         Accession #:    [id number] Date of Birth: 1950-11-04         Patient Gender: F Patient Age:   26 years Exam Location:  [facility] Procedure:      VAS [place] ABI WITH/WO TBI Referring Phys: Charlton Haws --------------------------------------------------------------------------------  Indications: Patient has had restless leg syndrome and chronic fatigue syndrome              for years. She states her legs hurt all the time. She denies true              claudication and rest pain symptoms. She does get bilateral calf              and ankle edema. High Risk Factors: Hypertension, hyperlipidemia, Diabetes, no history of                    smoking. Other Factors: SEE BILATERAL LEG ARTERIAL DUPLEX REPORT                 History of OSA.  Vascular Interventions: None. Comparison Study: Prior ABI 01/07/2022 Right .90 Left .90 Performing Technologist: Carlos American RVT  Examination Guidelines: A complete evaluation includes at minimum, Doppler waveform signals and systolic blood pressure reading at the level of bilateral brachial, anterior tibial, and posterior tibial arteries, when vessel segments are accessible. Bilateral testing is considered an integral part of a complete examination. Photoelectric Plethysmograph (PPG) waveforms and toe systolic pressure readings are included as required and additional duplex testing as needed. Limited examinations for reoccurring indications may be performed as noted.  ABI Findings: +---------+------------------+-----+----------------+--------+ Right    Rt Pressure (mmHg)IndexWaveform        Comment  +---------+------------------+-----+----------------+--------+ Brachial 154                                             +---------+------------------+-----+----------------+--------+ PTA      168               1.04 barely triphasic         +---------+------------------+-----+----------------+--------+ DP       148               0.92  monophasic               +---------+------------------+-----+----------------+--------+ Great Toe113               0.70 Normal                   +---------+------------------+-----+----------------+--------+ +---------+------------------+-----+-----------+-------+ Left     Lt Pressure (mmHg)IndexWaveform   Comment +---------+------------------+-----+-----------+-------+ Brachial 161                                       +---------+------------------+-----+-----------+-------+ PTA      167  1.04 triphasic          +---------+------------------+-----+-----------+-------+ DP       160               0.99 multiphasic        +---------+------------------+-----+-----------+-------+ Great Toe151               0.94 Normal             +---------+------------------+-----+-----------+-------+ +-------+-----------+-----------+------------+------------+ ABI/TBIToday's ABIToday's TBIPrevious ABIPrevious TBI +-------+-----------+-----------+------------+------------+ Right  1.04       .70        .90         .48          +-------+-----------+-----------+------------+------------+ Left   1.04       .94        .90         .56          +-------+-----------+-----------+------------+------------+  Bilateral ABIs and TBIs appear increased compared to prior study on 01/07/2022.  Summary: Right: Resting right ankle-brachial index is within normal range. The right toe-brachial index is normal. Left: Resting left ankle-brachial index is within normal range. The left toe-brachial index is normal. *See table(s) above for measurements and observations.  Electronically signed by Dina Rich MD on 09/03/2022 at 5:43:10 PM.    Final    DG Lumbar Spine Complete  Result Date: 09/07/2022 CLINICAL DATA:  Worsening low back pain EXAM: LUMBAR SPINE - COMPLETE 4 VIEW COMPARISON:  06/16/2022 abdominal CT FINDINGS: Mild generalized disc space narrowing and endplate spurring.  Degenerative facet spurring at L4-5 and L5-S1. Subjective osteopenia. IMPRESSION: Ordinary degenerative changes without acute or focal finding. Electronically Signed   By: Tiburcio Pea M.D.   On: 09/07/2022 14:42     Assessment & Plan:  Lumbar radiculitis -     DG Lumbar Spine Complete; Future -     MR LUMBAR SPINE WO CONTRAST; Future  Right leg weakness- Will evaluate for spinal stenosis, nerve impingement, disc herniation (something that would be treated surgically.) -     DG Lumbar Spine Complete; Future -     MR LUMBAR SPINE WO CONTRAST; Future  VITAMIN D DEFICIENCY -     VITAMIN D 25 Hydroxy (Vit-D Deficiency, Fractures); Future -     Vitamin D3; Take 1 capsule (2,000 Units total) by mouth daily.  Dispense: 90 capsule; Refill: 1  Osteopenia, unspecified location -     Vitamin D3; Take 1 capsule (2,000 Units total) by mouth daily.  Dispense: 90 capsule; Refill: 1     Follow-up: Return in about 3 months (around 12/08/2022).  Sanda Linger, MD

## 2022-09-07 NOTE — Telephone Encounter (Signed)
Patient would like a call back from a nurse to discuss what her other provider said. She wants to get started with what is needed as soon as possible. Best callback is [phone].

## 2022-09-07 NOTE — Patient Instructions (Signed)
Spondylolysis  Spondylolysis is a small break or crack (stress fracture) in a bone in the spine (vertebra) in the lower back (lumbar spine). The stress fracture occurs on the bony mass between and behind the vertebra. Spondylolysis may be caused by an injury (trauma) or by overuse. Since the lower back is almost always under pressure from daily living, this stress fracture usually does not heal normally. Spondylolysis may eventually cause one vertebra to slip forward and out of place (spondylolisthesis). What are the causes? This condition may be caused by: Trauma, such as a fall. Excessive wear and tear. This often results from doing sports or physical activities that involve repetitive overstretching and rotation of the spine. What increases the risk? You are more likely to develop this condition if you have: A family history of this condition. An inward curvature of your spine (lordosis). A condition that affects your spine, such as spina bifida. You are more likely to develop this condition if you participate in: Gymnastics or dance. Football. Wrestling or martial arts. Weight lifting. Tennis. Swimming. What are the signs or symptoms? Symptoms of this condition may include: Long-lasting (chronic) pain in the lower back. Stiffness in the back or legs. Tightness in the backs of the thighs (hamstring muscles). In some cases, there may be no symptoms. How is this diagnosed? This condition may be diagnosed based on: Your symptoms. Your medical history. A physical exam. Imaging tests, such as: X-rays. A CT scan. An MRI. How is this treated? This condition may be treated with: Rest. You may be asked to avoid or change activities that put strain on your back until your symptoms improve. Pain medicines, or medicines such as NSAIDs to help with swelling and discomfort. Injections of steroid medicine in your back to relieve pain, swelling, and numbness. A brace to stabilize and  support your back. Physical therapy. You may work with an occupational therapist or physical therapist who can teach you how to reduce pressure on your back while you do activities. Surgery. This may be needed if you have: A severe injury. Pain that lasts for more than 6 months. Numbness in your pelvic region. Changes in your ability to control your bladder or bowels. Follow these instructions at home: Medicines Take over-the-counter and prescription medicines only as told by your health care provider. Ask your health care provider if the medicine prescribed to you: Requires you to avoid driving or using heavy machinery. Can cause constipation. You may need to take these actions to prevent or treat constipation: Drink enough fluid to keep your urine pale yellow. Take over-the-counter or prescription medicines. Eat foods that are high in fiber, such as beans, whole grains, and fresh fruits and vegetables. Limit foods that are high in fat and processed sugars, such as fried or sweet foods. If you have a removable brace: Wear the brace as told by your health care provider. Remove it only as told by your health care provider. Keep the brace clean. If the brace is not waterproof: Do not let it get wet. Cover it with a watertight covering when you take a bath or a shower. Activity Rest and return to your normal activities as told by your health care provider. Ask your health care provider what activities are safe for you. Ask your health care provider when it is safe to drive if you have a back brace. Work with a physical therapist to make a safe exercise program as told by your health care provider. Do exercises as told  by your physical therapist. This may include exercises to strengthen your back and abdominal muscles (core exercises). Managing pain, stiffness, and swelling     If directed, put ice on the affected area. If you have a removable brace, remove it as told by your health care  provider. Put ice in a plastic bag. Place a towel between your skin and the bag. Leave the ice on for 20 minutes, 2-3 times a day. If your skin turns bright red, remove the ice right away to prevent skin damage. The risk of skin damage is higher if you cannot feel pain, heat, or cold. If directed, apply heat to the affected area as often as told by your health care provider. Use the heat source that your health care provider recommends, such as a moist heat pack or a heating pad. If you have a removable brace, remove it as told by your health care provider. Place a towel between your skin and the heat source. Leave the heat on for 20-30 minutes. If your skin turns bright red, remove the heat right away to prevent burns. The risk of burns is higher if you cannot feel pain, heat, or cold. General instructions Do not use any products that contain nicotine or tobacco. These products include cigarettes, chewing tobacco, and vaping devices, such as e-cigarettes. These can delay bone healing. If you need help quitting, ask your health care provider. Maintain a healthy weight. Extra weight puts stress on your back. Keep all follow-up visits. Your health care provider will monitor if your fracture is healing and help you manage your pain. Contact a health care provider if: You have pain that gets worse or does not get better. Get help right away if: You have severe back pain. Your ability to control your bowel or bladder changes. You develop weakness or numbness in your legs. You cannot stand or walk. Summary Spondylolysis is a small break or crack (stress fracture) in a bone in the spine (vertebra) in the lower back (lumbar spine). This condition may be treated by resting, medicines, physical therapy, wearing a brace, or surgery. Rest and return to your normal activities as told by your health care provider. Contact a health care provider if you have pain that gets worse or does not get  better. This information is not intended to replace advice given to you by your health care provider. Make sure you discuss any questions you have with your health care provider. Document Revised: 03/27/2021 Document Reviewed: 03/27/2021 Elsevier Patient Education  2024 [employer].

## 2022-09-07 NOTE — Telephone Encounter (Signed)
Pt scheduled for today 8/13 @ 1.20pm

## 2022-09-08 ENCOUNTER — Telehealth: Payer: Self-pay | Admitting: Internal Medicine

## 2022-09-08 MED ORDER — VITAMIN D3 50 MCG (2000 UT) PO CAPS
2000.0000 [IU] | ORAL_CAPSULE | Freq: Every day | ORAL | 1 refills | Status: DC
Start: 2022-09-08 — End: 2023-03-29

## 2022-09-08 NOTE — Telephone Encounter (Signed)
Patient called and states that her MRI can not be done at [facility] imaging until the end of September.  Can the order be changed to [facility] or somewhere else.  Please call patient and advise.  Phone:  [phone]

## 2022-09-09 ENCOUNTER — Ambulatory Visit: Payer: Medicare Other | Admitting: Internal Medicine

## 2022-09-09 ENCOUNTER — Other Ambulatory Visit: Payer: Self-pay

## 2022-09-09 ENCOUNTER — Emergency Department (HOSPITAL_BASED_OUTPATIENT_CLINIC_OR_DEPARTMENT_OTHER)
Admission: EM | Admit: 2022-09-09 | Discharge: 2022-09-09 | Disposition: A | Discharge status: Home / Self Care | Payer: Medicare Other | Attending: Emergency Medicine | Admitting: Emergency Medicine

## 2022-09-09 ENCOUNTER — Encounter (HOSPITAL_BASED_OUTPATIENT_CLINIC_OR_DEPARTMENT_OTHER): Payer: Self-pay | Admitting: Emergency Medicine

## 2022-09-09 DIAGNOSIS — Z7982 Long term (current) use of aspirin: Secondary | ICD-10-CM | POA: Diagnosis not present

## 2022-09-09 DIAGNOSIS — Z794 Long term (current) use of insulin: Secondary | ICD-10-CM | POA: Diagnosis not present

## 2022-09-09 DIAGNOSIS — J45909 Unspecified asthma, uncomplicated: Secondary | ICD-10-CM | POA: Diagnosis not present

## 2022-09-09 DIAGNOSIS — Z7984 Long term (current) use of oral hypoglycemic drugs: Secondary | ICD-10-CM | POA: Insufficient documentation

## 2022-09-09 DIAGNOSIS — W57XXXA Bitten or stung by nonvenomous insect and other nonvenomous arthropods, initial encounter: Secondary | ICD-10-CM | POA: Insufficient documentation

## 2022-09-09 DIAGNOSIS — S40862A Insect bite (nonvenomous) of left upper arm, initial encounter: Secondary | ICD-10-CM | POA: Insufficient documentation

## 2022-09-09 DIAGNOSIS — L539 Erythematous condition, unspecified: Secondary | ICD-10-CM | POA: Diagnosis not present

## 2022-09-09 MED ORDER — DIPHENHYDRAMINE-ZINC ACETATE 2-0.1 % EX CREA: 1.0000 | TOPICAL_CREAM | Freq: Three times a day (TID) | CUTANEOUS | 0 refills | Status: DC | PRN

## 2022-09-09 MED ORDER — DOXYCYCLINE HYCLATE 100 MG PO CAPS: 100.0000 mg | ORAL_CAPSULE | Freq: Two times a day (BID) | ORAL | 0 refills | Status: DC

## 2022-09-09 NOTE — ED Triage Notes (Signed)
Pt via pov from home with insect bite to her left upper arm. Pt thinks it happened last night or the night before. Pt has redness and swelling to the arm. Pt alert & oriented, nad noted.

## 2022-09-09 NOTE — ED Provider Notes (Signed)
[place] EMERGENCY DEPARTMENT AT [facility] Provider Note   CSN: [id number] Arrival date & time: 09/09/22  1014     History  Chief Complaint  Patient presents with   Insect Bite    [name] is a 72 y.o. female.  The history is provided by the patient and medical records. No language interpreter was used.     72 year old female significant history of prediabetes, GERD, asthma presenting with concerns of infected insect bite.  Patient reported 2 days ago when she was outside something bit her on her left upper arm.  Since then she noticed increasing swelling redness and itchiness to the affected area.  The redness is continued to spread which concerns her.  She tries using calamine lotion and some over-the-counter spray with some relief.  She does not endorse any fever or chills no chest pain no trouble breathing no tongue swelling or lip swelling.  She is up-to-date with tetanus.  She denies any specific trauma.  Home Medications Prior to Admission medications   Medication Sig Start Date End Date Taking? Authorizing Provider  acetaminophen (TYLENOL) 500 MG tablet Take 1,000 mg by mouth 3 (three) times daily.    [provider]  aspirin EC 81 MG tablet Take 1 tablet (81 mg total) by mouth daily. Swallow whole. 06/24/22   Etta Grandchild, MD  Blood Glucose Monitoring Suppl (FREESTYLE LITE) w/Device KIT USE TWO TIMES A DAY TO TEST GLUCOSE CONTROL 06/30/21   Panosh, Neta Mends, MD  Cholecalciferol (VITAMIN D3) 50 MCG (2000 UT) capsule Take 1 capsule (2,000 Units total) by mouth daily. 09/08/22   Etta Grandchild, MD  dapagliflozin propanediol (FARXIGA) 10 MG TABS tablet Take 1 tablet (10 mg total) by mouth daily before breakfast. 07/21/22   Etta Grandchild, MD  FREESTYLE LITE test strip USE 1 STRIP TO TEST TWICE A DAY 07/24/21   Panosh, Neta Mends, MD  Insulin Pen Needle 32G X 6 MM MISC 1 Act by Does not apply route once a week. 07/15/22   Etta Grandchild, MD  levocetirizine  (XYZAL) 5 MG tablet Take 1 tablet (5 mg total) by mouth every evening. 07/20/22   Gwenlyn Fudge, FNP  metFORMIN (GLUCOPHAGE-XR) 750 MG 24 hr tablet Take 1 tablet (750 mg total) by mouth daily with breakfast. 01/06/22   Etta Grandchild, MD  nitroGLYCERIN (NITROSTAT) 0.4 MG SL tablet Place 1 tablet (0.4 mg total) under the tongue every 5 (five) minutes as needed for chest pain. 07/22/21   Tereso Newcomer T, PA-C  olmesartan (BENICAR) 20 MG tablet TAKE 1 TABLET BY MOUTH DAILY 08/29/22   Etta Grandchild, MD  pantoprazole (PROTONIX) 40 MG tablet TAKE 1 TABLET BY MOUTH 2 TIMES A DAY 08/15/22   Worthy Rancher B, FNP  rOPINIRole (REQUIP) 0.5 MG tablet TAKE 1 TO 2 TABLETS BY MOUTH DAILY AT NOON AND AT 5PM, THEN TAKE 3 TABLETS EVERY NIGHT AT BEDTIME 07/27/22   Etta Grandchild, MD  rosuvastatin (CRESTOR) 10 MG tablet Take 1 tablet (10 mg total) by mouth daily. 06/24/22   Etta Grandchild, MD  Semaglutide,0.25 or 0.5MG /DOS, (OZEMPIC, 0.25 OR 0.5 MG/DOSE,) 2 MG/3ML SOPN Inject 0.25 mg into the skin once a week. 07/15/22   Etta Grandchild, MD  SYNTHROID 112 MCG tablet TAKE 1 TABLET DAILY WITH BREAKFAST FOR HYPOTHYROIDISM  [facility] APPT DUE IN JUNE MUST SEE PROVIDER FOR FUTURE REFILLS) 07/21/22   Etta Grandchild, MD      Allergies  Hydralazine, Ciprofloxacin, Propofol, and Ritalin [methylphenidate hcl]    Review of Systems   Review of Systems  All other systems reviewed and are negative.   Physical Exam Updated Vital Signs BP (!) 145/84 (BP Location: Right Arm)   Pulse 78   Temp 98.1 F (36.7 C) (Oral)   Resp 16   SpO2 96%  Physical Exam Vitals and nursing note reviewed.  Constitutional:      General: She is not in acute distress.    Appearance: She is well-developed.  HENT:     Head: Atraumatic.  Eyes:     Conjunctiva/sclera: Conjunctivae normal.  Pulmonary:     Effort: Pulmonary effort is normal.  Musculoskeletal:     Cervical back: Neck supple.  Skin:    Findings: No rash.     Comments: Left  upper arm: There is a moderate size area of skin erythema edema and warmth noted to the volar aspect of the upper arm mildly tender to palpation with no lymphangitis and no joint involvement.  Neurological:     Mental Status: She is alert.  Psychiatric:        Mood and Affect: Mood normal.     ED Results / Procedures / Treatments   Labs (all labs ordered are listed, but only abnormal results are displayed) Labs Reviewed - No data to display  EKG None  Radiology DG Lumbar Spine Complete  Result Date: 09/07/2022 CLINICAL DATA:  Worsening low back pain EXAM: LUMBAR SPINE - COMPLETE 4 VIEW COMPARISON:  06/16/2022 abdominal CT FINDINGS: Mild generalized disc space narrowing and endplate spurring. Degenerative facet spurring at L4-5 and L5-S1. Subjective osteopenia. IMPRESSION: Ordinary degenerative changes without acute or focal finding. Electronically Signed   By: Tiburcio Pea M.D.   On: 09/07/2022 14:42    Procedures Procedures    Medications Ordered in ED Medications - No data to display  ED Course/ Medical Decision Making/ A&P                                 Medical Decision Making  BP (!) 145/84 (BP Location: Right Arm)   Pulse 78   Temp 98.1 F (36.7 C) (Oral)   Resp 16   Ht 4\' 11"  (1.499 m)   Wt 81.6 kg   SpO2 96%   BMI 36.36 kg/m   26:62 AM 72 year old female significant history of prediabetes, GERD, asthma presenting with concerns of infected insect bite.  Patient reported 2 days ago when she was outside something bit her on her left upper arm.  Since then she noticed increasing swelling redness and itchiness to the affected area.  The redness is continued to spread which concerns her.  She tries using calamine lotion and some over-the-counter spray with some relief.  She does not endorse any fever or chills no chest pain no trouble breathing no tongue swelling or lip swelling.  She is up-to-date with tetanus.  She denies any specific trauma.  On exam this is a  well-appearing female resting comfortably in bed appears to be in no acute discomfort.  Her left upper extremity is remarkable for an area of erythema edema and warmth approximately 10 to 15 cm in length noted to the ventral aspect of upper arm with a localized dime sized bite mark.  This is consistent with a localized skin reaction from an insect bite and less likely to be cellulitis.  No evidence of abscess.  Low  suspicion for necrotizing fasciitis.  Vital signs overall reassuring blood pressure is elevated at 145/84.  At this time I recommend Benadryl cream for comfort.  I have considered prednisone but felt with a history of diabetes, it will provide more harm than benefits.  I also agrees to prescribe antibiotic doxycycline to have on hand in the event that his symptoms progress as she may benefit from treatment for potential cellulitis.  Patient voiced understanding and agrees with plan.  Labs imaging considered but not performed as patient is overall well-appearing.  Care discussed with Dr. Eloise Harman.  Pt d/c home with benadryl cream and doxy.  Return precaution given.           Final Clinical Impression(s) / ED Diagnoses Final diagnoses:  Insect bite of left upper arm with local reaction, initial encounter    Rx / DC Orders ED Discharge Orders          Ordered    doxycycline (VIBRAMYCIN) 100 MG capsule  2 times daily        09/09/22 1054    diphenhydrAMINE-zinc acetate (BENADRYL EXTRA STRENGTH) cream  3 times daily PRN        09/09/22 1054              Fayrene Helper, PA-C 09/09/22 1055    Rondel Baton, MD 09/09/22 1626

## 2022-09-09 NOTE — Discharge Instructions (Addendum)
You have skin changes is likely a localized reaction to a recent insect bite.  You may apply Benadryl cream to the affected area 3 times daily to help with itchiness.  If you notice worsening pain swelling redness or fever after the next 2 days, you may start taking antibiotic prescribed to treat for potential skin infection.  Return if you have any concern.

## 2022-09-11 ENCOUNTER — Ambulatory Visit (HOSPITAL_BASED_OUTPATIENT_CLINIC_OR_DEPARTMENT_OTHER)
Admission: RE | Admit: 2022-09-11 | Discharge: 2022-09-11 | Disposition: A | Discharge status: Home / Self Care | Payer: Medicare Other | Source: Ambulatory Visit | Attending: Internal Medicine | Admitting: Internal Medicine

## 2022-09-11 DIAGNOSIS — M48061 Spinal stenosis, lumbar region without neurogenic claudication: Secondary | ICD-10-CM | POA: Diagnosis not present

## 2022-09-11 DIAGNOSIS — M5416 Radiculopathy, lumbar region: Secondary | ICD-10-CM | POA: Diagnosis present

## 2022-09-11 DIAGNOSIS — R29898 Other symptoms and signs involving the musculoskeletal system: Secondary | ICD-10-CM | POA: Diagnosis not present

## 2022-09-11 DIAGNOSIS — M5116 Intervertebral disc disorders with radiculopathy, lumbar region: Secondary | ICD-10-CM | POA: Diagnosis not present

## 2022-09-11 DIAGNOSIS — M4726 Other spondylosis with radiculopathy, lumbar region: Secondary | ICD-10-CM | POA: Insufficient documentation

## 2022-09-11 DIAGNOSIS — M4727 Other spondylosis with radiculopathy, lumbosacral region: Secondary | ICD-10-CM | POA: Diagnosis not present

## 2022-09-14 ENCOUNTER — Ambulatory Visit (HOSPITAL_COMMUNITY): Payer: Medicare Other

## 2022-09-16 ENCOUNTER — Telehealth: Payer: Self-pay

## 2022-09-16 NOTE — Telephone Encounter (Signed)
Transition Care Management Follow-up Telephone Call Date of discharge and from where: [facility] 8/14 How have you been since you were released from the hospital? Doing ok and healing  Any questions or concerns? No  Items Reviewed: Did the pt receive and understand the discharge instructions provided? Yes  Medications obtained and verified? No  Other? No  Any new allergies since your discharge? No  Dietary orders reviewed? No Do you have support at home? Yes    Follow up appointments reviewed:  PCP Hospital f/u appt confirmed? No  Scheduled to see  on  @ . Specialist Hospital f/u appt confirmed? No  Scheduled to see  on  @ . Are transportation arrangements needed? Yes  If their condition worsens, is the pt aware to call PCP or go to the Emergency Dept.? Yes Was the patient provided with contact information for the PCP's office or ED? Yes Was to pt encouraged to call back with questions or concerns? Yes

## 2022-09-18 ENCOUNTER — Telehealth: Payer: Self-pay | Admitting: Internal Medicine

## 2022-09-18 NOTE — Telephone Encounter (Signed)
Patient called and said her MRI from 09/11/22 has not been read yet. She is concerned about that and would like to know if something can be done to speed up the process. Patient would like a call back at [phone].

## 2022-09-21 ENCOUNTER — Other Ambulatory Visit: Payer: Self-pay | Admitting: Internal Medicine

## 2022-09-21 DIAGNOSIS — G2581 Restless legs syndrome: Secondary | ICD-10-CM

## 2022-09-21 NOTE — Telephone Encounter (Signed)
MRI shows arthritis and arthritic changes in the lower spine joints.  Possible mild impingement of the nerves in the upper lumbar spine.  No spinal canal stenosis.  Dr. Yetta Barre would have to determine if the findings on the MRI are causing any of her symptoms or just incidental findings.

## 2022-09-21 NOTE — Telephone Encounter (Signed)
Patient is very concerned about not receiving a call about her results. She would like a call back at [phone].

## 2022-09-22 NOTE — Telephone Encounter (Signed)
Pt has been informed and expressed understanding.   Pt stated that she would like for PCP to take a look as well once he is back in office.

## 2022-09-23 ENCOUNTER — Telehealth: Payer: Self-pay | Admitting: Internal Medicine

## 2022-09-23 DIAGNOSIS — N1831 Chronic kidney disease, stage 3a: Secondary | ICD-10-CM

## 2022-09-23 DIAGNOSIS — E118 Type 2 diabetes mellitus with unspecified complications: Secondary | ICD-10-CM

## 2022-09-23 MED ORDER — DAPAGLIFLOZIN PROPANEDIOL 10 MG PO TABS
10.0000 mg | ORAL_TABLET | Freq: Every day | ORAL | 3 refills | Status: DC
Start: 2022-09-23 — End: 2023-09-22

## 2022-09-23 NOTE — Telephone Encounter (Signed)
[facility] called patient and told her that they need a new  Rx - Patient is not sure if it is the farxiga or the synthroid.  Please advise.

## 2022-09-23 NOTE — Telephone Encounter (Signed)
[name] Rx has been sent to [facility] mail order.

## 2022-09-29 ENCOUNTER — Other Ambulatory Visit: Payer: Self-pay | Admitting: Internal Medicine

## 2022-09-29 DIAGNOSIS — M48061 Spinal stenosis, lumbar region without neurogenic claudication: Secondary | ICD-10-CM | POA: Insufficient documentation

## 2022-09-29 DIAGNOSIS — M5416 Radiculopathy, lumbar region: Secondary | ICD-10-CM

## 2022-10-05 ENCOUNTER — Encounter: Payer: Self-pay | Admitting: Physical Medicine & Rehabilitation

## 2022-10-06 ENCOUNTER — Emergency Department (HOSPITAL_BASED_OUTPATIENT_CLINIC_OR_DEPARTMENT_OTHER): Payer: Medicare Other

## 2022-10-06 ENCOUNTER — Other Ambulatory Visit: Payer: Self-pay

## 2022-10-06 ENCOUNTER — Encounter (HOSPITAL_BASED_OUTPATIENT_CLINIC_OR_DEPARTMENT_OTHER): Payer: Self-pay | Admitting: Emergency Medicine

## 2022-10-06 DIAGNOSIS — R509 Fever, unspecified: Secondary | ICD-10-CM | POA: Insufficient documentation

## 2022-10-06 DIAGNOSIS — N10 Acute tubulo-interstitial nephritis: Secondary | ICD-10-CM | POA: Diagnosis not present

## 2022-10-06 DIAGNOSIS — I10 Essential (primary) hypertension: Secondary | ICD-10-CM | POA: Diagnosis not present

## 2022-10-06 DIAGNOSIS — Z20822 Contact with and (suspected) exposure to covid-19: Secondary | ICD-10-CM | POA: Diagnosis not present

## 2022-10-06 DIAGNOSIS — M545 Low back pain, unspecified: Secondary | ICD-10-CM | POA: Insufficient documentation

## 2022-10-06 DIAGNOSIS — E119 Type 2 diabetes mellitus without complications: Secondary | ICD-10-CM | POA: Insufficient documentation

## 2022-10-06 DIAGNOSIS — Z794 Long term (current) use of insulin: Secondary | ICD-10-CM | POA: Diagnosis not present

## 2022-10-06 DIAGNOSIS — N12 Tubulo-interstitial nephritis, not specified as acute or chronic: Secondary | ICD-10-CM | POA: Diagnosis not present

## 2022-10-06 DIAGNOSIS — Z7982 Long term (current) use of aspirin: Secondary | ICD-10-CM | POA: Diagnosis not present

## 2022-10-06 DIAGNOSIS — Z7984 Long term (current) use of oral hypoglycemic drugs: Secondary | ICD-10-CM | POA: Insufficient documentation

## 2022-10-06 LAB — URINALYSIS, W/ REFLEX TO CULTURE (INFECTION SUSPECTED)
Bilirubin Urine: NEGATIVE
Glucose, UA: >1000 mg/dL — AB
Ketones, ur: NEGATIVE mg/dL
Nitrite: NEGATIVE
Specific Gravity, Urine: 1.024 (ref 1.005–1.030)
WBC, UA: >50 WBC/hpf (ref 0–5)
pH: 5.5 (ref 5.0–8.0)

## 2022-10-06 LAB — CBC WITH DIFFERENTIAL/PLATELET
Abs Immature Granulocytes: 0.01 10*3/uL (ref 0.00–0.07)
Basophils Absolute: 0 10*3/uL (ref 0.0–0.1)
Basophils Relative: 0 %
Eosinophils Absolute: 0.1 10*3/uL (ref 0.0–0.5)
Eosinophils Relative: 1 %
HCT: 45 % (ref 36.0–46.0)
Hemoglobin: 15.5 g/dL — ABNORMAL HIGH (ref 12.0–15.0)
Immature Granulocytes: 0 %
Lymphocytes Relative: 17 %
Lymphs Abs: 1.3 10*3/uL (ref 0.7–4.0)
MCH: 32.9 pg (ref 26.0–34.0)
MCHC: 34.4 g/dL (ref 30.0–36.0)
MCV: 95.5 fL (ref 80.0–100.0)
Monocytes Absolute: 0.6 10*3/uL (ref 0.1–1.0)
Monocytes Relative: 8 %
Neutro Abs: 5.5 10*3/uL (ref 1.7–7.7)
Neutrophils Relative %: 74 %
Platelets: 231 10*3/uL (ref 150–400)
RBC: 4.71 MIL/uL (ref 3.87–5.11)
RDW: 13.6 % (ref 11.5–15.5)
WBC: 7.5 10*3/uL (ref 4.0–10.5)
nRBC: 0 % (ref 0.0–0.2)

## 2022-10-06 LAB — COMPREHENSIVE METABOLIC PANEL
ALT: 26 U/L (ref 0–44)
AST: 19 U/L (ref 15–41)
Albumin: 4.5 g/dL (ref 3.5–5.0)
Alkaline Phosphatase: 49 U/L (ref 38–126)
Anion gap: 10 (ref 5–15)
BUN: 15 mg/dL (ref 8–23)
CO2: 25 mmol/L (ref 22–32)
Calcium: 9.4 mg/dL (ref 8.9–10.3)
Chloride: 101 mmol/L (ref 98–111)
Creatinine, Ser: 0.86 mg/dL (ref 0.44–1.00)
GFR, Estimated: >60 mL/min (ref >60)
Glucose, Bld: 86 mg/dL (ref 70–99)
Potassium: 4 mmol/L (ref 3.5–5.1)
Sodium: 136 mmol/L (ref 135–145)
Total Bilirubin: 1.4 mg/dL — ABNORMAL HIGH (ref 0.3–1.2)
Total Protein: 7.1 g/dL (ref 6.5–8.1)

## 2022-10-06 LAB — RESP PANEL BY RT-PCR (RSV, FLU A&B, COVID)  RVPGX2
Influenza A by PCR: NEGATIVE
Influenza B by PCR: NEGATIVE
Resp Syncytial Virus by PCR: NEGATIVE
SARS Coronavirus 2 by RT PCR: NEGATIVE

## 2022-10-06 LAB — LACTIC ACID, PLASMA: Lactic Acid, Venous: 0.8 mmol/L (ref 0.5–1.9)

## 2022-10-06 MED ORDER — ACETAMINOPHEN 325 MG PO TABS
650.0000 mg | ORAL_TABLET | Freq: Once | ORAL | Status: AC | PRN
  Administered 2022-10-06: 650 mg via ORAL
  Filled 2022-10-06: qty 2

## 2022-10-06 NOTE — ED Triage Notes (Signed)
Woke up with chills and fever. Max 102 at home Home covid - Reports being "bit by something" on left elbow, (cover with band-aid with some drainage)

## 2022-10-07 ENCOUNTER — Emergency Department (HOSPITAL_BASED_OUTPATIENT_CLINIC_OR_DEPARTMENT_OTHER)
Admission: EM | Admit: 2022-10-07 | Discharge: 2022-10-07 | Disposition: A | Discharge status: Home / Self Care | Payer: Medicare Other | Attending: Emergency Medicine | Admitting: Emergency Medicine

## 2022-10-07 ENCOUNTER — Telehealth: Payer: Self-pay

## 2022-10-07 DIAGNOSIS — R509 Fever, unspecified: Secondary | ICD-10-CM | POA: Diagnosis not present

## 2022-10-07 DIAGNOSIS — N10 Acute tubulo-interstitial nephritis: Secondary | ICD-10-CM

## 2022-10-07 MED ORDER — CEPHALEXIN 500 MG PO CAPS: 500.0000 mg | ORAL_CAPSULE | Freq: Three times a day (TID) | ORAL | 0 refills | Status: DC

## 2022-10-07 MED ORDER — CEFTRIAXONE SODIUM 1 G IJ SOLR
1.0000 g | Freq: Once | INTRAMUSCULAR | Status: AC
  Administered 2022-10-07: 1 g via INTRAMUSCULAR
  Filled 2022-10-07: qty 10

## 2022-10-07 MED ORDER — LIDOCAINE HCL (PF) 1 % IJ SOLN
INTRAMUSCULAR | Status: AC
  Administered 2022-10-07: 5 mL
  Filled 2022-10-07: qty 5

## 2022-10-07 NOTE — Telephone Encounter (Signed)
Pt has called stating she is needing a refill for her pantoprazole (PROTONIX) 40 MG tablet

## 2022-10-07 NOTE — Discharge Instructions (Signed)
Begin taking Keflex as prescribed.  Take Tylenol 1000 mg rotated with ibuprofen 600 mg every 4 hours as needed for fever.  Return to the emergency department if symptoms significantly worsen or change.

## 2022-10-07 NOTE — ED Provider Notes (Signed)
[place] EMERGENCY DEPARTMENT AT [facility] Provider Note   CSN: [id number] Arrival date & time: 10/06/22  2100     History  Chief Complaint  Patient presents with   Fever    [name] is a 72 y.o. female.  Patient is a 72 year old female with past medical history of hypertension, sleep apnea, restless legs, type 2 diabetes.  Patient presenting today for evaluation of fever.  For the past day, she has felt fevered and chilled.  She took a home COVID test with negative results.  She does describe some pain in her right lower back for the past 2 days that began in the absence of any injury or trauma.  She denies any urinary complaints.  No nausea or vomiting.  She has been taking Tylenol at home with some relief.  The history is provided by the patient.       Home Medications Prior to Admission medications   Medication Sig Start Date End Date Taking? Authorizing Provider  acetaminophen (TYLENOL) 500 MG tablet Take 1,000 mg by mouth 3 (three) times daily.    [provider]  aspirin EC 81 MG tablet Take 1 tablet (81 mg total) by mouth daily. Swallow whole. 06/24/22   Etta Grandchild, MD  Blood Glucose Monitoring Suppl (FREESTYLE LITE) w/Device KIT USE TWO TIMES A DAY TO TEST GLUCOSE CONTROL 06/30/21   Panosh, Neta Mends, MD  Cholecalciferol (VITAMIN D3) 50 MCG (2000 UT) capsule Take 1 capsule (2,000 Units total) by mouth daily. 09/08/22   Etta Grandchild, MD  dapagliflozin propanediol (FARXIGA) 10 MG TABS tablet Take 1 tablet (10 mg total) by mouth daily before breakfast. 09/23/22   Etta Grandchild, MD  diphenhydrAMINE-zinc acetate (BENADRYL EXTRA STRENGTH) cream Apply 1 Application topically 3 (three) times daily as needed for itching. 09/09/22   Fayrene Helper, PA-C  doxycycline (VIBRAMYCIN) 100 MG capsule Take 1 capsule (100 mg total) by mouth 2 (two) times daily. 09/09/22   Fayrene Helper, PA-C  FREESTYLE LITE test strip USE 1 STRIP TO TEST TWICE A DAY 07/24/21   Panosh,  Neta Mends, MD  Insulin Pen Needle 32G X 6 MM MISC 1 Act by Does not apply route once a week. 07/15/22   Etta Grandchild, MD  levocetirizine (XYZAL) 5 MG tablet Take 1 tablet (5 mg total) by mouth every evening. 07/20/22   Gwenlyn Fudge, FNP  metFORMIN (GLUCOPHAGE-XR) 750 MG 24 hr tablet Take 1 tablet (750 mg total) by mouth daily with breakfast. 01/06/22   Etta Grandchild, MD  nitroGLYCERIN (NITROSTAT) 0.4 MG SL tablet Place 1 tablet (0.4 mg total) under the tongue every 5 (five) minutes as needed for chest pain. 07/22/21   Tereso Newcomer T, PA-C  olmesartan (BENICAR) 20 MG tablet TAKE 1 TABLET BY MOUTH DAILY 08/29/22   Etta Grandchild, MD  pantoprazole (PROTONIX) 40 MG tablet TAKE 1 TABLET BY MOUTH 2 TIMES A DAY 08/15/22   Worthy Rancher B, FNP  rOPINIRole (REQUIP) 0.5 MG tablet TAKE 1 OR 2 TABLETS BY MOUTH DAILY AT NOON AND AT 5:00PM , THEN TAKE 3 TABLETS AT BEDTIME 09/21/22   Etta Grandchild, MD  rosuvastatin (CRESTOR) 10 MG tablet Take 1 tablet (10 mg total) by mouth daily. 06/24/22   Etta Grandchild, MD  Semaglutide,0.25 or 0.5MG /DOS, (OZEMPIC, 0.25 OR 0.5 MG/DOSE,) 2 MG/3ML SOPN Inject 0.25 mg into the skin once a week. 07/15/22   Etta Grandchild, MD  SYNTHROID 112 MCG tablet TAKE  1 TABLET DAILY WITH BREAKFAST FOR HYPOTHYROIDISM  (ANNUAL APPT DUE IN JUNE MUST SEE PROVIDER FOR FUTURE REFILLS) 07/21/22   Etta Grandchild, MD      Allergies    Hydralazine, Ciprofloxacin, Propofol, and Ritalin [methylphenidate hcl]    Review of Systems   Review of Systems  All other systems reviewed and are negative.   Physical Exam Updated Vital Signs BP 119/74   Pulse 97   Temp 98.2 F (36.8 C)   Resp (!) 22   SpO2 95%  Physical Exam Vitals and nursing note reviewed.  Constitutional:      General: She is not in acute distress.    Appearance: She is well-developed. She is not diaphoretic.  HENT:     Head: Normocephalic and atraumatic.  Cardiovascular:     Rate and Rhythm: Normal rate and regular rhythm.      Heart sounds: No murmur heard.    No friction rub. No gallop.  Pulmonary:     Effort: Pulmonary effort is normal. No respiratory distress.     Breath sounds: Normal breath sounds. No wheezing.  Abdominal:     General: Bowel sounds are normal. There is no distension.     Palpations: Abdomen is soft.     Tenderness: There is no abdominal tenderness.  Musculoskeletal:        General: Normal range of motion.     Cervical back: Normal range of motion and neck supple.  Skin:    General: Skin is warm and dry.  Neurological:     General: No focal deficit present.     Mental Status: She is alert and oriented to person, place, and time.     ED Results / Procedures / Treatments   Labs (all labs ordered are listed, but only abnormal results are displayed) Labs Reviewed  COMPREHENSIVE METABOLIC PANEL - Abnormal; Notable for the following components:      Result Value   Total Bilirubin 1.4 (*)    All other components within normal limits  CBC WITH DIFFERENTIAL/PLATELET - Abnormal; Notable for the following components:   Hemoglobin 15.5 (*)    All other components within normal limits  URINALYSIS, W/ REFLEX TO CULTURE (INFECTION SUSPECTED) - Abnormal; Notable for the following components:   APPearance HAZY (*)    Glucose, UA >1,000 (*)    Hgb urine dipstick SMALL (*)    Protein, ur TRACE (*)    Leukocytes,Ua MODERATE (*)    Bacteria, UA MANY (*)    All other components within normal limits  RESP PANEL BY RT-PCR (RSV, FLU A&B, COVID)  [id number]  LACTIC ACID, PLASMA  LACTIC ACID, PLASMA    EKG None  Radiology DG Chest Port 1 View  Result Date: 10/06/2022 CLINICAL DATA:  Fever, chills EXAM: PORTABLE CHEST 1 VIEW COMPARISON:  None Available. FINDINGS: The heart size and mediastinal contours are within normal limits. Both lungs are clear. The visualized skeletal structures are unremarkable. IMPRESSION: No active disease. Electronically Signed   By: Helyn Numbers M.D.   On: 10/06/2022  23:09    Procedures Procedures    Medications Ordered in ED Medications  cefTRIAXone (ROCEPHIN) injection 1 g (has no administration in time range)  acetaminophen (TYLENOL) tablet 650 mg (650 mg Oral Given 10/06/22 2124)    ED Course/ Medical Decision Making/ A&P  Patient presenting with complaints of fever as described in the HPI.  Patient arrives here with a temp of 102.3, but vital signs otherwise stable.  Physical examination  basically unremarkable with the exception of mild right-sided CVA tenderness.  Workup initiated including CBC and metabolic panel.  Patient without leukocytosis or electrolyte derangement.  Respiratory panel is negative for COVID, flu, and RSV.  Urinalysis is suggestive of urinary tract infection.  Chest x-ray is negative.  Patient presenting with fever as described in the HPI.  Her only symptoms are back pain, but does have a positive urinalysis suspicious for pyelonephritis.  Patient is nontoxic in appearance and has no leukocytosis.  Her vital signs are stable and I feel can safely be treated as an outpatient.  I will give a dose of IM Rocephin and discharged with Keflex.  To return as needed if symptoms worsen or change.  Final Clinical Impression(s) / ED Diagnoses Final diagnoses:  None    Rx / DC Orders ED Discharge Orders     None         Geoffery Lyons, MD 10/07/22 [phone]

## 2022-10-08 MED ORDER — PANTOPRAZOLE SODIUM 40 MG PO TBEC: 40.0000 mg | DELAYED_RELEASE_TABLET | Freq: Two times a day (BID) | ORAL | 2 refills | Status: DC

## 2022-11-05 ENCOUNTER — Telehealth: Payer: Self-pay

## 2022-11-05 NOTE — Telephone Encounter (Signed)
Transition Care Management Unsuccessful Follow-up Telephone Call  Date of discharge and from where:  10/07/2022 [facility]  Attempts:  1st Attempt  Reason for unsuccessful TCM follow-up call:  Left voice message  Sukhmani Fetherolf [facility]  [facility], [facility] Guide Direct Dial: [phone]  Website: [email or web address]

## 2022-11-06 ENCOUNTER — Telehealth: Payer: Self-pay

## 2022-11-06 NOTE — Telephone Encounter (Signed)
Transition Care Management Unsuccessful Follow-up Telephone Call  Date of discharge and from where:  10/07/2022 [facility]  Attempts:  2nd Attempt  Reason for unsuccessful TCM follow-up call:  Left voice message  Fumiye Lubben [facility]  [facility], [facility] Guide Direct Dial: [phone]  Website: [email or web address]

## 2022-11-17 ENCOUNTER — Encounter: Payer: Self-pay | Admitting: Physical Medicine & Rehabilitation

## 2022-11-17 ENCOUNTER — Encounter: Payer: Medicare Other | Attending: Physical Medicine & Rehabilitation | Admitting: Physical Medicine & Rehabilitation

## 2022-11-17 VITALS — BP 145/82 | HR 64 | Ht 59.0 in | Wt 174.8 lb

## 2022-11-17 DIAGNOSIS — M47816 Spondylosis without myelopathy or radiculopathy, lumbar region: Secondary | ICD-10-CM

## 2022-11-17 NOTE — Progress Notes (Signed)
Subjective:    Patient ID: [name], female    DOB: [birth year], 72 y.o.   MRN: [id number]  HPI CC:  Low back pain  72 year old female kindly referred by her primary care physician for the evaluation of low back pain as well as lower extremity discomfort.  The patient states that these 2 problems do not occur together necessarily.  The lower extremity discomfort is mainly at night or if she has had prolonged sitting.  She just feels like she needs to get up and move.  She denies any numbness or tingling in the lower extremities no weakness.  No new bowel or bladder dysfunction. She is fully functional with all ADLs and mobility.  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  She does not exercise on a regular basis.  She estimates walking about 4 hours/day.  This is for her day-to-day activities. Several year hx of insidious onset low back pain  Takes tylenol 1000mg  TID  No NSAIDs No PT   LE restless leg syndrome on 3.5mg  of ropinirole per day  MRI LUMBAR SPINE WITHOUT CONTRAST   TECHNIQUE: Multiplanar, multisequence MR imaging of the lumbar spine was performed. No intravenous contrast was administered.   COMPARISON:  Lumbar radiographs 09/07/2022.   FINDINGS: Segmentation:  Standard.   Alignment:  No substantial sagittal subluxation.   Vertebrae:  No fracture, evidence of discitis, or bone lesion.   Conus medullaris and cauda equina: Conus extends to the T12-L1 level. Conus and cauda equina appear normal.   Paraspinal and other soft tissues: Unremarkable.   Disc levels:   T12-L1: No significant disc protrusion, foraminal stenosis, or canal stenosis.   L1-L2: No significant disc protrusion, foraminal stenosis, or canal stenosis.   L2-L3: Broad disc bulge with small foraminal disc protrusions bilaterally. Resulting mild bilateral foraminal stenosis. No significant canal stenosis.   L3-L4: Mild disc bulge.  Bilateral facet arthropathy. No significant canal or foraminal stenosis.   L4-L5: Mild disc bulging.  No significant foraminal stenosis.   L5-S1: Moderate facet arthropathy.  No significant stenosis.   IMPRESSION: Lower lumbar facet arthropathy with mild bilateral foraminal stenosis at L2-L3. Otherwise, no significant canal or foraminal stenosis.     Electronically Signed   By: Feliberto Harts M.D.   On: 09/21/2022 11:10 Pain Inventory Average Pain 10 Pain Right Now 3 My pain is sharp  In the last 24 hours, has pain interfered with the following? General activity 0 Relation with others 0 Enjoyment of life 8 What TIME of day is your pain at its worst? daytime, evening, and night Sleep (in general) Poor  Pain is worse with: bending, sitting, inactivity, and some activites Pain improves with:  nothing Relief from Meds: 2  walk without assistance ability to climb steps?  yes do you drive?  yes  employed # of hrs/week 40 what is your job? 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  yes CT/MRI  Any changes since last visit?  no Primary care Sanda Linger MD    Family History  Problem Relation Age of Onset   COPD Sister    Coronary artery disease Sister        age 42 also copd   Cancer Mother        bladder   Arthritis Other    Cancer Other  breast   Diabetes Other    Hyperlipidemia Other    Hypertension Other    Stroke Other    Heart disease Other    Coronary artery disease Father        also had AAA   Aneurysm Father        Aortic and thoracic fatal   Social History   Socioeconomic History   Marital status: Married    Spouse name: Not on file   Number of children: Not on file   Years of education: Not on file   Highest education level: Not on file  Occupational History   Not on file  Tobacco Use   Smoking status: Never   Smokeless tobacco: Never  Vaping Use    Vaping status: Never Used  Substance and Sexual Activity   Alcohol use: Yes    Comment: seldom   Drug use: No   Sexual activity: Not on file  Other Topics Concern   Not on file  Social History Narrative   Married   Has grandchildren   Never smoked    G4P4   hh of 2  care of GC ages 22 - 55 month  5 days a week   Social Determinants of Health   Financial Resource Strain: Low Risk  (06/26/2021)   Overall Financial Resource Strain (CARDIA)    Difficulty of Paying Living Expenses: Not hard at all  Food Insecurity: No Food Insecurity (06/26/2021)   Hunger Vital Sign    Worried About Running Out of Food in the Last Year: Never true    Ran Out of Food in the Last Year: Never true  Transportation Needs: No Transportation Needs (06/26/2021)   PRAPARE - Administrator, Civil Service (Medical): No    Lack of Transportation (Non-Medical): No  Physical Activity: Inactive (06/26/2021)   Exercise Vital Sign    Days of Exercise per Week: 0 days    Minutes of Exercise per Session: 0 min  Stress: No Stress Concern Present (06/26/2021)   [employer] of Occupational Health - Occupational Stress Questionnaire    Feeling of Stress : Not at all  Social Connections: Socially Integrated (06/26/2021)   Social Connection and Isolation Panel [NHANES]    Frequency of Communication with Friends and Family: More than three times a week    Frequency of Social Gatherings with Friends and Family: More than three times a week    Attends Religious Services: More than 4 times per year    Active Member of Clubs or Organizations: Yes    Attends Engineer, structural: More than 4 times per year    Marital Status: Married   Past Surgical History:  Procedure Laterality Date   ABDOMINAL HYSTERECTOMY  1994   fibroid   APPENDECTOMY  1968   CYSTOSCOPY WITH STENT PLACEMENT Bilateral 03/24/2017   Procedure: BILATERAL URETERAL CATHETER  PLACEMENT;  Surgeon: Sebastian Ache, MD;  Location: [facility];   Service: Urology;  Laterality: Bilateral;   CYSTOSCOPY/RETROGRADE/URETEROSCOPY  03/24/2017   Procedure: CYSTOSCOPY/RETROGRADE/URETEROSCOPY;  Surgeon: Sebastian Ache, MD;  Location: [facility];  Service: Urology;;   LAPAROSCOPIC PARTIAL COLECTOMY Left 03/24/2017   Procedure: LAPAROSCOPIC ASSISTED LEFT  SIGMOID COLECTOMY;  Surgeon: Ovidio Kin, MD;  Location: [facility];  Service: General;  Laterality: Left;   orif left tivial plateau fracture     Dr. Leslee Home 2/08   plates and pins take out of left knee  06/2008   RIGHT/LEFT HEART CATH AND CORONARY ANGIOGRAPHY N/A 07/24/2021  Procedure: RIGHT/LEFT HEART CATH AND CORONARY ANGIOGRAPHY;  Surgeon: Lennette Bihari, MD;  Location: [facility];  Service: Cardiovascular;  Laterality: N/A;   SIGMOIDOSCOPY  03/24/2017   Procedure: SIGMOIDOSCOPY;  Surgeon: Ovidio Kin, MD;  Location: [facility];  Service: General;;   TONSILLECTOMY  1979   TOTAL THYROIDECTOMY  2005   for  large nodule 2006   Past Medical History:  Diagnosis Date   Acute pyelonephritis 05/02/2012   Asthma    Diverticulitis 03/24/2017   see report [facility] [place] [facility]   Dyspnea    nl PFTs, and echo   GERD (gastroesophageal reflux disease)    Headache(784.0)    Heart murmur    History of echocardiogram    Echocardiogram 7/23: EF 65-70, no RWMA, Gr 1 DD, normal RVSF, inadequate TR signal to est PASP, trivial MR, AV sclerosis w/o AS   History of kidney stones    IBS (irritable bowel syndrome)    Nephrolithiasis    Pre-diabetes 12/2013   Recurrent oral ulcers    Thyroid goiter    I 131 rx and then  total throidectomy 2005 [facility]   BP (!) 173/89   Pulse 64   Ht 4\' 11"  (1.499 m)   Wt 174 lb 12.8 oz (79.3 kg)   SpO2 96%   BMI 35.31 kg/m   Opioid Risk Score:   Fall Risk Score:  `1  Depression screen [facility] 2/9     11/17/2022    9:45 AM 07/20/2022   10:59 AM 06/15/2022   10:19 AM 07/21/2021   10:42 AM 06/26/2021    8:32 AM 01/21/2021    8:34 AM 06/25/2020    9:59 AM   Depression screen PHQ 2/9  Decreased Interest 0 0 0 0 0 0 0  Down, Depressed, Hopeless 0 0 0 0 0 0 0  PHQ - 2 Score 0 0 0 0 0 0 0  Altered sleeping 2   1  0   Tired, decreased energy 1   1  0   Change in appetite 0   0  0   Feeling bad or failure about yourself  0   0  0   Trouble concentrating 0   0  0   Moving slowly or fidgety/restless 0   0  0   Suicidal thoughts 0   0  0   PHQ-9 Score 3   2  0   Difficult doing work/chores Somewhat difficult   Not difficult at all       Review of Systems  Constitutional: Negative.   HENT: Negative.    Eyes: Negative.   Respiratory:  Positive for apnea and shortness of breath.   Cardiovascular:  Positive for leg swelling.  Gastrointestinal: Negative.   Endocrine:       High blood sugars  Genitourinary:        Bladder control  Musculoskeletal:  Positive for back pain and myalgias.       Restless leg  Skin: Negative.   Allergic/Immunologic: Negative.   Neurological:        Tingling and restless legs  Hematological: Negative.   Psychiatric/Behavioral: Negative.    All other systems reviewed and are negative.      Objective:   Physical Exam  General No acute distress Mood and affect appropriate Extremities without edema Motor strength is 5/5 bilateral deltoid, bicep, tricep, grip, hip flexor, knee extensor ankle dorsiflexion plantarflexion MSK cervical spine range of motion is full without pain.  Shoulder elbow  hand and wrist range of motion normal without pain.  No joint deformities. Lower extremity range of motion is normal at the hips and knees.  There is healed surgical incisions from TKR's bilaterally no tenderness to palpation no skin discoloration or lesions. No foot or ankle deformities or pain with range of motion. Lumbar range of motion has full forward flexion extension is limited to 75% of normal accompanied by pain. The pain is mainly at the lumbosacral junction Neurologic sensation intact to light touch and as well as  pinprick bilateral L2-L3-L4 L5-S1 dermatomal distribution Negative straight leg raise bilaterally Muscle testing as above Sacroiliac provocative testing Distraction test positive on the right side FABERs causes pain in the low back but not in the SI area bilaterally. No tenderness over the SI area there is tenderness mild over the lumbar paraspinal area.       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  Will send to physical therapy and if patient fails to note any improvements would consider lumbar medial branch blocks 2.  RLS, this is likely the cause of her lower extremity discomfort.  She does not have typical neurologic signs of numbness tingling or strength deficits deep tendon reflexes are depressed at the knees as is usual after TKR and somewhat at the ankles which is not unusual for her age. She may benefit from neurologic evaluation for additional treatment options for restless leg syndrome.

## 2022-11-17 NOTE — Patient Instructions (Signed)
Back Exercises These exercises help to make your trunk and back strong. They also help to keep the lower back flexible. Doing these exercises can help to prevent or lessen pain in your lower back. If you have back pain, try to do these exercises 2-3 times each day or as told by your doctor. As you get better, do the exercises once each day. Repeat the exercises more often as told by your doctor. To stop back pain from coming back, do the exercises once each day, or as told by your doctor. Do exercises exactly as told by your doctor. Stop right away if you feel sudden pain or your pain gets worse. Exercises Single knee to chest Do these steps 3-5 times in a row for each leg: Lie on your back on a firm bed or the floor with your legs stretched out. Bring one knee to your chest. Grab your knee or thigh with both hands and hold it in place. Pull on your knee until you feel a gentle stretch in your lower back or butt. Keep doing the stretch for 10-30 seconds. Slowly let go of your leg and straighten it. Pelvic tilt Do these steps 5-10 times in a row: Lie on your back on a firm bed or the floor with your legs stretched out. Bend your knees so they point up to the ceiling. Your feet should be flat on the floor. Tighten your lower belly (abdomen) muscles to press your lower back against the floor. This will make your tailbone point up to the ceiling instead of pointing down to your feet or the floor. Stay in this position for 5-10 seconds while you gently tighten your muscles and breathe evenly. Cat-cow Do these steps until your lower back bends more easily: Get on your hands and knees on a firm bed or the floor. Keep your hands under your shoulders, and keep your knees under your hips. You may put padding under your knees. Let your head hang down toward your chest. Tighten (contract) the muscles in your belly. Point your tailbone toward the floor so your lower back becomes rounded like the back of a  cat. Stay in this position for 5 seconds. Slowly lift your head. Let the muscles of your belly relax. Point your tailbone up toward the ceiling so your back forms a sagging arch like the back of a cow. Stay in this position for 5 seconds.  Press-ups Do these steps 5-10 times in a row: Lie on your belly (face-down) on a firm bed or the floor. Place your hands near your head, about shoulder-width apart. While you keep your back relaxed and keep your hips on the floor, slowly straighten your arms to raise the top half of your body and lift your shoulders. Do not use your back muscles. You may change where you place your hands to make yourself more comfortable. Stay in this position for 5 seconds. Keep your back relaxed. Slowly return to lying flat on the floor.  Bridges Do these steps 10 times in a row: Lie on your back on a firm bed or the floor. Bend your knees so they point up to the ceiling. Your feet should be flat on the floor. Your arms should be flat at your sides, next to your body. Tighten your butt muscles and lift your butt off the floor until your waist is almost as high as your knees. If you do not feel the muscles working in your butt and the back of   your thighs, slide your feet 1-2 inches (2.5-5 cm) farther away from your butt. Stay in this position for 3-5 seconds. Slowly lower your butt to the floor, and let your butt muscles relax. If this exercise is too easy, try doing it with your arms crossed over your chest. Belly crunches Do these steps 5-10 times in a row: Lie on your back on a firm bed or the floor with your legs stretched out. Bend your knees so they point up to the ceiling. Your feet should be flat on the floor. Cross your arms over your chest. Tip your chin a little bit toward your chest, but do not bend your neck. Tighten your belly muscles and slowly raise your chest just enough to lift your shoulder blades a tiny bit off the floor. Avoid raising your body  higher than that because it can put too much stress on your lower back. Slowly lower your chest and your head to the floor. Back lifts Do these steps 5-10 times in a row: Lie on your belly (face-down) with your arms at your sides, and rest your forehead on the floor. Tighten the muscles in your legs and your butt. Slowly lift your chest off the floor while you keep your hips on the floor. Keep the back of your head in line with the curve in your back. Look at the floor while you do this. Stay in this position for 3-5 seconds. Slowly lower your chest and your face to the floor. Contact a doctor if: Your back pain gets a lot worse when you do an exercise. Your back pain does not get better within 2 hours after you exercise. If you have any of these problems, stop doing the exercises. Do not do them again unless your doctor says it is okay. Get help right away if: You have sudden, very bad back pain. If this happens, stop doing the exercises. Do not do them again unless your doctor says it is okay. This information is not intended to replace advice given to you by your health care provider. Make sure you discuss any questions you have with your health care provider. Document Revised: 03/27/2020 Document Reviewed: 03/27/2020 Elsevier Patient Education  2024 [employer]  

## 2022-11-18 DIAGNOSIS — N3000 Acute cystitis without hematuria: Secondary | ICD-10-CM | POA: Diagnosis not present

## 2022-11-18 DIAGNOSIS — N952 Postmenopausal atrophic vaginitis: Secondary | ICD-10-CM | POA: Diagnosis not present

## 2022-11-18 DIAGNOSIS — N3946 Mixed incontinence: Secondary | ICD-10-CM | POA: Diagnosis not present

## 2022-11-24 ENCOUNTER — Other Ambulatory Visit: Payer: Self-pay | Admitting: Internal Medicine

## 2022-11-24 DIAGNOSIS — I10 Essential (primary) hypertension: Secondary | ICD-10-CM

## 2022-11-26 ENCOUNTER — Other Ambulatory Visit: Payer: Self-pay | Admitting: Internal Medicine

## 2022-11-26 DIAGNOSIS — G2581 Restless legs syndrome: Secondary | ICD-10-CM

## 2022-12-21 ENCOUNTER — Ambulatory Visit: Payer: Medicare Other

## 2022-12-21 VITALS — BP 119/70 | HR 81 | Ht 59.5 in | Wt 174.8 lb

## 2022-12-21 DIAGNOSIS — Z0000 Encounter for general adult medical examination without abnormal findings: Secondary | ICD-10-CM

## 2022-12-21 DIAGNOSIS — Z780 Asymptomatic menopausal state: Secondary | ICD-10-CM

## 2022-12-21 NOTE — Patient Instructions (Signed)
[name] , Thank you for taking time to come for your Medicare Wellness Visit. I appreciate your ongoing commitment to your health goals. Please review the following plan we discussed and let me know if I can assist you in the future.   Referrals/Orders/Follow-Ups/Clinician Recommendations: Each day, aim for 6 glasses of water, plenty of protein in your diet and try to get up and walk/ stretch every hour for 5-10 minutes at a time.  It was nice to meet you today.  Remember you have an order for:   [x]   Bone Density     Please call for appointment:  [facility] [address] [place], [place] [address] [phone]   Make sure to wear two-piece clothing.  No lotions, powders, or deodorants the day of the appointment. Make sure to bring picture ID and insurance card.  Bring list of medications you are currently taking including any supplements.   Schedule your [place] screening mammogram through MyChart!   Log into your MyChart account.  Go to 'Visit' (or 'Appointments' if on mobile App) --> Schedule an Appointment  Under 'Select a Reason for Visit' choose the Mammogram Screening option.  Complete the pre-visit questions and select the time and place that best fits your schedule.    This is a list of the screening recommended for you and due dates:  Health Maintenance  Topic Date Due   Eye exam for diabetics  11/27/2022   Zoster (Shingles) Vaccine (1 of 2) 03/23/2023*   Flu Shot  04/26/2023*   Hemoglobin A1C  12/25/2022   Complete foot exam   01/07/2023   DTaP/Tdap/Td vaccine (3 - Td or Tdap) 01/25/2023   Mammogram  04/08/2023   Yearly kidney health urinalysis for diabetes  06/24/2023   Yearly kidney function blood test for diabetes  10/06/2023   Medicare Annual Wellness Visit  12/21/2023   Colon Cancer Screening  07/09/2031   Pneumonia Vaccine  Completed   DEXA scan (bone density measurement)  Completed   Hepatitis C Screening  Completed    HPV Vaccine  Aged Out   COVID-19 Vaccine  Discontinued  *Topic was postponed. The date shown is not the original due date.    Advanced directives: (Copy Requested) Please bring a copy of your health care power of attorney and living will to the office to be added to your chart at your convenience.  Next Medicare Annual Wellness Visit scheduled for next year: Yes

## 2022-12-21 NOTE — Progress Notes (Addendum)
Subjective:   [name] is a 72 y.o. female who presents for Medicare Annual (Subsequent) preventive examination.  Visit Complete: In person  Cardiac Risk Factors include: hypertension;advanced age (>37men, >48 women);diabetes mellitus;dyslipidemia;obesity (BMI >30kg/m2);Other (see comment), Risk factor comments: PAD, OSA, Osteopenia     Objective:    Today's Vitals   12/21/22 0819 12/21/22 0825  BP:  119/70  Pulse:  81  SpO2:  94%  Weight:  174 lb 12.8 oz (79.3 kg)  Height:  4' 11.5" (1.511 m)  PainSc: 6     Body mass index is 34.71 kg/m.     12/21/2022    8:34 AM 10/06/2022    9:19 PM 09/09/2022   10:23 AM 06/16/2022    7:33 PM 09/12/2021    9:35 PM 07/24/2021    9:20 AM 06/27/2021    9:51 AM  Advanced Directives  Does Patient Have a Medical Advance Directive? Yes No No No Yes Yes Yes  Type of Estate agent of [place];Living will    Healthcare Power of [place];Living will Living will;Healthcare Power of [employer] Power of [place];Living will  Does patient want to make changes to medical advance directive?     No - Patient declined  No - Patient declined  Copy of Healthcare Power of Attorney in Chart? No - copy requested    Yes - validated most recent copy scanned in chart (See row information)    Would patient like information on creating a medical advance directive?  No - Patient declined  No - Patient declined       Current Medications (verified) Outpatient Encounter Medications as of 12/21/2022  Medication Sig   acetaminophen (TYLENOL) 500 MG tablet Take 1,000 mg by mouth 3 (three) times daily.   aspirin EC 81 MG tablet Take 1 tablet (81 mg total) by mouth daily. Swallow whole.   Blood Glucose Monitoring Suppl (FREESTYLE LITE) w/Device KIT USE TWO TIMES A DAY TO TEST GLUCOSE CONTROL   Cholecalciferol (VITAMIN D3) 50 MCG (2000 UT) capsule Take 1 capsule (2,000 Units total) by mouth daily.   dapagliflozin propanediol (FARXIGA) 10  MG TABS tablet Take 1 tablet (10 mg total) by mouth daily before breakfast.   FREESTYLE LITE test strip USE 1 STRIP TO TEST TWICE A DAY   GEMTESA 75 MG TABS Take 75 mg by mouth daily.   Insulin Pen Needle 32G X 6 MM MISC 1 Act by Does not apply route once a week.   olmesartan (BENICAR) 20 MG tablet TAKE 1 TABLET BY MOUTH DAILY   pantoprazole (PROTONIX) 40 MG tablet Take 1 tablet (40 mg total) by mouth 2 (two) times daily.   rOPINIRole (REQUIP) 0.5 MG tablet TAKE 1 OR 2 TABLETS BY MOUTH DAILY AT NOON AND AT 5:00PM , THEN TAKE 3 TABLETS AT BEDTIME   [DISCONTINUED] SYNTHROID 112 MCG tablet TAKE 1 TABLET DAILY WITH BREAKFAST FOR HYPOTHYROIDISM  (ANNUAL APPT DUE IN JUNE MUST SEE PROVIDER FOR FUTURE REFILLS)   diphenhydrAMINE-zinc acetate (BENADRYL EXTRA STRENGTH) cream Apply 1 Application topically 3 (three) times daily as needed for itching. (Patient not taking: Reported on 12/21/2022)   metFORMIN (GLUCOPHAGE-XR) 750 MG 24 hr tablet Take 1 tablet (750 mg total) by mouth daily with breakfast. (Patient not taking: Reported on 11/17/2022)   nitroGLYCERIN (NITROSTAT) 0.4 MG SL tablet Place 1 tablet (0.4 mg total) under the tongue every 5 (five) minutes as needed for chest pain.   rosuvastatin (CRESTOR) 10 MG tablet Take 1 tablet (10  mg total) by mouth daily. (Patient not taking: Reported on 11/17/2022)   No facility-administered encounter medications on file as of 12/21/2022.    Allergies (verified) Hydralazine, Ciprofloxacin, Propofol, and Ritalin [methylphenidate hcl]   History: Past Medical History:  Diagnosis Date   Acute pyelonephritis 05/02/2012   Asthma    Diverticulitis 03/24/2017   see report [place] [facility]   Dyspnea    nl PFTs, and echo   GERD (gastroesophageal reflux disease)    Headache(784.0)    Heart murmur    History of echocardiogram    Echocardiogram 7/23: EF 65-70, no RWMA, Gr 1 DD, normal RVSF, inadequate TR signal to est PASP, trivial MR, AV sclerosis w/o AS    History of kidney stones    IBS (irritable bowel syndrome)    Nephrolithiasis    Pre-diabetes 12/2013   Recurrent oral ulcers    Thyroid goiter    I 131 rx and then  total throidectomy 2005 [facility]   Past Surgical History:  Procedure Laterality Date   ABDOMINAL HYSTERECTOMY  1994   fibroid   APPENDECTOMY  1968   CYSTOSCOPY WITH STENT PLACEMENT Bilateral 03/24/2017   Procedure: BILATERAL URETERAL CATHETER  PLACEMENT;  Surgeon: Sebastian Ache, MD;  Location: [facility];  Service: Urology;  Laterality: Bilateral;   CYSTOSCOPY/RETROGRADE/URETEROSCOPY  03/24/2017   Procedure: CYSTOSCOPY/RETROGRADE/URETEROSCOPY;  Surgeon: Sebastian Ache, MD;  Location: [facility];  Service: Urology;;   LAPAROSCOPIC PARTIAL COLECTOMY Left 03/24/2017   Procedure: LAPAROSCOPIC ASSISTED LEFT  SIGMOID COLECTOMY;  Surgeon: Ovidio Kin, MD;  Location: [facility];  Service: General;  Laterality: Left;   orif left tivial plateau fracture     Dr. Leslee Home 2/08   plates and pins take out of left knee  06/2008   RIGHT/LEFT HEART CATH AND CORONARY ANGIOGRAPHY N/A 07/24/2021   Procedure: RIGHT/LEFT HEART CATH AND CORONARY ANGIOGRAPHY;  Surgeon: Lennette Bihari, MD;  Location: [facility];  Service: Cardiovascular;  Laterality: N/A;   SIGMOIDOSCOPY  03/24/2017   Procedure: SIGMOIDOSCOPY;  Surgeon: Ovidio Kin, MD;  Location: [facility];  Service: General;;   TONSILLECTOMY  1979   TOTAL THYROIDECTOMY  2005   for  large nodule 2006   Family History  Problem Relation Age of Onset   COPD Sister    Coronary artery disease Sister        age 28 also copd   Cancer Mother        bladder   Arthritis Other    Cancer Other        breast   Diabetes Other    Hyperlipidemia Other    Hypertension Other    Stroke Other    Heart disease Other    Coronary artery disease Father        also had AAA   Aneurysm Father        Aortic and thoracic fatal   Social History   Socioeconomic History   Marital status: Married    Spouse  name: Fredrik Cove   Number of children: 4   Years of education: Not on file   Highest education level: Not on file  Occupational History   Occupation: private duty/part time  Tobacco Use   Smoking status: Never   Smokeless tobacco: Never  Vaping Use   Vaping status: Never Used  Substance and Sexual Activity   Alcohol use: Yes    Comment: seldom   Drug use: No   Sexual activity: Not on file  Other Topics Concern   Not on  file  Social History Narrative   Married   Has grandchildren   Never smoked    G4P4   hh of 2  care of GC ages 85 - 6 month  5 days a week   Social Determinants of Health   Financial Resource Strain: High Risk (12/21/2022)   Overall Financial Resource Strain (CARDIA)    Difficulty of Paying Living Expenses: Very hard  Food Insecurity: No Food Insecurity (12/21/2022)   Hunger Vital Sign    Worried About Running Out of Food in the Last Year: Never true    Ran Out of Food in the Last Year: Never true  Transportation Needs: No Transportation Needs (12/21/2022)   PRAPARE - Administrator, Civil Service (Medical): No    Lack of Transportation (Non-Medical): No  Physical Activity: Inactive (12/21/2022)   Exercise Vital Sign    Days of Exercise per Week: 0 days    Minutes of Exercise per Session: 0 min  Stress: No Stress Concern Present (12/21/2022)   [employer] of Occupational Health - Occupational Stress Questionnaire    Feeling of Stress : Not at all  Social Connections: Moderately Integrated (12/21/2022)   Social Connection and Isolation Panel [NHANES]    Frequency of Communication with Friends and Family: More than three times a week    Frequency of Social Gatherings with Friends and Family: Three times a week    Attends Religious Services: More than 4 times per year    Active Member of Clubs or Organizations: No    Attends Banker Meetings: Never    Marital Status: Married    Tobacco Counseling Counseling given: Not  Answered   Clinical Intake:  Pre-visit preparation completed: Yes  Pain : 0-10 Pain Score: 6  Pain Location: Back (back of legs) Pain Onset: More than a month ago Pain Frequency: Constant Pain Relieving Factors: requip, Tylenol  Pain Relieving Factors: requip, Tylenol  BMI - recorded: 34.71 Nutritional Status: BMI > 30  Obese Nutritional Risks: None Diabetes: Yes Did pt. bring in CBG monitor from home?: No  How often do you need to have someone help you when you read instructions, pamphlets, or other written materials from your doctor or pharmacy?: 1 - Never  Interpreter Needed?: No  Information entered by :: Sophiamarie Nease, RMA   Activities of Daily Living    12/21/2022    8:19 AM  In your present state of health, do you have any difficulty performing the following activities:  Hearing? 0  Vision? 0  Difficulty concentrating or making decisions? 0  Walking or climbing stairs? 0  Dressing or bathing? 0  Doing errands, shopping? 0  Preparing Food and eating ? N  Using the Toilet? N  In the past six months, have you accidently leaked urine? Y  Do you have problems with loss of bowel control? N  Managing your Medications? N  Managing your Finances? N  Housekeeping or managing your Housekeeping? N    Patient Care Team: Etta Grandchild, MD as PCP - General (Internal Medicine) Wendall Stade, MD as PCP - Cardiology (Cardiology) Tillman Sers, MD as Referring Physician (Orthopedic Surgery) Su Grand, MD (Inactive) as Attending Physician (Urology) Wendall Stade, MD as Consulting Physician (Cardiology) Sharrell Ku, MD as Consulting Physician (Gastroenterology) Case, [place], MD as Referring Physician (Orthopedic Surgery) Mateo Flow, MD as Consulting Physician (Ophthalmology)  Indicate any recent Medical Services you may have received from other than [facility] providers in the past year (  date may be approximate).     Assessment:   This is a routine wellness  examination for [place].  Hearing/Vision screen Hearing Screening - Comments:: Denies hearing difficulties   Vision Screening - Comments:: Wears eyeglasses   Goals Addressed               This Visit's Progress     Patient Stated (pt-stated)   On track     I want to just stay alive      Depression Screen    12/21/2022    8:38 AM 11/17/2022    9:45 AM 07/20/2022   10:59 AM 06/15/2022   10:19 AM 07/21/2021   10:42 AM 06/26/2021    8:32 AM 01/21/2021    8:34 AM  PHQ 2/9 Scores  PHQ - 2 Score 0 0 0 0 0 0 0  PHQ- 9 Score 0 3   2  0    Fall Risk    12/21/2022    8:35 AM 11/17/2022    9:45 AM 06/15/2022   10:19 AM 07/21/2021   10:42 AM 06/26/2021    8:35 AM  Fall Risk   Falls in the past year? 0 0 0 1 1  Number falls in past yr: 0 0 0 0 0  Injury with Fall? 0  0 1 1  Comment     Twisted knee. [facility] orthopedic  Risk for fall due to : No Fall Risks  No Fall Risks    Follow up Falls prevention discussed;Falls evaluation completed  Falls evaluation completed      MEDICARE RISK AT HOME: Medicare Risk at Home Any stairs in or around the home?: No Home free of loose throw rugs in walkways, pet beds, electrical cords, etc?: Yes Adequate lighting in your home to reduce risk of falls?: Yes Life alert?: Yes (wears Apple watch) Use of a cane, walker or w/c?: No Grab bars in the bathroom?: Yes Shower chair or bench in shower?: Yes Elevated toilet seat or a handicapped toilet?: Yes  TIMED UP AND GO:  Was the test performed?  Yes  Length of time to ambulate 10 feet: 15 sec Gait steady and fast without use of assistive device    Cognitive Function:        12/21/2022    8:20 AM 06/26/2021    8:39 AM  6CIT Screen  What Year? 0 points 0 points  What month? 0 points 0 points  What time? 0 points 0 points  Count back from 20 0 points 0 points  Months in reverse 0 points 0 points  Repeat phrase 0 points 0 points  Total Score 0 points 0 points     Immunizations Immunization History  Administered Date(s) Administered   Fluad Quad(high Dose 65+) 11/09/2020   Pneumococcal Conjugate-13 01/28/2016   Pneumococcal Polysaccharide-23 03/09/2017   Td 11/26/2001   Tdap 01/24/2013   Zoster, Live 01/24/2013    TDAP status: Up to date  Flu Vaccine status: Declined, Education has been provided regarding the importance of this vaccine but patient still declined. Advised may receive this vaccine at local pharmacy or Health Dept. Aware to provide a copy of the vaccination record if obtained from local pharmacy or Health Dept. Verbalized acceptance and understanding.  Pneumococcal vaccine status: Up to date  Covid-19 vaccine status: Declined, Education has been provided regarding the importance of this vaccine but patient still declined. Advised may receive this vaccine at local pharmacy or Health Dept.or vaccine clinic. Aware to  provide a copy of the vaccination record if obtained from local pharmacy or Health Dept. Verbalized acceptance and understanding.  Qualifies for Shingles Vaccine? Yes   Zostavax completed Yes   Shingrix Completed?: No.    Education has been provided regarding the importance of this vaccine. Patient has been advised to call insurance company to determine out of pocket expense if they have not yet received this vaccine. Advised may also receive vaccine at local pharmacy or Health Dept. Verbalized acceptance and understanding.  Screening Tests Health Maintenance  Topic Date Due   HEMOGLOBIN A1C  12/25/2022   OPHTHALMOLOGY EXAM  01/26/2023 (Originally 11/27/2022)   Zoster Vaccines- Shingrix (1 of 2) 03/23/2023 (Originally 07/19/1969)   INFLUENZA VACCINE  04/26/2023 (Originally 08/27/2022)   FOOT EXAM  01/07/2023   DTaP/Tdap/Td (3 - Td or Tdap) 01/25/2023   MAMMOGRAM  04/08/2023   Diabetic kidney evaluation - Urine ACR  06/24/2023   Diabetic kidney evaluation - eGFR measurement  10/06/2023   Medicare Annual Wellness  (AWV)  12/21/2023   Colonoscopy  07/09/2031   Pneumonia Vaccine 73+ Years old  Completed   DEXA SCAN  Completed   Hepatitis C Screening  Completed   HPV VACCINES  Aged Out   COVID-19 Vaccine  Discontinued    Health Maintenance  Health Maintenance Due  Topic Date Due   HEMOGLOBIN A1C  12/25/2022     Colorectal cancer screening: Type of screening: Colonoscopy. Completed 07/08/2021. Repeat every 1 years  Mammogram status: Completed 04/08/2022. Repeat every year  Bone Density status: Ordered 12/21/2022. Pt provided with contact info and advised to call to schedule appt.  Lung Cancer Screening: (Low Dose CT Chest recommended if Age 71-80 years, 20 pack-year currently smoking OR have quit w/in 15years.) does not qualify.   Lung Cancer Screening Referral: N/A  Additional Screening:  Hepatitis C Screening: does qualify; Completed 03/01/2015  Vision Screening: Recommended annual ophthalmology exams for early detection of glaucoma and other disorders of the eye. Is the patient up to date with their annual eye exam?  Yes  Who is the provider or what is the name of the office in which the patient attends annual eye exams? [facility] If pt is not established with a provider, would they like to be referred to a provider to establish care? No .   Dental Screening: Recommended annual dental exams for proper oral hygiene  Diabetic Foot Exam: Diabetic Foot Exam: Completed 01/06/2022  Community Resource Referral / Chronic Care Management: CRR required this visit?  No   CCM required this visit?  No     Plan:     I have personally reviewed and noted the following in the patient's chart:   Medical and social history Use of alcohol, tobacco or illicit drugs  Current medications and supplements including opioid prescriptions. Patient is not currently taking opioid prescriptions. Functional ability and status Nutritional status Physical activity Advanced directives List of  other physicians Hospitalizations, surgeries, and ER visits in previous 12 months Vitals Screenings to include cognitive, depression, and falls Referrals and appointments  In addition, I have reviewed and discussed with patient certain preventive protocols, quality metrics, and best practice recommendations. A written personalized care plan for preventive services as well as general preventive health recommendations were provided to patient.     Rickayla Wieland L Autymn Omlor, CMA   12/29/2022   After Visit Summary: (MyChart) Due to this being a telephonic visit, the after visit summary with patients personalized plan was offered to patient via  MyChart   Nurse Notes: Patient declines the Flu vaccine and the Covid vaccine as well as any other Shingrix vaccines.  She is due for a DEXA and order has been placed today.  Patient is due for a follow up visit with Dr. Yetta Barre and will make her appointment today.  I have sent a request for records to her eye doctor's office today.  She had no other concerns to address today.

## 2022-12-22 ENCOUNTER — Ambulatory Visit: Payer: Medicare Other | Admitting: Physical Therapy

## 2022-12-22 ENCOUNTER — Encounter: Payer: Self-pay | Admitting: Physical Therapy

## 2022-12-22 ENCOUNTER — Other Ambulatory Visit: Payer: Self-pay

## 2022-12-22 DIAGNOSIS — M79604 Pain in right leg: Secondary | ICD-10-CM

## 2022-12-22 DIAGNOSIS — M6281 Muscle weakness (generalized): Secondary | ICD-10-CM | POA: Diagnosis not present

## 2022-12-22 DIAGNOSIS — M79605 Pain in left leg: Secondary | ICD-10-CM

## 2022-12-22 DIAGNOSIS — M5459 Other low back pain: Secondary | ICD-10-CM | POA: Diagnosis not present

## 2022-12-22 DIAGNOSIS — M2569 Stiffness of other specified joint, not elsewhere classified: Secondary | ICD-10-CM

## 2022-12-22 NOTE — Therapy (Cosign Needed)
OUTPATIENT PHYSICAL THERAPY LOWER EXTREMITY EVALUATION   Patient Name: [name] MRN: [id number] DOB:06/10/1950, 72 y.o., female Today's Date: 12/23/2022  END OF SESSION:  PT End of Session - 12/22/22 0848     Visit Number 1    Number of Visits 16    Date for PT Re-Evaluation 02/16/23    Authorization Type Medicare    Authorization - Visit Number 10    PT Start Time 0849    PT Stop Time 0930    PT Time Calculation (min) 41 min    Behavior During Therapy [facility] for tasks assessed/performed             Past Medical History:  Diagnosis Date   Acute pyelonephritis 05/02/2012   Asthma    Diverticulitis 03/24/2017   see report [place] [facility]   Dyspnea    nl PFTs, and echo   GERD (gastroesophageal reflux disease)    Headache(784.0)    Heart murmur    History of echocardiogram    Echocardiogram 7/23: EF 65-70, no RWMA, Gr 1 DD, normal RVSF, inadequate TR signal to est PASP, trivial MR, AV sclerosis w/o AS   History of kidney stones    IBS (irritable bowel syndrome)    Nephrolithiasis    Pre-diabetes 12/2013   Recurrent oral ulcers    Thyroid goiter    I 131 rx and then  total throidectomy 2005 [facility]   Past Surgical History:  Procedure Laterality Date   ABDOMINAL HYSTERECTOMY  1994   fibroid   APPENDECTOMY  1968   CYSTOSCOPY WITH STENT PLACEMENT Bilateral 03/24/2017   Procedure: BILATERAL URETERAL CATHETER  PLACEMENT;  Surgeon: Sebastian Ache, MD;  Location: [facility];  Service: Urology;  Laterality: Bilateral;   CYSTOSCOPY/RETROGRADE/URETEROSCOPY  03/24/2017   Procedure: CYSTOSCOPY/RETROGRADE/URETEROSCOPY;  Surgeon: Sebastian Ache, MD;  Location: [facility];  Service: Urology;;   LAPAROSCOPIC PARTIAL COLECTOMY Left 03/24/2017   Procedure: LAPAROSCOPIC ASSISTED LEFT  SIGMOID COLECTOMY;  Surgeon: Ovidio Kin, MD;  Location: [facility];  Service: General;  Laterality: Left;   orif left tivial plateau fracture     Dr. Leslee Home 2/08   plates and pins take out  of left knee  06/2008   RIGHT/LEFT HEART CATH AND CORONARY ANGIOGRAPHY N/A 07/24/2021   Procedure: RIGHT/LEFT HEART CATH AND CORONARY ANGIOGRAPHY;  Surgeon: Lennette Bihari, MD;  Location: [facility];  Service: Cardiovascular;  Laterality: N/A;   SIGMOIDOSCOPY  03/24/2017   Procedure: SIGMOIDOSCOPY;  Surgeon: Ovidio Kin, MD;  Location: [facility];  Service: General;;   TONSILLECTOMY  1979   TOTAL THYROIDECTOMY  2005   for  large nodule 2006   Patient Active Problem List   Diagnosis Date Noted   Foraminal stenosis of lumbar region 09/29/2022   Lumbar radiculitis 09/07/2022   PAD (peripheral artery disease) (HCC) 01/07/2022   Dyslipidemia, goal LDL below 70 01/06/2022   Stage 3a chronic kidney disease (HCC) 01/06/2022   Obstructive sleep apnea 09/08/2021   Primary hypertension 06/30/2021   Type II diabetes mellitus with manifestations (HCC) 06/30/2021   Osteopenia 04/25/2010   CATARACT, LEFT EYE 11/27/2009   Obesity (BMI 30-39.9) 01/12/2008   VITAMIN D DEFICIENCY 11/25/2006   Hypothyroidism 10/04/2006   RLS (restless legs syndrome) 10/04/2006   GERD 09/30/2006    PCP: Etta Grandchild, MD  REFERRING PROVIDER: Erick Colace, MD  REFERRING DIAG: Spondylosis without myelopathy or radiculopathy, lumbar region   THERAPY DIAG:  Pain in right leg  Other low back pain  Pain in  left leg  Muscle weakness (generalized)  Rationale for Evaluation and Treatment: Rehabilitation  ONSET DATE: Pt is unsure  SUBJECTIVE:   SUBJECTIVE STATEMENT: Pt states the pain in her lower back and in her legs began insidiously. She is unable to state when her pain began but has had back pain for over 5 years. Pt reports having 8 surgeries on L LE. 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 She has difficulty with stairs, getting down onto the floor, or on her knees, and sitting for long periods  of time  PERTINENT HISTORY: Primary HTN, PAD, Hypothyroidism, DM Type 2, Osteopenia, Stage 3a kidney disease  PAIN:  Are you having pain? Yes: NPRS scale: up to 10 /10 Pain location: Left side Lumbar Pain description: dull, constant, ache,  Aggravating factors: Sitting (time depends), pulling, bending, twist  Relieving factors: using the jets in bathroom, RLS medicine, Tylenol x3 day,   Yes: NPRS scale: 8 rest /10 Pain location: Bil legs Pain description: tightness, and constant Aggravating factors: sitting (time depends), pulling, bending, twist  Relieving factors: using the jets in bathroom, RLS medicine, Tylenol x3 day, PRECAUTIONS: None  RED FLAGS: None   WEIGHT BEARING RESTRICTIONS: No  FALLS:  Has patient fallen in last 6 months? 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  DIAGNOSTIC FINDINGS: N/a  PATIENT SURVEYS:  FOTO 1 Eval (12/22/22)  COGNITION: Overall cognitive status: Within functional limits for tasks assessed     SENSATION:   MUSCLE LENGTH: Hamstrings: Right Min restriction ; Left Min restriction w/ p!  Piriformis test: Right WFL deg; Left Min restriction with p!    POSTURE: decreased lumbar lordosis  PALPATION: TTP midline and TP of L2-L5; L glute   LOWER EXTREMITY ROM:  Active ROM Right eval Left eval  Hip flexion    Hip extension    Hip abduction    Hip adduction    Hip internal rotation    Hip external rotation    Knee flexion    Knee extension    Ankle dorsiflexion    Ankle plantarflexion    Ankle inversion    Ankle eversion     (Blank rows = not tested)  LOWER EXTREMITY MMT:  MMT Right eval Left eval  Hip flexion 4+ 4+  Hip extension    Hip abduction 4+ 4  Hip adduction    Hip internal rotation  4+ 4+ w/p!  Hip external rotation 4+ 4+w/p!  Knee flexion 4+ 4+  Knee extension 5 5  Ankle dorsiflexion    Ankle plantarflexion    Ankle inversion    Ankle eversion     (Blank rows = not tested)   LUMBAR ROM:   Active  A/PROM  eval  Flexion WFL  Extension WFL  Right lateral flexion WFL  Left lateral flexion WFL  Right rotation Min w/p!  Left rotation WFL   (Blank rows = not tested)    FUNCTIONAL TESTS:    GAIT: Distance walked: 25 ft Assistive device utilized: None Level of assistance: Complete Independence Comments: Slight R hip hike   TODAY'S TREATMENT:  DATE: 12/23/2022   Therapeutic Exercise: Aerobic: Supine: Pelvic tilt x20, SKTC bil 3x20" Seated: Prone:  Standing: Stretches:  Neuromuscular Re-education: Manual Therapy: Therapeutic Activity: Self Care:     PATIENT EDUCATION:  PATIENT EDUCATION:  Education details: PT POC, Exam findings, HEP Person educated: Patient Education method: Explanation, Demonstration, Tactile cues, Verbal cues, and Handouts Education comprehension: verbalized understanding, returned demonstration, verbal cues required, tactile cues required, and needs further education   HOME EXERCISE PROGRAM: Access Code: [email or web address] URL: [email or web address] Date: 12/22/2022 Prepared by: Nechama Guard  Exercises - Supine Single Knee to Chest Stretch  - 1-2 x daily - 7 x weekly - 3 sets - 10 reps - 20-30 secs hold - Supine Posterior Pelvic Tilt  - 1-2 x daily - 7 x weekly - 3 sets - 10 reps  ASSESSMENT:  CLINICAL IMPRESSION: Patient is a 72 y.o.f who was seen today for physical therapy evaluation and treatment for Left sided lower back and Bil leg pain. The pt's primary impairments include lumbopelvic mobility deficits, Bil LEs and lumbopelvic muscular performance deficits, and balance deficits.  These deficits limit her ability to perform work related tasks, sit for prolonged periods of time, sleep, squat, kneel to the floor, and carry heavy items. Pt would benefit from skilled care to address the above impairments.   OBJECTIVE IMPAIRMENTS: decreased activity tolerance, decreased balance, decreased endurance, decreased mobility, decreased ROM, decreased strength, hypomobility, impaired flexibility, improper body mechanics, and pain.   ACTIVITY LIMITATIONS: carrying, lifting, bending, sitting, standing, squatting, stairs, and locomotion level  PARTICIPATION LIMITATIONS: cleaning and occupation  PERSONAL FACTORS: Past/current experiences, Profession, and 3+ comorbidities: Primary HTN, PAD, hypothyroidism, DM Type 2, osteopenia, and stage 3a kidney disease are also affecting patient's functional outcome.   REHAB POTENTIAL: Good  CLINICAL DECISION MAKING: Stable/uncomplicated  EVALUATION COMPLEXITY: Low    GOALS: Goals reviewed with patient? Yes  SHORT TERM GOALS: Target date: 01/12/2023  Pt will be independent in initial HEP Goal status: INITIAL  2.  Pt will demo right lumbar rotation with <3/10 pain/limitation. Goal status: INITIAL   LONG TERM GOALS: Target date: 02/17/2023  Pt will be independent in final HEP  Goal status: INITIAL  2.  Pt will report 75% improvement in the ability to navigate stairs. Goal status: INITIAL  3.  Pt will report ability to sit for at least 30 minutes with pain <3/10. Goal status: INITIAL  4.  Pt will report 50% improvement in her ability to squat and kneel down to the floor to promote return to work tasks.  Goal status: INITIAL  5. Pt will improve FOTO score by at least 10 points.  Goal Status: INITIAL   PLAN:  PT FREQUENCY: 1-2x/week  PT DURATION: 8 weeks  PLANNED INTERVENTIONS: 97110-Therapeutic exercises, 97530- Therapeutic activity, 97112- Neuromuscular re-education, [phone]- Self Care, 19147- Manual therapy, [phone]- Gait  training, [phone]- Aquatic Therapy, [phone]- Ionotophoresis 4mg /ml Dexamethasone, Patient/Family education, Balance training, Stair training, Taping, Dry Needling, Joint mobilization, Joint manipulation, Spinal manipulation, Spinal mobilization, Cryotherapy, and Moist heat  PLAN FOR NEXT SESSION: Assess response to HEP    [employer], Student-PT  12/23/2022, 9:50 AM  This entire session was performed under direct supervision and direction of a licensed therapist/therapist assistant . I have personally read, edited and approve of the note as written.   Sedalia Muta, PT, DPT 9:37 AM  12/28/22

## 2022-12-24 ENCOUNTER — Other Ambulatory Visit: Payer: Self-pay | Admitting: Internal Medicine

## 2022-12-24 DIAGNOSIS — E039 Hypothyroidism, unspecified: Secondary | ICD-10-CM

## 2022-12-30 ENCOUNTER — Encounter: Payer: Medicare Other | Admitting: Physical Therapy

## 2023-01-01 ENCOUNTER — Encounter: Payer: Medicare Other | Admitting: Physical Medicine & Rehabilitation

## 2023-01-03 ENCOUNTER — Other Ambulatory Visit: Payer: Self-pay | Admitting: Internal Medicine

## 2023-01-05 ENCOUNTER — Encounter: Payer: Medicare Other | Admitting: Physical Therapy

## 2023-01-11 ENCOUNTER — Encounter: Payer: Self-pay | Admitting: Internal Medicine

## 2023-01-11 ENCOUNTER — Ambulatory Visit (INDEPENDENT_AMBULATORY_CARE_PROVIDER_SITE_OTHER): Payer: Medicare Other | Admitting: Internal Medicine

## 2023-01-11 ENCOUNTER — Encounter: Payer: Self-pay | Admitting: Physical Therapy

## 2023-01-11 ENCOUNTER — Ambulatory Visit (INDEPENDENT_AMBULATORY_CARE_PROVIDER_SITE_OTHER): Payer: Medicare Other | Admitting: Physical Therapy

## 2023-01-11 VITALS — BP 126/68 | HR 67 | Temp 98.0°F | Resp 16 | Ht 59.5 in | Wt 171.8 lb

## 2023-01-11 DIAGNOSIS — R10814 Left lower quadrant abdominal tenderness: Secondary | ICD-10-CM | POA: Diagnosis not present

## 2023-01-11 DIAGNOSIS — E039 Hypothyroidism, unspecified: Secondary | ICD-10-CM

## 2023-01-11 DIAGNOSIS — L2084 Intrinsic (allergic) eczema: Secondary | ICD-10-CM | POA: Insufficient documentation

## 2023-01-11 DIAGNOSIS — R300 Dysuria: Secondary | ICD-10-CM | POA: Insufficient documentation

## 2023-01-11 DIAGNOSIS — E118 Type 2 diabetes mellitus with unspecified complications: Secondary | ICD-10-CM

## 2023-01-11 DIAGNOSIS — M5459 Other low back pain: Secondary | ICD-10-CM

## 2023-01-11 DIAGNOSIS — K5792 Diverticulitis of intestine, part unspecified, without perforation or abscess without bleeding: Secondary | ICD-10-CM

## 2023-01-11 DIAGNOSIS — E785 Hyperlipidemia, unspecified: Secondary | ICD-10-CM | POA: Diagnosis not present

## 2023-01-11 LAB — CBC WITH DIFFERENTIAL/PLATELET
Basophils Absolute: 0 10*3/uL (ref 0.0–0.1)
Basophils Relative: 0.8 % (ref 0.0–3.0)
Eosinophils Absolute: 0.1 10*3/uL (ref 0.0–0.7)
Eosinophils Relative: 1.4 % (ref 0.0–5.0)
HCT: 42.2 % (ref 36.0–46.0)
Hemoglobin: 14.4 g/dL (ref 12.0–15.0)
Lymphocytes Relative: 27.3 % (ref 12.0–46.0)
Lymphs Abs: 1.1 10*3/uL (ref 0.7–4.0)
MCHC: 34.1 g/dL (ref 30.0–36.0)
MCV: 99 fL (ref 78.0–100.0)
Monocytes Absolute: 0.3 10*3/uL (ref 0.1–1.0)
Monocytes Relative: 7.5 % (ref 3.0–12.0)
Neutro Abs: 2.6 10*3/uL (ref 1.4–7.7)
Neutrophils Relative %: 63 % (ref 43.0–77.0)
Platelets: 262 10*3/uL (ref 150.0–400.0)
RBC: 4.26 Mil/uL (ref 3.87–5.11)
RDW: 14.2 % (ref 11.5–15.5)
WBC: 4.1 10*3/uL (ref 4.0–10.5)

## 2023-01-11 LAB — C-REACTIVE PROTEIN: CRP: <1 mg/dL (ref 0.5–20.0)

## 2023-01-11 LAB — URINALYSIS, ROUTINE W REFLEX MICROSCOPIC
Bilirubin Urine: NEGATIVE
Hgb urine dipstick: NEGATIVE
Ketones, ur: NEGATIVE
Leukocytes,Ua: NEGATIVE
Nitrite: NEGATIVE
RBC / HPF: NONE SEEN (ref 0–?)
Specific Gravity, Urine: 1.02 (ref 1.000–1.030)
Total Protein, Urine: NEGATIVE
Urine Glucose: >=1000 — AB
Urobilinogen, UA: 0.2 (ref 0.0–1.0)
pH: 5.5 (ref 5.0–8.0)

## 2023-01-11 LAB — LIPID PANEL
Cholesterol: 249 mg/dL — ABNORMAL HIGH (ref 0–200)
HDL: 48.5 mg/dL (ref >39.00)
LDL Cholesterol: 173 mg/dL — ABNORMAL HIGH (ref 0–99)
NonHDL: 200.6
Total CHOL/HDL Ratio: 5
Triglycerides: 136 mg/dL (ref 0.0–149.0)
VLDL: 27.2 mg/dL (ref 0.0–40.0)

## 2023-01-11 LAB — LIPASE: Lipase: 28 U/L (ref 11.0–59.0)

## 2023-01-11 LAB — HEMOGLOBIN A1C: Hgb A1c MFr Bld: 6.6 % — ABNORMAL HIGH (ref 4.6–6.5)

## 2023-01-11 LAB — TSH: TSH: 5.2 u[IU]/mL (ref 0.35–5.50)

## 2023-01-11 MED ORDER — FLUOCINONIDE EMULSIFIED BASE 0.05 % EX CREA
1.0000 | TOPICAL_CREAM | Freq: Two times a day (BID) | CUTANEOUS | 1 refills | Status: DC
Start: 2023-01-11 — End: 2023-12-24

## 2023-01-11 MED ORDER — LEVOCETIRIZINE DIHYDROCHLORIDE 5 MG PO TABS: 5.0000 mg | ORAL_TABLET | Freq: Every evening | ORAL | 1 refills | Status: DC

## 2023-01-11 NOTE — Therapy (Addendum)
 OUTPATIENT PHYSICAL THERAPY LOWER EXTREMITY TREATMENT   Patient Name: [name]  Mira MRN: [id number] DOB:Mar 11, 1950, 72 y.o., female Today's Date: 01/11/2023  END OF SESSION:  PT End of Session - 01/11/23 0855     Visit Number 2    Number of Visits 16    Date for PT Re-Evaluation 02/16/23    Authorization Type Medicare    PT Start Time 0850    PT Stop Time 0930    PT Time Calculation (min) 40 min    Behavior During Therapy [facility] for tasks assessed/performed             Past Medical History:  Diagnosis Date   Acute pyelonephritis 05/02/2012   Asthma    Diverticulitis 03/24/2017   see report [place] [facility]   Dyspnea    nl PFTs, and echo   GERD (gastroesophageal reflux disease)    Headache(784.0)    Heart murmur    History of echocardiogram    Echocardiogram 7/23: EF 65-70, no RWMA, Gr 1 DD, normal RVSF, inadequate TR signal to est PASP, trivial MR, AV sclerosis w/o AS   History of kidney stones    IBS (irritable bowel syndrome)    Nephrolithiasis    Pre-diabetes 12/2013   Recurrent oral ulcers    Thyroid  goiter    I 131 rx and then  total throidectomy 2005 [facility]   Past Surgical History:  Procedure Laterality Date   ABDOMINAL HYSTERECTOMY  1994   fibroid   APPENDECTOMY  1968   CYSTOSCOPY WITH STENT PLACEMENT Bilateral 03/24/2017   Procedure: BILATERAL URETERAL CATHETER  PLACEMENT;  Surgeon: Osborn Blaze, MD;  Location: [facility];  Service: Urology;  Laterality: Bilateral;   CYSTOSCOPY/RETROGRADE/URETEROSCOPY  03/24/2017   Procedure: CYSTOSCOPY/RETROGRADE/URETEROSCOPY;  Surgeon: Osborn Blaze, MD;  Location: [facility];  Service: Urology;;   LAPAROSCOPIC PARTIAL COLECTOMY Left 03/24/2017   Procedure: LAPAROSCOPIC ASSISTED LEFT  SIGMOID COLECTOMY;  Surgeon: Juanita Norlander, MD;  Location: [facility];  Service: General;  Laterality: Left;   orif left tivial plateau fracture     Dr. Littie Rife 2/08   plates and pins take out of left knee  06/2008   RIGHT/LEFT  HEART CATH AND CORONARY ANGIOGRAPHY N/A 07/24/2021   Procedure: RIGHT/LEFT HEART CATH AND CORONARY ANGIOGRAPHY;  Surgeon: Millicent Ally, MD;  Location: [facility];  Service: Cardiovascular;  Laterality: N/A;   SIGMOIDOSCOPY  03/24/2017   Procedure: SIGMOIDOSCOPY;  Surgeon: Juanita Norlander, MD;  Location: [facility];  Service: General;;   TONSILLECTOMY  1979   TOTAL THYROIDECTOMY  2005   for  large nodule 2006   Patient Active Problem List   Diagnosis Date Noted   Left lower quadrant abdominal tenderness without rebound tenderness 01/11/2023   Dysuria 01/11/2023   Intrinsic eczema 01/11/2023   Foraminal stenosis of lumbar region 09/29/2022   PAD (peripheral artery disease) (HCC) 01/07/2022   Dyslipidemia, goal LDL below 70 01/06/2022   Stage 3a chronic kidney disease (HCC) 01/06/2022   Obstructive sleep apnea 09/08/2021   Primary hypertension 06/30/2021   Type II diabetes mellitus with manifestations (HCC) 06/30/2021   Osteopenia 04/25/2010   CATARACT, LEFT EYE 11/27/2009   Obesity (BMI 30-39.9) 01/12/2008   VITAMIN D  DEFICIENCY 11/25/2006   Hypothyroidism 10/04/2006   RLS (restless legs syndrome) 10/04/2006   GERD 09/30/2006    PCP: Arcadio Knuckles, MD  REFERRING PROVIDER: Genetta Kenning, MD  REFERRING DIAG: Spondylosis without myelopathy or radiculopathy, lumbar region   THERAPY DIAG:  Other low back pain  Rationale for Evaluation and Treatment: Rehabilitation  SUBJECTIVE:   SUBJECTIVE STATEMENT: Pt states continued discomfort in legs and back.    Eval: Pt states the pain in her lower back and in her legs began insidiously. She is unable to state when her pain began but has had back pain for over 5 years. Pt reports having 8 surgeries on L LE. 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 She has difficulty with stairs, getting down onto the floor, or on her knees, and  sitting for long periods of time  PERTINENT HISTORY: Primary HTN, PAD, Hypothyroidism, DM Type 2, Osteopenia, Stage 3a kidney disease  PAIN:  Are you having pain? Yes: NPRS scale: up to 10 /10 Pain location: Left side Lumbar Pain description: dull, constant, ache,  Aggravating factors: Sitting (time depends), pulling, bending, twist  Relieving factors: using the jets in bathroom, RLS medicine, Tylenol  x3 day,   Yes: NPRS scale: 8 rest /10 Pain location: Bil legs Pain description: tightness, and constant Aggravating factors: sitting (time depends), pulling, bending, twist  Relieving factors: using the jets in bathroom, RLS medicine, Tylenol  x3 day, PRECAUTIONS: None  RED FLAGS: None   WEIGHT BEARING RESTRICTIONS: No  FALLS:  Has patient fallen in last 6 months? 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  DIAGNOSTIC FINDINGS: N/a  PATIENT SURVEYS:  FOTO 43 Eval (12/22/22)  COGNITION: Overall cognitive status: Within functional limits for tasks assessed     SENSATION:   MUSCLE LENGTH: Hamstrings: Right Min restriction ; Left Min restriction w/ p!  Piriformis test: Right WFL deg; Left Min restriction with p!    POSTURE: decreased lumbar lordosis  PALPATION: TTP midline and TP of L2-L5; L glute   LOWER EXTREMITY ROM:  Active ROM Right eval Left eval  Hip flexion    Hip extension    Hip abduction    Hip adduction    Hip internal rotation    Hip external rotation    Knee flexion    Knee extension    Ankle dorsiflexion    Ankle plantarflexion    Ankle inversion    Ankle eversion     (Blank rows = not tested)  LOWER EXTREMITY MMT:  MMT Right eval Left eval  Hip flexion 4+ 4+  Hip extension    Hip abduction 4+ 4  Hip adduction     Hip internal rotation 4+ 4+ w/p!  Hip external rotation 4+ 4+w/p!  Knee flexion 4+ 4+  Knee extension 5 5  Ankle dorsiflexion    Ankle plantarflexion    Ankle inversion    Ankle eversion     (Blank rows = not tested)   LUMBAR ROM:   Active  A/PROM  eval  Flexion WFL  Extension WFL  Right lateral flexion WFL  Left lateral flexion WFL  Right rotation Min w/p!  Left rotation WFL   (Blank rows = not tested)   FUNCTIONAL TESTS:    GAIT: Distance walked: 25 ft Assistive device utilized: None Level of assistance: Complete Independence Comments: Slight R hip hike   TODAY'S TREATMENT:  DATE:   01/11/2023 Therapeutic Exercise: Aerobic:  bike L1 x 7 min;  Supine: Pelvic tilt x20,  SKTC bil 1x20"; LTR x 10;  Seated: Prone:  Standing: Stretches:   seated HSS 30 sec x 3 bil;   gastroc stretch at wall 30 sec x 2 bil;  Neuromuscular Re-education: Manual Therapy: Therapeutic Activity: Self Care:    PATIENT EDUCATION:  PATIENT EDUCATION:  Education details: Updated and reviewed HEP.  Person educated: Patient Education method: Explanation, Demonstration, Tactile cues, Verbal cues, and Handouts Education comprehension: verbalized understanding, returned demonstration, verbal cues required, tactile cues required, and needs further education   HOME EXERCISE PROGRAM: Access Code: [email or web address]   ASSESSMENT:  CLINICAL IMPRESSION: Pt with good ability for ther ex today, educated on gastroc and HS stretching when legs feeling painful and crampy. Also discussed trying for more exercise daily, walk or stationary bike. Plan to progress strength and mobility as tolerated.   Eval: Patient is a 72 y.o.f who was seen today for physical therapy evaluation and treatment for Left sided lower back and Bil leg pain. The pt's primary impairments include lumbopelvic  mobility deficits, Bil LEs and lumbopelvic muscular performance deficits, and balance deficits. These deficits limit her ability to perform work related tasks, sit for prolonged periods of time, sleep, squat, kneel to the floor, and carry heavy items. Pt would benefit from skilled care to address the above impairments.   OBJECTIVE IMPAIRMENTS: decreased activity tolerance, decreased balance, decreased endurance, decreased mobility, decreased ROM, decreased strength, hypomobility, impaired flexibility, improper body mechanics, and pain.   ACTIVITY LIMITATIONS: carrying, lifting, bending, sitting, standing, squatting, stairs, and locomotion level  PARTICIPATION LIMITATIONS: cleaning and occupation  PERSONAL FACTORS: Past/current experiences, Profession, and 3+ comorbidities: Primary HTN, PAD, hypothyroidism, DM Type 2, osteopenia, and stage 3a kidney disease are also affecting patient's functional outcome.   REHAB POTENTIAL: Good  CLINICAL DECISION MAKING: Stable/uncomplicated  EVALUATION COMPLEXITY: Low   GOALS: Goals reviewed with patient? Yes  SHORT TERM GOALS: Target date: 01/12/2023  Pt will be independent in initial HEP Goal status: INITIAL  2.  Pt will demo right lumbar rotation with <3/10 pain/limitation. Goal status: INITIAL   LONG TERM GOALS: Target date: 02/17/2023  Pt will be independent in final HEP  Goal status: INITIAL  2.  Pt will report 75% improvement in the ability to navigate stairs. Goal status: INITIAL  3.  Pt will report ability to sit for at least 30 minutes with pain <3/10. Goal status: INITIAL  4.  Pt will report 50% improvement in her ability to squat and kneel down to the floor to promote return to work tasks.  Goal status: INITIAL  5. Pt will improve FOTO score by at least 10 points.  Goal Status: INITIAL   PLAN:  PT FREQUENCY: 1-2x/week  PT DURATION: 8 weeks  PLANNED INTERVENTIONS: 97110-Therapeutic exercises, 97530- Therapeutic  activity, 97112- Neuromuscular re-education, [phone]- Self Care, 86578- Manual therapy, [phone]- Gait training, [phone]- Aquatic Therapy, [phone]- Ionotophoresis 4mg /ml Dexamethasone , Patient/Family education, Balance training, Stair training, Taping, Dry Needling, Joint mobilization, Joint manipulation, Spinal manipulation, Spinal mobilization, Cryotherapy, and Moist heat  PLAN FOR NEXT SESSION:  standing ther ex, march, HR, squats  Terrilee Few, PT, DPT 9:07 AM  01/11/23   PHYSICAL THERAPY DISCHARGE SUMMARY  Visits from Start of Care: 2   Plan: Patient agrees to discharge.  Patient goals were not met. Patient is being discharged due to- not returning since last visit.   Terrilee Few, PT, DPT 10:33 AM  06/28/23

## 2023-01-11 NOTE — Patient Instructions (Signed)
Abdominal Pain, Adult  Pain in the abdomen (abdominal pain) can be caused by many things. In most cases, it gets better with no treatment or by being treated at home. But in some cases, it can be serious. Your health care provider will ask questions about your medical history and do a physical exam to try to figure out what is causing your pain. Follow these instructions at home: Medicines Take over-the-counter and prescription medicines only as told by your provider. Do not take medicines that help you poop (laxatives) unless told by your provider. General instructions Watch your condition for any changes. Drink enough fluid to keep your pee (urine) pale yellow. Contact a health care provider if: Your pain changes, gets worse, or lasts longer than expected. You have severe cramping or bloating in your abdomen, or you vomit. Your pain gets worse with meals, after eating, or with certain foods. You are constipated or have diarrhea for more than 2-3 days. You are not hungry, or you lose weight without trying. You have signs of dehydration. These may include: Dark pee, very little pee, or no pee. Cracked lips or dry mouth. Sleepiness or weakness. You have pain when you pee (urinate) or poop. Your abdominal pain wakes you up at night. You have blood in your pee. You have a fever. Get help right away if: You cannot stop vomiting. Your pain is only in one part of the abdomen. Pain on the right side could be caused by appendicitis. You have bloody or black poop (stool), or poop that looks like tar. You have trouble breathing. You have chest pain. These symptoms may be an emergency. Get help right away. Call 911. Do not wait to see if the symptoms will go away. Do not drive yourself to the hospital. This information is not intended to replace advice given to you by your health care provider. Make sure you discuss any questions you have with your health care provider. Document Revised:  10/29/2021 Document Reviewed: 10/29/2021 Elsevier Patient Education  2024 [employer]  

## 2023-01-11 NOTE — Progress Notes (Signed)
Subjective:  Patient ID: [name], female    DOB: Apr 25, 1950  Age: 72 y.o. MRN: [id number]  CC: Hyperlipidemia, Diabetes, Hypothyroidism, and Abdominal Pain   HPI [place] presents for f/up ---  Discussed the use of AI scribe software for clinical note transcription with the patient, who gave verbal consent to proceed.  History of Present Illness   The patient presents with a chief complaint of pruritic skin lesions that appeared approximately a week ago. The lesions, initially thought to be dry skin, are located on the legs and have become increasingly red. The patient has been managing the itchiness with daily application of non-medicated lotion, but has not tried any steroids or antihistamines.  In addition to the skin lesions, the patient reports left lower quadrant pain that has been ongoing for about a week.   The patient also notes that her urine has been dark in color for the past couple of days. She denies any pain or blood in the urine. The patient has a history of urinary tract infection and was seen at an urgent care center for this issue in September. She has been on Gemtesa for overactive bladder as prescribed by a urologist.  Overall, the patient reports feeling well and staying active. She denies any recent episodes of increased heart rate, chest pain, shortness of breath, dizziness, or lightheadedness.       Outpatient Medications Prior to Visit  Medication Sig Dispense Refill   acetaminophen (TYLENOL) 500 MG tablet Take 1,000 mg by mouth 3 (three) times daily.     aspirin EC 81 MG tablet Take 1 tablet (81 mg total) by mouth daily. Swallow whole. 90 tablet 1   Blood Glucose Monitoring Suppl (FREESTYLE LITE) w/Device KIT USE TWO TIMES A DAY TO TEST GLUCOSE CONTROL 1 kit 0   Cholecalciferol (VITAMIN D3) 50 MCG (2000 UT) capsule Take 1 capsule (2,000 Units total) by mouth daily. 90 capsule 1   dapagliflozin propanediol (FARXIGA) 10 MG TABS tablet  Take 1 tablet (10 mg total) by mouth daily before breakfast. 90 tablet 3   FREESTYLE LITE test strip USE 1 STRIP TO TEST TWICE A DAY 50 strip 0   GEMTESA 75 MG TABS Take 75 mg by mouth daily.     Insulin Pen Needle 32G X 6 MM MISC 1 Act by Does not apply route once a week. 30 each 0   nitroGLYCERIN (NITROSTAT) 0.4 MG SL tablet Place 1 tablet (0.4 mg total) under the tongue every 5 (five) minutes as needed for chest pain. 25 tablet 3   olmesartan (BENICAR) 20 MG tablet TAKE 1 TABLET BY MOUTH DAILY 90 tablet 0   pantoprazole (PROTONIX) 40 MG tablet TAKE 1 TABLET BY MOUTH 2 TIMES A DAY 60 tablet 0   rOPINIRole (REQUIP) 0.5 MG tablet TAKE 1 OR 2 TABLETS BY MOUTH DAILY AT NOON AND AT 5:00PM , THEN TAKE 3 TABLETS AT BEDTIME 360 tablet 0   SYNTHROID 112 MCG tablet TAKE 1 TABLET DAILY WITH BREAKFAST FOR HYPOTHYROIDISM (ANNUAL APPOINTMENT DUE IN JUNE MUST SEE PROVIDER FOR FUTURE REFILLS) 90 tablet 0   diphenhydrAMINE-zinc acetate (BENADRYL EXTRA STRENGTH) cream Apply 1 Application topically 3 (three) times daily as needed for itching. (Patient not taking: Reported on 01/11/2023) 28.4 g 0   metFORMIN (GLUCOPHAGE-XR) 750 MG 24 hr tablet Take 1 tablet (750 mg total) by mouth daily with breakfast. 90 tablet 1   rosuvastatin (CRESTOR) 10 MG tablet Take 1 tablet (10  mg total) by mouth daily. 90 tablet 1   No facility-administered medications prior to visit.    ROS Review of Systems  Constitutional:  Negative for appetite change, chills, diaphoresis, fatigue and fever.  HENT: Negative.    Eyes: Negative.   Respiratory:  Negative for cough, chest tightness, shortness of breath and wheezing.   Cardiovascular:  Negative for chest pain, palpitations and leg swelling.  Gastrointestinal:  Positive for abdominal pain. Negative for constipation, diarrhea, nausea and vomiting.  Endocrine: Negative.  Negative for polydipsia, polyphagia and polyuria.  Genitourinary:  Positive for dysuria and frequency. Negative for  difficulty urinating, flank pain, hematuria, pelvic pain and urgency.  Musculoskeletal:  Positive for back pain. Negative for arthralgias and myalgias.  Skin:  Positive for rash.  Neurological: Negative.  Negative for dizziness and weakness.  Hematological:  Negative for adenopathy. Does not bruise/bleed easily.  Psychiatric/Behavioral: Negative.      Objective:  BP 126/68 (BP Location: Left Arm, Patient Position: Sitting, Cuff Size: Normal)   Pulse 67   Temp 98 F (36.7 C) (Oral)   Resp 16   Ht 4' 11.5" (1.511 m)   Wt 171 lb 12.8 oz (77.9 kg)   SpO2 94%   BMI 34.12 kg/m   BP Readings from Last 3 Encounters:  01/11/23 126/68  12/21/22 119/70  11/17/22 (!) 145/82    Wt Readings from Last 3 Encounters:  01/11/23 171 lb 12.8 oz (77.9 kg)  12/21/22 174 lb 12.8 oz (79.3 kg)  11/17/22 174 lb 12.8 oz (79.3 kg)    Physical Exam Vitals reviewed.  Constitutional:      Appearance: She is not ill-appearing.  HENT:     Mouth/Throat:     Mouth: Mucous membranes are moist.  Eyes:     General: No scleral icterus.    Conjunctiva/sclera: Conjunctivae normal.  Cardiovascular:     Rate and Rhythm: Normal rate and regular rhythm.     Heart sounds: Murmur heard.     Systolic murmur is present with a grade of 1/6.     Comments: 1/6 SEM RUSB Pulmonary:     Effort: Pulmonary effort is normal.     Breath sounds: No stridor. No wheezing, rhonchi or rales.  Abdominal:     General: Abdomen is flat. Bowel sounds are normal. There is no distension.     Palpations: There is no hepatomegaly, splenomegaly or mass.     Tenderness: There is abdominal tenderness in the left lower quadrant. There is no guarding or rebound.     Hernia: No hernia is present.  Musculoskeletal:     Cervical back: Neck supple.     Right lower leg: No edema.     Left lower leg: No edema.  Skin:    Findings: Rash present.  Neurological:     Mental Status: She is alert. Mental status is at baseline.  Psychiatric:         Mood and Affect: Mood normal.        Behavior: Behavior normal.     Lab Results  Component Value Date   WBC 4.1 01/11/2023   HGB 14.4 01/11/2023   HCT 42.2 01/11/2023   PLT 262.0 01/11/2023   GLUCOSE 86 10/06/2022   CHOL 249 (H) 01/11/2023   TRIG 136.0 01/11/2023   HDL 48.50 01/11/2023   LDLDIRECT 142.0 01/28/2016   LDLCALC 173 (H) 01/11/2023   ALT 26 10/06/2022   AST 19 10/06/2022   NA 136 10/06/2022   K 4.0 10/06/2022  CL 101 10/06/2022   CREATININE 0.86 10/06/2022   BUN 15 10/06/2022   CO2 25 10/06/2022   TSH 5.20 01/11/2023   INR 1.12 03/17/2017   HGBA1C 6.6 (H) 01/11/2023   MICROALBUR <0.7 06/24/2022    No results found.  Assessment & Plan:  Acquired hypothyroidism- She is euthyroid. -     TSH; Future  Type II diabetes mellitus with manifestations (HCC) - Her blood sugar is well controlled. -     Hemoglobin A1c; Future -     HM Diabetes Foot Exam  Dyslipidemia, goal LDL below 70 -     Lipid panel; Future  Left lower quadrant abdominal tenderness without rebound tenderness -     CBC with Differential/Platelet; Future -     Urinalysis, Routine w reflex microscopic; Future -     Lipase; Future -     C-reactive protein; Future  Dysuria -     Urinalysis, Routine w reflex microscopic; Future -     CULTURE, URINE COMPREHENSIVE; Future  Intrinsic eczema -     Fluocinonide Emulsified Base; Apply 1 Application topically 2 (two) times daily.  Dispense: 60 g; Refill: 1 -     Levocetirizine Dihydrochloride; Take 1 tablet (5 mg total) by mouth every evening.  Dispense: 90 tablet; Refill: 1  Diverticulitis -     Sulfamethoxazole-Trimethoprim; Take 1 tablet by mouth 2 (two) times daily for 7 days.  Dispense: 14 tablet; Refill: 0     Follow-up: Return in about 3 months (around 04/11/2023).  Sanda Linger, MD

## 2023-01-12 ENCOUNTER — Encounter: Payer: Self-pay | Admitting: Internal Medicine

## 2023-01-12 DIAGNOSIS — K5792 Diverticulitis of intestine, part unspecified, without perforation or abscess without bleeding: Secondary | ICD-10-CM | POA: Insufficient documentation

## 2023-01-12 MED ORDER — SULFAMETHOXAZOLE-TRIMETHOPRIM 800-160 MG PO TABS
1.0000 | ORAL_TABLET | Freq: Two times a day (BID) | ORAL | 0 refills | Status: AC
Start: 2023-01-12 — End: 2023-01-19

## 2023-01-13 ENCOUNTER — Encounter: Payer: Medicare Other | Admitting: Physical Therapy

## 2023-01-14 LAB — CULTURE, URINE COMPREHENSIVE

## 2023-01-19 ENCOUNTER — Encounter: Payer: Medicare Other | Admitting: Physical Therapy

## 2023-01-21 ENCOUNTER — Encounter: Payer: Medicare Other | Attending: Physical Medicine & Rehabilitation | Admitting: Physical Medicine & Rehabilitation

## 2023-01-21 DIAGNOSIS — M47816 Spondylosis without myelopathy or radiculopathy, lumbar region: Secondary | ICD-10-CM | POA: Insufficient documentation

## 2023-01-24 ENCOUNTER — Other Ambulatory Visit: Payer: Self-pay | Admitting: Internal Medicine

## 2023-01-24 DIAGNOSIS — G2581 Restless legs syndrome: Secondary | ICD-10-CM

## 2023-02-02 DIAGNOSIS — R197 Diarrhea, unspecified: Secondary | ICD-10-CM | POA: Diagnosis not present

## 2023-02-02 DIAGNOSIS — R1032 Left lower quadrant pain: Secondary | ICD-10-CM | POA: Diagnosis not present

## 2023-02-02 DIAGNOSIS — K5792 Diverticulitis of intestine, part unspecified, without perforation or abscess without bleeding: Secondary | ICD-10-CM | POA: Diagnosis not present

## 2023-02-03 DIAGNOSIS — R1032 Left lower quadrant pain: Secondary | ICD-10-CM | POA: Diagnosis not present

## 2023-02-07 ENCOUNTER — Other Ambulatory Visit: Payer: Self-pay | Admitting: Internal Medicine

## 2023-02-18 DIAGNOSIS — R350 Frequency of micturition: Secondary | ICD-10-CM | POA: Diagnosis not present

## 2023-02-18 DIAGNOSIS — N952 Postmenopausal atrophic vaginitis: Secondary | ICD-10-CM | POA: Diagnosis not present

## 2023-02-18 DIAGNOSIS — R3915 Urgency of urination: Secondary | ICD-10-CM | POA: Diagnosis not present

## 2023-03-04 ENCOUNTER — Ambulatory Visit (INDEPENDENT_AMBULATORY_CARE_PROVIDER_SITE_OTHER)
Admission: RE | Admit: 2023-03-04 | Discharge: 2023-03-04 | Disposition: A | Discharge status: Home / Self Care | Payer: Medicare Other | Source: Ambulatory Visit | Attending: Internal Medicine | Admitting: Internal Medicine

## 2023-03-04 ENCOUNTER — Encounter: Payer: Self-pay | Admitting: Internal Medicine

## 2023-03-04 ENCOUNTER — Other Ambulatory Visit: Payer: Self-pay | Admitting: Internal Medicine

## 2023-03-04 DIAGNOSIS — Z780 Asymptomatic menopausal state: Secondary | ICD-10-CM

## 2023-03-04 DIAGNOSIS — Z0000 Encounter for general adult medical examination without abnormal findings: Secondary | ICD-10-CM

## 2023-03-04 DIAGNOSIS — I10 Essential (primary) hypertension: Secondary | ICD-10-CM

## 2023-03-04 DIAGNOSIS — M85851 Other specified disorders of bone density and structure, right thigh: Secondary | ICD-10-CM | POA: Diagnosis not present

## 2023-03-08 ENCOUNTER — Ambulatory Visit: Payer: Medicare Other | Admitting: Internal Medicine

## 2023-03-08 ENCOUNTER — Encounter: Payer: Self-pay | Admitting: Internal Medicine

## 2023-03-08 VITALS — BP 124/80 | HR 92 | Temp 98.7°F | Ht 59.5 in | Wt 173.0 lb

## 2023-03-08 DIAGNOSIS — R6889 Other general symptoms and signs: Secondary | ICD-10-CM

## 2023-03-08 LAB — POCT RESPIRATORY SYNCYTIAL VIRUS: RSV Rapid Ag: NEGATIVE

## 2023-03-08 LAB — POCT INFLUENZA A/B
Influenza A, POC: POSITIVE — AB
Influenza B, POC: NEGATIVE

## 2023-03-08 LAB — POC COVID19 BINAXNOW: SARS Coronavirus 2 Ag: NEGATIVE

## 2023-03-08 MED ORDER — BENZONATATE 200 MG PO CAPS
200.0000 mg | ORAL_CAPSULE | Freq: Three times a day (TID) | ORAL | 0 refills | Status: DC | PRN
Start: 2023-03-08 — End: 2023-04-01

## 2023-03-08 MED ORDER — METHYLPREDNISOLONE ACETATE 40 MG/ML IJ SUSP
40.0000 mg | Freq: Once | INTRAMUSCULAR | Status: AC
  Administered 2023-03-08: 40 mg via INTRAMUSCULAR

## 2023-03-08 MED ORDER — PROMETHAZINE-DM 6.25-15 MG/5ML PO SYRP: 5.0000 mL | ORAL_SOLUTION | Freq: Four times a day (QID) | ORAL | 0 refills | Status: DC | PRN

## 2023-03-08 NOTE — Patient Instructions (Addendum)
 We have sent in the tessalon  perles to use up to 3 times a day for cough.   We have sent in promethazine /dm cough syrup to use at night time for cough.

## 2023-03-08 NOTE — Progress Notes (Signed)
   Subjective:   Patient ID: [name], female    DOB: 04/14/1950, 73 y.o.   MRN: [id number]  Cough Associated symptoms include myalgias, postnasal drip, rhinorrhea and shortness of breath. Pertinent negatives include no chills, ear pain, fever, sore throat or wheezing.   The patient is a 73 YO female coming in for cold symptoms 4 days. Some SOB and congestion and cough is productive.   Review of Systems  Constitutional:  Positive for activity change and appetite change. Negative for chills, fatigue, fever and unexpected weight change.  HENT:  Positive for congestion, postnasal drip, rhinorrhea and sinus pressure. Negative for ear discharge, ear pain, sinus pain, sneezing, sore throat, tinnitus, trouble swallowing and voice change.   Eyes: Negative.   Respiratory:  Positive for cough and shortness of breath. Negative for chest tightness and wheezing.   Cardiovascular: Negative.   Gastrointestinal: Negative.   Musculoskeletal:  Positive for myalgias.  Neurological: Negative.     Objective:  Physical Exam Constitutional:      Appearance: She is well-developed.  HENT:     Head: Normocephalic and atraumatic.     Comments: Oropharynx with redness and clear drainage, nose with swollen turbinates, TMs normal bilaterally.  Neck:     Thyroid: No thyromegaly.  Cardiovascular:     Rate and Rhythm: Normal rate and regular rhythm.  Pulmonary:     Effort: Pulmonary effort is normal. No respiratory distress.     Breath sounds: Wheezing and rhonchi present. No rales.  Abdominal:     Palpations: Abdomen is soft.  Musculoskeletal:        General: Tenderness present.     Cervical back: Normal range of motion.  Lymphadenopathy:     Cervical: No cervical adenopathy.  Skin:    General: Skin is warm and dry.  Neurological:     Mental Status: She is alert and oriented to person, place, and time.     Vitals:   03/08/23 1509  BP: 124/80  Pulse: 92  Temp: 98.7 F (37.1 C)  TempSrc:  Oral  SpO2: 97%  Weight: 173 lb (78.5 kg)  Height: 4' 11.5" (1.511 m)    Assessment & Plan:  Depo-medrol 40 mg IM given at visit

## 2023-03-09 NOTE — Assessment & Plan Note (Signed)
POC flu, covid-19 and RSV testing done. Flu A positive. She is outside window for antiviral. Given depo-medrol 40 mg IM for SOB and rx tessalon perles and promethazine/dm cough syrup. Given timeline for expectations and signs to watch for worsening.

## 2023-03-10 ENCOUNTER — Ambulatory Visit (INDEPENDENT_AMBULATORY_CARE_PROVIDER_SITE_OTHER): Payer: Medicare Other

## 2023-03-10 ENCOUNTER — Encounter: Payer: Self-pay | Admitting: Internal Medicine

## 2023-03-10 ENCOUNTER — Ambulatory Visit: Payer: Self-pay | Admitting: Internal Medicine

## 2023-03-10 ENCOUNTER — Ambulatory Visit: Payer: Medicare Other | Admitting: Internal Medicine

## 2023-03-10 VITALS — BP 136/66 | HR 75 | Temp 98.3°F | Resp 16 | Ht 59.5 in | Wt 175.4 lb

## 2023-03-10 DIAGNOSIS — R052 Subacute cough: Secondary | ICD-10-CM | POA: Diagnosis not present

## 2023-03-10 DIAGNOSIS — J22 Unspecified acute lower respiratory infection: Secondary | ICD-10-CM

## 2023-03-10 DIAGNOSIS — R509 Fever, unspecified: Secondary | ICD-10-CM | POA: Diagnosis not present

## 2023-03-10 DIAGNOSIS — R590 Localized enlarged lymph nodes: Secondary | ICD-10-CM | POA: Diagnosis not present

## 2023-03-10 DIAGNOSIS — R059 Cough, unspecified: Secondary | ICD-10-CM | POA: Diagnosis not present

## 2023-03-10 LAB — CBC WITH DIFFERENTIAL/PLATELET
Basophils Absolute: 0 10*3/uL (ref 0.0–0.1)
Basophils Relative: 0.7 % (ref 0.0–3.0)
Eosinophils Absolute: 0 10*3/uL (ref 0.0–0.7)
Eosinophils Relative: 0.7 % (ref 0.0–5.0)
HCT: 43.3 % (ref 36.0–46.0)
Hemoglobin: 14.5 g/dL (ref 12.0–15.0)
Lymphocytes Relative: 50.1 % — ABNORMAL HIGH (ref 12.0–46.0)
Lymphs Abs: 1.6 10*3/uL (ref 0.7–4.0)
MCHC: 33.5 g/dL (ref 30.0–36.0)
MCV: 98.1 fL (ref 78.0–100.0)
Monocytes Absolute: 0.5 10*3/uL (ref 0.1–1.0)
Monocytes Relative: 14.9 % — ABNORMAL HIGH (ref 3.0–12.0)
Neutro Abs: 1.1 10*3/uL — ABNORMAL LOW (ref 1.4–7.7)
Neutrophils Relative %: 33.6 % — ABNORMAL LOW (ref 43.0–77.0)
Platelets: 191 10*3/uL (ref 150.0–400.0)
RBC: 4.41 Mil/uL (ref 3.87–5.11)
RDW: 13.8 % (ref 11.5–15.5)
WBC: 3.2 10*3/uL — ABNORMAL LOW (ref 4.0–10.5)

## 2023-03-10 LAB — C-REACTIVE PROTEIN: CRP: <1 mg/dL (ref 0.5–20.0)

## 2023-03-10 MED ORDER — OXYCODONE HCL 5 MG PO TABS: 5.0000 mg | ORAL_TABLET | Freq: Four times a day (QID) | ORAL | 0 refills | Status: AC | PRN

## 2023-03-10 MED ORDER — AMOXICILLIN-POT CLAVULANATE 875-125 MG PO TABS: 1.0000 | ORAL_TABLET | Freq: Two times a day (BID) | ORAL | 0 refills | Status: AC

## 2023-03-10 NOTE — Patient Instructions (Signed)
Lymphangitis, Adult  Lymphangitis is inflammation of one or more lymph vessels. It is often caused by an infection with bacteria. Lymph vessels are part of the lymphatic system. This system is part of the body's defense system (immune system). It is a network of vessels, glands, and organs that carry a fluid called lymph around your body. Lymph vessels drain into glands called lymph nodes. These nodes take bacteria, viruses, and waste products out of the lymph to keep them from spreading through the body. Lymphangitis causes red streaks and swelling. It can also make your skin sore. Prompt treatment is needed to stop it from getting worse. If not treated, it can spread through your lymph system and into your blood (bacteremia). What are the causes? In most cases, this condition is caused by an infection of the skin. Bacteria may enter your body when your skin is injured. This injury may be from a cut, scratch, or insect bite. It may also be from an incision made during surgery. What increases the risk? You may be more likely to get this condition if: You are female. Males are more likely to get this condition from a skin infection called cellulitis. You have a weak immune system. You take medicines that weaken (suppress) your immune system. You have diabetes. You have chickenpox. You are weak from another illness. What are the signs or symptoms? The most common symptom of this condition is a wound or skin infection that causes red streaks in the skin. These red streaks are the infected lymph vessels. They extend toward the lymph nodes. They may be warm and tender. Other symptoms may include: Throbbing pain. Swollen and tender lymph nodes. Fever or chills. Loss of appetite. Muscle aches. Fast pulse. How is this diagnosed? This condition may be diagnosed based on your symptoms and a physical exam. You may also have tests, such as: Blood tests and cultures. These may be done to find out what  bacteria caused the infection. Culture tests on some pus taken from the infected wound or swollen gland. X-rays. These may be needed if you have a red or swollen joint. You may also need to see an expert in dealing with bone problems. How is this treated? Treatment for this condition may include: Antibiotics. These may be given through an IV. Pain medicine. Incision and drainage. This is a procedure to drain pus from a wound or lymph gland. Follow these instructions at home: Activity Rest as told by your health care provider. Raise (elevate) the affected area above the level of your heart while you are sitting or lying down. Return to your normal activities as told by your provider. Ask your provider what activities are safe for you. General instructions Drink enough fluid to keep your pee (urine) pale yellow. Take over-the-counter and prescription medicines as told by your provider. Finish your antibiotics even if you start to feel better. Follow instructions from your provider about how to take care of any wound. Keep all follow-up visits. Your provider will watch you closely to make sure treatment is working. Contact a health care provider if: You have chills or a fever. Your symptoms do not go away or get worse with treatment. Your symptoms come back after treatment. You have a headache or a stiff neck. Get help right away if: You have chest pain. You have trouble breathing. These symptoms may be an emergency. Get help right away. Call 911. Do not wait to see if the symptoms will go away. Do not  drive yourself to the hospital. This information is not intended to replace advice given to you by your health care provider. Make sure you discuss any questions you have with your health care provider. Document Revised: 10/01/2021 Document Reviewed: 10/01/2021 Elsevier Patient Education  2024 [employer].

## 2023-03-10 NOTE — Progress Notes (Unsigned)
Subjective:  Patient ID: [name], female    DOB: Nov 03, 1950  Age: 73 y.o. MRN: [id number]  CC: Cough   HPI [name] presents for f/up ----  Discussed the use of AI scribe software for clinical note transcription with the patient, who gave verbal consent to proceed.  History of Present Illness   [place] [name] is a 73 year old female who presents with persistent flu-like symptoms and shortness of breath. She was referred by Dr. Okey Dupre for evaluation of persistent symptoms despite treatment.  She has been experiencing flu-like symptoms and shortness of breath since last Thursday or Friday. She received a steroid shot and cough pills on Monday, but her symptoms persist.  She reports tenderness in a lymph node with pain radiating to her neck and ear. She had a fever of 100.53F last night. She describes a slight sore throat, stating 'just not really, maybe a tiny bit, but it's mostly there,' referring to the lymph node.  She experiences some nausea without vomiting. No hemoptysis, but she reports clear phlegm. She is currently taking 'clear pills' and cough syrup for her symptoms.       Outpatient Medications Prior to Visit  Medication Sig Dispense Refill   acetaminophen (TYLENOL) 500 MG tablet Take 1,000 mg by mouth 3 (three) times daily.     aspirin EC 81 MG tablet Take 1 tablet (81 mg total) by mouth daily. Swallow whole. 90 tablet 1   benzonatate (TESSALON) 200 MG capsule Take 1 capsule (200 mg total) by mouth 3 (three) times daily as needed. 60 capsule 0   Blood Glucose Monitoring Suppl (FREESTYLE LITE) w/Device KIT USE TWO TIMES A DAY TO TEST GLUCOSE CONTROL 1 kit 0   Cholecalciferol (VITAMIN D3) 50 MCG (2000 UT) capsule Take 1 capsule (2,000 Units total) by mouth daily. 90 capsule 1   dapagliflozin propanediol (FARXIGA) 10 MG TABS tablet Take 1 tablet (10 mg total) by mouth daily before breakfast. 90 tablet 3   diphenhydrAMINE-zinc acetate (BENADRYL EXTRA  STRENGTH) cream Apply 1 Application topically 3 (three) times daily as needed for itching. 28.4 g 0   fluocinonide-emollient (LIDEX-E) 0.05 % cream Apply 1 Application topically 2 (two) times daily. 60 g 1   FREESTYLE LITE test strip USE 1 STRIP TO TEST TWICE A DAY 50 strip 0   GEMTESA 75 MG TABS Take 75 mg by mouth daily.     Insulin Pen Needle 32G X 6 MM MISC 1 Act by Does not apply route once a week. 30 each 0   levocetirizine (XYZAL) 5 MG tablet Take 1 tablet (5 mg total) by mouth every evening. 90 tablet 1   nitroGLYCERIN (NITROSTAT) 0.4 MG SL tablet Place 1 tablet (0.4 mg total) under the tongue every 5 (five) minutes as needed for chest pain. 25 tablet 3   olmesartan (BENICAR) 20 MG tablet TAKE 1 TABLET BY MOUTH DAILY 90 tablet 0   pantoprazole (PROTONIX) 40 MG tablet Take 1 tablet (40 mg total) by mouth daily. 90 tablet 0   promethazine-dextromethorphan (PROMETHAZINE-DM) 6.25-15 MG/5ML syrup Take 5 mLs by mouth 4 (four) times daily as needed. 118 mL 0   rOPINIRole (REQUIP) 0.5 MG tablet TAKE ONE TO TWO TABLETS BY MOUTH EVERY DAY AT NOON AND AT 5PM THEN TAKE 3 TABLETS AT BEDTIME 360 tablet 0   SYNTHROID 112 MCG tablet TAKE 1 TABLET DAILY WITH BREAKFAST FOR HYPOTHYROIDISM (ANNUAL APPOINTMENT DUE IN JUNE MUST SEE PROVIDER FOR FUTURE REFILLS) 90 tablet 0  No facility-administered medications prior to visit.    ROS Review of Systems  Constitutional:  Positive for chills and fatigue.  HENT:  Positive for sore throat. Negative for trouble swallowing.   Respiratory:  Positive for cough and shortness of breath. Negative for chest tightness and wheezing.   Cardiovascular:  Negative for chest pain, palpitations and leg swelling.  Gastrointestinal:  Positive for nausea. Negative for abdominal pain, diarrhea and vomiting.  Genitourinary: Negative.  Negative for difficulty urinating.  Musculoskeletal: Negative.  Negative for arthralgias and myalgias.  Skin: Negative.  Negative for color change  and rash.  Neurological:  Negative for dizziness, weakness and headaches.  Hematological:  Positive for adenopathy. Does not bruise/bleed easily.    Objective:  BP 136/66 (BP Location: Left Arm, Patient Position: Sitting, Cuff Size: Normal)   Pulse 75   Temp 98.3 F (36.8 C) (Oral)   Resp 16   Ht 4' 11.5" (1.511 m)   Wt 175 lb 6.4 oz (79.6 kg)   SpO2 94%   BMI 34.83 kg/m   BP Readings from Last 3 Encounters:  03/10/23 136/66  03/08/23 124/80  01/11/23 126/68    Wt Readings from Last 3 Encounters:  03/10/23 175 lb 6.4 oz (79.6 kg)  03/08/23 173 lb (78.5 kg)  01/11/23 171 lb 12.8 oz (77.9 kg)    Physical Exam Vitals reviewed.  Constitutional:      General: She is not in acute distress.    Appearance: Normal appearance. She is ill-appearing. She is not toxic-appearing or diaphoretic.  HENT:     Mouth/Throat:     Mouth: Mucous membranes are moist.  Eyes:     General: No scleral icterus.    Conjunctiva/sclera: Conjunctivae normal.  Cardiovascular:     Rate and Rhythm: Normal rate and regular rhythm.     Heart sounds: No murmur heard.    No friction rub. No gallop.  Pulmonary:     Effort: Pulmonary effort is normal.     Breath sounds: No stridor. No wheezing, rhonchi or rales.  Abdominal:     General: Abdomen is flat.     Palpations: There is no mass.     Tenderness: There is no abdominal tenderness. There is no guarding.     Hernia: No hernia is present.  Musculoskeletal:        General: Normal range of motion.     Cervical back: Normal range of motion and neck supple.     Right lower leg: No edema.     Left lower leg: No edema.  Lymphadenopathy:     Cervical: Cervical adenopathy present.     Right cervical: No superficial, deep or posterior cervical adenopathy.    Left cervical: Superficial cervical adenopathy present. No deep or posterior cervical adenopathy.  Skin:    General: Skin is warm and dry.     Findings: No rash.  Neurological:     General: No  focal deficit present.     Mental Status: She is alert. Mental status is at baseline.  Psychiatric:        Mood and Affect: Mood normal.        Behavior: Behavior normal.     Lab Results  Component Value Date   WBC 3.2 (L) 03/10/2023   HGB 14.5 03/10/2023   HCT 43.3 03/10/2023   PLT 191.0 03/10/2023   GLUCOSE 86 10/06/2022   CHOL 249 (H) 01/11/2023   TRIG 136.0 01/11/2023   HDL 48.50 01/11/2023   LDLDIRECT 142.0 01/28/2016  LDLCALC 173 (H) 01/11/2023   ALT 26 10/06/2022   AST 19 10/06/2022   NA 136 10/06/2022   K 4.0 10/06/2022   CL 101 10/06/2022   CREATININE 0.86 10/06/2022   BUN 15 10/06/2022   CO2 25 10/06/2022   TSH 5.20 01/11/2023   INR 1.12 03/17/2017   HGBA1C 6.6 (H) 01/11/2023   MICROALBUR <0.7 06/24/2022    DG Bone Density Result Date: 03/04/2023 Table formatting from the original result was not included. Date of study: 03/04/2023 Exam: DUAL X-RAY ABSORPTIOMETRY (DXA) FOR BONE MINERAL DENSITY (BMD) Instrument: [employer] Requesting Provider: PCP Indication: follow up for low BMD Comparison: none (please note that it is not possible to compare data from different instruments) Clinical data: Pt is a 73 y.o. female with previous history of fracture. Results:  Lumbar spine L1-L4 Femoral neck (FN) T-score -0.4 RFN: -1.7 LFN: -1.7 Assessment: By the [facility] Criteria for diagnosis based on bone density, this patient has Low Bone Density FRAX 10-year fracture risk calculator: 16.5 % for any major fracture and 2.9 % for hip fracture. Pharmacologic therapy is recommended if 10 year fracture risk is >20% for any major osteoporotic fracture or >3% for hip fracture.  Comments: the technical quality of the study is good. WHO criteria for diagnosis of osteoporosis in postmenopausal women and in men 67 y/o or older: - normal: T-score -1.0 to + 1.0 - osteopenia/low bone density: T-score between -2.5 and -1.0 - osteoporosis: T-score below -2.5 - severe osteoporosis: T-score below -2.5  with history of fragility fracture Note: although not part of the WHO classification, the presence of a fragility fracture, regardless of the T-score, should be considered diagnostic of osteoporosis, provided other causes for the fracture have been excluded. RECOMMENDATION: 1. All patients should optimize calcium and vitamin D intake. 2. Consider FDA-approved medical therapies in postmenopausal women and men aged 25 years and older, based on the following: a. A hip or vertebral(clinical or morphometric) fracture. b. T-Score of  -2.5 or less at the femoral neck , total hip or spine after appropriate evaluation to exclude secondary causes c. Low bone mass (T-score between -1.0 and -2.5 at the femoral neck or spine) and a 10 year probability of a hip fracture >3% or a 10 year probability of major osteoporosis-related fracture > 20% based on the [place]-adapted WHO algorithm d. Clinical judgement and/or patient preferences may indicate treatment for people with 10-year fracture probabilities above or below these levels Follow up BMD is recommended: 2 years Interpreted by : Lyndle Herrlich, MD [facility]   DG Chest 2 View Result Date: 03/10/2023 CLINICAL DATA:  Fever and cough. EXAM: CHEST - 2 VIEW COMPARISON:  Chest radiograph dated 10/06/2022. FINDINGS: No focal consolidation, pleural effusion, or pneumothorax. The cardiac silhouette is within normal limits. No acute osseous pathology. IMPRESSION: No active cardiopulmonary disease. Electronically Signed   By: Elgie Collard M.D.   On: 03/10/2023 15:29     Assessment & Plan:   Left cervical lymphadenopathy -     CBC with Differential/Platelet; Future -     C-reactive protein; Future -     oxyCODONE HCl; Take 1 tablet (5 mg total) by mouth every 6 (six) hours as needed for up to 8 days for severe pain (pain score 7-10).  Dispense: 30 tablet; Refill: 0  Subacute cough- CXR is negative for infiltrate. -     CBC with Differential/Platelet;  Future -     DG Chest 2 View; Future  LRTI (lower  respiratory tract infection) -     Amoxicillin-Pot Clavulanate; Take 1 tablet by mouth 2 (two) times daily for 10 days.  Dispense: 20 tablet; Refill: 0     Follow-up: Return in about 3 weeks (around 03/31/2023).  Sanda Linger, MD

## 2023-03-10 NOTE — Telephone Encounter (Signed)
Copied from CRM [id number]. Topic: Clinical - Red Word Triage >> Mar 10, 2023  8:49 AM Kathryne Eriksson wrote: Red Word that prompted transfer to Nurse Triage: Temp 100.9 >> Mar 10, 2023  8:52 AM Kathryne Eriksson wrote: Patients states she was just seen in the office Monday and was diagnosed with the flu, states she's not experiencing any new or worsening symptoms from the flu but have some concerns with this knot that appeared on her neck (states it's kind of puffy).    Chief Complaint: Knot, Left Jawline Symptoms: Swelling, Redness Frequency: Acute Pertinent Negatives: Patient denies Dyspnea, Chest Pain Disposition: [] ED /[] Urgent Care (no appt availability in office) / [x] Appointment(In office/virtual)/ []  [place] [facility]/ [] Home Care/ [] Refused Recommended Disposition /[] [facility]/ []  Follow-up with PCP Additional Notes: Patient contacted via phone regarding concerns of Acute swelling along the left jaw line. Discussed symptoms, severity, and duration. Based on assessment, patient was advised to see PCP in office. Patient verbalized understanding and agreement with plan. Documentation provided.     Reason for Disposition  [1] Swelling is painful to touch AND [2] fever  Answer Assessment - Initial Assessment Questions 1. APPEARANCE of SWELLING: "What does it look like?"     Noticeable, patient unable to describe  2. SIZE: "How large is the swelling?" (e.g., inches, cm; or compare to size of pinhead, tip of pen, eraser, coin, pea, grape, ping pong ball)      Unsure  3. LOCATION: "Where is the swelling located?"     Left Jawline 4. ONSET: "When did the swelling start?"     Yesterday  5. COLOR: "What color is it?" "Is there more than one color?"     Red  6. PAIN: "Is there any pain?" If Yes, ask: "How bad is the pain?" (e.g., scale 1-10; or mild, moderate, severe)     - NONE (0): no pain   - MILD (1-3): doesn't interfere with normal activities    - MODERATE (4-7):  interferes with normal activities or awakens from sleep    - SEVERE (8-10): excruciating pain, unable to do any normal activities     Mild  7. ITCH: "Does it itch?" If Yes, ask: "How bad is the itch?"      No  8. CAUSE: "What do you think caused the swelling?" 9 OTHER SYMPTOMS: "Do you have any other symptoms?" (e.g., fever)     Cough, Fever 100.9  Protocols used: Skin Lump or Localized Swelling-A-AH

## 2023-03-11 ENCOUNTER — Encounter: Payer: Self-pay | Admitting: Internal Medicine

## 2023-03-24 ENCOUNTER — Other Ambulatory Visit: Payer: Self-pay | Admitting: Internal Medicine

## 2023-03-24 DIAGNOSIS — E559 Vitamin D deficiency, unspecified: Secondary | ICD-10-CM

## 2023-03-24 DIAGNOSIS — M8580 Other specified disorders of bone density and structure, unspecified site: Secondary | ICD-10-CM

## 2023-03-26 ENCOUNTER — Other Ambulatory Visit: Payer: Self-pay | Admitting: Internal Medicine

## 2023-03-26 DIAGNOSIS — G2581 Restless legs syndrome: Secondary | ICD-10-CM

## 2023-03-30 ENCOUNTER — Ambulatory Visit: Payer: Self-pay | Admitting: Internal Medicine

## 2023-03-30 NOTE — Telephone Encounter (Signed)
 Summary: Lingering Cough/Runny Nose   Copied From CRM [id number]. Reason for Triage: Patient seen 2 weeks ago and had the flu, she still has a bad cough, runny nose and wants to be seen - says she tested negative for pnuemonia           Chief Complaint: Productive cough Symptoms: productive cough, scratchy throat  Frequency: 3-4 weeks Pertinent Negatives: Patient denies fever, recent travel outside the country, ear aches Disposition: [] ED /[] Urgent Care (no appt availability in office) / [] Appointment(In office/virtual)/ []  [place] [facility]/ [] Home Care/ [x] Refused Recommended Disposition /[] [place] [facility]/ []  Follow-up with PCP Additional Notes: Patient called and advised that 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 Patient states that she would like to be fit in today if possible.  Patient continually having coughing episodes while on the phone with this RN during triage.  Patient states that she still has a productive cough with yellowish phlegm, a scratchy throat, and a runny nose. She advises some shortness of breath on exertion.  Patient states this all has been going on for 3-4 weeks now and not getting better. She denies any fever, recent travel outside the country, ear aches. She is advised that the recommendation is for her to be seen in the next 4 hours by a provider.  No availability in the office at this time and patient has been seeing her PCP for these symptoms and they seem to be getting worse.  No other provider at patient's PCP office has an opening today.  Patient is advised that urgent care and the ER are options as well.  Patient states that she doesn't want to go to to the ER because she states that is where she became sick in the first place weeks ago.  She is advised that if she becomes worse to go to the Emergency Room and she verbalized understanding of that.  Patient is hoping  that her PCP can see her today.   Reason for Disposition  Continuous (nonstop) coughing interferes with work, school, or sleeping  Answer Assessment - Initial Assessment Questions 1. ONSET: "When did the cough begin?"      3-4 weeks 2. SEVERITY: "How bad is the cough today?"      continuous 3. SPUTUM: "Describe the color of your sputum" (none, dry cough; clear, white, yellow, green)     yellowish 4. HEMOPTYSIS: "Are you coughing up any blood?" If so ask: "How much?" (flecks, streaks, tablespoons, etc.)     No 5. DIFFICULTY BREATHING: "Are you having difficulty breathing?" If Yes, ask: "How bad is it?" (e.g., mild, moderate, severe)    - MILD: No SOB at rest, mild SOB with walking, speaks normally in sentences, can lie down, no retractions, pulse < 100.    - MODERATE: SOB at rest, SOB with minimal exertion and prefers to sit, cannot lie down flat, speaks in phrases, mild retractions, audible wheezing, pulse 100-120.    - SEVERE: Very SOB at rest, speaks in single words, struggling to breathe, sitting hunched forward, retractions, pulse > 120      "Tiny bit short of breath" 6. FEVER: "Do you have a fever?" If Yes, ask: "What is your temperature, how was it measured, and when did it start?"     No 7. CARDIAC HISTORY: "Do you have any history of heart disease?" (e.g., heart attack, congestive heart failure)  No 8. LUNG HISTORY: "Do you have any history of lung disease?"  (e.g., pulmonary embolus, asthma, emphysema)     Asthma 9. PE RISK FACTORS: "Do you have a history of blood clots?" (or: recent major surgery, recent prolonged travel, bedridden)     No 10. OTHER SYMPTOMS: "Do you have any other symptoms?" (e.g., runny nose, wheezing, chest pain)       Runny nose, short of breath on exertion 12. TRAVEL: "Have you traveled out of the country in the last month?" (e.g., travel history, exposures)       No  Answer Assessment - Initial Assessment Questions 1. RESPIRATORY STATUS:  "Describe your breathing?" (e.g., wheezing, shortness of breath, unable to speak, severe coughing)      Short of breath on exertion with coughing 2. ONSET: "When did this breathing problem begin?"      3-4 weeks ago 3. PATTERN "Does the difficult breathing come and go, or has it been constant since it started?"      Worse with a lot of movement 4. SEVERITY: "How bad is your breathing?" (e.g., mild, moderate, severe)    - MILD: No SOB at rest, mild SOB with walking, speaks normally in sentences, can lie down, no retractions, pulse < 100.    - MODERATE: SOB at rest, SOB with minimal exertion and prefers to sit, cannot lie down flat, speaks in phrases, mild retractions, audible wheezing, pulse 100-120.    - SEVERE: Very SOB at rest, speaks in single words, struggling to breathe, sitting hunched forward, retractions, pulse > 120      Short of breath on exertion 5. RECURRENT SYMPTOM: "Have you had difficulty breathing before?" If Yes, ask: "When was the last time?" and "What happened that time?"      Not this bad 6. CARDIAC HISTORY: "Do you have any history of heart disease?" (e.g., heart attack, angina, bypass surgery, angioplasty)      No 7. LUNG HISTORY: "Do you have any history of lung disease?"  (e.g., pulmonary embolus, asthma, emphysema)     Asthma and "touch of COPD" 8. CAUSE: "What do you think is causing the breathing problem?"      Recent sickness wont get better 9. OTHER SYMPTOMS: "Do you have any other symptoms? (e.g., dizziness, runny nose, cough, chest pain, fever)     Runny nose, productive cough with yellow phlegm 10. O2 SATURATION MONITOR:  "Do you use an oxygen saturation monitor (pulse oximeter) at home?" If Yes, ask: "What is your reading (oxygen level) today?" "What is your usual oxygen saturation reading?" (e.g., 95%)       N/A 12. TRAVEL: "Have you traveled out of the country in the last month?" (e.g., travel history, exposures)       No  Protocols used: Cough - Acute  Productive-A-AH, Breathing Difficulty-A-AH

## 2023-04-01 ENCOUNTER — Encounter: Payer: Self-pay | Admitting: Internal Medicine

## 2023-04-01 ENCOUNTER — Ambulatory Visit: Admitting: Internal Medicine

## 2023-04-01 ENCOUNTER — Ambulatory Visit: Payer: Self-pay | Admitting: Family

## 2023-04-01 ENCOUNTER — Ambulatory Visit: Payer: Self-pay | Admitting: Internal Medicine

## 2023-04-01 ENCOUNTER — Ambulatory Visit

## 2023-04-01 VITALS — BP 146/82 | HR 88 | Temp 99.1°F | Resp 16 | Ht 59.5 in | Wt 175.8 lb

## 2023-04-01 DIAGNOSIS — R052 Subacute cough: Secondary | ICD-10-CM

## 2023-04-01 DIAGNOSIS — R509 Fever, unspecified: Secondary | ICD-10-CM

## 2023-04-01 DIAGNOSIS — R059 Cough, unspecified: Secondary | ICD-10-CM | POA: Diagnosis not present

## 2023-04-01 DIAGNOSIS — R079 Chest pain, unspecified: Secondary | ICD-10-CM | POA: Diagnosis not present

## 2023-04-01 DIAGNOSIS — I739 Peripheral vascular disease, unspecified: Secondary | ICD-10-CM | POA: Diagnosis not present

## 2023-04-01 DIAGNOSIS — J111 Influenza due to unidentified influenza virus with other respiratory manifestations: Secondary | ICD-10-CM | POA: Diagnosis not present

## 2023-04-01 DIAGNOSIS — J22 Unspecified acute lower respiratory infection: Secondary | ICD-10-CM

## 2023-04-01 DIAGNOSIS — J189 Pneumonia, unspecified organism: Secondary | ICD-10-CM | POA: Insufficient documentation

## 2023-04-01 DIAGNOSIS — R918 Other nonspecific abnormal finding of lung field: Secondary | ICD-10-CM | POA: Diagnosis not present

## 2023-04-01 MED ORDER — ASPIRIN 81 MG PO TBEC: 81.0000 mg | DELAYED_RELEASE_TABLET | Freq: Every day | ORAL | Status: AC

## 2023-04-01 MED ORDER — HYDROCODONE BIT-HOMATROP MBR 5-1.5 MG/5ML PO SOLN
5.0000 mL | Freq: Three times a day (TID) | ORAL | 0 refills | Status: DC | PRN
Start: 2023-04-01 — End: 2023-09-22

## 2023-04-01 MED ORDER — AMOXICILLIN-POT CLAVULANATE 875-125 MG PO TABS: 1.0000 | ORAL_TABLET | Freq: Two times a day (BID) | ORAL | 0 refills | Status: DC

## 2023-04-01 NOTE — Telephone Encounter (Signed)
 PLEASE ADVISE.

## 2023-04-01 NOTE — Patient Instructions (Signed)

## 2023-04-01 NOTE — Progress Notes (Signed)
 Subjective:  Patient ID: [name], female    DOB: April 11, 1950  Age: 73 y.o. MRN: [id number]  CC: Cough and Hypertension   HPI Helen presents for f/up ---  Discussed the use of AI scribe software for clinical note transcription with the patient, who gave verbal consent to proceed.  History of Present Illness   [name] is a 73 year old female who presents with worsening cough and shortness of breath.  She has been experiencing a worsening cough and shortness of breath for the past three days. The cough is productive of a small amount of phlegm. She was treated for the flu approximately two weeks ago, but the cough has persisted since then. She is currently using cough suppressants, including 'pearl things,' cough syrup, and cough drops, but these have not been effective.  She experienced chills last night despite having the heat set to 73 degrees and using two blankets. Her temperature was 100.51F before the visit, although it was recorded as 99.30F at the clinic.  She describes feeling 'very short of breath' and experiences dizziness, which she describes as feeling 'swimmy headed.' This dizziness occurred last night and again when she stood up earlier today.  She reports urinary incontinence when coughing, stating, 'when I cough, I pee on myself.' A previously swollen lymph node has resolved.       Outpatient Medications Prior to Visit  Medication Sig Dispense Refill   acetaminophen (TYLENOL) 500 MG tablet Take 1,000 mg by mouth 3 (three) times daily.     Blood Glucose Monitoring Suppl (FREESTYLE LITE) w/Device KIT USE TWO TIMES A DAY TO TEST GLUCOSE CONTROL 1 kit 0   Cholecalciferol (VITAMIN D3) 50 MCG (2000 UT) capsule TAKE 1 CAPSULE BY MOUTH DAILY 90 capsule 0   dapagliflozin propanediol (FARXIGA) 10 MG TABS tablet Take 1 tablet (10 mg total) by mouth daily before breakfast. 90 tablet 3   fluocinonide-emollient (LIDEX-E) 0.05 % cream Apply 1 Application  topically 2 (two) times daily. 60 g 1   FREESTYLE LITE test strip USE 1 STRIP TO TEST TWICE A DAY 50 strip 0   GEMTESA 75 MG TABS Take 75 mg by mouth daily.     Insulin Pen Needle 32G X 6 MM MISC 1 Act by Does not apply route once a week. 30 each 0   levocetirizine (XYZAL) 5 MG tablet Take 1 tablet (5 mg total) by mouth every evening. 90 tablet 1   nitroGLYCERIN (NITROSTAT) 0.4 MG SL tablet Place 1 tablet (0.4 mg total) under the tongue every 5 (five) minutes as needed for chest pain. 25 tablet 3   olmesartan (BENICAR) 20 MG tablet TAKE 1 TABLET BY MOUTH DAILY 90 tablet 0   pantoprazole (PROTONIX) 40 MG tablet Take 1 tablet (40 mg total) by mouth daily. 90 tablet 0   rOPINIRole (REQUIP) 0.5 MG tablet TAKE 1 TO 2 TABLETS BY MOUTH DAILY AT NOON AND 5PM THEN TAKE 3 TABLETS AT BEDTIME 360 tablet 0   SYNTHROID 112 MCG tablet TAKE 1 TABLET DAILY WITH BREAKFAST FOR HYPOTHYROIDISM (ANNUAL APPOINTMENT DUE IN JUNE MUST SEE PROVIDER FOR FUTURE REFILLS) 90 tablet 0   aspirin EC 81 MG tablet Take 1 tablet (81 mg total) by mouth daily. Swallow whole. 90 tablet 1   benzonatate (TESSALON) 200 MG capsule Take 1 capsule (200 mg total) by mouth 3 (three) times daily as needed. 60 capsule 0   diphenhydrAMINE-zinc acetate (BENADRYL EXTRA STRENGTH) cream Apply 1 Application topically 3 (three) times  daily as needed for itching. 28.4 g 0   promethazine-dextromethorphan (PROMETHAZINE-DM) 6.25-15 MG/5ML syrup Take 5 mLs by mouth 4 (four) times daily as needed. 118 mL 0   No facility-administered medications prior to visit.    ROS Review of Systems  Constitutional:  Positive for fever.  HENT: Negative.  Negative for trouble swallowing.   Respiratory:  Positive for cough and shortness of breath. Negative for choking, chest tightness and wheezing.   Cardiovascular:  Negative for chest pain, palpitations and leg swelling.  Gastrointestinal: Negative.  Negative for abdominal pain, constipation, diarrhea, nausea and  vomiting.  Genitourinary: Negative.  Negative for difficulty urinating.  Musculoskeletal: Negative.  Negative for arthralgias and myalgias.  Skin: Negative.   Neurological: Negative.  Negative for dizziness and weakness.  Hematological:  Negative for adenopathy. Does not bruise/bleed easily.  Psychiatric/Behavioral: Negative.      Objective:  BP (!) 146/82 (BP Location: Left Arm, Patient Position: Sitting, Cuff Size: Large)   Pulse 88   Temp 99.1 F (37.3 C) (Oral)   Resp 16   Ht 4' 11.5" (1.511 m)   Wt 175 lb 12.8 oz (79.7 kg)   SpO2 95%   BMI 34.91 kg/m   BP Readings from Last 3 Encounters:  04/01/23 (!) 146/82  03/10/23 136/66  03/08/23 124/80    Wt Readings from Last 3 Encounters:  04/01/23 175 lb 12.8 oz (79.7 kg)  03/10/23 175 lb 6.4 oz (79.6 kg)  03/08/23 173 lb (78.5 kg)    Physical Exam Vitals reviewed.  Constitutional:      General: She is not in acute distress.    Appearance: She is ill-appearing. She is not toxic-appearing or diaphoretic.  HENT:     Nose: Nose normal.     Mouth/Throat:     Mouth: Mucous membranes are moist.  Eyes:     General: No scleral icterus.    Conjunctiva/sclera: Conjunctivae normal.  Cardiovascular:     Rate and Rhythm: Normal rate and regular rhythm.     Heart sounds: No murmur heard.    No friction rub. No gallop.  Pulmonary:     Effort: Pulmonary effort is normal.     Breath sounds: Examination of the left-middle field reveals rhonchi. Examination of the right-lower field reveals rales. Examination of the left-lower field reveals rhonchi. Rhonchi and rales present. No decreased breath sounds or wheezing.  Abdominal:     General: Abdomen is flat.     Palpations: There is no mass.     Tenderness: There is no abdominal tenderness. There is no guarding.     Hernia: No hernia is present.  Musculoskeletal:        General: Normal range of motion.     Cervical back: Neck supple.     Right lower leg: No edema.     Left lower  leg: No edema.  Lymphadenopathy:     Cervical: No cervical adenopathy.  Skin:    General: Skin is warm and dry.  Neurological:     General: No focal deficit present.     Mental Status: She is alert.  Psychiatric:        Mood and Affect: Mood normal.        Behavior: Behavior normal.     Lab Results  Component Value Date   WBC 3.2 (L) 03/10/2023   HGB 14.5 03/10/2023   HCT 43.3 03/10/2023   PLT 191.0 03/10/2023   GLUCOSE 86 10/06/2022   CHOL 249 (H) 01/11/2023   TRIG  136.0 01/11/2023   HDL 48.50 01/11/2023   LDLDIRECT 142.0 01/28/2016   LDLCALC 173 (H) 01/11/2023   ALT 26 10/06/2022   AST 19 10/06/2022   NA 136 10/06/2022   K 4.0 10/06/2022   CL 101 10/06/2022   CREATININE 0.86 10/06/2022   BUN 15 10/06/2022   CO2 25 10/06/2022   TSH 5.20 01/11/2023   INR 1.12 03/17/2017   HGBA1C 6.6 (H) 01/11/2023   MICROALBUR <0.7 06/24/2022    DG Bone Density Result Date: 03/04/2023 Table formatting from the original result was not included. Date of study: 03/04/2023 Exam: DUAL X-RAY ABSORPTIOMETRY (DXA) FOR BONE MINERAL DENSITY (BMD) Instrument: [employer] Requesting Provider: PCP Indication: follow up for low BMD Comparison: none (please note that it is not possible to compare data from different instruments) Clinical data: Pt is a 73 y.o. female with previous history of fracture. Results:  Lumbar spine L1-L4 Femoral neck (FN) T-score -0.4 RFN: -1.7 LFN: -1.7 Assessment: By the [facility] Criteria for diagnosis based on bone density, this patient has Low Bone Density FRAX 10-year fracture risk calculator: 16.5 % for any major fracture and 2.9 % for hip fracture. Pharmacologic therapy is recommended if 10 year fracture risk is >20% for any major osteoporotic fracture or >3% for hip fracture.  Comments: the technical quality of the study is good. WHO criteria for diagnosis of osteoporosis in postmenopausal women and in men 4 y/o or older: - normal: T-score -1.0 to + 1.0 - osteopenia/low  bone density: T-score between -2.5 and -1.0 - osteoporosis: T-score below -2.5 - severe osteoporosis: T-score below -2.5 with history of fragility fracture Note: although not part of the WHO classification, the presence of a fragility fracture, regardless of the T-score, should be considered diagnostic of osteoporosis, provided other causes for the fracture have been excluded. RECOMMENDATION: 1. All patients should optimize calcium and vitamin D intake. 2. Consider FDA-approved medical therapies in postmenopausal women and men aged 30 years and older, based on the following: a. A hip or vertebral(clinical or morphometric) fracture. b. T-Score of  -2.5 or less at the femoral neck , total hip or spine after appropriate evaluation to exclude secondary causes c. Low bone mass (T-score between -1.0 and -2.5 at the femoral neck or spine) and a 10 year probability of a hip fracture >3% or a 10 year probability of major osteoporosis-related fracture > 20% based on the [place]-adapted WHO algorithm d. Clinical judgement and/or patient preferences may indicate treatment for people with 10-year fracture probabilities above or below these levels Follow up BMD is recommended: 2 years Interpreted by : Lyndle Herrlich, MD [facility]    DG Chest 2 View Result Date: 04/01/2023 CLINICAL DATA:  Cough and fever. Chest congestion. Shortness of breath. Chest pain for the past 3 days. Diagnosed with flu 2 weeks ago. EXAM: CHEST - 2 VIEW COMPARISON:  03/10/2023 FINDINGS: Stable normal sized heart and tortuous aorta. Interval minimal patchy opacity at both lateral lung bases. No pleural fluid seen. Unremarkable bones. IMPRESSION: Interval minimal patchy bibasilar atelectasis or pneumonia. Electronically Signed   By: Beckie Salts M.D.   On: 04/01/2023 16:57     Assessment & Plan:  Subacute cough -     DG Chest 2 View; Future -     HYDROcodone Bit-Homatrop MBr; Take 5 mLs by mouth every 8 (eight) hours as needed for  cough.  Dispense: 120 mL; Refill: 0  Fever, unspecified fever cause -     DG Chest 2  View; Future  PAD (peripheral artery disease) (HCC) -     Aspirin; Take 1 tablet (81 mg total) by mouth daily. Swallow whole.  LRTI (lower respiratory tract infection) -     Amoxicillin-Pot Clavulanate; Take 1 tablet by mouth 2 (two) times daily.  Dispense: 20 tablet; Refill: 0  Pneumonia of both lower lobes due to infectious organism- Will treat with augmentin.     Follow-up: Return in about 3 months (around 07/02/2023).  Sanda Linger, MD

## 2023-04-01 NOTE — Telephone Encounter (Signed)
 Copied from CRM [id number]. Topic: Clinical - Red Word Triage >> Apr 01, 2023  8:12 AM [name] wrote: Red Word that prompted transfer to Nurse Triage: Patient stated she tested positive for the flu Chills, 100.1 fever, short of breath.   Chief Complaint: chills , fever, cough, O2 sat 90 % but comes up to 94% with deep breathing but not sustained.  Symptoms: muscle aches, headache, nasal congestion.  Frequency: 2 weeks Disposition: [] ED /[] Urgent Care (no appt availability in office) / [x] Appointment(In office/virtual)/ []  [place] [facility]/ [] Home Care/ [] Refused Recommended Disposition /[] [place]/ []  Follow-up with PCP Additional Notes: called office and spoke to [facility] to see if can fit pt in. No openings. Made virtual appt with another providerr in practicce tomorrow. Advised pt concern about O2 sats and SOB. Pt stated she will call her daughter and go to ED at [facility]. Pt frustrated about not getting a call back from office on 03/30/23. Pt stated it dididnt have to be with PCP but just someone at the office to speak with. This was discussed with Antionio as well.   Reason for Disposition . Fever present > 3 days (72 hours)  Answer Assessment - Initial Assessment Questions 1. WORST SYMPTOM: "What is your worst symptom?" (e.g., cough, runny nose, muscle aches, headache, sore throat, fever)      Cough, fever, SOB, chills  2. ONSET: "When did your flu symptoms start?"      2 weeks  3. COUGH: "How bad is the cough?"       Keeps her up at night 4. RESPIRATORY DISTRESS: "Describe your breathing."      SOB moderate 5. FEVER: "Do you have a fever?" If Yes, ask: "What is your temperature, how was it measured, and when did it start?"     100.1 6. EXPOSURE: "Were you exposed to someone with influenza?"       yes 8. HIGH RISK DISEASE: "Do you have any chronic medical problems?" (e.g., heart or lung disease, asthma, weak immune system, or other HIGH RISK conditions)      age  23. OTHER SYMPTOMS: "Do you have any other symptoms?"  (e.g., runny nose, muscle aches, headache, sore throat)       Chills and muscle aches, runny nose, prod. Cough light yellow in color headache, scratchy throat, O2 sat 90% but comes up after taking deep breaths  Protocols used: Influenza (Flu) - [facility]

## 2023-04-01 NOTE — Telephone Encounter (Signed)
 Patient has been scheduled

## 2023-04-02 ENCOUNTER — Ambulatory Visit: Admitting: Internal Medicine

## 2023-04-14 DIAGNOSIS — Z1231 Encounter for screening mammogram for malignant neoplasm of breast: Secondary | ICD-10-CM | POA: Diagnosis not present

## 2023-04-14 LAB — HM MAMMOGRAPHY

## 2023-04-15 ENCOUNTER — Encounter: Payer: Self-pay | Admitting: Internal Medicine

## 2023-04-26 ENCOUNTER — Other Ambulatory Visit: Payer: Self-pay | Admitting: Internal Medicine

## 2023-05-12 DIAGNOSIS — E039 Hypothyroidism, unspecified: Secondary | ICD-10-CM | POA: Diagnosis not present

## 2023-05-12 DIAGNOSIS — R197 Diarrhea, unspecified: Secondary | ICD-10-CM | POA: Diagnosis not present

## 2023-05-12 DIAGNOSIS — R1032 Left lower quadrant pain: Secondary | ICD-10-CM | POA: Diagnosis not present

## 2023-05-12 DIAGNOSIS — E119 Type 2 diabetes mellitus without complications: Secondary | ICD-10-CM | POA: Diagnosis not present

## 2023-05-12 DIAGNOSIS — K5730 Diverticulosis of large intestine without perforation or abscess without bleeding: Secondary | ICD-10-CM | POA: Diagnosis not present

## 2023-05-12 DIAGNOSIS — K5792 Diverticulitis of intestine, part unspecified, without perforation or abscess without bleeding: Secondary | ICD-10-CM | POA: Diagnosis not present

## 2023-05-12 DIAGNOSIS — J45909 Unspecified asthma, uncomplicated: Secondary | ICD-10-CM | POA: Diagnosis not present

## 2023-05-12 DIAGNOSIS — Z7984 Long term (current) use of oral hypoglycemic drugs: Secondary | ICD-10-CM | POA: Diagnosis not present

## 2023-05-12 DIAGNOSIS — I10 Essential (primary) hypertension: Secondary | ICD-10-CM | POA: Diagnosis not present

## 2023-05-12 DIAGNOSIS — Z79899 Other long term (current) drug therapy: Secondary | ICD-10-CM | POA: Diagnosis not present

## 2023-05-12 DIAGNOSIS — M1711 Unilateral primary osteoarthritis, right knee: Secondary | ICD-10-CM | POA: Diagnosis not present

## 2023-05-12 DIAGNOSIS — Z860101 Personal history of adenomatous and serrated colon polyps: Secondary | ICD-10-CM | POA: Diagnosis not present

## 2023-05-12 DIAGNOSIS — K219 Gastro-esophageal reflux disease without esophagitis: Secondary | ICD-10-CM | POA: Diagnosis not present

## 2023-05-18 ENCOUNTER — Other Ambulatory Visit: Payer: Self-pay | Admitting: Internal Medicine

## 2023-05-18 DIAGNOSIS — G2581 Restless legs syndrome: Secondary | ICD-10-CM

## 2023-05-26 ENCOUNTER — Ambulatory Visit (INDEPENDENT_AMBULATORY_CARE_PROVIDER_SITE_OTHER)

## 2023-05-26 ENCOUNTER — Encounter: Payer: Self-pay | Admitting: Internal Medicine

## 2023-05-26 ENCOUNTER — Ambulatory Visit (INDEPENDENT_AMBULATORY_CARE_PROVIDER_SITE_OTHER): Admitting: Internal Medicine

## 2023-05-26 DIAGNOSIS — J22 Unspecified acute lower respiratory infection: Secondary | ICD-10-CM

## 2023-05-26 DIAGNOSIS — R0602 Shortness of breath: Secondary | ICD-10-CM | POA: Diagnosis not present

## 2023-05-26 DIAGNOSIS — R059 Cough, unspecified: Secondary | ICD-10-CM | POA: Diagnosis not present

## 2023-05-26 MED ORDER — AMOXICILLIN-POT CLAVULANATE 875-125 MG PO TABS: 1.0000 | ORAL_TABLET | Freq: Two times a day (BID) | ORAL | 0 refills | Status: AC

## 2023-05-26 NOTE — Progress Notes (Signed)
 Subjective:    Patient ID: Helen  Lin, female    DOB: 01/21/1951, 73 y.o.   MRN: [id number]      HPI Rikayla  is here for  Chief Complaint  Patient presents with   Cough    Cough, lots of mucus, hx of pneumonia (short of breath); tiny bit of discomfort (over a week)    Had pneumonia 2 months ago.   For over one week she has had cold symptoms.  She was exposed to someone with pna and is concerned that her pneumonia could be back.  She states significant nasal congestion with discolored mucus, productive cough with green-yellow thick mucus, shortness of breath and headaches.  Her eyes are also itchy, but she is not sure if that is not related to allergies   Taking tylenol .    Medications and allergies reviewed with patient and updated if appropriate.  Current Outpatient Medications on File Prior to Visit  Medication Sig Dispense Refill   acetaminophen  (TYLENOL ) 500 MG tablet Take 1,000 mg by mouth 3 (three) times daily.     amoxicillin -clavulanate (AUGMENTIN ) 875-125 MG tablet Take 1 tablet by mouth 2 (two) times daily. 20 tablet 0   aspirin  EC 81 MG tablet Take 1 tablet (81 mg total) by mouth daily. Swallow whole.     Blood Glucose Monitoring Suppl (FREESTYLE LITE) w/Device KIT USE TWO TIMES A DAY TO TEST GLUCOSE CONTROL 1 kit 0   Cholecalciferol (VITAMIN D3) 50 MCG (2000 UT) capsule TAKE 1 CAPSULE BY MOUTH DAILY 90 capsule 0   dapagliflozin  propanediol (FARXIGA ) 10 MG TABS tablet Take 1 tablet (10 mg total) by mouth daily before breakfast. 90 tablet 3   fluocinonide -emollient (LIDEX -E) 0.05 % cream Apply 1 Application topically 2 (two) times daily. 60 g 1   FREESTYLE LITE test strip USE 1 STRIP TO TEST TWICE A DAY 50 strip 0   GEMTESA 75 MG TABS Take 75 mg by mouth daily.     HYDROcodone  bit-homatropine (HYCODAN) 5-1.5 MG/5ML syrup Take 5 mLs by mouth every 8 (eight) hours as needed for cough. 120 mL 0   Insulin  Pen Needle 32G X 6 MM MISC 1 Act by Does not apply  route once a week. 30 each 0   levocetirizine (XYZAL ) 5 MG tablet Take 1 tablet (5 mg total) by mouth every evening. 90 tablet 1   nitroGLYCERIN  (NITROSTAT ) 0.4 MG SL tablet Place 1 tablet (0.4 mg total) under the tongue every 5 (five) minutes as needed for chest pain. 25 tablet 3   olmesartan  (BENICAR ) 20 MG tablet TAKE 1 TABLET BY MOUTH DAILY 90 tablet 0   pantoprazole  (PROTONIX ) 40 MG tablet TAKE 1 TABLET BY MOUTH DAILY 90 tablet 0   rOPINIRole  (REQUIP ) 0.5 MG tablet TAKE 1 TO 2 TABLETS BY MOUTH DAILY AT NOON AND 5PM THEN TAKE 3 TABLETS AT BEDTIME 360 tablet 0   SYNTHROID  112 MCG tablet TAKE 1 TABLET DAILY WITH BREAKFAST FOR HYPOTHYROIDISM (ANNUAL APPOINTMENT DUE IN JUNE MUST SEE PROVIDER FOR FUTURE REFILLS) 90 tablet 0   No current facility-administered medications on file prior to visit.    Review of Systems  Constitutional:  Negative for appetite change and fever.  HENT:  Positive for congestion (discolored). Negative for ear pain, sinus pain and sore throat.   Eyes:  Positive for itching.  Respiratory:  Positive for cough (productive - greenish -yellow, thick) and shortness of breath. Negative for chest tightness and wheezing.   Neurological:  Positive for headaches. Negative  for light-headedness.       Objective:   Vitals:   05/26/23 1459  BP: (!) 142/80  Pulse: 67  Temp: 98 F (36.7 C)  SpO2: 96%   BP Readings from Last 3 Encounters:  05/26/23 (!) 142/80  04/01/23 (!) 146/82  03/10/23 136/66   Wt Readings from Last 3 Encounters:  05/26/23 180 lb (81.6 kg)  04/01/23 175 lb 12.8 oz (79.7 kg)  03/10/23 175 lb 6.4 oz (79.6 kg)   Body mass index is 35.75 kg/m.    Physical Exam Constitutional:      General: She is not in acute distress.    Appearance: Normal appearance. She is not ill-appearing.  HENT:     Head: Normocephalic and atraumatic.     Right Ear: Tympanic membrane, ear canal and external ear normal.     Left Ear: Tympanic membrane, ear canal and  external ear normal.     Mouth/Throat:     Mouth: Mucous membranes are moist.     Pharynx: No oropharyngeal exudate or posterior oropharyngeal erythema.  Eyes:     Conjunctiva/sclera: Conjunctivae normal.  Cardiovascular:     Rate and Rhythm: Normal rate and regular rhythm.  Pulmonary:     Effort: Pulmonary effort is normal. No respiratory distress.     Breath sounds: Rales (Mild bibasilar Rales) present. No wheezing.  Musculoskeletal:     Cervical back: Neck supple. No tenderness.  Lymphadenopathy:     Cervical: No cervical adenopathy.  Skin:    General: Skin is warm and dry.  Neurological:     Mental Status: She is alert.        DG Chest 2 View CLINICAL DATA:  Cough and shortness of breath for 2 weeks.  EXAM: CHEST - 2 VIEW  COMPARISON:  April 01, 2023.  FINDINGS: The heart size and mediastinal contours are within normal limits. Both lungs are clear. The visualized skeletal structures are unremarkable.  IMPRESSION: No active cardiopulmonary disease.  Electronically Signed   By: Rosalene Colon M.D.   On: 05/26/2023 15:27      Assessment & Plan:    Lower respiratory tract infection: Acute Symptoms that started over a week ago Had pneumonia 2 months ago which did clear up successfully Having symptoms consistent with lower respiratory tract infection-does have some bibasilar rales on exam which could be atelectasis versus pneumonia Chest x-ray today negative for pneumonia Start Augmentin  875-125 mg twice daily x 10 days Has Hycodan cough syrup at home that she can take as needed Advised over-the-counter cold medications as needed Rest, fluids Call if no improvement

## 2023-05-26 NOTE — Patient Instructions (Addendum)
      Have a chest xray downstairs     Medications changes include :   Augmentin  x 10 days      Return if symptoms worsen or fail to improve.

## 2023-05-27 ENCOUNTER — Ambulatory Visit: Payer: Self-pay

## 2023-05-27 ENCOUNTER — Telehealth: Payer: Self-pay | Admitting: Internal Medicine

## 2023-05-27 ENCOUNTER — Other Ambulatory Visit: Payer: Self-pay | Admitting: Internal Medicine

## 2023-05-27 DIAGNOSIS — E039 Hypothyroidism, unspecified: Secondary | ICD-10-CM

## 2023-05-27 NOTE — Telephone Encounter (Signed)
 Copied from CRM [id number]. Topic: Referral - Request for Referral >> May 27, 2023  9:24 AM Alpha Arts wrote: Did the patient discuss referral with their provider in the last year? Yes (If No - schedule appointment) (If Yes - send message)  Appointment offered? No  Type of order/referral and detailed reason for visit:  pulmonologist  Preference of office, provider, location:  Dr. Suella Emmer,  [facility] [address] [place], [place] [address] Phone: [phone] Fax: [phone]  If referral order, have you been seen by this specialty before? No (If Yes, this issue or another issue? When? Where?  Can we respond through MyChart? Yes

## 2023-05-27 NOTE — Telephone Encounter (Signed)
 Copied from CRM [id number]. Topic: Referral - Status >> May 27, 2023 10:26 AM Magdalene School wrote: Reason for CRM: Patient is calling to request a call back from office regarding pulmonologist referral. She stated that she would like it to be marked urgent because she is having a really hard time with her health and needs to see the specialist ASAP.

## 2023-05-27 NOTE — Telephone Encounter (Signed)
  Chief Complaint: info only Additional Notes: Pt calling to initiate PULM Referral from Dr. Rochelle Chu. Triager initially thought she wanted to establish care with [facility] but would like to be referred to Dr. Debbora Fair at [facility]. Triager will forward encounter for Dr. Rochelle Chu 's office to review and submit referral.    Copied from CRM [id number]. Topic: Clinical - Red Word Triage >> May 27, 2023  5:02 PM Leory Rands wrote: Red Word that prompted transfer to Nurse Triage: Patient is calling to report that she is SOB - requesting for referral for pulmonary. Reason for Disposition  [1] Caller requesting NON-URGENT health information AND [2] PCP's office is the best resource  Answer Assessment - Initial Assessment Questions 1. RESPIRATORY STATUS: "Describe your breathing?" (e.g., wheezing, shortness of breath, unable to speak, severe coughing)      SOB 2. ONSET: "When did this breathing problem begin?"      X 1 week 3. PATTERN "Does the difficult breathing come and go, or has it been constant since it started?"      constant 4. SEVERITY: "How bad is your breathing?" (e.g., mild, moderate, severe)    - MILD: No SOB at rest, mild SOB with walking, speaks normally in sentences, can lie down, no retractions, pulse < 100.    - MODERATE: SOB at rest, SOB with minimal exertion and prefers to sit, cannot lie down flat, speaks in phrases, mild retractions, audible wheezing, pulse 100-120.    - SEVERE: Very SOB at rest, speaks in single words, struggling to breathe, sitting hunched forward, retractions, pulse > 120      Mild-moderate 5. RECURRENT SYMPTOM: "Have you had difficulty breathing before?" If Yes, ask: "When was the last time?" and "What happened that time?"      Hx of double PNA 6. CARDIAC HISTORY: "Do you have any history of heart disease?" (e.g., heart attack, angina, bypass surgery, angioplasty)      denies 7. LUNG HISTORY: "Do you have any history of lung disease?"  (e.g., pulmonary embolus, asthma,  emphysema)     Asthma, possible COPD 8. CAUSE: "What do you think is causing the breathing problem?"      Lung issues 9. OTHER SYMPTOMS: "Do you have any other symptoms? (e.g., dizziness, runny nose, cough, chest pain, fever)     Productive yellow mucus 10. O2 SATURATION MONITOR:  "Do you use an oxygen  saturation monitor (pulse oximeter) at home?" If Yes, ask: "What is your reading (oxygen  level) today?" "What is your usual oxygen  saturation reading?" (e.g., 95%)       Unknown, but does have SpO2 at home  Answer Assessment - Initial Assessment Questions 1. REASON FOR CALL or QUESTION: "What is your reason for calling today?" or "How can I best help you?" or "What question do you have that I can help answer?"     Pt calling in to request Pulm referral to [facility] Dr. Debbora Fair.  Protocols used: Breathing Difficulty-A-AH, Information Only Call - No Triage-A-AH

## 2023-05-28 ENCOUNTER — Ambulatory Visit: Payer: Self-pay

## 2023-05-28 ENCOUNTER — Other Ambulatory Visit: Payer: Self-pay | Admitting: Internal Medicine

## 2023-05-28 DIAGNOSIS — R052 Subacute cough: Secondary | ICD-10-CM

## 2023-05-28 NOTE — Telephone Encounter (Signed)
 Copied from CRM [id number]. Topic: Referral - Request for Referral >> May 27, 2023  4:57 PM [name] wrote: Pt needs referral sent to Dr. Suella Emmer urgently. Please call pt at [phone].

## 2023-05-28 NOTE — Telephone Encounter (Signed)
 Copied from CRM [id number]. Topic: Referral - Status >> May 28, 2023  8:33 AM Palma Bob wrote: Reason for CRM: Patient is calling in about the status of the pulmonary referral. Patient states its urgent and she still has not heard from anyone yet.   Chief Complaint: Referral Status Symptoms: n/a Frequency: n/a Pertinent Negatives: Patient denies n/a Disposition: [] ED /[] Urgent Care (no appt availability in office) / [] Appointment(In office/virtual)/ []  [place] [facility]/ [] Home Care/ [] Refused Recommended Disposition /[] [place] [facility]/ []  Follow-up with PCP Additional Notes: Patient calling for referral to be sent to Dr Debbora Fair at [facility]. Patient states no change in her condition.  Patient is extremely frustrated. She states that they were going to send her to [facility] but she wants to go to Dr Debbora Fair at [facility]. Patient states that she has been calling and her family has been trying to help her as well. This RN called the CAL to inquire about this process. CAL advised to tell the patient that her PCP is not seeing patients today but he may come in later to do some paperwork and she will send him a message to talk to him about sending a referral to her preferred Pulmonologist Dr Debbora Fair at [facility]. Patient is also advised that if she gets worse at any point to go to the Emergency Room. Patient states that she doesn't need to go to the Emergency Room at this time and that she just needs the referral to Dr EJ at [facility].   Reason for Disposition . [1] Follow-up call to recent contact AND [2] information only call, no triage required  Answer Assessment - Initial Assessment Questions 1. REASON FOR CALL or QUESTION: "What is your reason for calling today?" or "How can I best help you?" or "What question do you have that I can help answer?"     Referral Request  Protocols used: Information Only Call - No Triage-A-AH

## 2023-05-28 NOTE — Telephone Encounter (Signed)
 Pt has called in again for a status update. Please contact pt to discuss referral options.  [phone]

## 2023-05-28 NOTE — Telephone Encounter (Signed)
Please change this referral

## 2023-05-31 NOTE — Telephone Encounter (Signed)
 Referral has been placed.

## 2023-06-01 ENCOUNTER — Other Ambulatory Visit: Payer: Self-pay | Admitting: Internal Medicine

## 2023-06-01 DIAGNOSIS — E039 Hypothyroidism, unspecified: Secondary | ICD-10-CM

## 2023-06-01 NOTE — Telephone Encounter (Signed)
 Copied from CRM [id number]. Topic: Clinical - Medication Refill >> Jun 01, 2023  8:22 AM Freya Jesus wrote: Most Recent Primary Care Visit:  Provider: Colene Dauphin  Department: [facility]  Visit Type: ACUTE  Date: 05/26/2023  Medication: SYNTHROID  112 MCG tablet [[id number]]  Has the patient contacted their pharmacy? No (Agent: If no, request that the patient contact the pharmacy for the refill. If patient does not wish to contact the pharmacy document the reason why and proceed with request.) (Agent: If yes, when and what did the pharmacy advise?)  Pharmacy called and said she will need a new prescription.  Is this the correct pharmacy for this prescription? Yes If no, delete pharmacy and type the correct one.  This is the patient's preferred pharmacy:   [facility] - [place], [place] - [address] [place] [address] Phone: [phone] Fax: [phone]   Has the prescription been filled recently? No  Is the patient out of the medication? No  Has the patient been seen for an appointment in the last year OR does the patient have an upcoming appointment? Yes  Can we respond through MyChart? Yes  Agent: Please be advised that Rx refills may take up to 3 business days. We ask that you follow-up with your pharmacy.

## 2023-06-03 ENCOUNTER — Other Ambulatory Visit: Payer: Self-pay | Admitting: Internal Medicine

## 2023-06-03 DIAGNOSIS — I10 Essential (primary) hypertension: Secondary | ICD-10-CM

## 2023-06-07 MED ORDER — SYNTHROID 112 MCG PO TABS: ORAL_TABLET | ORAL | 0 refills | Status: DC

## 2023-06-08 ENCOUNTER — Other Ambulatory Visit: Payer: Self-pay

## 2023-06-09 DIAGNOSIS — J45909 Unspecified asthma, uncomplicated: Secondary | ICD-10-CM | POA: Diagnosis not present

## 2023-06-09 DIAGNOSIS — K219 Gastro-esophageal reflux disease without esophagitis: Secondary | ICD-10-CM | POA: Diagnosis not present

## 2023-06-09 DIAGNOSIS — E669 Obesity, unspecified: Secondary | ICD-10-CM | POA: Diagnosis not present

## 2023-06-09 DIAGNOSIS — R0609 Other forms of dyspnea: Secondary | ICD-10-CM | POA: Diagnosis not present

## 2023-06-28 ENCOUNTER — Other Ambulatory Visit: Payer: Self-pay | Admitting: Internal Medicine

## 2023-06-28 DIAGNOSIS — M8580 Other specified disorders of bone density and structure, unspecified site: Secondary | ICD-10-CM

## 2023-06-28 DIAGNOSIS — E559 Vitamin D deficiency, unspecified: Secondary | ICD-10-CM

## 2023-07-06 DIAGNOSIS — J45909 Unspecified asthma, uncomplicated: Secondary | ICD-10-CM | POA: Diagnosis not present

## 2023-07-22 DIAGNOSIS — K219 Gastro-esophageal reflux disease without esophagitis: Secondary | ICD-10-CM | POA: Diagnosis not present

## 2023-07-22 DIAGNOSIS — R0609 Other forms of dyspnea: Secondary | ICD-10-CM | POA: Diagnosis not present

## 2023-07-22 DIAGNOSIS — E669 Obesity, unspecified: Secondary | ICD-10-CM | POA: Diagnosis not present

## 2023-07-27 ENCOUNTER — Other Ambulatory Visit: Payer: Self-pay | Admitting: Internal Medicine

## 2023-07-27 DIAGNOSIS — G2581 Restless legs syndrome: Secondary | ICD-10-CM

## 2023-08-10 DIAGNOSIS — K219 Gastro-esophageal reflux disease without esophagitis: Secondary | ICD-10-CM | POA: Diagnosis not present

## 2023-08-10 DIAGNOSIS — R197 Diarrhea, unspecified: Secondary | ICD-10-CM | POA: Diagnosis not present

## 2023-08-11 DIAGNOSIS — R197 Diarrhea, unspecified: Secondary | ICD-10-CM | POA: Diagnosis not present

## 2023-08-20 ENCOUNTER — Other Ambulatory Visit: Payer: Self-pay | Admitting: Internal Medicine

## 2023-08-20 DIAGNOSIS — G2581 Restless legs syndrome: Secondary | ICD-10-CM

## 2023-08-24 ENCOUNTER — Other Ambulatory Visit: Payer: Self-pay | Admitting: Internal Medicine

## 2023-08-24 DIAGNOSIS — N1831 Chronic kidney disease, stage 3a: Secondary | ICD-10-CM

## 2023-08-24 DIAGNOSIS — E118 Type 2 diabetes mellitus with unspecified complications: Secondary | ICD-10-CM

## 2023-08-24 DIAGNOSIS — E039 Hypothyroidism, unspecified: Secondary | ICD-10-CM

## 2023-08-24 NOTE — Telephone Encounter (Unsigned)
 Copied from CRM [id number]. Topic: Clinical - Medication Refill >> Aug 24, 2023  8:26 AM Laymon HERO wrote: Medication: SYNTHROID  112 MCG tablet  Has the patient contacted their pharmacy? Yes (Agent: If no, request that the patient contact the pharmacy for the refill. If patient does not wish to contact the pharmacy document the reason why and proceed with request.) (Agent: If yes, when and what did the pharmacy advise?)  This is the patient's preferred pharmacy:   [facility] - [place], [place] - [address] [place] [address] Phone: [phone] Fax: [phone]   Is this the correct pharmacy for this prescription? Yes If no, delete pharmacy and type the correct one.   Has the prescription been filled recently? Yes  Is the patient out of the medication? Yes  Has the patient been seen for an appointment in the last year OR does the patient have an upcoming appointment? Yes  Can we respond through MyChart? Yes  Agent: Please be advised that Rx refills may take up to 3 business days. We ask that you follow-up with your pharmacy.

## 2023-08-24 NOTE — Telephone Encounter (Unsigned)
 Copied from CRM [id number]. Topic: Clinical - Medication Refill >> Aug 24, 2023  8:25 AM [place] wrote: Medication: dapagliflozin  propanediol (FARXIGA ) 10 MG TABS tablet  Has the patient contacted their pharmacy? Yes (Agent: If no, request that the patient contact the pharmacy for the refill. If patient does not wish to contact the pharmacy document the reason why and proceed with request.) (Agent: If yes, when and what did the pharmacy advise?)  This is the patient's preferred pharmacy:   [facility] - [place], [place] - [address] [place] [address] Phone: [phone] Fax: [phone]    Is this the correct pharmacy for this prescription? Yes If no, delete pharmacy and type the correct one.   Has the prescription been filled recently? Yes  Is the patient out of the medication? Yes  Has the patient been seen for an appointment in the last year OR does the patient have an upcoming appointment? Yes  Can we respond through MyChart? Yes  Agent: Please be advised that Rx refills may take up to 3 business days. We ask that you follow-up with your pharmacy.

## 2023-08-29 ENCOUNTER — Other Ambulatory Visit: Payer: Self-pay | Admitting: Internal Medicine

## 2023-08-29 DIAGNOSIS — I10 Essential (primary) hypertension: Secondary | ICD-10-CM

## 2023-09-01 NOTE — Telephone Encounter (Signed)
 Last OV 05/26/23 - acute visit Last med check 01/11/23, f/u 3 months Next OV 12/22/23  Last refill 06/07/23 Qty #90/0

## 2023-09-08 ENCOUNTER — Other Ambulatory Visit: Payer: Self-pay | Admitting: Internal Medicine

## 2023-09-08 DIAGNOSIS — E039 Hypothyroidism, unspecified: Secondary | ICD-10-CM

## 2023-09-08 NOTE — Telephone Encounter (Signed)
 Copied from CRM [id number]. Topic: Clinical - Medication Refill >> Sep 08, 2023  4:52 PM Jayma L wrote: Medication: SYNTHROID  112 MCG tablet  Has the patient contacted their pharmacy? Yes (Agent: If no, request that the patient contact the pharmacy for the refill. If patient does not wish to contact the pharmacy document the reason why and proceed with request.) (Agent: If yes, when and what did the pharmacy advise?)  This is the patient's preferred pharmacy:   [facility] - [place], [place] - [address] [place] [address] Phone: [phone] Fax: [phone]   Is this the correct pharmacy for this prescription? Yes If no, delete pharmacy and type the correct one.   Has the prescription been filled recently? No  Is the patient out of the medication? No  Has the patient been seen for an appointment in the last year OR does the patient have an upcoming appointment? Yes  Can we respond through MyChart? Yes  Agent: Please be advised that Rx refills may take up to 3 business days. We ask that you follow-up with your pharmacy.

## 2023-09-09 MED ORDER — SYNTHROID 112 MCG PO TABS: ORAL_TABLET | ORAL | 0 refills | Status: DC

## 2023-09-20 DIAGNOSIS — N393 Stress incontinence (female) (male): Secondary | ICD-10-CM | POA: Diagnosis not present

## 2023-09-20 DIAGNOSIS — R159 Full incontinence of feces: Secondary | ICD-10-CM | POA: Diagnosis not present

## 2023-09-20 DIAGNOSIS — N3941 Urge incontinence: Secondary | ICD-10-CM | POA: Diagnosis not present

## 2023-09-20 DIAGNOSIS — R32 Unspecified urinary incontinence: Secondary | ICD-10-CM | POA: Diagnosis not present

## 2023-09-22 ENCOUNTER — Ambulatory Visit: Payer: Self-pay | Admitting: Internal Medicine

## 2023-09-22 ENCOUNTER — Ambulatory Visit (INDEPENDENT_AMBULATORY_CARE_PROVIDER_SITE_OTHER): Admitting: Internal Medicine

## 2023-09-22 ENCOUNTER — Encounter: Payer: Self-pay | Admitting: Internal Medicine

## 2023-09-22 VITALS — BP 132/78 | HR 65 | Temp 98.2°F | Resp 16 | Ht 59.5 in | Wt 182.8 lb

## 2023-09-22 DIAGNOSIS — R5382 Chronic fatigue, unspecified: Secondary | ICD-10-CM | POA: Insufficient documentation

## 2023-09-22 DIAGNOSIS — Z7984 Long term (current) use of oral hypoglycemic drugs: Secondary | ICD-10-CM | POA: Diagnosis not present

## 2023-09-22 DIAGNOSIS — G2581 Restless legs syndrome: Secondary | ICD-10-CM

## 2023-09-22 DIAGNOSIS — E1122 Type 2 diabetes mellitus with diabetic chronic kidney disease: Secondary | ICD-10-CM | POA: Diagnosis not present

## 2023-09-22 DIAGNOSIS — N1831 Chronic kidney disease, stage 3a: Secondary | ICD-10-CM | POA: Diagnosis not present

## 2023-09-22 DIAGNOSIS — I10 Essential (primary) hypertension: Secondary | ICD-10-CM

## 2023-09-22 DIAGNOSIS — G4733 Obstructive sleep apnea (adult) (pediatric): Secondary | ICD-10-CM | POA: Diagnosis not present

## 2023-09-22 DIAGNOSIS — E785 Hyperlipidemia, unspecified: Secondary | ICD-10-CM

## 2023-09-22 DIAGNOSIS — E039 Hypothyroidism, unspecified: Secondary | ICD-10-CM | POA: Diagnosis not present

## 2023-09-22 DIAGNOSIS — Z23 Encounter for immunization: Secondary | ICD-10-CM | POA: Insufficient documentation

## 2023-09-22 DIAGNOSIS — E118 Type 2 diabetes mellitus with unspecified complications: Secondary | ICD-10-CM

## 2023-09-22 LAB — HEPATIC FUNCTION PANEL
ALT: 40 U/L — ABNORMAL HIGH (ref 0–35)
AST: 24 U/L (ref 0–37)
Albumin: 4.3 g/dL (ref 3.5–5.2)
Alkaline Phosphatase: 46 U/L (ref 39–117)
Bilirubin, Direct: 0.2 mg/dL (ref 0.0–0.3)
Total Bilirubin: 1 mg/dL (ref 0.2–1.2)
Total Protein: 6.9 g/dL (ref 6.0–8.3)

## 2023-09-22 LAB — CBC WITH DIFFERENTIAL/PLATELET
Basophils Absolute: 0 K/uL (ref 0.0–0.1)
Basophils Relative: 0.9 % (ref 0.0–3.0)
Eosinophils Absolute: 0 K/uL (ref 0.0–0.7)
Eosinophils Relative: 1.2 % (ref 0.0–5.0)
HCT: 44.8 % (ref 36.0–46.0)
Hemoglobin: 15 g/dL (ref 12.0–15.0)
Lymphocytes Relative: 32 % (ref 12.0–46.0)
Lymphs Abs: 1.2 K/uL (ref 0.7–4.0)
MCHC: 33.4 g/dL (ref 30.0–36.0)
MCV: 96.7 fl (ref 78.0–100.0)
Monocytes Absolute: 0.3 K/uL (ref 0.1–1.0)
Monocytes Relative: 8.2 % (ref 3.0–12.0)
Neutro Abs: 2.2 K/uL (ref 1.4–7.7)
Neutrophils Relative %: 57.7 % (ref 43.0–77.0)
Platelets: 252 K/uL (ref 150.0–400.0)
RBC: 4.64 Mil/uL (ref 3.87–5.11)
RDW: 13.9 % (ref 11.5–15.5)
WBC: 3.9 K/uL — ABNORMAL LOW (ref 4.0–10.5)

## 2023-09-22 LAB — LIPID PANEL
Cholesterol: 263 mg/dL — ABNORMAL HIGH (ref 0–200)
HDL: 44.3 mg/dL (ref >39.00)
LDL Cholesterol: 169 mg/dL — ABNORMAL HIGH (ref 0–99)
NonHDL: 218.54
Total CHOL/HDL Ratio: 6
Triglycerides: 248 mg/dL — ABNORMAL HIGH (ref 0.0–149.0)
VLDL: 49.6 mg/dL — ABNORMAL HIGH (ref 0.0–40.0)

## 2023-09-22 LAB — BASIC METABOLIC PANEL WITH GFR
BUN: 14 mg/dL (ref 6–23)
CO2: 28 meq/L (ref 19–32)
Calcium: 9.1 mg/dL (ref 8.4–10.5)
Chloride: 102 meq/L (ref 96–112)
Creatinine, Ser: 0.74 mg/dL (ref 0.40–1.20)
GFR: 80.4 mL/min (ref >60.00)
Glucose, Bld: 132 mg/dL — ABNORMAL HIGH (ref 70–99)
Potassium: 3.8 meq/L (ref 3.5–5.1)
Sodium: 141 meq/L (ref 135–145)

## 2023-09-22 LAB — TSH: TSH: 3.7 u[IU]/mL (ref 0.35–5.50)

## 2023-09-22 LAB — URINALYSIS, ROUTINE W REFLEX MICROSCOPIC
Bilirubin Urine: NEGATIVE
Hgb urine dipstick: NEGATIVE
Ketones, ur: NEGATIVE
Leukocytes,Ua: NEGATIVE
Nitrite: NEGATIVE
Specific Gravity, Urine: 1.02 (ref 1.000–1.030)
Total Protein, Urine: NEGATIVE
Urine Glucose: >=1000 — AB
Urobilinogen, UA: 0.2 (ref 0.0–1.0)
pH: 6 (ref 5.0–8.0)

## 2023-09-22 LAB — MICROALBUMIN / CREATININE URINE RATIO
Creatinine,U: 70.1 mg/dL
Microalb Creat Ratio: 24.9 mg/g (ref 0.0–30.0)
Microalb, Ur: 1.7 mg/dL (ref 0.0–1.9)

## 2023-09-22 LAB — CK: Total CK: 82 U/L (ref 17–177)

## 2023-09-22 LAB — HEMOGLOBIN A1C: Hgb A1c MFr Bld: 8 % — ABNORMAL HIGH (ref 4.6–6.5)

## 2023-09-22 MED ORDER — METFORMIN HCL ER 750 MG PO TB24: 750.0000 mg | ORAL_TABLET | Freq: Every day | ORAL | 1 refills | Status: AC

## 2023-09-22 MED ORDER — LEVOTHYROXINE SODIUM 125 MCG PO TABS: 125.0000 ug | ORAL_TABLET | Freq: Every day | ORAL | 1 refills | Status: DC

## 2023-09-22 MED ORDER — EZETIMIBE 10 MG PO TABS: 10.0000 mg | ORAL_TABLET | Freq: Every day | ORAL | 1 refills | Status: AC

## 2023-09-22 MED ORDER — SHINGRIX 50 MCG/0.5ML IM SUSR: 0.5000 mL | Freq: Once | INTRAMUSCULAR | 1 refills | Status: AC

## 2023-09-22 MED ORDER — BOOSTRIX 5-2.5-18.5 LF-MCG/0.5 IM SUSP: 0.5000 mL | Freq: Once | INTRAMUSCULAR | 0 refills | Status: AC

## 2023-09-22 MED ORDER — DAPAGLIFLOZIN PROPANEDIOL 10 MG PO TABS: 10.0000 mg | ORAL_TABLET | Freq: Every day | ORAL | 1 refills | Status: DC

## 2023-09-22 MED ORDER — ROSUVASTATIN CALCIUM 10 MG PO TABS: 10.0000 mg | ORAL_TABLET | Freq: Every day | ORAL | 1 refills | Status: AC

## 2023-09-22 NOTE — Progress Notes (Unsigned)
 Subjective:  Patient ID: Sergio  Padovano, female    DOB: 1950/11/08  Age: 73 y.o. MRN: [id number]  CC: Osteoarthritis, Hypertension, Hypothyroidism, Gastroesophageal Reflux, Hyperlipidemia, and Diabetes   HPI Doria  Friesenhahn presents for f/up ---  Discussed the use of AI scribe software for clinical note transcription with the patient, who gave verbal consent to proceed.  History of Present Illness Taraji  Halpin is a 73 year old female who presents with fatigue and leg pain.  She experiences persistent leg pain and swelling, describing her legs as hurting constantly. She has a history of multiple leg replacements and a previous leg fracture. The swelling is significant at times, and she has not been sleeping well, contributing to her fatigue.  She has an intermittent cough with clear phlegm. No hemoptysis. She is not using nasal sprays to manage these symptoms.  She is taking pantoprazole  for heartburn or indigestion. She denies any other treatments for these symptoms. She is scheduled for an upper endoscopy but is unsure of the specific reason for the procedure.  She reports excessive thirst and urination, which led her to consult a pelvic doctor. She is performing exercises and is scheduled for a follow-up in September. She mentions a family member, Warren, who had a similar issue.  She is supposed to have cataract surgery but has not yet undergone the procedure.  No symptoms indicating her thyroid  medication dose is too high or too low, although she has been feeling fatigued. She has adjusted her medication intake to see if it helps with her symptoms.  She has had shingles in the past but is unsure about her vaccination status.     Outpatient Medications Prior to Visit  Medication Sig Dispense Refill   acetaminophen  (TYLENOL ) 500 MG tablet Take 1,000 mg by mouth 3 (three) times daily.     aspirin  EC 81 MG tablet Take 1 tablet (81 mg total) by mouth daily. Swallow whole.      Blood Glucose Monitoring Suppl (FREESTYLE LITE) w/Device KIT USE TWO TIMES A DAY TO TEST GLUCOSE CONTROL 1 kit 0   Cholecalciferol (VITAMIN D3) 50 MCG (2000 UT) capsule TAKE 1 CAPSULE BY MOUTH DAILY 90 capsule 0   fluocinonide -emollient (LIDEX -E) 0.05 % cream Apply 1 Application topically 2 (two) times daily. 60 g 1   FREESTYLE LITE test strip USE 1 STRIP TO TEST TWICE A DAY 50 strip 0   GEMTESA 75 MG TABS Take 75 mg by mouth daily.     Insulin  Pen Needle 32G X 6 MM MISC 1 Act by Does not apply route once a week. 30 each 0   levocetirizine (XYZAL ) 5 MG tablet Take 1 tablet (5 mg total) by mouth every evening. 90 tablet 1   nitroGLYCERIN  (NITROSTAT ) 0.4 MG SL tablet Place 1 tablet (0.4 mg total) under the tongue every 5 (five) minutes as needed for chest pain. 25 tablet 3   olmesartan  (BENICAR ) 20 MG tablet TAKE 1 TABLET BY MOUTH DAILY 90 tablet 0   pantoprazole  (PROTONIX ) 40 MG tablet TAKE 1 TABLET BY MOUTH DAILY 90 tablet 0   rOPINIRole  (REQUIP ) 0.5 MG tablet TAKE 1 TO 2 TABLETS BY MOUTH AT NOON AND 5PM THEN TAKE 3 TABLETS BY MOUTH EVERY NIGHT AT BEDTIME 360 tablet 0   dapagliflozin  propanediol (FARXIGA ) 10 MG TABS tablet Take 1 tablet (10 mg total) by mouth daily before breakfast. 90 tablet 3   HYDROcodone  bit-homatropine (HYCODAN) 5-1.5 MG/5ML syrup Take 5 mLs by mouth every 8 (eight) hours as needed  for cough. 120 mL 0   SYNTHROID  112 MCG tablet TAKE 1 TABLET DAILY WITH BREAKFAST 90 tablet 0   No facility-administered medications prior to visit.    ROS Review of Systems  Constitutional:  Positive for fatigue. Negative for appetite change, chills, diaphoresis and unexpected weight change.  HENT:  Positive for postnasal drip and rhinorrhea. Negative for congestion, facial swelling, nosebleeds, sinus pressure, sore throat, trouble swallowing and voice change.   Eyes: Negative.   Respiratory:  Positive for apnea and cough. Negative for shortness of breath and wheezing.   Cardiovascular:   Negative for chest pain, palpitations and leg swelling.  Gastrointestinal:  Positive for diarrhea. Negative for abdominal pain, blood in stool, constipation, nausea and vomiting.  Endocrine: Positive for polyuria. Negative for cold intolerance, heat intolerance, polydipsia and polyphagia.  Genitourinary:  Positive for frequency. Negative for difficulty urinating and dysuria.  Musculoskeletal:  Positive for arthralgias. Negative for myalgias.  Skin: Negative.  Negative for color change, pallor and rash.  Neurological:  Negative for dizziness, weakness and light-headedness.  Hematological:  Negative for adenopathy. Does not bruise/bleed easily.  Psychiatric/Behavioral:  Positive for sleep disturbance.     Objective:  BP 132/78 (BP Location: Left Arm, Patient Position: Sitting, Cuff Size: Normal)   Pulse 65   Temp 98.2 F (36.8 C) (Oral)   Resp 16   Ht 4' 11.5 (1.511 m)   Wt 182 lb 12.8 oz (82.9 kg)   SpO2 96%   BMI 36.30 kg/m   BP Readings from Last 3 Encounters:  09/22/23 132/78  05/26/23 (!) 142/80  04/01/23 (!) 146/82    Wt Readings from Last 3 Encounters:  09/22/23 182 lb 12.8 oz (82.9 kg)  05/26/23 180 lb (81.6 kg)  04/01/23 175 lb 12.8 oz (79.7 kg)    Physical Exam Vitals reviewed.  Constitutional:      Appearance: Normal appearance.  HENT:     Nose: Nose normal.     Mouth/Throat:     Pharynx: Oropharynx is clear.  Eyes:     General: No scleral icterus.    Conjunctiva/sclera: Conjunctivae normal.  Cardiovascular:     Rate and Rhythm: Normal rate and regular rhythm.     Heart sounds: No murmur heard.    No friction rub. No gallop.     Comments: EKG-- NSR, 61 bpm No LVH, Q waves, or ST/T wave changes  Unchanged  Pulmonary:     Effort: Pulmonary effort is normal.     Breath sounds: No stridor. No wheezing, rhonchi or rales.  Abdominal:     General: Abdomen is flat. Bowel sounds are normal. There is no distension.     Palpations: Abdomen is soft. There is  no hepatomegaly, splenomegaly or mass.     Tenderness: There is no abdominal tenderness. There is no guarding.     Hernia: No hernia is present.  Musculoskeletal:        General: Normal range of motion.     Cervical back: Neck supple.     Right lower leg: No edema.     Left lower leg: No edema.  Lymphadenopathy:     Cervical: No cervical adenopathy.  Skin:    General: Skin is warm and dry.     Findings: No rash.  Neurological:     General: No focal deficit present.     Mental Status: She is alert.  Psychiatric:        Mood and Affect: Mood normal.  Behavior: Behavior normal.     Lab Results  Component Value Date   WBC 3.9 (L) 09/22/2023   HGB 15.0 09/22/2023   HCT 44.8 09/22/2023   PLT 252.0 09/22/2023   GLUCOSE 132 (H) 09/22/2023   CHOL 263 (H) 09/22/2023   TRIG 248.0 (H) 09/22/2023   HDL 44.30 09/22/2023   LDLDIRECT 142.0 01/28/2016   LDLCALC 169 (H) 09/22/2023   ALT 40 (H) 09/22/2023   AST 24 09/22/2023   NA 141 09/22/2023   K 3.8 09/22/2023   CL 102 09/22/2023   CREATININE 0.74 09/22/2023   BUN 14 09/22/2023   CO2 28 09/22/2023   TSH 3.70 09/22/2023   INR 1.12 03/17/2017   HGBA1C 8.0 (H) 09/22/2023   MICROALBUR 1.7 09/22/2023    DG Bone Density Result Date: 03/04/2023 Table formatting from the original result was not included. Date of study: 03/04/2023 Exam: DUAL X-RAY ABSORPTIOMETRY (DXA) FOR BONE MINERAL DENSITY (BMD) Instrument: [employer] Requesting Provider: PCP Indication: follow up for low BMD Comparison: none (please note that it is not possible to compare data from different instruments) Clinical data: Pt is a 73 y.o. female with previous history of fracture. Results:  Lumbar spine L1-L4 Femoral neck (FN) T-score -0.4 RFN: -1.7 LFN: -1.7 Assessment: By the [facility] Criteria for diagnosis based on bone density, this patient has Low Bone Density FRAX 10-year fracture risk calculator: 16.5 % for any major fracture and 2.9 % for hip fracture.  Pharmacologic therapy is recommended if 10 year fracture risk is >20% for any major osteoporotic fracture or >3% for hip fracture.  Comments: the technical quality of the study is good. WHO criteria for diagnosis of osteoporosis in postmenopausal women and in men 37 y/o or older: - normal: T-score -1.0 to + 1.0 - osteopenia/low bone density: T-score between -2.5 and -1.0 - osteoporosis: T-score below -2.5 - severe osteoporosis: T-score below -2.5 with history of fragility fracture Note: although not part of the WHO classification, the presence of a fragility fracture, regardless of the T-score, should be considered diagnostic of osteoporosis, provided other causes for the fracture have been excluded. RECOMMENDATION: 1. All patients should optimize calcium  and vitamin D  intake. 2. Consider FDA-approved medical therapies in postmenopausal women and men aged 49 years and older, based on the following: a. A hip or vertebral(clinical or morphometric) fracture. b. T-Score of  -2.5 or less at the femoral neck , total hip or spine after appropriate evaluation to exclude secondary causes c. Low bone mass (T-score between -1.0 and -2.5 at the femoral neck or spine) and a 10 year probability of a hip fracture >3% or a 10 year probability of major osteoporosis-related fracture > 20% based on the [place] -adapted WHO algorithm d. Clinical judgement and/or patient preferences may indicate treatment for people with 10-year fracture probabilities above or below these levels Follow up BMD is recommended: 2 years Interpreted by : Stefano Redgie Butts, MD [facility]   Fibrosis 4 Score = 1.1  Fib-4 interpretation is not validated for people under 35 or over 4 years of age. However, scores under 2.0 are generally considered low risk.   Assessment & Plan:  Acquired hypothyroidism -     TSH; Future -     Levothyroxine  Sodium; Take 1 tablet (125 mcg total) by mouth daily.  Dispense: 90 tablet; Refill: 1 -     AMB  Referral [facility]BCI Care Management  Type II diabetes mellitus with manifestations (HCC) -     Urinalysis, Routine w reflex  microscopic; Future -     Hemoglobin A1c; Future -     Basic metabolic panel with GFR; Future -     Microalbumin / creatinine urine ratio; Future -     HM Diabetes Foot Exam -     Dapagliflozin  Propanediol; Take 1 tablet (10 mg total) by mouth daily before breakfast.  Dispense: 90 tablet; Refill: 1 -     metFORMIN  HCl ER; Take 1 tablet (750 mg total) by mouth daily with breakfast.  Dispense: 90 tablet; Refill: 1 -     Ambulatory referral to Ophthalmology -     AMB Referral [facility]BCI Care Management  Dyslipidemia, goal LDL below 70 -     Lipid panel; Future -     TSH; Future -     Hepatic function panel; Future -     CK; Future -     Rosuvastatin  Calcium ; Take 1 tablet (10 mg total) by mouth daily.  Dispense: 90 tablet; Refill: 1 -     Ezetimibe ; Take 1 tablet (10 mg total) by mouth daily.  Dispense: 90 tablet; Refill: 1 -     AMB Referral VBCI Care Management  Primary hypertension -     Urinalysis, Routine w reflex microscopic; Future -     CBC with Differential/Platelet; Future -     Basic metabolic panel with GFR; Future -     EKG 12-Lead -     AMB Referral VBCI Care Management  RLS (restless legs syndrome) -     Pulmonary Visit  Obstructive sleep apnea -     Pulmonary Visit  Need for prophylactic vaccination with combined diphtheria-tetanus-pertussis (DTP) vaccine -     Boostrix ; Inject 0.5 mLs into the muscle once for 1 dose.  Dispense: 0.5 mL; Refill: 0  Need for prophylactic vaccination and inoculation against varicella -     Shingrix ; Inject 0.5 mLs into the muscle once for 1 dose.  Dispense: 0.5 mL; Refill: 1  Stage 3a chronic kidney disease (HCC) -     Dapagliflozin  Propanediol; Take 1 tablet (10 mg total) by mouth daily before breakfast.  Dispense: 90 tablet; Refill: 1 -     AMB Referral VBCI Care Management  Chronic fatigue     Follow-up: Return  in about 6 months (around 03/24/2024).  Debby Molt, MD

## 2023-09-22 NOTE — Patient Instructions (Signed)

## 2023-09-23 ENCOUNTER — Telehealth: Payer: Self-pay

## 2023-09-23 ENCOUNTER — Telehealth: Payer: Self-pay | Admitting: Internal Medicine

## 2023-09-23 ENCOUNTER — Other Ambulatory Visit: Payer: Self-pay | Admitting: Internal Medicine

## 2023-09-23 DIAGNOSIS — E118 Type 2 diabetes mellitus with unspecified complications: Secondary | ICD-10-CM

## 2023-09-23 DIAGNOSIS — N1831 Chronic kidney disease, stage 3a: Secondary | ICD-10-CM

## 2023-09-23 DIAGNOSIS — E039 Hypothyroidism, unspecified: Secondary | ICD-10-CM

## 2023-09-23 NOTE — Telephone Encounter (Signed)
 Copied from CRM [id number]. Topic: Clinical - Prescription Issue >> Sep 23, 2023  9:01 AM Roselie BROCKS wrote: Reason for CRM: Patients prescription was sent to [facility], and Patient states its to always go to [facility] - [place], [place] - [address] home delivery service. For medication SYNTHROID  125, Patient states it can not be  The generic it must be the Synthroid  Brand nme

## 2023-09-23 NOTE — Telephone Encounter (Signed)
 Copied from CRM [id number]. Topic: Clinical - Medication Refill >> Sep 23, 2023 11:34 AM Suzen RAMAN wrote: Medication: levothyroxine  (SYNTHROID ) 125 MCG tablet-Needs to be the generic not the brand name- 90 day supply-[facility] - [place], [place] - [address] [place] [address] Phone: [phone] Fax: [phone]   Blood Glucose Monitoring Suppl (FREESTYLE LITE) w/Device KIT-send to  [facility] [id number] [place], [place] - [address] Phone: [phone]  Fax: [phone]    dapagliflozin  propanediol (FARXIGA ) 10 MG TABS tablet-send to [facility] - [place], [place] - [address]  Phone: [phone]  Fax: [phone]      Has the patient contacted their pharmacy? Yes  This is the patient's preferred pharmacy: Multiple specific above per medication per patient   Is this the correct pharmacy for this prescription? Yes If no, delete pharmacy and type the correct one.   Has the prescription been filled recently? No  Is the patient out of the medication? No  Has the patient been seen for an appointment in the last year OR does the patient have an upcoming appointment? Yes  Can we respond through MyChart? Yes  Agent: Please be advised that Rx refills may take up to 3 business days. We ask that you follow-up with your pharmacy.

## 2023-09-23 NOTE — Telephone Encounter (Unsigned)
 Copied from CRM [id number]. Topic: Clinical - Medication Refill >> Sep 23, 2023 11:34 AM Suzen RAMAN wrote: Medication: levothyroxine  (SYNTHROID ) 125 MCG tablet-Needs to be the generic not the brand name- 90 day supply  Has the patient contacted their pharmacy? Yes  This is the patient's preferred pharmacy:   [facility] - [place], [place] - [address] [place] [address] Phone: [phone] Fax: [phone]   Is this the correct pharmacy for this prescription? Yes If no, delete pharmacy and type the correct one.   Has the prescription been filled recently? No  Is the patient out of the medication? No  Has the patient been seen for an appointment in the last year OR does the patient have an upcoming appointment? Yes  Can we respond through MyChart? Yes  Agent: Please be advised that Rx refills may take up to 3 business days. We ask that you follow-up with your pharmacy.

## 2023-09-24 ENCOUNTER — Other Ambulatory Visit: Payer: Self-pay | Admitting: Internal Medicine

## 2023-09-30 ENCOUNTER — Other Ambulatory Visit: Payer: Self-pay

## 2023-09-30 DIAGNOSIS — E039 Hypothyroidism, unspecified: Secondary | ICD-10-CM

## 2023-09-30 MED ORDER — LEVOTHYROXINE SODIUM 125 MCG PO TABS: 125.0000 ug | ORAL_TABLET | Freq: Every day | ORAL | 1 refills | Status: DC

## 2023-09-30 NOTE — Telephone Encounter (Signed)
 Medication has been resent to the correct pharmacy and has been made aware. Gave a verbal understanding.

## 2023-10-01 NOTE — Progress Notes (Signed)
 Cardiology Office Note:    Date:  10/04/2023   ID:  Helen  Lin, DOB 1950-05-13, MRN [id number]  PCP:  Joshua Debby CROME, MD  [facility] Providers Cardiologist:  Maude Emmer, MD     Referring MD: Joshua Debby CROME, MD   Chief Complaint:  No chief complaint on file.    Patient Profile: Chest pain  Cath 06/2021: normal coronary arteries  Mild pulmonary hypertension  RHC 06/2021: mean PAP 30 Hypertension  Hyperlipidemia  Hypothyroidism  Diverticulitis s/p partial colectomy in 2019 Diabetes mellitus GERD  Sleep apnea  Prior CV Studies: ECHO COMPLETE WO IMAGING ENHANCING AGENT 08/06/2021 EF 65-70, no RWMA, Gr 1 DD, normal RVSF, TR signal not adequate to assess PAP, trivial MR, AV sclerosis w/o AS    VQ scan 08/09/2021 Normal perfusion scan  CARDIAC CATHETERIZATION 07/24/2021 Normal coronary arteries EF 55-65 // LVEDP 12; Mild pulmonary hypertension, mean PAP 30   ZIO MONITOR 3 DAYS Patient had a min HR of 44 bpm, max HR of 131 bpm, and avg HR of 72 bpm. Predominant underlying rhythm was Sinus Rhythm.  PACs <1%, PVCs 2%    GATED SPECT MYO PERF W/LEXISCAN  STRESS 1D 02/07/2018 EF 63, normal perfusion, low risk   ECHO COMPLETE WO IMAGING ENHANCING AGENT 03/22/2017 EF 60-65, PASP 33    CPET 03/06/2013 Normal functional capacity, no clear circulatory or ventilatory limitation with exercise  History of Present Illness:   Helen  Lin is a 73 y.o. female with the above problem list. Cath with no CAD and normal left sided filling pressures Right heart catheterization evidence of mild pulmonary hypertension.  Her echocardiogram demonstrated normal LV function and normal RVSF.  PA pressure could not be adequately assessed on the echocardiogram.  VQ scan was normal.  Sleep study did demonstrate severe sleep apnea.  She is now seeing Dr Shlomo for this She had mildly abnormal ABI's 0.90 on 01/07/22 and primary put her on low dose Xarelto  Wave forms all multiphasic   Her  leg pain is not vascular improved with walking and worse with prolonged sitting Discussed having a statin holiday for 4 weeks during February visit Seen in ED 06/16/22 for right flank / back pain CTA negative for PE CT renal stone study negative Limited relief with muscle relaxant Given lidocaine  patch and Roxicodone     She is describing severe restless leg and neuropathy  ABIs were normal 09/03/22 as was US  duplex with bilateral triphasic pulses  Has dry cough ? Viral/allergies     Past Medical History:  Diagnosis Date   Acute pyelonephritis 05/02/2012   Asthma    Diverticulitis 03/24/2017   see report [facility]   Dyspnea    nl PFTs, and echo   GERD (gastroesophageal reflux disease)    Headache(784.0)    Heart murmur    History of echocardiogram    Echocardiogram 7/23: EF 65-70, no RWMA, Gr 1 DD, normal RVSF, inadequate TR signal to est PASP, trivial MR, AV sclerosis w/o AS   History of kidney stones    IBS (irritable bowel syndrome)    Nephrolithiasis    Pre-diabetes 12/2013   Recurrent oral ulcers    Thyroid  goiter    I 131 rx and then  total throidectomy 2005 [facility]   Current Medications: Current Meds  Medication Sig   acetaminophen  (TYLENOL ) 500 MG tablet Take 1,000 mg by mouth 3 (three) times daily.   aspirin  EC 81 MG tablet Take 1 tablet (81 mg total) by mouth daily. Swallow  whole.   Blood Glucose Monitoring Suppl (FREESTYLE LITE) w/Device KIT USE TWO TIMES A DAY TO TEST GLUCOSE CONTROL   Cholecalciferol (VITAMIN D3) 50 MCG (2000 UT) capsule TAKE 1 CAPSULE BY MOUTH DAILY   dapagliflozin  propanediol (FARXIGA ) 10 MG TABS tablet Take 1 tablet (10 mg total) by mouth daily before breakfast.   ezetimibe  (ZETIA ) 10 MG tablet Take 1 tablet (10 mg total) by mouth daily.   fluocinonide -emollient (LIDEX -E) 0.05 % cream Apply 1 Application topically 2 (two) times daily.   FREESTYLE LITE test strip USE 1 STRIP TO TEST TWICE A DAY   GEMTESA 75 MG TABS Take 75 mg by mouth  daily.   Insulin  Pen Needle 32G X 6 MM MISC 1 Act by Does not apply route once a week.   levocetirizine (XYZAL ) 5 MG tablet Take 1 tablet (5 mg total) by mouth every evening.   levothyroxine  (SYNTHROID ) 125 MCG tablet Take 1 tablet (125 mcg total) by mouth daily.   metFORMIN  (GLUCOPHAGE -XR) 750 MG 24 hr tablet Take 1 tablet (750 mg total) by mouth daily with breakfast.   nitroGLYCERIN  (NITROSTAT ) 0.4 MG SL tablet Place 1 tablet (0.4 mg total) under the tongue every 5 (five) minutes as needed for chest pain.   olmesartan  (BENICAR ) 20 MG tablet TAKE 1 TABLET BY MOUTH DAILY   pantoprazole  (PROTONIX ) 40 MG tablet TAKE 1 TABLET BY MOUTH DAILY   rOPINIRole  (REQUIP ) 0.5 MG tablet TAKE 1 TO 2 TABLETS BY MOUTH AT NOON AND 5PM THEN TAKE 3 TABLETS BY MOUTH EVERY NIGHT AT BEDTIME   rosuvastatin  (CRESTOR ) 10 MG tablet Take 1 tablet (10 mg total) by mouth daily.    Allergies:   Hydralazine , Ciprofloxacin , Propofol , and Ritalin [methylphenidate hcl]   Social History   Tobacco Use   Smoking status: Never   Smokeless tobacco: Never  Vaping Use   Vaping status: Never Used  Substance Use Topics   Alcohol use: Yes    Comment: seldom   Drug use: No    Family Hx: The patient's family history includes Aneurysm in her father; Arthritis in an other family member; COPD in her sister; Cancer in her mother and another family member; Coronary artery disease in her father and sister; Diabetes in an other family member; Heart disease in an other family member; Hyperlipidemia in an other family member; Hypertension in an other family member; Stroke in an other family member.  ROS - See HPI  EKGs/Labs/Other Test Reviewed:    EKG:    Recent Labs: 09/22/2023: ALT 40; BUN 14; Creatinine, Ser 0.74; Hemoglobin 15.0; Platelets 252.0; Potassium 3.8; Sodium 141; TSH 3.70   Recent Lipid Panel Recent Labs    09/22/23 0922  CHOL 263*  TRIG 248.0*  HDL 44.30  VLDL 49.6*  LDLCALC 169*     Physical Exam:    VS:   BP 136/74   Pulse 71   Ht 4' 11 (1.499 m)   Wt 177 lb (80.3 kg)   SpO2 96%   BMI 35.75 kg/m     Wt Readings from Last 3 Encounters:  10/04/23 177 lb (80.3 kg)  09/22/23 182 lb 12.8 oz (82.9 kg)  05/26/23 180 lb (81.6 kg)     Affect appropriate Healthy:  appears stated age HEENT: normal Neck supple with no adenopathy JVP normal no bruits no thyromegaly Lungs clear with no wheezing and good diaphragmatic motion Heart:  S1/S2 no murmur, no rub, gallop or click PMI normal Abdomen: benighn, BS positve, no tenderness, no AAA  no bruit.  No HSM or HJR Distal pulses intact with no bruits No edema Neuro non-focal Skin warm and dry No muscular weakness      ASSESSMENT & PLAN:    OSA:  severe f/u Dr Shlomo for CPAP and titration likely responsible for mild pulmonary HTN noted at cath In regard to dyspnea EF normal on TTE 08/06/21 no signs of cor pulmonale V/Q negative and no significant valve ds Chest Pain:  non cardiac normal cors on cath 07/24/21  DM: Discussed low carb diet.  Target hemoglobin A1c is 6.5 or less.  Continue current medications. Last A1c elevated 8.0  HTN:  Well controlled.  Continue current medications and low sodium Dash type diet.   HLD: resume  crestor  and zetia  Thyroid :  on synthroid  replacement TSH normal 09/22/23 3.7  Leg Pain:  not claudication. ABI's normal and US  triphasic waveforms MRI spine only mild bilateral foraminal stenosis L23. Neuropathy and restless leg syndrome f/u primary Not improved with statin holiday On Lidex  cream and requip  CK normal 09/22/23   Medication Adjustments/Labs and Tests Ordered:  None Suggested d/c Requip  if it has not helped   F/U I a year  Tests Ordered: No orders of the defined types were placed in this encounter.  Medication Changes: No orders of the defined types were placed in this encounter.  Signed, Maude Emmer, MD  10/04/2023 10:00 AM    [facility] [address] [place], [place], [place]  [address] Phone:  [phone]; Fax: [phone]

## 2023-10-04 ENCOUNTER — Ambulatory Visit: Attending: Cardiovascular Disease | Admitting: Cardiovascular Disease

## 2023-10-04 VITALS — BP 136/74 | HR 71 | Ht 59.0 in | Wt 177.0 lb

## 2023-10-04 DIAGNOSIS — I272 Pulmonary hypertension, unspecified: Secondary | ICD-10-CM | POA: Diagnosis not present

## 2023-10-04 DIAGNOSIS — R079 Chest pain, unspecified: Secondary | ICD-10-CM | POA: Diagnosis not present

## 2023-10-04 DIAGNOSIS — G4733 Obstructive sleep apnea (adult) (pediatric): Secondary | ICD-10-CM | POA: Insufficient documentation

## 2023-10-04 MED ORDER — FREESTYLE LITE W/DEVICE KIT: PACK | 0 refills | Status: AC

## 2023-10-04 NOTE — Patient Instructions (Addendum)
 Medication Instructions:  Your physician recommends that you continue on your current medications as directed. Please refer to the Current Medication list given to you today.  *If you need a refill on your cardiac medications before your next appointment, please call your pharmacy*  Follow-Up: At [facility], you and your health needs are our priority.  As part of our continuing mission to provide you with exceptional heart care, our providers are all part of one team.  This team includes your primary Cardiologist (physician) and Advanced Practice Providers or APPs (Physician Assistants and Nurse Practitioners) who all work together to provide you with the care you need, when you need it.  Your next appointment:   1 year  Provider:   Maude Emmer, MD

## 2023-10-05 ENCOUNTER — Telehealth: Payer: Self-pay | Admitting: Radiology

## 2023-10-05 ENCOUNTER — Telehealth: Payer: Self-pay | Admitting: *Deleted

## 2023-10-05 ENCOUNTER — Other Ambulatory Visit: Payer: Self-pay | Admitting: Internal Medicine

## 2023-10-05 NOTE — Telephone Encounter (Signed)
 Copied from CRM [id number]. Topic: Clinical - Prescription Issue >> Oct 05, 2023 10:41 AM Rea ORN wrote: Reason for CRM: Pt was seen at Cardiology and had her legs checked for neuropathy. She told him that rOPINIRole  (REQUIP ) 0.5 MG tablet  is not working for her any longer. She was advised to contact PCP to see what else she can take.

## 2023-10-05 NOTE — Progress Notes (Unsigned)
 Care Guide Pharmacy Note  10/05/2023 Name: Astryd  Kemnitz MRN: [id number] DOB: Jan 09, 1951  Referred By: Joshua Debby CROME, MD Reason for referral: Call Attempt #1 and Complex Care Management (Outreach to schedule referral with pharmacist )   Starasia  Behringer is a 73 y.o. year old female who is a primary care patient of Joshua Debby CROME, MD.  Kyrstal  Schuhmacher was referred to the pharmacist for assistance related to: HTN, HLD, and DMII  An unsuccessful telephone outreach was attempted today to contact the patient who was referred to the pharmacy team for assistance with medication management. Additional attempts will be made to contact the patient.  Thedford Franks, CMA [place]  [facility], [facility] Guide Direct Dial: [phone]  Fax: [phone] Website: [email or web address]

## 2023-10-05 NOTE — Telephone Encounter (Signed)
 Copied from CRM [id number]. Topic: Clinical - Prescription Issue >> Oct 05, 2023 10:38 AM Rea ORN wrote: Reason for CRM: Pt stated she can not take generic Synthroid , Levothyroxine . Levothyroxine  was ordered on 9/4 to her mail order pharmacy. Pt is asking can this be submitted back to pharmacy as Synthroid  125 mcg?

## 2023-10-06 NOTE — Progress Notes (Unsigned)
 Care Guide Pharmacy Note  10/06/2023 Name: Helen  Lin MRN: [id number] DOB: November 06, 1950  Referred By: Joshua Debby CROME, MD Reason for referral: Call Attempt #1 and Complex Care Management (Outreach to schedule referral with pharmacist )   Helen  Lin is a 73 y.o. year old female who is a primary care patient of Joshua Debby CROME, MD.  Helen  Lin was referred to the pharmacist for assistance related to: HTN, HLD, and DMII  A second unsuccessful telephone outreach was attempted today to contact the patient who was referred to the pharmacy team for assistance with medication management. Additional attempts will be made to contact the patient.  Thedford Franks, CMA [place]  [facility], [facility] Guide Direct Dial: [phone]  Fax: [phone] Website: [email or web address]

## 2023-10-07 NOTE — Progress Notes (Signed)
 Care Guide Pharmacy Note  10/07/2023 Name: Helen  Lin MRN: [id number] DOB: [birth year]  Referred By: Joshua Debby CROME, MD Reason for referral: Call Attempt #1 and Complex Care Management (Outreach to schedule referral with pharmacist )   Helen  Lin is a 73 y.o. year old female who is a primary care patient of Joshua Debby CROME, MD.  Helen  Lin was referred to the pharmacist for assistance related to: HTN, HLD, and DMII  A third unsuccessful telephone outreach was attempted today to contact the patient who was referred to the pharmacy team for assistance with medication management. The Population Health team is pleased to engage with this patient at any time in the future upon receipt of referral and should he/she be interested in assistance from the Population Health team.  Thedford Franks, CMA [facility]  [facility], [facility] Guide Direct Dial: [phone]  Fax: [phone] Website: [email or web address]

## 2023-10-08 ENCOUNTER — Other Ambulatory Visit: Payer: Self-pay | Admitting: Internal Medicine

## 2023-10-08 DIAGNOSIS — G2581 Restless legs syndrome: Secondary | ICD-10-CM

## 2023-10-08 DIAGNOSIS — E0840 Diabetes mellitus due to underlying condition with diabetic neuropathy, unspecified: Secondary | ICD-10-CM | POA: Insufficient documentation

## 2023-10-08 DIAGNOSIS — E1140 Type 2 diabetes mellitus with diabetic neuropathy, unspecified: Secondary | ICD-10-CM | POA: Insufficient documentation

## 2023-10-08 MED ORDER — PREGABALIN 25 MG PO CAPS: 25.0000 mg | ORAL_CAPSULE | Freq: Three times a day (TID) | ORAL | 0 refills | Status: AC

## 2023-10-08 NOTE — Telephone Encounter (Signed)
 Patient has been made aware that this medication has been sent correctly.

## 2023-10-08 NOTE — Telephone Encounter (Signed)
 Unable to reach patient. LMTRC

## 2023-10-08 NOTE — Telephone Encounter (Signed)
**Note De-identified  [place] Obfuscation** Please advise 

## 2023-10-08 NOTE — Telephone Encounter (Signed)
 Start lyrica  RX sent

## 2023-10-08 NOTE — Telephone Encounter (Signed)
 Patient has been made aware.

## 2023-10-12 ENCOUNTER — Other Ambulatory Visit (HOSPITAL_COMMUNITY): Payer: Self-pay

## 2023-10-12 ENCOUNTER — Telehealth: Payer: Self-pay

## 2023-10-12 NOTE — Telephone Encounter (Signed)
 Pharmacy Patient Advocate Encounter  Received notification from [facility] that Prior Authorization for Pregabalin  25MG  capsules  has been APPROVED from 01/27/23 to 10/11/24. Ran test claim, Copay is $34.16. This test claim was processed through [facility]- copay amounts may vary at other pharmacies due to pharmacy/plan contracts, or as the patient moves through the different stages of their insurance plan.   PA #/Case ID/Reference #: [id number]

## 2023-10-13 ENCOUNTER — Other Ambulatory Visit (HOSPITAL_COMMUNITY): Payer: Self-pay

## 2023-10-14 NOTE — Telephone Encounter (Signed)
 Patient has been made aware and gave a verbal understanding.

## 2023-11-29 ENCOUNTER — Other Ambulatory Visit: Payer: Self-pay | Admitting: Internal Medicine

## 2023-11-29 DIAGNOSIS — N1831 Chronic kidney disease, stage 3a: Secondary | ICD-10-CM

## 2023-11-29 DIAGNOSIS — E118 Type 2 diabetes mellitus with unspecified complications: Secondary | ICD-10-CM

## 2023-11-29 DIAGNOSIS — E039 Hypothyroidism, unspecified: Secondary | ICD-10-CM

## 2023-11-29 NOTE — Telephone Encounter (Unsigned)
 Copied from CRM [id number]. Topic: Clinical - Medication Refill >> Nov 29, 2023  1:13 PM [name] wrote: Medication: levothyroxine  levothyroxine  (SYNTHROID ) 125 MCG tablet   Has the patient contacted their pharmacy? Yes (Agent: If no, request that the patient contact the pharmacy for the refill. If patient does not wish to contact the pharmacy document the reason why and proceed with request.) (Agent: If yes, when and what did the pharmacy advise?)  This is the patient's preferred pharmacy:  [facility] [place], [place] - [address] [place] [address] Phone: [phone] Fax: [phone]  Is this the correct pharmacy for this prescription? Yes If no, delete pharmacy and type the correct one.   Has the prescription been filled recently? No  Is the patient out of the medication? No  Has the patient been seen for an appointment in the last year OR does the patient have an upcoming appointment? Yes  Can we respond through MyChart? Yes  Agent: Please be advised that Rx refills may take up to 3 business days. We ask that you follow-up with your pharmacy.

## 2023-11-29 NOTE — Telephone Encounter (Unsigned)
 Copied from CRM [id number]. Topic: Clinical - Medication Refill >> Nov 29, 2023  1:16 PM Zy'onna H wrote: Medication: dapagliflozin  propanediol dapagliflozin  propanediol (FARXIGA ) 10 MG TABS tablet   Has the patient contacted their pharmacy? No (Agent: If no, request that the patient contact the pharmacy for the refill. If patient does not wish to contact the pharmacy document the reason why and proceed with request.) (Agent: If yes, when and what did the pharmacy advise?)  This is the patient's preferred pharmacy:  [id number] - [place], [place] - [address] [place] [address] Phone: [phone] Fax: [phone]  [facility] - [place], [place] - [address] [place] [address] Phone: [phone] Fax: [phone]  [facility] [place], [place] - [address] [place] [address] Phone: [phone] Fax: [phone]  [id number] [place], [place] - [address] [facility] [address] [place] [address] Phone: [phone] Fax: [phone]  Is this the correct pharmacy for this prescription? Yes If no, delete pharmacy and type the correct one.   Has the prescription been filled recently? No  Is the patient out of the medication? No  Has the patient been seen for an appointment in the last year OR does the patient have an upcoming appointment? Yes  Can we respond through MyChart? Yes  Agent: Please be advised that Rx refills may take up to 3 business days. We ask that you follow-up with your pharmacy.

## 2023-12-03 MED ORDER — DAPAGLIFLOZIN PROPANEDIOL 10 MG PO TABS: 10.0000 mg | ORAL_TABLET | Freq: Every day | ORAL | 1 refills | Status: AC

## 2023-12-03 MED ORDER — LEVOTHYROXINE SODIUM 125 MCG PO TABS: 125.0000 ug | ORAL_TABLET | Freq: Every day | ORAL | 1 refills | Status: DC

## 2023-12-07 ENCOUNTER — Ambulatory Visit: Admitting: Cardiovascular Disease

## 2023-12-09 DIAGNOSIS — N393 Stress incontinence (female) (male): Secondary | ICD-10-CM | POA: Diagnosis not present

## 2023-12-09 DIAGNOSIS — R32 Unspecified urinary incontinence: Secondary | ICD-10-CM | POA: Diagnosis not present

## 2023-12-09 DIAGNOSIS — N3941 Urge incontinence: Secondary | ICD-10-CM | POA: Diagnosis not present

## 2023-12-10 ENCOUNTER — Other Ambulatory Visit: Payer: Self-pay | Admitting: Internal Medicine

## 2023-12-10 DIAGNOSIS — I10 Essential (primary) hypertension: Secondary | ICD-10-CM

## 2023-12-13 ENCOUNTER — Encounter (HOSPITAL_BASED_OUTPATIENT_CLINIC_OR_DEPARTMENT_OTHER): Payer: Self-pay | Admitting: Pulmonary Disease

## 2023-12-13 ENCOUNTER — Ambulatory Visit (INDEPENDENT_AMBULATORY_CARE_PROVIDER_SITE_OTHER): Admitting: Pulmonary Disease

## 2023-12-13 VITALS — BP 134/80 | HR 81 | Temp 98.7°F | Ht 59.0 in | Wt 179.3 lb

## 2023-12-13 DIAGNOSIS — E669 Obesity, unspecified: Secondary | ICD-10-CM

## 2023-12-13 DIAGNOSIS — G4700 Insomnia, unspecified: Secondary | ICD-10-CM

## 2023-12-13 DIAGNOSIS — G2581 Restless legs syndrome: Secondary | ICD-10-CM | POA: Diagnosis not present

## 2023-12-13 DIAGNOSIS — G4733 Obstructive sleep apnea (adult) (pediatric): Secondary | ICD-10-CM

## 2023-12-13 NOTE — Patient Instructions (Addendum)
 X blood work for iron  levels  X Home sleep test  X weight loss of 5-10 lbs  X trial of melatonin 5-10 mg 1h prior to  bedtime    VISIT SUMMARY: During your visit, we discussed your severe obstructive sleep apnea, restless legs syndrome, chronic insomnia, and the impact of your weight on your sleep apnea. We reviewed your current treatments and explored alternative options to improve your sleep quality and overall health.  YOUR PLAN: -SEVERE OBSTRUCTIVE SLEEP APNEA: Severe obstructive sleep apnea is a condition where your airway becomes blocked during sleep, causing frequent interruptions in breathing. You experience about 40 apneic events per hour. Since you are unable to tolerate CPAP therapy, we discussed the possibility of Inspire therapy, which involves a surgical implant to help keep your airway open. To be eligible, you need to lose 5-10 pounds. We have ordered a home sleep test to update your sleep study results and will plan an endoscopy to assess your airway anatomy if you meet the criteria for Inspire therapy.  -RESTLESS LEGS SYNDROME WITH POSSIBLE MEDICATION AUGMENTATION: Restless legs syndrome causes uncomfortable sensations in your legs, leading to an urge to move them, which disrupts your sleep. Your current medication, ropinirole , may be less effective due to long-term use. We will transition you to pregabalin  (Lyrica ) and check your iron  levels, as low iron  can worsen symptoms. You should gradually taper off ropinirole  while starting pregabalin . Additionally, melatonin may help improve your sleep.  -CHRONIC MAINTENANCE INSOMNIA: Chronic maintenance insomnia is characterized by difficulty staying asleep and waking up too early. This condition has been affecting you for over ten years. We suggest trying melatonin, an over-the-counter supplement, one hour before bedtime to help improve your sleep.  -OVERWEIGHT CONTRIBUTING TO SLEEP APNEA SEVERITY: Being overweight can worsen sleep  apnea. Your current BMI is 36, which is slightly above the criteria for Inspire therapy. We recommend losing 5-10 pounds to meet the eligibility criteria for this treatment and to potentially reduce the severity of your sleep apnea.  INSTRUCTIONS: Please follow up with the home sleep test as ordered. Gradually taper off ropinirole  while starting pregabalin  (Lyrica ) as directed. Have your iron  levels checked as ordered. Try taking melatonin one hour before bedtime. Work on losing 5-10 pounds to meet the BMI criteria for Inspire therapy. We will plan an endoscopy to assess your airway anatomy if you meet the criteria for Inspire therapy.                      Contains text generated by Abridge.                                 Contains text generated by Abridge.

## 2023-12-13 NOTE — Progress Notes (Signed)
 New Patient Pulmonology Office Visit   Subjective:  Patient ID: Helen  Lin, female    DOB: Jul 19, 1950  MRN: [id number]  Referred by: Joshua Debby CROME, MD  CC: No chief complaint on file.   Establish care for severe OSA & RLS mild pulmonary HTN noted at cath  Long standing DOE , EF normal on TTE 07/2021 no signs of cor pulmonale V/Q negative and no significant valve ds    Discussed the use of AI scribe software for clinical note transcription with the patient, who gave verbal consent to proceed.  History of Present Illness Helen  Lin is a 73 year old female with severe obstructive sleep apnea who presents with sleep disturbances and intolerance to CPAP therapy. She was referred by Dr. Joshua or Dr. Delford for evaluation of her inability to use CPAP therapy.  She was diagnosed with severe obstructive sleep apnea through a sleep study showing approximately forty apneic events per hour. Despite having a CPAP machine, she is unable to tolerate its use, and the machine remains unused. She experiences significant sleep disturbances, going to bed around 11 PM and waking by 1:30 or 2 AM, often remaining awake for the rest of the night. Her sleep pattern includes waking seven to eight times nightly and rising by 5 AM. She does not nap during the day.  She suffers from restless leg syndrome, with aching legs that disrupt her sleep. She takes ropinirole , one to two tablets at noon, then at 5 PM, and three tablets at night, but it has become less effective x 10 y Her medical history includes diabetes managed with metformin , hypertension, and hypothyroidism treated with a thyroid  pill. She takes medication for cholesterol and denies insulin  use.  She works as a engineer, structural with irregular hours, which may impact her sleep schedule. She reports snoring and significant apnea events from her sleep study. She has gained twenty pounds over the last two years. No iron  supplements are taken, and she is  unsure about recent iron  level checks.  ESS 14   Significant tests/ events reviewed  CTA chest 05/2022 neg  [place] 08/2021 >> severe, AHI 41/h, mild centrals pAHIc 11/h, low sat 79% 8/20223 CPAP >> 16 cm  PFTs 06/2023 normal  ROS  Constitutional: negative for anorexia, fevers and sweats  Eyes: negative for irritation, redness and visual disturbance  Ears, nose, mouth, throat, and face: negative for earaches, epistaxis, nasal congestion and sore throat  Respiratory: negative for cough, dyspnea on exertion, sputum and wheezing  Cardiovascular: negative for chest pain, dyspnea, lower extremity edema, orthopnea, palpitations and syncope  Gastrointestinal: negative for abdominal pain, constipation, diarrhea, melena, nausea and vomiting  Genitourinary:negative for dysuria, frequency and hematuria  Hematologic/lymphatic: negative for bleeding, easy bruising and lymphadenopathy  Musculoskeletal:negative for arthralgias, muscle weakness and stiff joints  Neurological: negative for coordination problems, gait problems, headaches and weakness  Endocrine: negative for diabetic symptoms including polydipsia, polyuria and weight loss   Allergies: Hydralazine , Ciprofloxacin , Propofol , and Ritalin [methylphenidate hcl]  Current Outpatient Medications:    acetaminophen  (TYLENOL ) 500 MG tablet, Take 1,000 mg by mouth 3 (three) times daily., Disp: , Rfl:    aspirin  EC 81 MG tablet, Take 1 tablet (81 mg total) by mouth daily. Swallow whole., Disp: , Rfl:    Blood Glucose Monitoring Suppl (FREESTYLE LITE) w/Device KIT, USE TWO TIMES A DAY TO TEST GLUCOSE CONTROL, Disp: 1 kit, Rfl: 0   Cholecalciferol (VITAMIN D3) 50 MCG (2000 UT) capsule, TAKE 1 CAPSULE BY MOUTH DAILY,  Disp: 90 capsule, Rfl: 0   dapagliflozin  propanediol (FARXIGA ) 10 MG TABS tablet, Take 1 tablet (10 mg total) by mouth daily before breakfast., Disp: 90 tablet, Rfl: 1   ezetimibe  (ZETIA ) 10 MG tablet, Take 1 tablet (10 mg total) by  mouth daily., Disp: 90 tablet, Rfl: 1   fluocinonide -emollient (LIDEX -E) 0.05 % cream, Apply 1 Application topically 2 (two) times daily., Disp: 60 g, Rfl: 1   FREESTYLE LITE test strip, USE 1 STRIP TO TEST TWICE A DAY, Disp: 50 strip, Rfl: 0   GEMTESA 75 MG TABS, Take 75 mg by mouth daily., Disp: , Rfl:    Insulin  Pen Needle 32G X 6 MM MISC, 1 Act by Does not apply route once a week., Disp: 30 each, Rfl: 0   levocetirizine (XYZAL ) 5 MG tablet, Take 1 tablet (5 mg total) by mouth every evening., Disp: 90 tablet, Rfl: 1   levothyroxine  (SYNTHROID ) 125 MCG tablet, Take 1 tablet (125 mcg total) by mouth daily., Disp: 90 tablet, Rfl: 1   metFORMIN  (GLUCOPHAGE -XR) 750 MG 24 hr tablet, Take 1 tablet (750 mg total) by mouth daily with breakfast., Disp: 90 tablet, Rfl: 1   nitroGLYCERIN  (NITROSTAT ) 0.4 MG SL tablet, Place 1 tablet (0.4 mg total) under the tongue every 5 (five) minutes as needed for chest pain., Disp: 25 tablet, Rfl: 3   olmesartan  (BENICAR ) 20 MG tablet, TAKE 1 TABLET BY MOUTH DAILY, Disp: 90 tablet, Rfl: 0   pantoprazole  (PROTONIX ) 40 MG tablet, TAKE 1 TABLET BY MOUTH DAILY, Disp: 90 tablet, Rfl: 0   pregabalin  (LYRICA ) 25 MG capsule, Take 1 capsule (25 mg total) by mouth 3 (three) times daily., Disp: 270 capsule, Rfl: 0   rOPINIRole  (REQUIP ) 0.5 MG tablet, TAKE 1 TO 2 TABLETS BY MOUTH AT NOON AND 5PM THEN TAKE 3 TABLETS BY MOUTH EVERY NIGHT AT BEDTIME, Disp: 360 tablet, Rfl: 0   rosuvastatin  (CRESTOR ) 10 MG tablet, Take 1 tablet (10 mg total) by mouth daily., Disp: 90 tablet, Rfl: 1 Past Medical History:  Diagnosis Date   Acute pyelonephritis 05/02/2012   Asthma    Diverticulitis 03/24/2017   see report [facility] [place] [facility]   Dyspnea    nl PFTs, and echo   GERD (gastroesophageal reflux disease)    Headache(784.0)    Heart murmur    History of echocardiogram    Echocardiogram 7/23: EF 65-70, no RWMA, Gr 1 DD, normal RVSF, inadequate TR signal to est PASP, trivial MR, AV  sclerosis w/o AS   History of kidney stones    IBS (irritable bowel syndrome)    Nephrolithiasis    Pre-diabetes 12/2013   Recurrent oral ulcers    Thyroid  goiter    I 131 rx and then  total throidectomy 2005 [facility]   Past Surgical History:  Procedure Laterality Date   ABDOMINAL HYSTERECTOMY  1994   fibroid   APPENDECTOMY  1968   CYSTOSCOPY WITH STENT PLACEMENT Bilateral 03/24/2017   Procedure: BILATERAL URETERAL CATHETER  PLACEMENT;  Surgeon: Alvaro Hummer, MD;  Location: [facility];  Service: Urology;  Laterality: Bilateral;   CYSTOSCOPY/RETROGRADE/URETEROSCOPY  03/24/2017   Procedure: CYSTOSCOPY/RETROGRADE/URETEROSCOPY;  Surgeon: Alvaro Hummer, MD;  Location: [facility];  Service: Urology;;   LAPAROSCOPIC PARTIAL COLECTOMY Left 03/24/2017   Procedure: LAPAROSCOPIC ASSISTED LEFT  SIGMOID COLECTOMY;  Surgeon: Ethyl Lenis, MD;  Location: [facility];  Service: General;  Laterality: Left;   orif left tivial plateau fracture     Dr. Carlos 2/08   plates and pins take  out of left knee  06/2008   RIGHT/LEFT HEART CATH AND CORONARY ANGIOGRAPHY N/A 07/24/2021   Procedure: RIGHT/LEFT HEART CATH AND CORONARY ANGIOGRAPHY;  Surgeon: Burnard Debby LABOR, MD;  Location: [facility];  Service: Cardiovascular;  Laterality: N/A;   SIGMOIDOSCOPY  03/24/2017   Procedure: SIGMOIDOSCOPY;  Surgeon: Ethyl Lenis, MD;  Location: [facility];  Service: General;;   TONSILLECTOMY  1979   TOTAL THYROIDECTOMY  2005   for  large nodule 2006   Family History  Problem Relation Age of Onset   COPD Sister    Coronary artery disease Sister        age 21 also copd   Cancer Mother        bladder   Arthritis Other    Cancer Other        breast   Diabetes Other    Hyperlipidemia Other    Hypertension Other    Stroke Other    Heart disease Other    Coronary artery disease Father        also had AAA   Aneurysm Father        Aortic and thoracic fatal   Social History   Socioeconomic History   Marital status:  Married    Spouse name: Sharolyn   Number of children: 4   Years of education: Not on file   Highest education level: Not on file  Occupational History   Occupation: private duty/part time  Tobacco Use   Smoking status: Never   Smokeless tobacco: Never  Vaping Use   Vaping status: Never Used  Substance and Sexual Activity   Alcohol use: Yes    Comment: seldom   Drug use: No   Sexual activity: Not on file  Other Topics Concern   Not on file  Social History Narrative   Married   Has grandchildren   Never smoked    G4P4   hh of 2  care of GC ages 17 - 6 month  5 days a week   Social Drivers of Corporate Investment Banker Strain: Patient Declined (12/06/2023)   Received from [employer]   Overall Financial Resource Strain (CARDIA)    How hard is it for you to pay for the very basics like food, housing, medical care, and heating?: Patient declined  Food Insecurity: Patient Declined (12/06/2023)   Received from [facility]   Hunger Vital Sign    Within the past 12 months, you worried that your food would run out before you got the money to buy more.: Patient declined    Within the past 12 months, the food you bought just didn't last and you didn't have money to get more.: Patient declined  Transportation Needs: Patient Declined (12/06/2023)   Received from [facility] - Transportation    In the past 12 months, has lack of transportation kept you from medical appointments or from getting medications?: Patient declined    In the past 12 months, has lack of transportation kept you from meetings, work, or from getting things needed for daily living?: Patient declined  Physical Activity: Unknown (12/06/2023)   Received from [facility]   Exercise Vital Sign    On average, how many days per week do you engage in moderate to strenuous exercise (like a brisk walk)?: 7 days    On average, how many minutes do you engage in exercise at this level?: Patient declined   Stress: Patient Declined (12/06/2023)   Received  from [facility] Occupational [facility] - Occupational Stress Questionnaire    Do you feel stress - tense, restless, nervous, or anxious, or unable to sleep at night because your mind is troubled all the time - these days?: Patient declined  Social Connections: Patient Declined (12/06/2023)   Received from [facility]   Social Network    How would you rate your social network (family, work, friends)?: Patient declined  Intimate Partner Violence: Not At Risk (12/06/2023)   Received from [facility]   HITS    Over the last 12 months how often did your partner physically hurt you?: Never    Over the last 12 months how often did your partner insult you or talk down to you?: Patient declined    Over the last 12 months how often did your partner threaten you with physical harm?: Patient declined    Over the last 12 months how often did your partner scream or curse at you?: Patient declined       Objective:  There were no vitals taken for this visit.   Physical Exam  Gen. Pleasant, obese, in no distress ENT - no lesions, no post nasal drip Neck: No JVD, no thyromegaly, no carotid bruits Lungs: no use of accessory muscles, no dullness to percussion, decreased without rales or rhonchi  Cardiovascular: Rhythm regular, heart sounds  normal, no murmurs or gallops, no peripheral edema Musculoskeletal: No deformities, no cyanosis or clubbing , no tremors       Assessment & Plan:  Assessment and Plan Assessment & Plan Severe obstructive sleep apnea 40 apneic events per hour. CPAP intolerance due to mask discomfort. Interested in [place] therapy. Requires updated sleep study and weight criteria for eligibility. Inspire involves surgical implantation with potential benefits of improved sleep quality, but not guaranteed to resolve insomnia or restless legs. - Ordered home sleep test to update sleep study results -  Advised weight loss of 5-10 pounds to meet BMI criteria for Inspire eligibility - Discussed Inspire therapy as an alternative to CPAP - Will plan endoscopy to assess airway anatomy if Inspire criteria are met  Restless legs syndrome with possible medication augmentation Long-standing restless legs syndrome with possible augmentation due to prolonged ropinirole  use. Symptoms include leg aches and difficulty sitting still. Transitioning to pregabalin  (Lyrica ) for management. Iron  levels to be checked as low iron  can exacerbate symptoms. - Advised tapering off ropinirole  gradually while starting pregabalin  (Lyrica ) - Ordered blood work to check iron  levels - Suggested trying melatonin for sleep improvement  Chronic maintenance insomnia Difficulty maintaining sleep, waking up multiple times per night, and early morning awakenings. Insomnia has persisted for over ten years. Melatonin suggested as a safe over-the-counter option to aid sleep. - Suggested trying melatonin one hour before bedtime  Overweight contributing to sleep apnea severity BMI of 36, slightly above the criteria for Inspire therapy. Weight loss recommended to improve eligibility for treatment and potentially reduce sleep apnea severity. - Advised weight loss of 5-10 pounds to meet BMI criteria for Inspire eligibility       No follow-ups on file.   Harden ROCKFORD Jude, MD

## 2023-12-14 LAB — IRON,TIBC AND FERRITIN PANEL
Ferritin: 273 ng/mL — ABNORMAL HIGH (ref 15–150)
Iron Saturation: 21 % (ref 15–55)
Iron: 76 ug/dL (ref 27–139)
Total Iron Binding Capacity: 360 ug/dL (ref 250–450)
UIBC: 284 ug/dL (ref 118–369)

## 2023-12-15 ENCOUNTER — Other Ambulatory Visit: Payer: Self-pay | Admitting: Internal Medicine

## 2023-12-15 ENCOUNTER — Ambulatory Visit (HOSPITAL_BASED_OUTPATIENT_CLINIC_OR_DEPARTMENT_OTHER): Payer: Self-pay | Admitting: Pulmonary Disease

## 2023-12-15 NOTE — Progress Notes (Signed)
 Spoke with Pt regarding iron  levels informed her that provider stated iron  levels were ok.Pt expressed concern that Ferritin of 273 was abnormal.

## 2023-12-16 ENCOUNTER — Telehealth: Payer: Self-pay

## 2023-12-16 NOTE — Telephone Encounter (Signed)
 Copied from CRM #[id number]. Topic: Clinical - Lab/Test Results >> Dec 16, 2023  9:10 AM [name] wrote: Reason for CRM: Pt requested a call back from Dr. Joshua team at [phone] regarding recent iron  test results.

## 2023-12-17 NOTE — Telephone Encounter (Signed)
 Patient is very concerned about her lab results from the pulmonologist they advised her that her labs were OK but her ferritin level is 273 and she is very concerned about this. Her legs are bothering her so bad she have to pull over on the side of thee road when she's driving, she also can't sit for long periods of time without having to stand, she is only sleeping for about 2 hours a night. None of the medication is helping her at all. With her labs being so high she feels like that is contributing to her leg issues. Also her daughter and her niece work in the medical field and they have told her that she could possibly  Unalive from her labs being so high. Please help, I have scheduled her for an appointment Tuesday afternoon!

## 2023-12-19 ENCOUNTER — Other Ambulatory Visit: Payer: Self-pay | Admitting: Internal Medicine

## 2023-12-19 DIAGNOSIS — G2581 Restless legs syndrome: Secondary | ICD-10-CM

## 2023-12-21 ENCOUNTER — Encounter: Payer: Self-pay | Admitting: Internal Medicine

## 2023-12-21 ENCOUNTER — Ambulatory Visit: Admitting: Internal Medicine

## 2023-12-21 ENCOUNTER — Ambulatory Visit (INDEPENDENT_AMBULATORY_CARE_PROVIDER_SITE_OTHER)

## 2023-12-21 VITALS — BP 130/78 | HR 73 | Temp 98.6°F | Resp 16 | Ht 59.0 in | Wt 177.8 lb

## 2023-12-21 DIAGNOSIS — E0840 Diabetes mellitus due to underlying condition with diabetic neuropathy, unspecified: Secondary | ICD-10-CM | POA: Diagnosis not present

## 2023-12-21 DIAGNOSIS — E118 Type 2 diabetes mellitus with unspecified complications: Secondary | ICD-10-CM

## 2023-12-21 DIAGNOSIS — K760 Fatty (change of) liver, not elsewhere classified: Secondary | ICD-10-CM

## 2023-12-21 DIAGNOSIS — M47812 Spondylosis without myelopathy or radiculopathy, cervical region: Secondary | ICD-10-CM | POA: Diagnosis not present

## 2023-12-21 DIAGNOSIS — M5030 Other cervical disc degeneration, unspecified cervical region: Secondary | ICD-10-CM

## 2023-12-21 DIAGNOSIS — Z7985 Long-term (current) use of injectable non-insulin antidiabetic drugs: Secondary | ICD-10-CM | POA: Diagnosis not present

## 2023-12-21 DIAGNOSIS — M5412 Radiculopathy, cervical region: Secondary | ICD-10-CM

## 2023-12-21 DIAGNOSIS — G2581 Restless legs syndrome: Secondary | ICD-10-CM

## 2023-12-21 DIAGNOSIS — E039 Hypothyroidism, unspecified: Secondary | ICD-10-CM

## 2023-12-21 DIAGNOSIS — M4802 Spinal stenosis, cervical region: Secondary | ICD-10-CM | POA: Diagnosis not present

## 2023-12-21 DIAGNOSIS — M79601 Pain in right arm: Secondary | ICD-10-CM | POA: Diagnosis not present

## 2023-12-21 MED ORDER — LEVOTHYROXINE SODIUM 125 MCG PO TABS: 125.0000 ug | ORAL_TABLET | Freq: Every day | ORAL | 0 refills | Status: DC

## 2023-12-21 MED ORDER — METHYLPREDNISOLONE 4 MG PO TBPK: ORAL_TABLET | ORAL | 0 refills | Status: AC

## 2023-12-21 MED ORDER — OXYCODONE HCL 5 MG PO TABS: 5.0000 mg | ORAL_TABLET | Freq: Three times a day (TID) | ORAL | 0 refills | Status: AC | PRN

## 2023-12-21 MED ORDER — ROPINIROLE HCL 1 MG PO TABS: 1.0000 mg | ORAL_TABLET | Freq: Three times a day (TID) | ORAL | 0 refills | Status: AC

## 2023-12-21 NOTE — Patient Instructions (Signed)
 Breakdown of Disks in the Spine (Degenerative Disk Disease): What to Know  Degenerative disk disease happens because of changes that affect the disks in your spine as you get older. These soft disks are located between the bones of your spine. They act like shock absorbers. Degenerative disk disease can affect the whole spine. But it's most common in the neck and lower back. As you age, many changes can occur in the spinal disks, such as: Disks may dry out and shrink. Small tears may occur in the tough outer covering of a disk. The disk space may become smaller due to loss of water. Abnormal growths in the bone (spurs) may occur. This can put pressure on the nerve roots leaving the spinal canal, causing pain. The spinal canal may become narrow. What are the causes? Degenerative disk disease may be caused by: Normal wear and tear as you age. Injuries. Certain activities or sports that cause damage. What increases the risk? Being overweight. Having a family history of degenerative disk disease. Smoking or using nicotine or tobacco. Sudden injury. Doing work that requires heavy lifting. What are the signs or symptoms? Symptoms include: Pain that varies in intensity. Some people have no pain, while others have severe pain. The location of the pain depends on the part of your spine that's affected. You may have: Pain in your neck or arm if a disk in your neck area is affected. Pain in your back, butt, or legs if a disk in your lower back is affected. Pain that gets worse while bending, reaching up, or doing twisting movements. Pain that may start slowly and get worse as time passes. It may also start after a major or minor injury. Numbness or tingling in the arms or legs. How is this diagnosed? Degenerative disk disease may be diagnosed based on: Your symptoms and medical history. A physical exam. Imaging tests, including: X-ray of the spine. CT scan. MRI. How is this  treated? Treatment may include: Medicines. Injection of steroids into the back. Rehab exercises. These activities aim to strengthen muscles in your back and belly to better support your spine. If treatments don't help to relieve your symptoms or if you have severe pain, you may need surgery. Follow these instructions at home: Medicines Take your medicines only as told. You may need to take steps to help treat or prevent trouble pooping (constipation), such as: Taking medicines to help you poop. Eating foods high in fiber, like beans, whole grains, and fresh fruits and vegetables. Drinking more fluids as told. Ask your health care provider if it's safe to drive or use machines while taking your medicine. Activity Rest as told. Get up to take short walks many times during the day. This helps you breathe better and keeps your blood flowing. Ask for help if you feel weak or unsteady. Ask what things are safe for you to do at home. Ask when you can go back to work or school. Do relaxation exercises as told by your provider. Maintain good posture. Follow proper lifting and walking techniques as told. Managing pain, stiffness, and swelling     Use ice or an ice pack as told. Icing can help to relieve pain. Place a towel between your skin and the ice. Leave the ice on for 20 minutes, 2-3 times a day. Use heat as told. Heat can reduce the stiffness of your muscles. Use the heat source that your provider recommends, such as a moist heat pack or a heating pad. Do  this as often as told. Place a towel between your skin and the heat source. Leave the heat on for 20-30 minutes. If your skin turns red, take off the ice or heat right away to prevent skin damage. The risk of damage is higher if you can't feel pain, heat, or cold. General instructions Change your sitting, standing, and sleeping habits as told by your provider. Avoid sitting in the same position for long periods of time. Change  positions often. Lose weight or stay at a healthy weight as told. Do not smoke, vape, or use nicotine or tobacco. Wear supportive footwear. Keep all follow-up visits. This may include visits for physical therapy. Contact a health care provider if: You have pain that doesn't go away within 1-4 weeks. You lose your appetite. You lose weight without trying. You have fevers or night sweats. Get help right away if: You have severe pain. You have weakness in your arms, hands, or legs. You start to lose control of when you pee or poop. This information is not intended to replace advice given to you by your health care provider. Make sure you discuss any questions you have with your health care provider. Document Revised: 02/04/2023 Document Reviewed: 02/04/2023 Elsevier Patient Education  2025 [employer].

## 2023-12-21 NOTE — Progress Notes (Signed)
 Subjective:  Patient ID: [name], female    DOB: 1950-06-08  Age: 73 y.o. MRN: [id number]  CC: Medical Management of Chronic Issues (Discuss lab results. And has other complaints to discuss. )   HPI Helen  Lin presents for f/up ---  Discussed the use of AI scribe software for clinical note transcription with the patient, who gave verbal consent to proceed.  History of Present Illness Helen  Lin is a 73 year old female who presents with widespread musculoskeletal pain and restless legs.  She has been experiencing widespread musculoskeletal pain for about a month, described as aching and affecting her ability to sleep, sit still, and perform daily activities. She sleeps only two to three hours a night and had to leave work due to the severity of the pain. Despite taking Lyrica , ropinirole , and six Tylenols a day, she continues to experience significant discomfort, particularly in her legs.  She reports numbness and tingling in her right forearm, with pain radiating from her neck down her arm, present for almost a month. There is no history of recent neck injury. She reports some clumsiness in her arms and legs.  Her ferritin level was elevated at 273, although her iron  levels were normal. She has a family history of heart issues in her oldest daughter, who also had elevated iron  levels at one point.  No nausea, vomiting, or bowel movement issues, but she notes some stomach bloating. She has not had a recent eye exam, with the last one being in April 2024.   Outpatient Medications Prior to Visit  Medication Sig Dispense Refill   acetaminophen  (TYLENOL ) 500 MG tablet Take 1,000 mg by mouth 3 (three) times daily.     aspirin  EC 81 MG tablet Take 1 tablet (81 mg total) by mouth daily. Swallow whole.     Blood Glucose Monitoring Suppl (FREESTYLE LITE) w/Device KIT USE TWO TIMES A DAY TO TEST GLUCOSE CONTROL 1 kit 0   Cholecalciferol (VITAMIN D3) 50 MCG (2000 UT) capsule  TAKE 1 CAPSULE BY MOUTH DAILY 90 capsule 0   dapagliflozin  propanediol (FARXIGA ) 10 MG TABS tablet Take 1 tablet (10 mg total) by mouth daily before breakfast. 90 tablet 1   ezetimibe  (ZETIA ) 10 MG tablet Take 1 tablet (10 mg total) by mouth daily. 90 tablet 1   FREESTYLE LITE test strip USE 1 STRIP TO TEST TWICE A DAY 50 strip 0   metFORMIN  (GLUCOPHAGE -XR) 750 MG 24 hr tablet Take 1 tablet (750 mg total) by mouth daily with breakfast. 90 tablet 1   nitroGLYCERIN  (NITROSTAT ) 0.4 MG SL tablet Place 1 tablet (0.4 mg total) under the tongue every 5 (five) minutes as needed for chest pain. 25 tablet 3   olmesartan  (BENICAR ) 20 MG tablet TAKE 1 TABLET BY MOUTH DAILY 90 tablet 0   pantoprazole  (PROTONIX ) 40 MG tablet TAKE 1 TABLET BY MOUTH DAILY 90 tablet 0   rosuvastatin  (CRESTOR ) 10 MG tablet Take 1 tablet (10 mg total) by mouth daily. 90 tablet 1   levothyroxine  (SYNTHROID ) 125 MCG tablet Take 1 tablet (125 mcg total) by mouth daily. 90 tablet 1   rOPINIRole  (REQUIP ) 0.5 MG tablet TAKE 1 TO 2 TABLETS BY MOUTH AT NOON AND 5PM THEN TAKE 3 TABLETS BY MOUTH EVERY NIGHT AT BEDTIME 360 tablet 0   fluocinonide -emollient (LIDEX -E) 0.05 % cream Apply 1 Application topically 2 (two) times daily. (Patient not taking: Reported on 12/22/2023) 60 g 1   GEMTESA 75 MG TABS Take 75 mg by mouth  daily. (Patient not taking: Reported on 12/22/2023)     levocetirizine (XYZAL ) 5 MG tablet Take 1 tablet (5 mg total) by mouth every evening. (Patient not taking: Reported on 12/22/2023) 90 tablet 1   pregabalin  (LYRICA ) 25 MG capsule Take 1 capsule (25 mg total) by mouth 3 (three) times daily. 270 capsule 0   Insulin  Pen Needle 32G X 6 MM MISC 1 Act by Does not apply route once a week. 30 each 0   No facility-administered medications prior to visit.    ROS Review of Systems  Constitutional:  Negative for appetite change, chills, diaphoresis, fatigue and fever.  HENT: Negative.  Negative for trouble swallowing.   Eyes:   Negative for visual disturbance.  Respiratory:  Negative for cough, chest tightness, shortness of breath and wheezing.   Cardiovascular:  Negative for chest pain, palpitations and leg swelling.  Gastrointestinal: Negative.  Negative for abdominal pain, blood in stool, constipation, diarrhea, nausea and vomiting.  Genitourinary: Negative.  Negative for difficulty urinating.  Musculoskeletal:  Positive for arthralgias and neck pain. Negative for joint swelling, myalgias and neck stiffness.  Skin: Negative.   Neurological:  Positive for weakness and numbness. Negative for dizziness and light-headedness.  Hematological:  Negative for adenopathy. Does not bruise/bleed easily.  Psychiatric/Behavioral:  Positive for sleep disturbance. Negative for confusion, decreased concentration and dysphoric mood. The patient is not nervous/anxious.     Objective:  BP 130/78 (BP Location: Right Arm, Patient Position: Sitting, Cuff Size: Normal) Comment: BP (L) 130/80  Pulse 73   Temp 98.6 F (37 C) (Oral)   Resp 16   Ht 4' 11 (1.499 m)   Wt 177 lb 12.8 oz (80.6 kg)   SpO2 93%   BMI 35.91 kg/m   BP Readings from Last 3 Encounters:  12/22/23 110/62  12/21/23 130/78  12/13/23 134/80    Wt Readings from Last 3 Encounters:  12/22/23 177 lb 6.4 oz (80.5 kg)  12/21/23 177 lb 12.8 oz (80.6 kg)  12/13/23 179 lb 4.8 oz (81.3 kg)    Physical Exam Vitals reviewed.  Constitutional:      Appearance: Normal appearance.  HENT:     Mouth/Throat:     Mouth: Mucous membranes are moist.  Eyes:     General: No scleral icterus.    Conjunctiva/sclera: Conjunctivae normal.  Cardiovascular:     Rate and Rhythm: Normal rate and regular rhythm.     Heart sounds: No murmur heard.    No friction rub. No gallop.  Pulmonary:     Effort: Pulmonary effort is normal.     Breath sounds: No stridor. No wheezing, rhonchi or rales.  Abdominal:     General: Abdomen is flat.     Palpations: There is no mass.      Tenderness: There is no abdominal tenderness. There is no guarding.     Hernia: No hernia is present.  Musculoskeletal:        General: Normal range of motion.     Cervical back: Normal range of motion and neck supple.     Right lower leg: No edema.     Left lower leg: No edema.  Lymphadenopathy:     Cervical: No cervical adenopathy.  Skin:    General: Skin is warm and dry.     Coloration: Skin is not jaundiced.  Neurological:     General: No focal deficit present.     Mental Status: She is alert.     Cranial Nerves: Cranial nerves 2-12  are intact.     Sensory: Sensation is intact.     Motor: Weakness present. No tremor, atrophy or seizure activity.     Coordination: Coordination is intact.     Gait: Gait is intact.     Deep Tendon Reflexes:     Reflex Scores:      Tricep reflexes are 0 on the right side and 0 on the left side.      Bicep reflexes are 0 on the right side and 0 on the left side.      Brachioradialis reflexes are 0 on the right side and 0 on the left side.      Patellar reflexes are 1+ on the right side and 1+ on the left side.      Achilles reflexes are 0 on the right side and 0 on the left side.    Comments: RUE weakness     Lab Results  Component Value Date   WBC 3.9 (L) 09/22/2023   HGB 15.0 09/22/2023   HCT 44.8 09/22/2023   PLT 252.0 09/22/2023   GLUCOSE 132 (H) 09/22/2023   CHOL 263 (H) 09/22/2023   TRIG 248.0 (H) 09/22/2023   HDL 44.30 09/22/2023   LDLDIRECT 142.0 01/28/2016   LDLCALC 169 (H) 09/22/2023   ALT 40 (H) 09/22/2023   AST 24 09/22/2023   NA 141 09/22/2023   K 3.8 09/22/2023   CL 102 09/22/2023   CREATININE 0.74 09/22/2023   BUN 14 09/22/2023   CO2 28 09/22/2023   TSH 3.70 09/22/2023   INR 1.0 12/22/2023   HGBA1C 8.2 (H) 12/22/2023   MICROALBUR 1.7 09/22/2023    DG Bone Density Result Date: 03/04/2023 Table formatting from the original result was not included. Date of study: 03/04/2023 Exam: DUAL X-RAY ABSORPTIOMETRY (DXA) FOR  BONE MINERAL DENSITY (BMD) Instrument: [employer] Requesting Provider: PCP Indication: follow up for low BMD Comparison: none (please note that it is not possible to compare data from different instruments) Clinical data: Pt is a 73 y.o. female with previous history of fracture. Results:  Lumbar spine L1-L4 Femoral neck (FN) T-score -0.4 RFN: -1.7 LFN: -1.7 Assessment: By the [facility] Criteria for diagnosis based on bone density, this patient has Low Bone Density FRAX 10-year fracture risk calculator: 16.5 % for any major fracture and 2.9 % for hip fracture. Pharmacologic therapy is recommended if 10 year fracture risk is >20% for any major osteoporotic fracture or >3% for hip fracture.  Comments: the technical quality of the study is good. WHO criteria for diagnosis of osteoporosis in postmenopausal women and in men 60 y/o or older: - normal: T-score -1.0 to + 1.0 - osteopenia/low bone density: T-score between -2.5 and -1.0 - osteoporosis: T-score below -2.5 - severe osteoporosis: T-score below -2.5 with history of fragility fracture Note: although not part of the WHO classification, the presence of a fragility fracture, regardless of the T-score, should be considered diagnostic of osteoporosis, provided other causes for the fracture have been excluded. RECOMMENDATION: 1. All patients should optimize calcium  and vitamin D  intake. 2. Consider FDA-approved medical therapies in postmenopausal women and men aged 23 years and older, based on the following: a. A hip or vertebral(clinical or morphometric) fracture. b. T-Score of  -2.5 or less at the femoral neck , total hip or spine after appropriate evaluation to exclude secondary causes c. Low bone mass (T-score between -1.0 and -2.5 at the femoral neck or spine) and a 10 year probability of a hip fracture >3%  or a 10 year probability of major osteoporosis-related fracture > 20% based on the [place] -adapted WHO algorithm d. Clinical judgement and/or patient preferences  may indicate treatment for people with 10-year fracture probabilities above or below these levels Follow up BMD is recommended: 2 years Interpreted by : Stefano Redgie Butts, MD [facility]   DG Cervical Spine Complete Result Date: 12/21/2023 EXAM: 6 OR MORE VIEW(S) XRAY OF THE CERVICAL SPINE 12/21/2023 04:26:05 PM COMPARISON: None available. CLINICAL HISTORY: pain into RUE FINDINGS: BONES: Straightening of the cervical spine. Suboptimal visualization C6 and below. The dens and lateral masses are within normal limits. No acute fracture. No aggressive appearing osseous lesion. DISCS AND DEGENERATIVE CHANGES: Mild disc space narrowing at C4-C5 and C5-C6 with mild multilevel degenerative osteophytes. Multilevel facet degenerative change without high-grade bony foraminal narrowing. SOFT TISSUES: No prevertebral soft tissue swelling. The visualized lungs appear clear. IMPRESSION: 1. No acute abnormality of the cervical spine allowing for suboptimal visualization of cervical thoracic junction. 2. Mild disc space narrowing at C4-C5 and C5-C6 with mild multilevel degenerative osteophytes. Electronically signed by: Luke Bun MD 12/21/2023 04:48 PM EST RP Workstation: [id number]    Fibrosis 4 Score = 1.1  Fib-4 interpretation is not validated for people under 35 or over 85 years of age. However, scores under 2.0 are generally considered low risk.   Assessment & Plan:  Diabetes mellitus due to underlying condition with diabetic neuropathy, without long-term current use of insulin  (HCC) -     Hemoglobin A1c; Future -     Ozempic  (0.25 or 0.5 MG/DOSE); Inject 0.25 mg into the skin once a week.  Dispense: 3 mL; Refill: 0  Radiculitis of right cervical region- Will evaluate for mass, nerve impingement, spinal stenosis, disc herniation. -     DG Cervical Spine Complete; Future -     C-reactive protein; Future -     methylPREDNISolone ; TAKE AS DIRECTED  Dispense: 21 tablet; Refill: 0 -      oxyCODONE  HCl; Take 1 tablet (5 mg total) by mouth every 8 (eight) hours as needed for severe pain (pain score 7-10).  Dispense: 30 tablet; Refill: 0 -     MR CERVICAL SPINE WO CONTRAST; Future  Acquired hypothyroidism- She is euthyroid. -     Levothyroxine  Sodium; Take 1 tablet (125 mcg total) by mouth daily.  Dispense: 90 tablet; Refill: 0 -     AMB Referral [facility] Care Management  RLS (restless legs syndrome) -     rOPINIRole  HCl; Take 1 tablet (1 mg total) by mouth 3 (three) times daily.  Dispense: 270 tablet; Refill: 0 -     AMB Referral [facility] Care Management  DDD (degenerative disc disease), cervical -     oxyCODONE  HCl; Take 1 tablet (5 mg total) by mouth every 8 (eight) hours as needed for severe pain (pain score 7-10).  Dispense: 30 tablet; Refill: 0 -     MR CERVICAL SPINE WO CONTRAST; Future  Metabolic dysfunction-associated fatty liver disease (MAFLD) -     Protime-INR; Future     Follow-up: Return in about 3 months (around 03/22/2024).  Debby Molt, MD

## 2023-12-22 ENCOUNTER — Ambulatory Visit
Admission: RE | Admit: 2023-12-22 | Discharge: 2023-12-22 | Disposition: A | Discharge status: Home / Self Care | Source: Ambulatory Visit | Attending: Internal Medicine | Admitting: Internal Medicine

## 2023-12-22 ENCOUNTER — Ambulatory Visit: Payer: Self-pay | Admitting: Internal Medicine

## 2023-12-22 ENCOUNTER — Ambulatory Visit: Payer: Medicare Other

## 2023-12-22 VITALS — BP 110/62 | HR 71 | Ht 59.0 in | Wt 177.4 lb

## 2023-12-22 DIAGNOSIS — M4722 Other spondylosis with radiculopathy, cervical region: Secondary | ICD-10-CM | POA: Diagnosis not present

## 2023-12-22 DIAGNOSIS — Z0000 Encounter for general adult medical examination without abnormal findings: Secondary | ICD-10-CM

## 2023-12-22 DIAGNOSIS — M5011 Cervical disc disorder with radiculopathy,  high cervical region: Secondary | ICD-10-CM | POA: Diagnosis not present

## 2023-12-22 DIAGNOSIS — M5412 Radiculopathy, cervical region: Secondary | ICD-10-CM

## 2023-12-22 DIAGNOSIS — M50123 Cervical disc disorder at C6-C7 level with radiculopathy: Secondary | ICD-10-CM | POA: Diagnosis not present

## 2023-12-22 DIAGNOSIS — M5030 Other cervical disc degeneration, unspecified cervical region: Secondary | ICD-10-CM

## 2023-12-22 DIAGNOSIS — M50122 Cervical disc disorder at C5-C6 level with radiculopathy: Secondary | ICD-10-CM | POA: Diagnosis not present

## 2023-12-22 DIAGNOSIS — M50121 Cervical disc disorder at C4-C5 level with radiculopathy: Secondary | ICD-10-CM | POA: Diagnosis not present

## 2023-12-22 LAB — PROTIME-INR
INR: 1 ratio (ref 0.8–1.0)
Prothrombin Time: 10.9 s (ref 9.6–13.1)

## 2023-12-22 LAB — C-REACTIVE PROTEIN: CRP: <0.5 mg/dL (ref 0.5–20.0)

## 2023-12-22 LAB — HEMOGLOBIN A1C: Hgb A1c MFr Bld: 8.2 % — ABNORMAL HIGH (ref 4.6–6.5)

## 2023-12-22 MED ORDER — OZEMPIC (0.25 OR 0.5 MG/DOSE) 2 MG/3ML ~~LOC~~ SOPN: 0.2500 mg | PEN_INJECTOR | SUBCUTANEOUS | 0 refills | Status: DC

## 2023-12-22 MED ORDER — INSULIN PEN NEEDLE 32G X 6 MM MISC: 1.0000 | 0 refills | Status: AC

## 2023-12-22 NOTE — Progress Notes (Signed)
 Chief Complaint  Patient presents with   Medicare Wellness     Subjective:   Aurora  Halk is a 73 y.o. female who presents for a Medicare Annual Wellness Visit.  Allergies (verified) Hydralazine , Ciprofloxacin , Propofol , and Ritalin [methylphenidate hcl]   History: Past Medical History:  Diagnosis Date   Acute pyelonephritis 05/02/2012   Asthma    Diverticulitis 03/24/2017   see report [facility]   Dyspnea    nl PFTs, and echo   GERD (gastroesophageal reflux disease)    Headache(784.0)    Heart murmur    History of echocardiogram    Echocardiogram 7/23: EF 65-70, no RWMA, Gr 1 DD, normal RVSF, inadequate TR signal to est PASP, trivial MR, AV sclerosis w/o AS   History of kidney stones    IBS (irritable bowel syndrome)    Nephrolithiasis    Pre-diabetes 12/2013   Recurrent oral ulcers    Thyroid  goiter    I 131 rx and then  total throidectomy 2005 [facility]   Past Surgical History:  Procedure Laterality Date   ABDOMINAL HYSTERECTOMY  01/27/1992   fibroid   APPENDECTOMY  01/26/1966   CYSTOSCOPY WITH STENT PLACEMENT Bilateral 03/24/2017   Procedure: BILATERAL URETERAL CATHETER  PLACEMENT;  Surgeon: Alvaro Hummer, MD;  Location: [facility];  Service: Urology;  Laterality: Bilateral;   CYSTOSCOPY/RETROGRADE/URETEROSCOPY  03/24/2017   Procedure: CYSTOSCOPY/RETROGRADE/URETEROSCOPY;  Surgeon: Alvaro Hummer, MD;  Location: [facility];  Service: Urology;;   LAPAROSCOPIC PARTIAL COLECTOMY Left 03/24/2017   Procedure: LAPAROSCOPIC ASSISTED LEFT  SIGMOID COLECTOMY;  Surgeon: Ethyl Lenis, MD;  Location: [facility];  Service: General;  Laterality: Left;   orif left tivial plateau fracture     Dr. Carlos 2/08   plates and pins take out of left knee  06/26/2008   RIGHT/LEFT HEART CATH AND CORONARY ANGIOGRAPHY N/A 07/24/2021   Procedure: RIGHT/LEFT HEART CATH AND CORONARY ANGIOGRAPHY;  Surgeon: Burnard Debby LABOR, MD;  Location: [facility];  Service:  Cardiovascular;  Laterality: N/A;   SIGMOIDOSCOPY  03/24/2017   Procedure: SIGMOIDOSCOPY;  Surgeon: Ethyl Lenis, MD;  Location: [facility];  Service: General;;   TONSILLECTOMY  01/26/1977   TOTAL KNEE REVISION Left 2010   TOTAL KNEE REVISION Right 2022   TOTAL THYROIDECTOMY  01/27/2003   for  large nodule 2006   Family History  Problem Relation Age of Onset   COPD Sister    Coronary artery disease Sister        age 39 also copd   Cancer Mother        bladder   Arthritis Other    Cancer Other        breast   Diabetes Other    Hyperlipidemia Other    Hypertension Other    Stroke Other    Heart disease Other    Coronary artery disease Father        also had AAA   Aneurysm Father        Aortic and thoracic fatal   Social History   Occupational History   Occupation: private duty/part time  Tobacco Use   Smoking status: Never   Smokeless tobacco: Never  Vaping Use   Vaping status: Never Used  Substance and Sexual Activity   Alcohol use: Yes    Comment: seldom   Drug use: No   Sexual activity: Yes   Tobacco Counseling Counseling given: Not Answered  SDOH Screenings   Food Insecurity: No Food Insecurity (12/22/2023)  Housing: Unknown (12/22/2023)  Transportation Needs: No Transportation Needs (12/22/2023)  Utilities: Not At Risk (12/22/2023)  Alcohol Screen: Low Risk  (12/21/2022)  Depression (PHQ2-9): Low Risk  (12/22/2023)  Financial Resource Strain: Patient Declined (12/06/2023)   Received from [facility]  Physical Activity: Inactive (12/22/2023)  Social Connections: Moderately Integrated (12/22/2023)  Stress: No Stress Concern Present (12/22/2023)  Tobacco Use: Low Risk  (12/22/2023)  Health Literacy: Adequate Health Literacy (12/22/2023)   See flowsheets for full screening details  Depression Screen PHQ 2 & 9 Depression Scale- Over the past 2 weeks, how often have you been bothered by any of the following problems? Little interest or pleasure in doing  things: 0 Feeling down, depressed, or hopeless (PHQ Adolescent also includes...irritable): 0 PHQ-2 Total Score: 0 Trouble falling or staying asleep, or sleeping too much: 3 (due to restless legs) Feeling tired or having little energy: 0 Poor appetite or overeating (PHQ Adolescent also includes...weight loss): 0 Feeling bad about yourself - or that you are a failure or have let yourself or your family down: 0 Trouble concentrating on things, such as reading the newspaper or watching television (PHQ Adolescent also includes...like school work): 0 Moving or speaking so slowly that other people could have noticed. Or the opposite - being so fidgety or restless that you have been moving around a lot more than usual: 0 Thoughts that you would be better off dead, or of hurting yourself in some way: 0 PHQ-9 Total Score: 3 If you checked off any problems, how difficult have these problems made it for you to do your work, take care of things at home, or get along with other people?: Not difficult at all  Depression Treatment Depression Interventions/Treatment : EYV7-0 Score <4 Follow-up Not Indicated     Goals Addressed               This Visit's Progress     Patient Stated (pt-stated)        Patient stated she plans to manage her sugar intake and levels and restless legs discomfort       Visit info / Clinical Intake: Medicare Wellness Visit Type:: Subsequent Annual Wellness Visit Persons participating in visit:: patient Medicare Wellness Visit Mode:: In-person (required for WTM) Information given by:: patient Interpreter Needed?: No Pre-visit prep was completed: yes AWV questionnaire completed by patient prior to visit?: no Living arrangements:: lives with spouse/significant other Patient's Overall Health Status Rating: good Typical amount of pain: none Does pain affect daily life?: no Are you currently prescribed opioids?: (!) yes  Dietary Habits and Nutritional Risks How many  meals a day?: 3 Eats fruit and vegetables daily?: yes Most meals are obtained by: preparing own meals In the last 2 weeks, have you had any of the following?: none Diabetic:: (!) yes How often do you check your BS?: 1; as needed Would you like to be referred to a Nutritionist or for Diabetic Management? : no  Functional Status Activities of Daily Living (to include ambulation/medication): Independent Ambulation: Independent with device- listed below Home Assistive Devices/Equipment: Eyeglasses; CPAP Medication Administration: Independent Home Management: Independent Manage your own finances?: yes Primary transportation is: driving Concerns about vision?: no *vision screening is required for WTM* Concerns about hearing?: no  Fall Screening Falls in the past year?: 0 Number of falls in past year: 0 Was there an injury with Fall?: 0 Fall Risk Category Calculator: 0 Patient Fall Risk Level: Low Fall Risk  Fall Risk Patient at Risk for Falls Due to: No Fall Risks Fall  risk Follow up: Falls evaluation completed  Home and Transportation Safety: All rugs have non-skid backing?: N/A, no rugs All stairs or steps have railings?: N/A, no stairs Grab bars in the bathtub or shower?: (!) no Have non-skid surface in bathtub or shower?: yes Good home lighting?: yes Hospital stays in the last year:: no  Cognitive Assessment Difficulty concentrating, remembering, or making decisions? : no Will 6CIT or Mini Cog be Completed: yes What year is it?: 0 points What month is it?: 0 points Give patient an address phrase to remember (5 components): [address] [place], [place] About what time is it?: 0 points Count backwards from 20 to 1: 0 points Say the months of the year in reverse: 0 points Repeat the address phrase from earlier: 0 points 6 CIT Score: 0 points  Advance Directives (For Healthcare) Does Patient Have a Medical Advance Directive?: No Would patient like information on creating  a medical advance directive?: No - Patient declined  Reviewed/Updated  Reviewed/Updated: Reviewed All (Medical, Surgical, Family, Medications, Allergies, Care Teams, Patient Goals)        Objective:    Today's Vitals   12/22/23 0807  BP: 110/62  Pulse: 71  SpO2: 98%  Weight: 177 lb 6.4 oz (80.5 kg)  Height: 4' 11 (1.499 m)   Body mass index is 35.83 kg/m.  Current Medications (verified) Outpatient Encounter Medications as of 12/22/2023  Medication Sig   acetaminophen  (TYLENOL ) 500 MG tablet Take 1,000 mg by mouth 3 (three) times daily.   aspirin  EC 81 MG tablet Take 1 tablet (81 mg total) by mouth daily. Swallow whole.   Blood Glucose Monitoring Suppl (FREESTYLE LITE) w/Device KIT USE TWO TIMES A DAY TO TEST GLUCOSE CONTROL   Cholecalciferol (VITAMIN D3) 50 MCG (2000 UT) capsule TAKE 1 CAPSULE BY MOUTH DAILY   dapagliflozin  propanediol (FARXIGA ) 10 MG TABS tablet Take 1 tablet (10 mg total) by mouth daily before breakfast.   ezetimibe  (ZETIA ) 10 MG tablet Take 1 tablet (10 mg total) by mouth daily.   FREESTYLE LITE test strip USE 1 STRIP TO TEST TWICE A DAY   Insulin  Pen Needle 32G X 6 MM MISC 1 Act by Does not apply route once a week.   levothyroxine  (SYNTHROID ) 125 MCG tablet Take 1 tablet (125 mcg total) by mouth daily.   metFORMIN  (GLUCOPHAGE -XR) 750 MG 24 hr tablet Take 1 tablet (750 mg total) by mouth daily with breakfast.   methylPREDNISolone  (MEDROL  DOSEPAK) 4 MG TBPK tablet TAKE AS DIRECTED   nitroGLYCERIN  (NITROSTAT ) 0.4 MG SL tablet Place 1 tablet (0.4 mg total) under the tongue every 5 (five) minutes as needed for chest pain.   olmesartan  (BENICAR ) 20 MG tablet TAKE 1 TABLET BY MOUTH DAILY   oxyCODONE  (OXY IR/ROXICODONE ) 5 MG immediate release tablet Take 1 tablet (5 mg total) by mouth every 8 (eight) hours as needed for severe pain (pain score 7-10).   pantoprazole  (PROTONIX ) 40 MG tablet TAKE 1 TABLET BY MOUTH DAILY   pregabalin  (LYRICA ) 25 MG capsule Take 1  capsule (25 mg total) by mouth 3 (three) times daily.   rOPINIRole  (REQUIP ) 1 MG tablet Take 1 tablet (1 mg total) by mouth 3 (three) times daily.   rosuvastatin  (CRESTOR ) 10 MG tablet Take 1 tablet (10 mg total) by mouth daily.   fluocinonide -emollient (LIDEX -E) 0.05 % cream Apply 1 Application topically 2 (two) times daily. (Patient not taking: Reported on 12/22/2023)   GEMTESA 75 MG TABS Take 75 mg by mouth daily. (  Patient not taking: Reported on 12/22/2023)   levocetirizine (XYZAL ) 5 MG tablet Take 1 tablet (5 mg total) by mouth every evening. (Patient not taking: Reported on 12/22/2023)   No facility-administered encounter medications on file as of 12/22/2023.   Hearing/Vision screen Hearing Screening - Comments:: Denies hearing difficulties   Vision Screening - Comments:: Wears rx glasses - up to date with routine eye exams with Lamarr Burkitt Immunizations and Health Maintenance Health Maintenance  Topic Date Due   DTaP/Tdap/Td (3 - Td or Tdap) 01/25/2023   Zoster Vaccines- Shingrix  (1 of 2) 03/22/2024 (Originally 07/19/1969)   Influenza Vaccine  04/25/2024 (Originally 08/27/2023)   HEMOGLOBIN A1C  03/24/2024   Mammogram  04/13/2024   OPHTHALMOLOGY EXAM  05/04/2024   Diabetic kidney evaluation - eGFR measurement  09/21/2024   Diabetic kidney evaluation - Urine ACR  09/21/2024   FOOT EXAM  09/21/2024   Medicare Annual Wellness (AWV)  12/21/2024   Colonoscopy  05/11/2033   Pneumococcal Vaccine: 50+ Years  Completed   Bone Density Scan  Completed   Hepatitis C Screening  Completed   Meningococcal B Vaccine  Aged Out   COVID-19 Vaccine  Discontinued        Assessment/Plan:  This is a routine wellness examination for Naryah .  Patient Care Team: Joshua Debby CROME, MD as PCP - General (Internal Medicine) Delford Maude BROCKS, MD as PCP - Cardiology (Cardiology) Arlis Curd, MD as Referring Physician (Orthopedic Surgery) Aleene Kitchens, MD (Inactive) as Attending Physician  (Urology) Delford Maude BROCKS, MD as Consulting Physician (Cardiology) Luis Purchase, MD as Consulting Physician (Gastroenterology) Case, Jordan, MD as Referring Physician (Orthopedic Surgery) Burkitt Lamarr, MD as Consulting Physician (Ophthalmology)  I have personally reviewed and noted the following in the patient's chart:   Medical and social history Use of alcohol, tobacco or illicit drugs  Current medications and supplements including opioid prescriptions. Functional ability and status Nutritional status Physical activity Advanced directives List of other physicians Hospitalizations, surgeries, and ER visits in previous 12 months Vitals Screenings to include cognitive, depression, and falls Referrals and appointments  No orders of the defined types were placed in this encounter.  In addition, I have reviewed and discussed with patient certain preventive protocols, quality metrics, and best practice recommendations. A written personalized care plan for preventive services as well as general preventive health recommendations were provided to patient.   Verdie CHRISTELLA Saba, CMA   12/22/2023   Return in 1 year (on 12/21/2024).  After Visit Summary: (In Person-Declined) Patient declined AVS at this time.  Nurse Notes: Scheduled a 3-mth Diabetes f/u appt; Scheduled 2026 AWV/CPE appts

## 2023-12-22 NOTE — Patient Instructions (Addendum)
 Ms. Hedgepeth,  Thank you for taking the time for your Medicare Wellness Visit. I appreciate your continued commitment to your health goals. Please review the care plan we discussed, and feel free to reach out if I can assist you further.  Please note that Annual Wellness Visits do not include a physical exam. Some assessments may be limited, especially if the visit was conducted virtually. If needed, we may recommend an in-person follow-up with your provider.  Ongoing Care Seeing your primary care provider every 3 to 6 months helps us  monitor your health and provide consistent, personalized care.   Referrals If a referral was made during today's visit and you haven't received any updates within two weeks, please contact the referred provider directly to check on the status.  Recommended Screenings:  Health Maintenance  Topic Date Due   DTaP/Tdap/Td vaccine (3 - Td or Tdap) 01/25/2023   Zoster (Shingles) Vaccine (1 of 2) 03/22/2024*   Flu Shot  04/25/2024*   Hemoglobin A1C  03/24/2024   Breast Cancer Screening  04/13/2024   Eye exam for diabetics  05/04/2024   Yearly kidney function blood test for diabetes  09/21/2024   Yearly kidney health urinalysis for diabetes  09/21/2024   Complete foot exam   09/21/2024   Medicare Annual Wellness Visit  12/21/2024   Colon Cancer Screening  05/11/2033   Pneumococcal Vaccine for age over 62  Completed   Osteoporosis screening with Bone Density Scan  Completed   Hepatitis C Screening  Completed   Meningitis B Vaccine  Aged Out   COVID-19 Vaccine  Discontinued  *Topic was postponed. The date shown is not the original due date.       12/22/2023    8:09 AM  Advanced Directives  Does Patient Have a Medical Advance Directive? No  Would patient like information on creating a medical advance directive? No - Patient declined    Vision: Annual vision screenings are recommended for early detection of glaucoma, cataracts, and diabetic retinopathy.  These exams can also reveal signs of chronic conditions such as diabetes and high blood pressure.  Dental: Annual dental screenings help detect early signs of oral cancer, gum disease, and other conditions linked to overall health, including heart disease and diabetes.  Managing Pain Without Opioids Opioids are strong medicines used to treat moderate to severe pain. For some people, especially those who have long-term (chronic) pain, opioids may not be the best choice for pain management due to: Side effects like nausea, constipation, and sleepiness. The risk of addiction (opioid use disorder). The longer you take opioids, the greater your risk of addiction. Pain that lasts for more than 3 months is called chronic pain. Managing chronic pain usually requires more than one approach and is often provided by a team of health care providers working together (multidisciplinary approach). Pain management may be done at a pain management center or pain clinic. How to manage pain without the use of opioids Use non-opioid medicines Non-opioid medicines for pain may include: Over-the-counter or prescription non-steroidal anti-inflammatory drugs (NSAIDs). These may be the first medicines used for pain. They work well for muscle and bone pain, and they reduce swelling. Acetaminophen . This over-the-counter medicine may work well for milder pain but not swelling. Antidepressants. These may be used to treat chronic pain. A certain type of antidepressant (tricyclics) is often used. These medicines are given in lower doses for pain than when used for depression. Anticonvulsants. These are usually used to treat seizures but may also  reduce nerve (neuropathic) pain. Muscle relaxants. These relieve pain caused by sudden muscle tightening (spasms). You may also use a pain medicine that is applied to the skin as a patch, cream, or gel (topical analgesic), such as a numbing medicine. These may cause fewer side effects  than medicines taken by mouth. Do certain therapies as directed Some therapies can help with pain management. They include: Physical therapy. You will do exercises to gain strength and flexibility. A physical therapist may teach you exercises to move and stretch parts of your body that are weak, stiff, or painful. You can learn these exercises at physical therapy visits and practice them at home. Physical therapy may also involve: Massage. Heat wraps or applying heat or cold to affected areas. Electrical signals that interrupt pain signals (transcutaneous electrical nerve stimulation, TENS). Weak lasers that reduce pain and swelling (low-level laser therapy). Signals from your body that help you learn to regulate pain (biofeedback). Occupational therapy. This helps you to learn ways to function at home and work with less pain. Recreational therapy. This involves trying new activities or hobbies, such as a physical activity or drawing. Mental health therapy, including: Cognitive behavioral therapy (CBT). This helps you learn coping skills for dealing with pain. Acceptance and commitment therapy (ACT) to change the way you think and react to pain. Relaxation therapies, including muscle relaxation exercises and mindfulness-based stress reduction. Pain management counseling. This may be individual, family, or group counseling.  Receive medical treatments Medical treatments for pain management include: Nerve block injections. These may include a pain blocker and anti-inflammatory medicines. You may have injections: Near the spine to relieve chronic back or neck pain. Into joints to relieve back or joint pain. Into nerve areas that supply a painful area to relieve body pain. Into muscles (trigger point injections) to relieve some painful muscle conditions. A medical device placed near your spine to help block pain signals and relieve nerve pain or chronic back pain (spinal cord stimulation  device). Acupuncture. Follow these instructions at home Medicines Take over-the-counter and prescription medicines only as told by your health care provider. If you are taking pain medicine, ask your health care providers about possible side effects to watch out for. Do not drive or use heavy machinery while taking prescription opioid pain medicine. Lifestyle  Do not use drugs or alcohol to reduce pain. If you drink alcohol, limit how much you have to: 0-1 drink a day for women who are not pregnant. 0-2 drinks a day for men. Know how much alcohol is in a drink. In the [place] one drink equals one 12 oz bottle of beer (355 mL), one 5 oz glass of wine (148 mL), or one 1 oz glass of hard liquor (44 mL). Do not use any products that contain nicotine or tobacco. These products include cigarettes, chewing tobacco, and vaping devices, such as e-cigarettes. If you need help quitting, ask your health care provider. Eat a healthy diet and maintain a healthy weight. Poor diet and excess weight may make pain worse. Eat foods that are high in fiber. These include fresh fruits and vegetables, whole grains, and beans. Limit foods that are high in fat and processed sugars, such as fried and sweet foods. Exercise regularly. Exercise lowers stress and may help relieve pain. Ask your health care provider what activities and exercises are safe for you. If your health care provider approves, join an exercise class that combines movement and stress reduction. Examples include yoga and tai chi. Get enough  sleep. Lack of sleep may make pain worse. Lower stress as much as possible. Practice stress reduction techniques as told by your therapist. General instructions Work with all your pain management providers to find the treatments that work best for you. You are an important member of your pain management team. There are many things you can do to reduce pain on your own. Consider joining an online or in-person  support group for people who have chronic pain. Keep all follow-up visits. This is important. Where to find more information You can find more information about managing pain without opioids from: American Academy of Pain Medicine: painmed.org Institute for Chronic Pain: instituteforchronicpain.org American Chronic Pain Association: theacpa.org Contact a health care provider if: You have side effects from pain medicine. Your pain gets worse or does not get better with treatments or home therapy. You are struggling with anxiety or depression. Summary Many types of pain can be managed without opioids. Chronic pain may respond better to pain management without opioids. Pain is best managed when you and a team of health care providers work together. Pain management without opioids may include non-opioid medicines, medical treatments, physical therapy, mental health therapy, and lifestyle changes. Tell your health care providers if your pain gets worse or is not being managed well enough. This information is not intended to replace advice given to you by your health care provider. Make sure you discuss any questions you have with your health care provider. Document Revised: 04/24/2020 Document Reviewed: 04/24/2020 Elsevier Patient Education  2024 [employer].

## 2023-12-27 ENCOUNTER — Telehealth: Payer: Self-pay | Admitting: *Deleted

## 2023-12-27 ENCOUNTER — Other Ambulatory Visit: Payer: Self-pay | Admitting: Internal Medicine

## 2023-12-27 DIAGNOSIS — M5412 Radiculopathy, cervical region: Secondary | ICD-10-CM

## 2023-12-27 DIAGNOSIS — M4722 Other spondylosis with radiculopathy, cervical region: Secondary | ICD-10-CM | POA: Diagnosis not present

## 2023-12-27 DIAGNOSIS — M50122 Cervical disc disorder at C5-C6 level with radiculopathy: Secondary | ICD-10-CM | POA: Diagnosis not present

## 2023-12-27 DIAGNOSIS — M5011 Cervical disc disorder with radiculopathy,  high cervical region: Secondary | ICD-10-CM | POA: Diagnosis not present

## 2023-12-27 DIAGNOSIS — M50123 Cervical disc disorder at C6-C7 level with radiculopathy: Secondary | ICD-10-CM | POA: Diagnosis not present

## 2023-12-27 DIAGNOSIS — M5030 Other cervical disc degeneration, unspecified cervical region: Secondary | ICD-10-CM

## 2023-12-27 DIAGNOSIS — M50121 Cervical disc disorder at C4-C5 level with radiculopathy: Secondary | ICD-10-CM | POA: Diagnosis not present

## 2023-12-27 NOTE — Progress Notes (Signed)
 Care Guide Pharmacy Note  12/27/2023 Name: [name] MRN: [id number] DOB: [birth year]  Referred By: Joshua Debby CROME, MD Reason for referral: Call Attempt #1 and Complex Care Management (Outreach to schedule referral with pharmacist )   Helen  Lin is a 73 y.o. year old female who is a primary care patient of Joshua Debby CROME, MD.  Helen  Lin was referred to the pharmacist for assistance related to: DMII  Successful contact was made with the patient to discuss pharmacy services including being ready for the pharmacist to call at least 5 minutes before the scheduled appointment time and to have medication bottles and any blood pressure readings ready for review. The patient agreed to meet with the pharmacist via telephone visit on 12/31/2023  Thedford Franks, CMA [place]  [facility], [facility] Guide Direct Dial: [phone]  Fax: [phone] Website: [email or web address]

## 2023-12-28 DIAGNOSIS — K638211 Small intestinal bacterial overgrowth, hydrogen-subtype: Secondary | ICD-10-CM | POA: Diagnosis not present

## 2023-12-29 ENCOUNTER — Other Ambulatory Visit: Payer: Self-pay | Admitting: Internal Medicine

## 2023-12-29 ENCOUNTER — Encounter: Payer: Self-pay | Admitting: Emergency Medicine

## 2023-12-29 ENCOUNTER — Ambulatory Visit: Admitting: Emergency Medicine

## 2023-12-29 ENCOUNTER — Ambulatory Visit: Admitting: Physical Medicine and Rehabilitation

## 2023-12-29 ENCOUNTER — Encounter: Payer: Self-pay | Admitting: Physical Medicine and Rehabilitation

## 2023-12-29 VITALS — BP 144/100 | HR 80 | Temp 97.9°F | Ht 59.0 in | Wt 177.0 lb

## 2023-12-29 DIAGNOSIS — M47819 Spondylosis without myelopathy or radiculopathy, site unspecified: Secondary | ICD-10-CM | POA: Diagnosis not present

## 2023-12-29 DIAGNOSIS — R0981 Nasal congestion: Secondary | ICD-10-CM | POA: Diagnosis not present

## 2023-12-29 DIAGNOSIS — R6889 Other general symptoms and signs: Secondary | ICD-10-CM | POA: Diagnosis not present

## 2023-12-29 DIAGNOSIS — M545 Low back pain, unspecified: Secondary | ICD-10-CM

## 2023-12-29 DIAGNOSIS — R202 Paresthesia of skin: Secondary | ICD-10-CM | POA: Diagnosis not present

## 2023-12-29 DIAGNOSIS — M79604 Pain in right leg: Secondary | ICD-10-CM | POA: Diagnosis not present

## 2023-12-29 DIAGNOSIS — M79605 Pain in left leg: Secondary | ICD-10-CM

## 2023-12-29 DIAGNOSIS — B9689 Other specified bacterial agents as the cause of diseases classified elsewhere: Secondary | ICD-10-CM | POA: Insufficient documentation

## 2023-12-29 DIAGNOSIS — G8929 Other chronic pain: Secondary | ICD-10-CM

## 2023-12-29 DIAGNOSIS — J329 Chronic sinusitis, unspecified: Secondary | ICD-10-CM | POA: Diagnosis not present

## 2023-12-29 DIAGNOSIS — E119 Type 2 diabetes mellitus without complications: Secondary | ICD-10-CM | POA: Insufficient documentation

## 2023-12-29 DIAGNOSIS — M47816 Spondylosis without myelopathy or radiculopathy, lumbar region: Secondary | ICD-10-CM | POA: Diagnosis not present

## 2023-12-29 LAB — POCT INFLUENZA A/B
Influenza A, POC: NEGATIVE
Influenza B, POC: NEGATIVE

## 2023-12-29 LAB — POC COVID19 BINAXNOW: SARS Coronavirus 2 Ag: NEGATIVE

## 2023-12-29 MED ORDER — FREESTYLE LIBRE 3 PLUS SENSOR MISC: 1.0000 | 1 refills | Status: AC

## 2023-12-29 MED ORDER — BASAGLAR KWIKPEN 100 UNIT/ML ~~LOC~~ SOPN: 10.0000 [IU] | PEN_INJECTOR | Freq: Every day | SUBCUTANEOUS | 1 refills | Status: AC

## 2023-12-29 MED ORDER — AMOXICILLIN-POT CLAVULANATE 875-125 MG PO TABS: 1.0000 | ORAL_TABLET | Freq: Two times a day (BID) | ORAL | 0 refills | Status: AC

## 2023-12-29 NOTE — Progress Notes (Signed)
 Core Outcome Measures Index (COMI) Back Score  Average Pain 8  COMI Score 80 %

## 2023-12-29 NOTE — Progress Notes (Signed)
 Pain Scale   Average Pain 3 Patient advising she has spine pain that radiates to both legs. Patient also here for MRI review.        +Driver, -BT, -Dye Allergies.

## 2023-12-29 NOTE — Progress Notes (Signed)
 Helen  Lin 73 y.o.   Chief Complaint  Patient presents with   Sinus Problem    Losing voice and runny nose a lot of drainage and los of voice, with a very dry cough     HISTORY OF PRESENT ILLNESS: Acute problem visit today. This is a 73 y.o. female complaining of flu like symptoms that started about 1 week ago and progressively getting worse Mostly complaining of cough, nasal congestion and discharge, and sinus congestion.  Sinus Problem Associated symptoms include congestion and coughing. Pertinent negatives include no chills, ear pain, headaches or sore throat.     Prior to Admission medications   Medication Sig Start Date End Date Taking? Authorizing Provider  acetaminophen  (TYLENOL ) 500 MG tablet Take 1,000 mg by mouth 3 (three) times daily.   Yes [provider]  aspirin  EC 81 MG tablet Take 1 tablet (81 mg total) by mouth daily. Swallow whole. 04/01/23  Yes Joshua Debby CROME, MD  Blood Glucose Monitoring Suppl (FREESTYLE LITE) w/Device KIT USE TWO TIMES A DAY TO TEST GLUCOSE CONTROL 10/04/23  Yes Joshua Debby CROME, MD  Cholecalciferol (VITAMIN D3) 50 MCG (2000 UT) capsule TAKE 1 CAPSULE BY MOUTH DAILY 07/02/23  Yes Joshua Debby CROME, MD  dapagliflozin  propanediol (FARXIGA ) 10 MG TABS tablet Take 1 tablet (10 mg total) by mouth daily before breakfast. 12/03/23  Yes Joshua Debby CROME, MD  ezetimibe  (ZETIA ) 10 MG tablet Take 1 tablet (10 mg total) by mouth daily. 09/22/23  Yes Joshua Debby CROME, MD  FREESTYLE LITE test strip USE 1 STRIP TO TEST TWICE A DAY 07/24/21  Yes Panosh, Wanda K, MD  Insulin  Pen Needle 32G X 6 MM MISC 1 Act by Does not apply route once a week. 12/22/23  Yes Joshua Debby CROME, MD  levothyroxine  (SYNTHROID ) 125 MCG tablet Take 1 tablet (125 mcg total) by mouth daily. 12/21/23  Yes Joshua Debby CROME, MD  metFORMIN  (GLUCOPHAGE -XR) 750 MG 24 hr tablet Take 1 tablet (750 mg total) by mouth daily with breakfast. 09/22/23  Yes Joshua Debby CROME, MD  nitroGLYCERIN  (NITROSTAT )  0.4 MG SL tablet Place 1 tablet (0.4 mg total) under the tongue every 5 (five) minutes as needed for chest pain. 07/22/21  Yes Weaver, Scott T, PA-C  olmesartan  (BENICAR ) 20 MG tablet TAKE 1 TABLET BY MOUTH DAILY 12/15/23  Yes Joshua Debby CROME, MD  oxyCODONE  (OXY IR/ROXICODONE ) 5 MG immediate release tablet Take 1 tablet (5 mg total) by mouth every 8 (eight) hours as needed for severe pain (pain score 7-10). 12/21/23  Yes Joshua Debby CROME, MD  pantoprazole  (PROTONIX ) 40 MG tablet TAKE 1 TABLET BY MOUTH DAILY 12/16/23  Yes Joshua Debby CROME, MD  pregabalin  (LYRICA ) 25 MG capsule Take 1 capsule (25 mg total) by mouth 3 (three) times daily. 10/08/23  Yes Joshua Debby CROME, MD  rOPINIRole  (REQUIP ) 1 MG tablet Take 1 tablet (1 mg total) by mouth 3 (three) times daily. 12/21/23  Yes Joshua Debby CROME, MD  rosuvastatin  (CRESTOR ) 10 MG tablet Take 1 tablet (10 mg total) by mouth daily. 09/22/23  Yes Joshua Debby CROME, MD  Semaglutide ,0.25 or 0.5MG /DOS, (OZEMPIC , 0.25 OR 0.5 MG/DOSE,) 2 MG/3ML SOPN Inject 0.25 mg into the skin once a week. 12/22/23  Yes Joshua Debby CROME, MD    Allergies  Allergen Reactions   Hydralazine  Anaphylaxis, Anxiety, Dermatitis, Rash and Other (See Comments)    Flushing, Anxiety, Tremors    Ciprofloxacin  Nausea Only   Propofol  Other (See Comments)  Agitated, combative   Ritalin [Methylphenidate Hcl] Other (See Comments)    climbed the walls    Patient Active Problem List   Diagnosis Date Noted   Radiculitis of right cervical region 12/21/2023   DDD (degenerative disc disease), cervical 12/21/2023   Metabolic dysfunction-associated fatty liver disease (MAFLD) 12/21/2023   Diabetes mellitus due to underlying condition with diabetic neuropathy, without long-term current use of insulin  (HCC) 10/08/2023   Need for prophylactic vaccination with combined diphtheria-tetanus-pertussis (DTP) vaccine 09/22/2023   Need for prophylactic vaccination and inoculation against varicella 09/22/2023    Dyslipidemia, goal LDL below 70 01/06/2022   Stage 3a chronic kidney disease (HCC) 01/06/2022   Obstructive sleep apnea 09/08/2021   Primary hypertension 06/30/2021   Osteopenia 04/25/2010   CATARACT, LEFT EYE 11/27/2009   Obesity (BMI 30-39.9) 01/12/2008   VITAMIN D  DEFICIENCY 11/25/2006   Hypothyroidism 10/04/2006   RLS (restless legs syndrome) 10/04/2006   GERD 09/30/2006    Past Medical History:  Diagnosis Date   Acute pyelonephritis 05/02/2012   Asthma    Diverticulitis 03/24/2017   see report [facility] [place] [facility]   Dyspnea    nl PFTs, and echo   GERD (gastroesophageal reflux disease)    Headache(784.0)    Heart murmur    History of echocardiogram    Echocardiogram 7/23: EF 65-70, no RWMA, Gr 1 DD, normal RVSF, inadequate TR signal to est PASP, trivial MR, AV sclerosis w/o AS   History of kidney stones    IBS (irritable bowel syndrome)    Nephrolithiasis    Pre-diabetes 12/2013   Recurrent oral ulcers    Thyroid  goiter    I 131 rx and then  total throidectomy 2005 [facility]    Past Surgical History:  Procedure Laterality Date   ABDOMINAL HYSTERECTOMY  01/27/1992   fibroid   APPENDECTOMY  01/26/1966   CYSTOSCOPY WITH STENT PLACEMENT Bilateral 03/24/2017   Procedure: BILATERAL URETERAL CATHETER  PLACEMENT;  Surgeon: Alvaro Hummer, MD;  Location: [facility];  Service: Urology;  Laterality: Bilateral;   CYSTOSCOPY/RETROGRADE/URETEROSCOPY  03/24/2017   Procedure: CYSTOSCOPY/RETROGRADE/URETEROSCOPY;  Surgeon: Alvaro Hummer, MD;  Location: [facility];  Service: Urology;;   LAPAROSCOPIC PARTIAL COLECTOMY Left 03/24/2017   Procedure: LAPAROSCOPIC ASSISTED LEFT  SIGMOID COLECTOMY;  Surgeon: Ethyl Lenis, MD;  Location: [facility];  Service: General;  Laterality: Left;   orif left tivial plateau fracture     Dr. Carlos 2/08   plates and pins take out of left knee  06/26/2008   RIGHT/LEFT HEART CATH AND CORONARY ANGIOGRAPHY N/A 07/24/2021   Procedure: RIGHT/LEFT HEART  CATH AND CORONARY ANGIOGRAPHY;  Surgeon: Burnard Debby LABOR, MD;  Location: [facility];  Service: Cardiovascular;  Laterality: N/A;   SIGMOIDOSCOPY  03/24/2017   Procedure: SIGMOIDOSCOPY;  Surgeon: Ethyl Lenis, MD;  Location: [facility];  Service: General;;   TONSILLECTOMY  01/26/1977   TOTAL KNEE REVISION Left 2010   TOTAL KNEE REVISION Right 2022   TOTAL THYROIDECTOMY  01/27/2003   for  large nodule 2006    Social History   Socioeconomic History   Marital status: Married    Spouse name: Sharolyn   Number of children: 4   Years of education: Not on file   Highest education level: Not on file  Occupational History   Occupation: private duty/part time  Tobacco Use   Smoking status: Never   Smokeless tobacco: Never  Vaping Use   Vaping status: Never Used  Substance and Sexual Activity   Alcohol use: Yes  Comment: seldom   Drug use: No   Sexual activity: Yes  Other Topics Concern   Not on file  Social History Narrative   Married   Has grandchildren   Never smoked G4P4hh of 2  care of GC ages 6 - 6 month  5 days a week   Social Drivers of Corporate Investment Banker Strain: Patient Declined (12/06/2023)   Received from [employer]   Overall Financial Resource Strain (CARDIA)    How hard is it for you to pay for the very basics like food, housing, medical care, and heating?: Patient declined  Food Insecurity: No Food Insecurity (12/22/2023)   Hunger Vital Sign    Worried About Running Out of Food in the Last Year: Never true    Ran Out of Food in the Last Year: Never true  Transportation Needs: No Transportation Needs (12/22/2023)   PRAPARE - Administrator, Civil Service (Medical): No    Lack of Transportation (Non-Medical): No  Physical Activity: Inactive (12/22/2023)   Exercise Vital Sign    Days of Exercise per Week: 0 days    Minutes of Exercise per Session: 0 min  Stress: No Stress Concern Present (12/22/2023)   [employer] of Occupational  Health - Occupational Stress Questionnaire    Feeling of Stress: Not at all  Social Connections: Moderately Integrated (12/22/2023)   Social Connection and Isolation Panel    Frequency of Communication with Friends and Family: More than three times a week    Frequency of Social Gatherings with Friends and Family: Three times a week    Attends Religious Services: More than 4 times per year    Active Member of Clubs or Organizations: No    Attends Banker Meetings: Never    Marital Status: Married  Catering Manager Violence: Not At Risk (12/22/2023)   Humiliation, Afraid, Rape, and Kick questionnaire    Fear of Current or Ex-Partner: No    Emotionally Abused: No    Physically Abused: No    Sexually Abused: No    Family History  Problem Relation Age of Onset   COPD Sister    Coronary artery disease Sister        age 61 also copd   Cancer Mother        bladder   Arthritis Other    Cancer Other        breast   Diabetes Other    Hyperlipidemia Other    Hypertension Other    Stroke Other    Heart disease Other    Coronary artery disease Father        also had AAA   Aneurysm Father        Aortic and thoracic fatal     Review of Systems  Constitutional: Negative.  Negative for chills and fever.  HENT:  Positive for congestion. Negative for ear pain and sore throat.   Respiratory:  Positive for cough.   Cardiovascular: Negative.  Negative for chest pain and palpitations.  Gastrointestinal:  Negative for abdominal pain, diarrhea, nausea and vomiting.  Genitourinary: Negative.  Negative for dysuria and hematuria.  Skin:  Negative for rash.  Neurological: Negative.  Negative for dizziness and headaches.  All other systems reviewed and are negative.   Vitals:   12/29/23 1314 12/29/23 1317  BP: (!) 144/100 (!) 144/100  Pulse: 80   Temp: 97.9 F (36.6 C)   SpO2: 95%     Physical Exam Vitals reviewed.  Constitutional:      Appearance: Normal appearance.   HENT:     Head: Normocephalic.     Right Ear: Tympanic membrane, ear canal and external ear normal.     Left Ear: Tympanic membrane, ear canal and external ear normal.     Mouth/Throat:     Mouth: Mucous membranes are moist.     Pharynx: Oropharynx is clear.  Eyes:     Extraocular Movements: Extraocular movements intact.     Conjunctiva/sclera: Conjunctivae normal.     Pupils: Pupils are equal, round, and reactive to light.  Cardiovascular:     Rate and Rhythm: Normal rate and regular rhythm.     Pulses: Normal pulses.     Heart sounds: Normal heart sounds.  Pulmonary:     Effort: Pulmonary effort is normal.     Breath sounds: Normal breath sounds.  Musculoskeletal:     Cervical back: No tenderness.  Lymphadenopathy:     Cervical: No cervical adenopathy.  Skin:    General: Skin is warm and dry.     Capillary Refill: Capillary refill takes less than 2 seconds.  Neurological:     General: No focal deficit present.     Mental Status: She is alert and oriented to person, place, and time.  Psychiatric:        Mood and Affect: Mood normal.        Behavior: Behavior normal.    Results for orders placed or performed in visit on 12/29/23 (from the past 24 hours)  POCT Influenza A/B     Status: Normal   Collection Time: 12/29/23  1:36 PM  Result Value Ref Range   Influenza A, POC Negative Negative   Influenza B, POC Negative Negative  POC COVID-19     Status: Normal   Collection Time: 12/29/23  1:37 PM  Result Value Ref Range   SARS Coronavirus 2 Ag Negative Negative     ASSESSMENT & PLAN: Problem List Items Addressed This Visit       Respiratory   Bacterial sinusitis - Primary   Upper viral infection now with secondary bacterial infection of sinuses Recommend Augmentin  875 mg twice a day for 7 to 10 days Symptom management discussed Advised to rest and stay well-hydrated      Relevant Medications   amoxicillin -clavulanate (AUGMENTIN ) 875-125 MG tablet   Other  Relevant Orders   POC COVID-19 (Completed)   POCT Influenza A/B (Completed)   Sinus congestion   Recommend over-the-counter decongestant for hypertensive patients Use saline nasal sprays frequently during the day May benefit from Flonase Recommend Nettie pot      Relevant Orders   POC COVID-19 (Completed)   POCT Influenza A/B (Completed)     Other   Flu-like symptoms   Negative COVID and flu tests Symptom management discussed May take Tylenol  and/or Advil as needed Recommend over-the-counter Mucinex  DM Advised to rest and stay well-hydrated      Relevant Orders   POC COVID-19 (Completed)   POCT Influenza A/B (Completed)   Patient Instructions  Sinus Infection, Adult A sinus infection is soreness and swelling (inflammation) of your sinuses. Sinuses are hollow spaces in the bones around your face. They are located: Around your eyes. In the middle of your forehead. Behind your nose. In your cheekbones. Your sinuses and nasal passages are lined with a fluid called mucus. Mucus drains out of your sinuses. Swelling can trap mucus in your sinuses. This lets germs (bacteria, virus, or fungus) grow, which leads  to infection. Most of the time, this condition is caused by a virus. What are the causes? Allergies. Asthma. Germs. Things that block your nose or sinuses. Growths in the nose (nasal polyps). Chemicals or irritants in the air. A fungus. This is rare. What increases the risk? Having a weak body defense system (immune system). Doing a lot of swimming or diving. Using nasal sprays too much. Smoking. What are the signs or symptoms? The main symptoms of this condition are pain and a feeling of pressure around the sinuses. Other symptoms include: Stuffy nose (congestion). This may make it hard to breathe through your nose. Runny nose (drainage). Soreness, swelling, and warmth in the sinuses. A cough that may get worse at night. Being unable to smell and taste. Mucus  that collects in the throat or the back of the nose (postnasal drip). This may cause a sore throat or bad breath. Being very tired (fatigued). A fever. How is this diagnosed? Your symptoms. Your medical history. A physical exam. Tests to find out if your condition is short-term (acute) or long-term (chronic). Your doctor may: Check your nose for growths (polyps). Check your sinuses using a tool that has a light on one end (endoscope). Check for allergies or germs. Do imaging tests, such as an MRI or CT scan. How is this treated? Treatment for this condition depends on the cause and whether it is short-term or long-term. If caused by a virus, your symptoms should go away on their own within 10 days. You may be given medicines to relieve symptoms. They include: Medicines that shrink swollen tissue in the nose. A spray that treats swelling of the nostrils. Rinses that help get rid of thick mucus in your nose (nasal saline washes). Medicines that treat allergies (antihistamines). Over-the-counter pain relievers. If caused by bacteria, your doctor may wait to see if you will get better without treatment. You may be given antibiotic medicine if you have: A very bad infection. A weak body defense system. If caused by growths in the nose, surgery may be needed. Follow these instructions at home: Medicines Take, use, or apply over-the-counter and prescription medicines only as told by your doctor. These may include nasal sprays. If you were prescribed an antibiotic medicine, take it as told by your doctor. Do not stop taking it even if you start to feel better. Hydrate and humidify  Drink enough water to keep your pee (urine) pale yellow. Use a cool mist humidifier to keep the humidity level in your home above 50%. Breathe in steam for 10-15 minutes, 3-4 times a day, or as told by your doctor. You can do this in the bathroom while a hot shower is running. Try not to spend time in cool or dry  air. Rest Rest as much as you can. Sleep with your head raised (elevated). Make sure you get enough sleep each night. General instructions  Put a warm, moist washcloth on your face 3-4 times a day, or as often as told by your doctor. Use nasal saline washes as often as told by your doctor. Wash your hands often with soap and water. If you cannot use soap and water, use hand sanitizer. Do not smoke. Avoid being around people who are smoking (secondhand smoke). Keep all follow-up visits. Contact a doctor if: You have a fever. Your symptoms get worse. Your symptoms do not get better within 10 days. Get help right away if: You have a very bad headache. You cannot stop vomiting. You have  very bad pain or swelling around your face or eyes. You have trouble seeing. You feel confused. Your neck is stiff. You have trouble breathing. These symptoms may be an emergency. Get help right away. Call 911. Do not wait to see if the symptoms will go away. Do not drive yourself to the hospital. Summary A sinus infection is swelling of your sinuses. Sinuses are hollow spaces in the bones around your face. This condition is caused by tissues in your nose that become inflamed or swollen. This traps germs. These can lead to infection. If you were prescribed an antibiotic medicine, take it as told by your doctor. Do not stop taking it even if you start to feel better. Keep all follow-up visits. This information is not intended to replace advice given to you by your health care provider. Make sure you discuss any questions you have with your health care provider. Document Revised: 12/17/2020 Document Reviewed: 12/17/2020 Elsevier Patient Education  2024 Elsevier Inc.    Emil Schaumann, MD [facility]

## 2023-12-29 NOTE — Assessment & Plan Note (Signed)
 Upper viral infection now with secondary bacterial infection of sinuses Recommend Augmentin  875 mg twice a day for 7 to 10 days Symptom management discussed Advised to rest and stay well-hydrated

## 2023-12-29 NOTE — Assessment & Plan Note (Signed)
 Negative COVID and flu tests Symptom management discussed May take Tylenol  and/or Advil as needed Recommend over-the-counter Mucinex  DM Advised to rest and stay well-hydrated

## 2023-12-29 NOTE — Assessment & Plan Note (Signed)
 Recommend over-the-counter decongestant for hypertensive patients Use saline nasal sprays frequently during the day May benefit from [employer] pot

## 2023-12-29 NOTE — Progress Notes (Signed)
 [name] - 73 y.o. female MRN [id number]  Date of birth: [birth year]  Office Visit Note: Visit Date: 12/29/2023 PCP: Joshua Debby CROME, MD Referred by: Joshua Debby CROME, MD  Subjective: Chief Complaint  Patient presents with   Spine - Pain   HPI: [name] is a 73 y.o. female who comes in today per the request of Dr. Debby Joshua for evaluation of chronic, worsening and severe bilateral lower back pain, intermittent pain down both legs.There was some confusion surrounding referral as she was sent to us  for neck pain, however she does not wish to have her neck evaluated today. Her lower back is biggest concern. Lower back pain ongoing for several years. She does have restless leg syndrome, feels lower back and leg pain do not necessarily occur simultaneously. Her leg pain tends to become worse when laying down to sleep. She is taking Requip  and Lyrica  to manage RLS. 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 Her pain worsens with lifting, bending and pulling. Any type of activity causes severe lower back discomfort. She describes pain as aching sensation, currently rates as 8 out of 10. Some relief of pain with home exercise regimen, rest and use of medications. Lumbar MRI imaging from 2024 shows lower lumbar facet arthropathy with mild bilateral foraminal stenosis at L2-L3. Moderate facet arthropathy. Otherwise, no significant canal or foraminal stenosis. She was evaluated by Dr. Prentice Pier at [facility] for lower back pain in 2024. Per his notes, he felt her pain was more facet mediated, recommended physical therapy and possible medial branch blocks. Patient denies focal weakness, numbness and tingling. No recent trauma or falls.         Review of Systems  Musculoskeletal:  Positive for back pain.  Neurological:  Positive for tingling. Negative for sensory change, focal weakness and weakness.  All other systems reviewed and are negative.  Otherwise per  HPI.  Assessment & Plan: Visit Diagnoses:    ICD-10-CM   1. Chronic bilateral low back pain without sciatica  M54.50 Ambulatory referral to Physical Therapy   G89.29     2. Spondylosis without myelopathy or radiculopathy  M47.819 Ambulatory referral to Physical Therapy    3. Facet arthropathy, lumbar  M47.816 Ambulatory referral to Physical Therapy    4. Bilateral leg pain  M79.604 Ambulatory referral to Physical Therapy   M79.605     5. Paresthesia of skin  R20.2 Ambulatory referral to Physical Therapy       Plan: Findings:  Chronic, worsening and severe bilateral lower back pain, intermittent pain down both legs. Bilateral lower back is biggest pain generator. I do feel her bilateral lower extremity pain/paresthesias is likely related to restless leg syndrome. No significant nerve impingement noted on prior lumbar MRI imaging from 2024. There is moderate facet arthropathy at the level of L5-S1 that could cause axial back pain. She was recently started on Lyrica  by Dr. Joshua, feels this medication is helping to better manage her RLS. We discussed treatment plan in detail today. She would like to avoid interventional spine procedures (injections) as she has fear of needles. I placed order to start short course of formal physical therapy. I do think she would benefit from core strengthening and establishing home exercise regimen. Should her pain persist I discussed possible lumbar medial branch blocks and longer sustained relief with radiofrequency ablation. Her exam today is non focal, good strength noted to bilateral lower extremities.   We are happy to see  her back for neck as needed. Would consider performing cervical epidural steroid injection if her pain presents as more radicular in nature.     Meds & Orders: No orders of the defined types were placed in this encounter.   Orders Placed This Encounter  Procedures   Ambulatory referral to Physical Therapy    Follow-up: Return for 8  week follow up post PT.   Procedures: No procedures performed      Clinical History: CLINICAL DATA:  Lumbar radiculopathy, symptoms persist with > 6 wks treatment   EXAM: MRI LUMBAR SPINE WITHOUT CONTRAST   TECHNIQUE: Multiplanar, multisequence MR imaging of the lumbar spine was performed. No intravenous contrast was administered.   COMPARISON:  Lumbar radiographs 09/07/2022.   FINDINGS: Segmentation:  Standard.   Alignment:  No substantial sagittal subluxation.   Vertebrae:  No fracture, evidence of discitis, or bone lesion.   Conus medullaris and cauda equina: Conus extends to the T12-L1 level. Conus and cauda equina appear normal.   Paraspinal and other soft tissues: Unremarkable.   Disc levels:   T12-L1: No significant disc protrusion, foraminal stenosis, or canal stenosis.   L1-L2: No significant disc protrusion, foraminal stenosis, or canal stenosis.   L2-L3: Broad disc bulge with small foraminal disc protrusions bilaterally. Resulting mild bilateral foraminal stenosis. No significant canal stenosis.   L3-L4: Mild disc bulge. Bilateral facet arthropathy. No significant canal or foraminal stenosis.   L4-L5: Mild disc bulging.  No significant foraminal stenosis.   L5-S1: Moderate facet arthropathy.  No significant stenosis.   IMPRESSION: Lower lumbar facet arthropathy with mild bilateral foraminal stenosis at L2-L3. Otherwise, no significant canal or foraminal stenosis.     Electronically Signed   By: Gilmore GORMAN Molt M.D.   On: 09/21/2022 11:10   She reports that she has never smoked. She has never used smokeless tobacco.  Recent Labs    01/11/23 0830 09/22/23 0922 12/22/23 0817  HGBA1C 6.6* 8.0* 8.2*    Objective:  VS:  HT:    WT:   BMI:     BP:   HR: bpm  TEMP: ( )  RESP:  Physical Exam Vitals and nursing note reviewed.  HENT:     Head: Normocephalic and atraumatic.     Right Ear: External ear normal.     Left Ear: External ear  normal.     Nose: Nose normal.     Mouth/Throat:     Mouth: Mucous membranes are moist.  Eyes:     Extraocular Movements: Extraocular movements intact.  Cardiovascular:     Rate and Rhythm: Normal rate.     Pulses: Normal pulses.  Pulmonary:     Effort: Pulmonary effort is normal.  Abdominal:     General: Abdomen is flat. There is no distension.  Musculoskeletal:        General: Tenderness present.     Cervical back: Normal range of motion.     Comments: Patient rises from seated position to standing without difficulty. Mild pain noted with facet loading and lumbar extension. 5/5 strength noted with bilateral hip flexion, knee flexion/extension, ankle dorsiflexion/plantarflexion and EHL. No clonus noted bilaterally. No pain upon palpation of greater trochanters. No pain with internal/external rotation of bilateral hips. Sensation intact bilaterally. Negative slump test bilaterally. Ambulates without aid, gait steady.     Skin:    General: Skin is warm and dry.     Capillary Refill: Capillary refill takes less than 2 seconds.  Neurological:     General:  No focal deficit present.     Mental Status: She is alert and oriented to person, place, and time.  Psychiatric:        Mood and Affect: Mood normal.        Behavior: Behavior normal.     Ortho Exam  Imaging: No results found.  Past Medical/Family/Surgical/Social History: Medications & Allergies reviewed per EMR, new medications updated. Patient Active Problem List   Diagnosis Date Noted   Radiculitis of right cervical region 12/21/2023   DDD (degenerative disc disease), cervical 12/21/2023   Metabolic dysfunction-associated fatty liver disease (MAFLD) 12/21/2023   Diabetes mellitus due to underlying condition with diabetic neuropathy, without long-term current use of insulin  (HCC) 10/08/2023   Need for prophylactic vaccination with combined diphtheria-tetanus-pertussis (DTP) vaccine 09/22/2023   Need for prophylactic  vaccination and inoculation against varicella 09/22/2023   Dyslipidemia, goal LDL below 70 01/06/2022   Stage 3a chronic kidney disease (HCC) 01/06/2022   Obstructive sleep apnea 09/08/2021   Primary hypertension 06/30/2021   Osteopenia 04/25/2010   CATARACT, LEFT EYE 11/27/2009   Obesity (BMI 30-39.9) 01/12/2008   VITAMIN D  DEFICIENCY 11/25/2006   Hypothyroidism 10/04/2006   RLS (restless legs syndrome) 10/04/2006   GERD 09/30/2006   Past Medical History:  Diagnosis Date   Acute pyelonephritis 05/02/2012   Asthma    Diverticulitis 03/24/2017   see report [facility] [place] surgery   Dyspnea    nl PFTs, and echo   GERD (gastroesophageal reflux disease)    Headache(784.0)    Heart murmur    History of echocardiogram    Echocardiogram 7/23: EF 65-70, no RWMA, Gr 1 DD, normal RVSF, inadequate TR signal to est PASP, trivial MR, AV sclerosis w/o AS   History of kidney stones    IBS (irritable bowel syndrome)    Nephrolithiasis    Pre-diabetes 12/2013   Recurrent oral ulcers    Thyroid  goiter    I 131 rx and then  total throidectomy 2005 [facility]   Family History  Problem Relation Age of Onset   COPD Sister    Coronary artery disease Sister        age 20 also copd   Cancer Mother        bladder   Arthritis Other    Cancer Other        breast   Diabetes Other    Hyperlipidemia Other    Hypertension Other    Stroke Other    Heart disease Other    Coronary artery disease Father        also had AAA   Aneurysm Father        Aortic and thoracic fatal   Past Surgical History:  Procedure Laterality Date   ABDOMINAL HYSTERECTOMY  01/27/1992   fibroid   APPENDECTOMY  01/26/1966   CYSTOSCOPY WITH STENT PLACEMENT Bilateral 03/24/2017   Procedure: BILATERAL URETERAL CATHETER  PLACEMENT;  Surgeon: Alvaro Hummer, MD;  Location: [facility];  Service: Urology;  Laterality: Bilateral;   CYSTOSCOPY/RETROGRADE/URETEROSCOPY  03/24/2017   Procedure: CYSTOSCOPY/RETROGRADE/URETEROSCOPY;   Surgeon: Alvaro Hummer, MD;  Location: [facility];  Service: Urology;;   LAPAROSCOPIC PARTIAL COLECTOMY Left 03/24/2017   Procedure: LAPAROSCOPIC ASSISTED LEFT  SIGMOID COLECTOMY;  Surgeon: Ethyl Lenis, MD;  Location: [facility];  Service: General;  Laterality: Left;   orif left tivial plateau fracture     Dr. Carlos 2/08   plates and pins take out of left knee  06/26/2008   RIGHT/LEFT HEART CATH AND CORONARY ANGIOGRAPHY N/A  07/24/2021   Procedure: RIGHT/LEFT HEART CATH AND CORONARY ANGIOGRAPHY;  Surgeon: Burnard Debby LABOR, MD;  [facility];  Service: Cardiovascular;  Laterality: N/A;   SIGMOIDOSCOPY  03/24/2017   Procedure: SIGMOIDOSCOPY;  Surgeon: Ethyl Lenis, MD;  Location: [facility];  Service: General;;   TONSILLECTOMY  01/26/1977   TOTAL KNEE REVISION Left 2010   TOTAL KNEE REVISION Right 2022   TOTAL THYROIDECTOMY  01/27/2003   for  large nodule 2006   Social History   Occupational History   Occupation: private duty/part time  Tobacco Use   Smoking status: Never   Smokeless tobacco: Never  Vaping Use   Vaping status: Never Used  Substance and Sexual Activity   Alcohol use: Yes    Comment: seldom   Drug use: No   Sexual activity: Yes

## 2023-12-29 NOTE — Patient Instructions (Signed)

## 2023-12-31 ENCOUNTER — Other Ambulatory Visit: Admitting: Pharmacist

## 2023-12-31 DIAGNOSIS — E119 Type 2 diabetes mellitus without complications: Secondary | ICD-10-CM

## 2023-12-31 NOTE — Progress Notes (Unsigned)
   12/31/2023 Name: [name] MRN: [id number] DOB: February 04, 1950  No chief complaint on file.   {Visit Ubez:73349}   Subjective:  Care Team: Primary Care Provider: Joshua Debby CROME, MD ; Next Scheduled Visit: *** {careteamprovider:27366}  Medication Access/Adherence  Current Pharmacy:  [facility] [id number] - [place], [place] - [address] [place] [address] Phone: [phone] Fax: [phone]  [facility] - [place], [place] - [address] [place] [address] Phone: [phone] Fax: [phone]  [facility] - [place], [place] - [address] [place] [address] Phone: [phone] Fax: [phone]  [id number] - [place], [place] - [address] AT [facility] [address] [place] [address] Phone: [phone] Fax: [phone]   Patient reports affordability concerns with their medications: {YES/NO:21197} Patient reports access/transportation concerns to their pharmacy: {YES/NO:21197} Patient reports adherence concerns with their medications:  {YES/NO:21197} ***   {Pharmacy S/O Choices:26420}   Objective:  Lab Results  Component Value Date   HGBA1C 8.2 (H) 12/22/2023    Lab Results  Component Value Date   CREATININE 0.74 09/22/2023   BUN 14 09/22/2023   NA 141 09/22/2023   K 3.8 09/22/2023   CL 102 09/22/2023   CO2 28 09/22/2023    Lab Results  Component Value Date   CHOL 263 (H) 09/22/2023   HDL 44.30 09/22/2023   LDLCALC 169 (H) 09/22/2023   LDLDIRECT 142.0 01/28/2016   TRIG 248.0 (H) 09/22/2023   CHOLHDL 6 09/22/2023    Medications Reviewed Today   Medications were not reviewed in this encounter       Assessment/Plan:   {Pharmacy A/P Choices:26421}  Follow Up Plan: ***  ***

## 2024-01-12 NOTE — Therapy (Incomplete)
 OUTPATIENT PHYSICAL THERAPY THORACOLUMBAR EVALUATION   Patient Name: [name]  [place] MRN: [id number] DOB:12/12/1950, 73 y.o., female Today's Date: 01/12/2024  END OF SESSION:   Past Medical History:  Diagnosis Date   Acute pyelonephritis 05/02/2012   Asthma    Diverticulitis 03/24/2017   see report [facility] [place] surgery   Dyspnea    nl PFTs, and echo   GERD (gastroesophageal reflux disease)    Headache(784.0)    Heart murmur    History of echocardiogram    Echocardiogram 7/23: EF 65-70, no RWMA, Gr 1 DD, normal RVSF, inadequate TR signal to est PASP, trivial MR, AV sclerosis w/o AS   History of kidney stones    IBS (irritable bowel syndrome)    Nephrolithiasis    Pre-diabetes 12/2013   Recurrent oral ulcers    Thyroid  goiter    I 131 rx and then  total throidectomy 2005 [facility]   Past Surgical History:  Procedure Laterality Date   ABDOMINAL HYSTERECTOMY  01/27/1992   fibroid   APPENDECTOMY  01/26/1966   CYSTOSCOPY WITH STENT PLACEMENT Bilateral 03/24/2017   Procedure: BILATERAL URETERAL CATHETER  PLACEMENT;  Surgeon: Alvaro Hummer, MD;  Location: [facility];  Service: Urology;  Laterality: Bilateral;   CYSTOSCOPY/RETROGRADE/URETEROSCOPY  03/24/2017   Procedure: CYSTOSCOPY/RETROGRADE/URETEROSCOPY;  Surgeon: Alvaro Hummer, MD;  Location: [facility];  Service: Urology;;   LAPAROSCOPIC PARTIAL COLECTOMY Left 03/24/2017   Procedure: LAPAROSCOPIC ASSISTED LEFT  SIGMOID COLECTOMY;  Surgeon: Ethyl Lenis, MD;  Location: [facility];  Service: General;  Laterality: Left;   orif left tivial plateau fracture     Dr. Carlos 2/08   plates and pins take out of left knee  06/26/2008   RIGHT/LEFT HEART CATH AND CORONARY ANGIOGRAPHY N/A 07/24/2021   Procedure: RIGHT/LEFT HEART CATH AND CORONARY ANGIOGRAPHY;  Surgeon: Burnard Debby LABOR, MD;  Location: [facility];  Service: Cardiovascular;  Laterality: N/A;   SIGMOIDOSCOPY  03/24/2017   Procedure: SIGMOIDOSCOPY;  Surgeon: Ethyl Lenis, MD;  Location: [facility];  Service: General;;   TONSILLECTOMY  01/26/1977   TOTAL KNEE REVISION Left 2010   TOTAL KNEE REVISION Right 2022   TOTAL THYROIDECTOMY  01/27/2003   for  large nodule 2006   Patient Active Problem List   Diagnosis Date Noted   Insulin -requiring or dependent type II diabetes mellitus (HCC) 12/29/2023   Radiculitis of right cervical region 12/21/2023   DDD (degenerative disc disease), cervical 12/21/2023   Metabolic dysfunction-associated fatty liver disease (MAFLD) 12/21/2023   Diabetes mellitus due to underlying condition with diabetic neuropathy, without long-term current use of insulin  (HCC) 10/08/2023   Need for prophylactic vaccination with combined diphtheria-tetanus-pertussis (DTP) vaccine 09/22/2023   Need for prophylactic vaccination and inoculation against varicella 09/22/2023   Dyslipidemia, goal LDL below 70 01/06/2022   Stage 3a chronic kidney disease (HCC) 01/06/2022   Obstructive sleep apnea 09/08/2021   Primary hypertension 06/30/2021   Osteopenia 04/25/2010   CATARACT, LEFT EYE 11/27/2009   Obesity (BMI 30-39.9) 01/12/2008   VITAMIN D  DEFICIENCY 11/25/2006   Hypothyroidism 10/04/2006   RLS (restless legs syndrome) 10/04/2006   GERD 09/30/2006    PCP: Debby Molt, MD   REFERRING PROVIDER: Duwaine Pouch, NP  REFERRING DIAG: M54.50,G89.29 (ICD-10-CM) - Chronic bilateral low back pain without sciatica M47.819 (ICD-10-CM) - Spondylosis without myelopathy or radiculopathy M47.816 (ICD-10-CM) - Facet arthropathy, lumbar M79.604,M79.605 (ICD-10-CM) - Bilateral leg pain R20.2 (ICD-10-CM) - Paresthesia of skin  Rationale for Evaluation and Treatment: Rehabilitation  THERAPY DIAG:  No diagnosis found.  ONSET  DATE: ***  SUBJECTIVE:                                                                                                                                                                                           SUBJECTIVE  STATEMENT: ***  PERTINENT HISTORY:  ***  PAIN:  Are you having pain? Yes: NPRS scale: *** Pain location: *** Pain description: *** Aggravating factors: *** Relieving factors: ***  PRECAUTIONS: {Therapy precautions:24002}  RED FLAGS: {PT Red Flags:29287}   WEIGHT BEARING RESTRICTIONS: {Yes ***/No:24003}  FALLS:  Has patient fallen in last 6 months? {fallsyesno:27318}  LIVING ENVIRONMENT: Lives with: {[facility] lives with:25569::lives with their family} Lives in: {Lives in:25570} Stairs: {opstairs:27293} Has following equipment at home: {Assistive devices:23999}  OCCUPATION: ***  PLOF: {PLOF:24004}  PATIENT GOALS: ***  NEXT MD VISIT: ***  OBJECTIVE:  Note: Objective measures were completed at Evaluation unless otherwise noted.  DIAGNOSTIC FINDINGS:  ***  PATIENT SURVEYS:  PSFS: THE PATIENT SPECIFIC FUNCTIONAL SCALE  Place score of 0-10 (0 = unable to perform activity and 10 = able to perform activity at the same level as before injury or problem)  Activity Date: 01/13/2024         2.     3.     4.      Total Score ***      Total Score = Sum of activity scores/number of activities  Minimally Detectable Change: 3 points (for single activity); 2 points (for average score)  [facility] (nd). The Patient Specific Functional Scale . Retrieved from [email or web address]   COGNITION: Overall cognitive status: {cognition:24006}     SENSATION: {sensation:27233}  MUSCLE LENGTH: Hamstrings: Right *** deg; Left *** deg Debby test: Right *** deg; Left *** deg  POSTURE: {posture:25561}  PALPATION: ***  LUMBAR ROM:   AROM Eval 01/13/2024  Flexion   Extension   Right lateral flexion   Left lateral flexion   Right rotation   Left rotation    (Blank rows = not tested)  LOWER EXTREMITY ROM:     {AROM/PROM:27142}  Right Eval 01/13/2024 Left Eval 01/13/2024  Hip flexion    Hip  extension    Hip abduction    Hip adduction    Hip internal rotation    Hip external rotation    Knee flexion    Knee extension    Ankle dorsiflexion    Ankle plantarflexion    Ankle inversion    Ankle eversion     (Blank rows = not tested)  LOWER EXTREMITY MMT:    MMT Right Eval 01/13/2024 Left Eval 01/13/2024  Hip flexion  Hip extension    Hip abduction    Hip adduction    Hip internal rotation    Hip external rotation    Knee flexion    Knee extension    Ankle dorsiflexion    Ankle plantarflexion    Ankle inversion    Ankle eversion     (Blank rows = not tested)  LUMBAR SPECIAL TESTS:  {lumbar special test:25242}  FUNCTIONAL TESTS:  {Functional tests:24029}  GAIT: Distance walked: *** Assistive device utilized: {Assistive devices:23999} Level of assistance: {Levels of assistance:24026} Comments: ***  TREATMENT DATE:  01/13/2024 TherEx:    HEP handout provided with patient performing one set of each activity for appropriate form. Verbal and tactile cues provided.   Self-Care:  POC, ***                                                                                                                               PATIENT EDUCATION:  Education details: *** Person educated: {Person educated:25204} Education method: {Education Method:25205} Education comprehension: {Education Comprehension:25206}  HOME EXERCISE PROGRAM: ***  ASSESSMENT:  CLINICAL IMPRESSION: Patient is a 73 y.o. F who was seen today for physical therapy evaluation and treatment for *** presenting with ***. Patient will benefit from skilled PT to address above noted deficits.   OBJECTIVE IMPAIRMENTS: {opptimpairments:25111}.   ACTIVITY LIMITATIONS: {activitylimitations:27494}  PARTICIPATION LIMITATIONS: {participationrestrictions:25113}  PERSONAL FACTORS: {Personal factors:25162} are also affecting patient's functional outcome.   REHAB POTENTIAL:  {rehabpotential:25112}  CLINICAL DECISION MAKING: {clinical decision making:25114}  EVALUATION COMPLEXITY: {Evaluation complexity:25115}   GOALS: Goals reviewed with patient? Yes  SHORT TERM GOALS: Target date: ***  Patient will show compliance with initial HEP. Baseline: Goal status: INITIAL  2.  Patient will report pain levels no greater than ***/10 in order to show an improved overall quality of life. Baseline:  Goal status: INITIAL  3.  *** Baseline:  Goal status: INITIAL  4.  *** Baseline:  Goal status: INITIAL  5.  *** Baseline:  Goal status: INITIAL  6.  *** Baseline:  Goal status: INITIAL  LONG TERM GOALS: Target date: ***  Patient will be independent with final HEP in order to maintain and progress upon functional gains made within PT. Baseline:  Goal status: INITIAL  2.  Patient will report pain levels no greater than ***/10 in order to show an improved overall quality of life. Baseline:  Goal status: INITIAL  3.  Patient will increase PSFS to at least *** in order to show a significant improvement in subjective disability rating. Baseline:  Goal status: INITIAL  4.  *** Baseline:  Goal status: INITIAL  5.  *** Baseline:  Goal status: INITIAL  6.  *** Baseline:  Goal status: INITIAL  PLAN:  PT FREQUENCY: {rehab frequency:25116}  PT DURATION: {rehab duration:25117}  PLANNED INTERVENTIONS: 97164- PT Re-evaluation, 97750- Physical Performance Testing, 97110-Therapeutic exercises, 97530- Therapeutic activity, [id number]- Neuromuscular re-education, 97535- Self Care, 02859- Manual therapy, [id number]- Gait training,  02239- Orthotic Initial, 02236- Orthotic/Prosthetic subsequent, [phone]- Canalith repositioning, [id number]- Aquatic Therapy, [phone]- Electrical stimulation (unattended), [phone]- Electrical stimulation (manual), [id number]- Vasopneumatic device, [id number]- Ultrasound, [id number]- Traction (mechanical), [id number]- Ionotophoresis 4mg /ml Dexamethasone , 79439 (1-2  muscles), 20561 (3+ muscles)- Dry Needling, Patient/Family education, Balance training, Stair training, Taping, Joint mobilization, Joint manipulation, Spinal manipulation, Spinal mobilization, Vestibular training, DME instructions, Cryotherapy, and Moist heat.  PLAN FOR NEXT SESSION: review HEP, ***   Susannah Daring, PT, DPT 01/12/2024 1:31 PM

## 2024-01-13 ENCOUNTER — Telehealth: Payer: Self-pay

## 2024-01-13 ENCOUNTER — Ambulatory Visit

## 2024-01-13 NOTE — Telephone Encounter (Signed)
 Pharmacy Patient Advocate Encounter   Received notification from [employer] that prior authorization for[employer] verification completed.   The patient is insured through [facility].   Per test claim: Medication is not eligible for pharmacy benefits and must be billed through medical NOT COV UNDER PART D LAW MAY BE COV UND PART B'NON-FORMULARY DRUG, . As our team only handles pharmacy related prior auths, medical PA's must be submitted by the MEDICARE PART B. Thank you PLEASE BE ADVISE PT NEEDS TO CALL PHARMACY TO GET THEM TO PROCESS THROUGH. DIABETIC SUPPLIES MAY GO THROUGH MEDICARE PART B

## 2024-01-13 NOTE — Telephone Encounter (Signed)
 Patient has been made aware and gave a verbal understanding.

## 2024-01-25 NOTE — Therapy (Incomplete)
 " OUTPATIENT PHYSICAL THERAPY THORACOLUMBAR EVALUATION   Patient Name: Shemeika Starzyk MRN: [id number] DOB:08-14-1950, 73 y.o., female Today's Date: 01/25/2024  END OF SESSION:   Past Medical History:  Diagnosis Date   Acute pyelonephritis 05/02/2012   Asthma    Diverticulitis 03/24/2017   see report [facility]   Dyspnea    nl PFTs, and echo   GERD (gastroesophageal reflux disease)    Headache(784.0)    Heart murmur    History of echocardiogram    Echocardiogram 7/23: EF 65-70, no RWMA, Gr 1 DD, normal RVSF, inadequate TR signal to est PASP, trivial MR, AV sclerosis w/o AS   History of kidney stones    IBS (irritable bowel syndrome)    Nephrolithiasis    Pre-diabetes 12/2013   Recurrent oral ulcers    Thyroid  goiter    I 131 rx and then  total throidectomy 2005 [facility]   Past Surgical History:  Procedure Laterality Date   ABDOMINAL HYSTERECTOMY  01/27/1992   fibroid   APPENDECTOMY  01/26/1966   CYSTOSCOPY WITH STENT PLACEMENT Bilateral 03/24/2017   Procedure: BILATERAL URETERAL CATHETER  PLACEMENT;  Surgeon: Alvaro Hummer, MD;  Location: [facility];  Service: Urology;  Laterality: Bilateral;   CYSTOSCOPY/RETROGRADE/URETEROSCOPY  03/24/2017   Procedure: CYSTOSCOPY/RETROGRADE/URETEROSCOPY;  Surgeon: Alvaro Hummer, MD;  Location: [facility];  Service: Urology;;   LAPAROSCOPIC PARTIAL COLECTOMY Left 03/24/2017   Procedure: LAPAROSCOPIC ASSISTED LEFT  SIGMOID COLECTOMY;  Surgeon: Ethyl Lenis, MD;  Location: [facility];  Service: General;  Laterality: Left;   orif left tivial plateau fracture     Dr. Carlos 2/08   plates and pins take out of left knee  06/26/2008   RIGHT/LEFT HEART CATH AND CORONARY ANGIOGRAPHY N/A 07/24/2021   Procedure: RIGHT/LEFT HEART CATH AND CORONARY ANGIOGRAPHY;  Surgeon: Burnard Debby LABOR, MD;  Location: [facility];  Service: Cardiovascular;  Laterality: N/A;   SIGMOIDOSCOPY  03/24/2017   Procedure: SIGMOIDOSCOPY;  Surgeon: Ethyl Lenis, MD;  Location: [facility];  Service: General;;   TONSILLECTOMY  01/26/1977   TOTAL KNEE REVISION Left 2010   TOTAL KNEE REVISION Right 2022   TOTAL THYROIDECTOMY  01/27/2003   for  large nodule 2006   Patient Active Problem List   Diagnosis Date Noted   Insulin -requiring or dependent type II diabetes mellitus (HCC) 12/29/2023   Radiculitis of right cervical region 12/21/2023   DDD (degenerative disc disease), cervical 12/21/2023   Metabolic dysfunction-associated fatty liver disease (MAFLD) 12/21/2023   Diabetes mellitus due to underlying condition with diabetic neuropathy, without long-term current use of insulin  (HCC) 10/08/2023   Need for prophylactic vaccination with combined diphtheria-tetanus-pertussis (DTP) vaccine 09/22/2023   Need for prophylactic vaccination and inoculation against varicella 09/22/2023   Dyslipidemia, goal LDL below 70 01/06/2022   Stage 3a chronic kidney disease (HCC) 01/06/2022   Obstructive sleep apnea 09/08/2021   Primary hypertension 06/30/2021   Osteopenia 04/25/2010   CATARACT, LEFT EYE 11/27/2009   Obesity (BMI 30-39.9) 01/12/2008   VITAMIN D  DEFICIENCY 11/25/2006   Hypothyroidism 10/04/2006   RLS (restless legs syndrome) 10/04/2006   GERD 09/30/2006    PCP: Debby Molt, MD   REFERRING PROVIDER: Duwaine Pouch, NP  REFERRING DIAG: M54.50,G89.29 (ICD-10-CM) - Chronic bilateral low back pain without sciatica M47.819 (ICD-10-CM) - Spondylosis without myelopathy or radiculopathy M47.816 (ICD-10-CM) - Facet arthropathy, lumbar M79.604,M79.605 (ICD-10-CM) - Bilateral leg pain R20.2 (ICD-10-CM) - Paresthesia of skin  Rationale for Evaluation and Treatment: Rehabilitation  THERAPY DIAG:  No diagnosis found.  ONSET DATE: ***  SUBJECTIVE:                                                                                                                                                                                           SUBJECTIVE  STATEMENT: ***  PERTINENT HISTORY:  ***  PAIN:  Are you having pain? Yes: NPRS scale: *** Pain location: *** Pain description: *** Aggravating factors: *** Relieving factors: ***  PRECAUTIONS: {Therapy precautions:24002}  RED FLAGS: {PT Red Flags:29287}   WEIGHT BEARING RESTRICTIONS: {Yes ***/No:24003}  FALLS:  Has patient fallen in last 6 months? {fallsyesno:27318}  LIVING ENVIRONMENT: Lives with: {[facility] lives with:25569::lives with their family} Lives in: {Lives in:25570} Stairs: {opstairs:27293} Has following equipment at home: {Assistive devices:23999}  OCCUPATION: ***  PLOF: {PLOF:24004}  PATIENT GOALS: ***  NEXT MD VISIT: ***  OBJECTIVE:  Note: Objective measures were completed at Evaluation unless otherwise noted.  DIAGNOSTIC FINDINGS:  ***  PATIENT SURVEYS:  PSFS: THE PATIENT SPECIFIC FUNCTIONAL SCALE  Place score of 0-10 (0 = unable to perform activity and 10 = able to perform activity at the same level as before injury or problem)  Activity Date: 01/26/2024         2.     3.     4.      Total Score ***      Total Score = Sum of activity scores/number of activities  Minimally Detectable Change: 3 points (for single activity); 2 points (for average score)  [facility] (nd). The Patient Specific Functional Scale . Retrieved from [email or web address]   COGNITION: Overall cognitive status: {cognition:24006}     SENSATION: {sensation:27233}  MUSCLE LENGTH: Hamstrings: Right *** deg; Left *** deg Debby test: Right *** deg; Left *** deg  POSTURE: {posture:25561}  PALPATION: ***  LUMBAR ROM:   AROM Eval 01/26/2024  Flexion   Extension   Right lateral flexion   Left lateral flexion   Right rotation   Left rotation    (Blank rows = not tested)  LOWER EXTREMITY ROM:     {AROM/PROM:27142}  Right Eval 01/26/2024 Left Eval 01/26/2024  Hip flexion    Hip  extension    Hip abduction    Hip adduction    Hip internal rotation    Hip external rotation    Knee flexion    Knee extension    Ankle dorsiflexion    Ankle plantarflexion    Ankle inversion    Ankle eversion     (Blank rows = not tested)  LOWER EXTREMITY MMT:    MMT Right Eval 01/26/2024 Left Eval 01/26/2024  Hip flexion  Hip extension    Hip abduction    Hip adduction    Hip internal rotation    Hip external rotation    Knee flexion    Knee extension    Ankle dorsiflexion    Ankle plantarflexion    Ankle inversion    Ankle eversion     (Blank rows = not tested)  LUMBAR SPECIAL TESTS:  {lumbar special test:25242}  FUNCTIONAL TESTS:  {Functional tests:24029}  GAIT: Distance walked: *** Assistive device utilized: {Assistive devices:23999} Level of assistance: {Levels of assistance:24026} Comments: ***  TREATMENT DATE:  01/13/2024 TherEx:    HEP handout provided with patient performing one set of each activity for appropriate form. Verbal and tactile cues provided.   Self-Care:  POC, ***                                                                                                                               PATIENT EDUCATION:  Education details: *** Person educated: {Person educated:25204} Education method: {Education Method:25205} Education comprehension: {Education Comprehension:25206}  HOME EXERCISE PROGRAM: ***  ASSESSMENT:  CLINICAL IMPRESSION: Patient is a 73 y.o. F who was seen today for physical therapy evaluation and treatment for *** presenting with ***. Patient will benefit from skilled PT to address above noted deficits.   OBJECTIVE IMPAIRMENTS: {opptimpairments:25111}.   ACTIVITY LIMITATIONS: {activitylimitations:27494}  PARTICIPATION LIMITATIONS: {participationrestrictions:25113}  PERSONAL FACTORS: {Personal factors:25162} are also affecting patient's functional outcome.   REHAB POTENTIAL:  {rehabpotential:25112}  CLINICAL DECISION MAKING: {clinical decision making:25114}  EVALUATION COMPLEXITY: {Evaluation complexity:25115}   GOALS: Goals reviewed with patient? Yes  SHORT TERM GOALS: Target date: ***  Patient will show compliance with initial HEP. Baseline: Goal status: INITIAL  2.  Patient will report pain levels no greater than ***/10 in order to show an improved overall quality of life. Baseline:  Goal status: INITIAL  3.  *** Baseline:  Goal status: INITIAL  4.  *** Baseline:  Goal status: INITIAL  5.  *** Baseline:  Goal status: INITIAL  6.  *** Baseline:  Goal status: INITIAL  LONG TERM GOALS: Target date: ***  Patient will be independent with final HEP in order to maintain and progress upon functional gains made within PT. Baseline:  Goal status: INITIAL  2.  Patient will report pain levels no greater than ***/10 in order to show an improved overall quality of life. Baseline:  Goal status: INITIAL  3.  Patient will increase PSFS to at least *** in order to show a significant improvement in subjective disability rating. Baseline:  Goal status: INITIAL  4.  *** Baseline:  Goal status: INITIAL  5.  *** Baseline:  Goal status: INITIAL  6.  *** Baseline:  Goal status: INITIAL  PLAN:  PT FREQUENCY: {rehab frequency:25116}  PT DURATION: {rehab duration:25117}  PLANNED INTERVENTIONS: 97164- PT Re-evaluation, 97750- Physical Performance Testing, 97110-Therapeutic exercises, 97530- Therapeutic activity, [id number]- Neuromuscular re-education, 97535- Self Care, 02859- Manual therapy, [id number]- Gait training,  02239- Orthotic Initial, 02236- Orthotic/Prosthetic subsequent, [phone]- Canalith repositioning, [id number]- Aquatic Therapy, [phone]- Electrical stimulation (unattended), [phone]- Electrical stimulation (manual), [id number]- Vasopneumatic device, [id number]- Ultrasound, [id number]- Traction (mechanical), [id number]- Ionotophoresis 4mg /ml Dexamethasone , 79439 (1-2  muscles), 20561 (3+ muscles)- Dry Needling, Patient/Family education, Balance training, Stair training, Taping, Joint mobilization, Joint manipulation, Spinal manipulation, Spinal mobilization, Vestibular training, DME instructions, Cryotherapy, and Moist heat.  PLAN FOR NEXT SESSION: review HEP, ***   Susannah Daring, PT, DPT 01/25/2024 8:20 AM    "

## 2024-01-26 ENCOUNTER — Ambulatory Visit

## 2024-02-03 NOTE — Therapy (Signed)
 " OUTPATIENT PHYSICAL THERAPY THORACOLUMBAR EVALUATION   Patient Name: Lenka Zhao MRN: [id number] DOB:April 26, 1950, 74 y.o., female Today's Date: 02/04/2024  END OF SESSION:  PT End of Session - 02/04/24 0847     Visit Number 1    Number of Visits 16    Date for Recertification  03/31/24    Authorization Type MEDICARE    Progress Note Due on Visit 10    PT Start Time 0848    PT Stop Time 0927    PT Time Calculation (min) 39 min    Activity Tolerance Patient tolerated treatment well    Behavior During Therapy [facility] for tasks assessed/performed          Past Medical History:  Diagnosis Date   Acute pyelonephritis 05/02/2012   Asthma    Diverticulitis 03/24/2017   see report [facility]   Dyspnea    nl PFTs, and echo   GERD (gastroesophageal reflux disease)    Headache(784.0)    Heart murmur    History of echocardiogram    Echocardiogram 7/23: EF 65-70, no RWMA, Gr 1 DD, normal RVSF, inadequate TR signal to est PASP, trivial MR, AV sclerosis w/o AS   History of kidney stones    IBS (irritable bowel syndrome)    Nephrolithiasis    Pre-diabetes 12/2013   Recurrent oral ulcers    Thyroid  goiter    I 131 rx and then  total throidectomy 2005 [facility]   Past Surgical History:  Procedure Laterality Date   ABDOMINAL HYSTERECTOMY  01/27/1992   fibroid   APPENDECTOMY  01/26/1966   CYSTOSCOPY WITH STENT PLACEMENT Bilateral 03/24/2017   Procedure: BILATERAL URETERAL CATHETER  PLACEMENT;  Surgeon: Alvaro Hummer, MD;  Location: [facility];  Service: Urology;  Laterality: Bilateral;   CYSTOSCOPY/RETROGRADE/URETEROSCOPY  03/24/2017   Procedure: CYSTOSCOPY/RETROGRADE/URETEROSCOPY;  Surgeon: Alvaro Hummer, MD;  Location: [facility];  Service: Urology;;   LAPAROSCOPIC PARTIAL COLECTOMY Left 03/24/2017   Procedure: LAPAROSCOPIC ASSISTED LEFT  SIGMOID COLECTOMY;  Surgeon: Ethyl Lenis, MD;  Location: [facility];  Service: General;  Laterality: Left;   orif left tivial plateau  fracture     Dr. Carlos 2/08   plates and pins take out of left knee  06/26/2008   RIGHT/LEFT HEART CATH AND CORONARY ANGIOGRAPHY N/A 07/24/2021   Procedure: RIGHT/LEFT HEART CATH AND CORONARY ANGIOGRAPHY;  Surgeon: Burnard Debby LABOR, MD;  [facility];  Service: Cardiovascular;  Laterality: N/A;   SIGMOIDOSCOPY  03/24/2017   Procedure: SIGMOIDOSCOPY;  Surgeon: Ethyl Lenis, MD;  Location: [facility];  Service: General;;   TONSILLECTOMY  01/26/1977   TOTAL KNEE REVISION Left 2010   TOTAL KNEE REVISION Right 2022   TOTAL THYROIDECTOMY  01/27/2003   for  large nodule 2006   Patient Active Problem List   Diagnosis Date Noted   Insulin -requiring or dependent type II diabetes mellitus (HCC) 12/29/2023   Radiculitis of right cervical region 12/21/2023   DDD (degenerative disc disease), cervical 12/21/2023   Metabolic dysfunction-associated fatty liver disease (MAFLD) 12/21/2023   Diabetes mellitus due to underlying condition with diabetic neuropathy, without long-term current use of insulin  (HCC) 10/08/2023   Need for prophylactic vaccination with combined diphtheria-tetanus-pertussis (DTP) vaccine 09/22/2023   Need for prophylactic vaccination and inoculation against varicella 09/22/2023   Dyslipidemia, goal LDL below 70 01/06/2022   Stage 3a chronic kidney disease (HCC) 01/06/2022   Obstructive sleep apnea 09/08/2021   Primary hypertension 06/30/2021   Osteopenia 04/25/2010   CATARACT, LEFT EYE 11/27/2009  Obesity (BMI 30-39.9) 01/12/2008   VITAMIN D  DEFICIENCY 11/25/2006   Hypothyroidism 10/04/2006   RLS (restless legs syndrome) 10/04/2006   GERD 09/30/2006    PCP: Debby Molt, MD   REFERRING PROVIDER: Duwaine Pouch, NP  REFERRING DIAG: [phone] (ICD-10-CM) - Chronic bilateral low back pain without sciatica M47.819 (ICD-10-CM) - Spondylosis without myelopathy or radiculopathy M47.816 (ICD-10-CM) - Facet arthropathy, lumbar M79.604,M79.605 (ICD-10-CM) -  Bilateral leg pain R20.2 (ICD-10-CM) - Paresthesia of skin  Rationale for Evaluation and Treatment: Rehabilitation  THERAPY DIAG:  Other low back pain  Pain in right leg  Pain in left leg  Muscle weakness (generalized)  Cervicalgia  Other abnormalities of gait and mobility  ONSET DATE: few months ago   SUBJECTIVE:                                                                                                                                                                                           SUBJECTIVE STATEMENT: My legs bother me constantly.   PERTINENT HISTORY:  Patient reports that she has chronic back, neck, and bilat leg pain that has worsened over the last few months. Patient reports bilat TKA (Rt in 2022, Lt in 2010) as well as a fractured Lt LE where she was in a wheelchair for 26 weeks. Other than those, patient reports no other surgeries, trauma, or injuries to any of the areas or surrounding joints. Patient also reports no injections.   See PMH or personal factors for in depth comorbidities   PAIN:  Are you having pain? Yes: NPRS scale: 4-5/10 stiffness/pain at rest ; 10/10 at worst  Pain location: low back (mostly), bilat legs, neck Pain description: aching, sharp, shooting, dull, throbbing  Aggravating factors: pushing, pulling, bending, prolonged standing/walking  Relieving factors: hot tub with jets, tylenol , ropinerol, gabapentin    PRECAUTIONS: Fall  RED FLAGS: Bowel or bladder incontinence: No and Cauda equina syndrome: No   WEIGHT BEARING RESTRICTIONS: No  FALLS:  Has patient fallen in last 6 months? No  LIVING ENVIRONMENT: Lives with: lives with their spouse Lives in: House/apartment Stairs: Yes: External: 1 steps; none Has following equipment at home: Single point cane, Walker - 2 wheeled, bed side commode, and ADA accessible shower   OCCUPATION: caregiver for a lady with MS   PLOF: Independent  PATIENT GOALS: decrease pain in back,  sit and relax without pain  NEXT MD VISIT: 02/23/2024  OBJECTIVE:  Note: Objective measures were completed at Evaluation unless otherwise noted.  DIAGNOSTIC FINDINGS:  IMPRESSION: 1. Mild multilevel cervical spondylosis without significant spinal stenosis or overt neural impingement. 2. Mild left C3, right C4, and right C5  foraminal stenosis related to disc bulge and uncovertebral disease. 3. Mild to moderate multilevel facet hypertrophy as above, most pronounced within the upper cervical spine at C2-3 and C3-4. Findings could contribute to neck pain.  PATIENT SURVEYS:  PSFS: THE PATIENT SPECIFIC FUNCTIONAL SCALE  Place score of 0-10 (0 = unable to perform activity and 10 = able to perform activity at the same level as before injury or problem)  Activity Date: 01/26/2024    Lifting  5    2. pulling 5    3. walking  6    4.  Sitting  4    Total Score 5      Total Score = Sum of activity scores/number of activities  Minimally Detectable Change: 3 points (for single activity); 2 points (for average score)  [facility] (nd). The Patient Specific Functional Scale . Retrieved from [email or web address]   COGNITION: Overall cognitive status: Within functional limits for tasks assessed     SENSATION: Light touch: WFL WFL in UE and LE bilat ; subjective n/t down both arms, though Lt>Rt  POSTURE: rounded shoulders, forward head, decreased lumbar lordosis, and increased thoracic kyphosis  LUMBAR ROM: stiffness/pulling with all ROM, though subjective improvement in symptoms with all   AROM Eval 01/26/2024  Flexion WFL  Extension WFL  Right lateral flexion WFL  Left lateral flexion WFL  Right rotation WFL  Left rotation WFL    (Blank rows = not tested)  LOWER EXTREMITY ROM:     ROM Right Eval 01/26/2024 Left Eval 01/26/2024  Hip flexion    Hip extension    Hip abduction    Hip adduction    Hip  internal rotation    Hip external rotation    Knee flexion    Knee extension    Ankle dorsiflexion    Ankle plantarflexion    Ankle inversion    Ankle eversion     (Blank rows = not tested)  LOWER EXTREMITY MMT:    MMT Right Eval 01/26/2024 Left Eval 01/26/2024  Hip flexion (seated) 4/5 4-/5, painful  Hip extension    Hip abduction    Hip adduction    Hip internal rotation    Hip external rotation    Knee flexion (seated) 4/5 4/5  Knee extension (seated) 4/5 4/5  Ankle dorsiflexion (seated) 4/5 4/5  Ankle plantarflexion    Ankle inversion    Ankle eversion     (Blank rows = not tested)  Cervical ROM: 02/04/2024 Flexion: 42deg, pulling sensation noted  Extension: 32deg , discomfort  Rt lateral flexion: 35deg Lt lateral flexion: 30deg Rt rotation: 25% limited Lt rotation: 25% limited   GAIT: Distance walked: not objectively measured  Assistive device utilized: None Level of assistance: supervision  Comments: no overt gait deviations noted, though would benefit from more in depth analysis   TREATMENT DATE:  02/04/2023 TherEx:   HEP handout provided with PT discussing each activity, best ways to perform each activity, and showing videos to patient. Patient did perform one set of most activities with verbal and tactile cues provided.  Self-Care:  POC  PATIENT EDUCATION:  Education details: HEP, POC Person educated: Patient Education method: Explanation, Demonstration, Tactile cues, Verbal cues, and Handouts Education comprehension: verbalized understanding, returned demonstration, verbal cues required, and tactile cues required  HOME EXERCISE PROGRAM: Access Code: [email or web address] URL: [email or web address] Date: 02/04/2024 Prepared by: Susannah Daring  Exercises - Supine Lower Trunk Rotation  - 1 x daily - 7 x weekly - 2 sets - 10  reps - Seated Scapular Retraction  - 1 x daily - 7 x weekly - 2 sets - 10 reps - 2-3sec hold - Cervical Extension AROM with Strap  - 1 x daily - 7 x weekly - 2 sets - 10 reps - 5sec hold - Seated Cervical Retraction  - 1 x daily - 7 x weekly - 2 sets - 10 reps - 2-3sec hold - Sidelying Thoracic Rotation with Open Book  - 1 x daily - 7 x weekly - 2 sets - 10 reps - 5sec hold  ASSESSMENT:  CLINICAL IMPRESSION: Patient is a 74 y.o. F who was seen today for physical therapy evaluation and treatment for low back, cervical, and bilat leg pain presenting with deficits in motor coordination, strength, functional mobility, posture, and has high pain levels that affect ADLs. Patient is mainly limited due to bilat leg pain, however, also has restless leg syndrome which does not assist with pain. Patient will benefit from skilled PT to address above noted deficits.   OBJECTIVE IMPAIRMENTS: decreased activity tolerance, decreased balance, decreased coordination, difficulty walking, decreased ROM, decreased strength, impaired sensation, improper body mechanics, postural dysfunction, obesity, and pain.   ACTIVITY LIMITATIONS: lifting, bending, sitting, squatting, sleeping, stairs, and caring for others  PARTICIPATION LIMITATIONS: driving, community activity, and occupation  PERSONAL FACTORS: Past/current experiences, Profession, Time since onset of injury/illness/exacerbation, and 3+ comorbidities: restless leg syndrome, GERD, asthma, pre-diabetes, IBS, heart murmur, dyspnea, diverticulitis are also affecting patient's functional outcome.   REHAB POTENTIAL: Good  CLINICAL DECISION MAKING: Evolving/moderate complexity  EVALUATION COMPLEXITY: Moderate   GOALS: Goals reviewed with patient? Yes  SHORT TERM GOALS: Target date: 02/25/2024  Patient will show compliance with initial HEP. Baseline: Goal status: INITIAL  2.  Patient will report pain levels no greater than 7/10 in order to show an improved  overall quality of life. Baseline:  Goal status: INITIAL    LONG TERM GOALS: Target date: 03/31/2024  Patient will be independent with final HEP in order to maintain and progress upon functional gains made within PT. Baseline:  Goal status: INITIAL  2.  Patient will report pain levels no greater than 5/10 in order to show an improved overall quality of life. Baseline:  Goal status: INITIAL  3.  Patient will increase PSFS to at least 7 in order to show a significant improvement in subjective disability rating. Baseline:  Goal status: INITIAL  4.  Patient will increase cervical extension and flexion by at least 10deg each in order to increase functional mobility. Baseline:  Goal status: INITIAL  5.  Patient will ability to push/pull with working duties without pain in order to show an improved quality of life with work. Baseline:  Goal status: INITIAL   PLAN:  PT FREQUENCY: 1-2x/week  PT DURATION: 8 weeks  PLANNED INTERVENTIONS: 97164- PT Re-evaluation, 97750- Physical Performance Testing, 97110-Therapeutic exercises, 97530- Therapeutic activity, [id number]- Neuromuscular re-education, 97535- Self Care, 02859- Manual therapy, [id number]- Gait training, [id number]- Orthotic Initial, [id number]- Orthotic/Prosthetic subsequent, [phone]- Canalith repositioning, [id number]- Aquatic Therapy, H9716- Electrical stimulation (unattended), [id number]- Electrical stimulation (manual), [id number]- Vasopneumatic device, [id number]- Ultrasound,  02987- Traction (mechanical), 02966- Ionotophoresis 4mg /ml Dexamethasone , 79439 (1-2 muscles), 20561 (3+ muscles)- Dry Needling, Patient/Family education, Balance training, Stair training, Taping, Joint mobilization, Joint manipulation, Spinal manipulation, Spinal mobilization, Vestibular training, DME instructions, Cryotherapy, and Moist heat.  PLAN FOR NEXT SESSION: review HEP, cervical mobility, core and postural strengthening, hip musculature strength assessment, generalized strength and  mobility    Susannah Daring, PT, DPT 02/04/2024 1:19 PM    "

## 2024-02-04 ENCOUNTER — Ambulatory Visit

## 2024-02-04 DIAGNOSIS — M542 Cervicalgia: Secondary | ICD-10-CM | POA: Diagnosis not present

## 2024-02-04 DIAGNOSIS — M79605 Pain in left leg: Secondary | ICD-10-CM | POA: Diagnosis not present

## 2024-02-04 DIAGNOSIS — M79604 Pain in right leg: Secondary | ICD-10-CM

## 2024-02-04 DIAGNOSIS — M6281 Muscle weakness (generalized): Secondary | ICD-10-CM | POA: Diagnosis not present

## 2024-02-04 DIAGNOSIS — R2689 Other abnormalities of gait and mobility: Secondary | ICD-10-CM | POA: Diagnosis not present

## 2024-02-04 DIAGNOSIS — M5459 Other low back pain: Secondary | ICD-10-CM

## 2024-02-11 ENCOUNTER — Encounter: Admitting: Physical Therapy

## 2024-02-14 ENCOUNTER — Telehealth: Payer: Self-pay

## 2024-02-14 NOTE — Therapy (Signed)
 " OUTPATIENT PHYSICAL THERAPY THORACOLUMBAR TREATMENT    Patient Name: [name] MRN: [id number] DOB:05-29-1950, 74 y.o., female Today's Date: 02/15/2024  END OF SESSION:  PT End of Session - 02/15/24 0704     Visit Number 2    Number of Visits 16    Date for Recertification  03/31/24    Authorization Type MEDICARE    Progress Note Due on Visit 10    PT Start Time 0715    PT Stop Time 0758    PT Time Calculation (min) 43 min    Activity Tolerance Patient tolerated treatment well    Behavior During Therapy [facility] for tasks assessed/performed           Past Medical History:  Diagnosis Date   Acute pyelonephritis 05/02/2012   Asthma    Diverticulitis 03/24/2017   see report [facility] [place] [facility]   Dyspnea    nl PFTs, and echo   GERD (gastroesophageal reflux disease)    Headache(784.0)    Heart murmur    History of echocardiogram    Echocardiogram 7/23: EF 65-70, no RWMA, Gr 1 DD, normal RVSF, inadequate TR signal to est PASP, trivial MR, AV sclerosis w/o AS   History of kidney stones    IBS (irritable bowel syndrome)    Nephrolithiasis    Pre-diabetes 12/2013   Recurrent oral ulcers    Thyroid  goiter    I 131 rx and then  total throidectomy 2005 [facility]   Past Surgical History:  Procedure Laterality Date   ABDOMINAL HYSTERECTOMY  01/27/1992   fibroid   APPENDECTOMY  01/26/1966   CYSTOSCOPY WITH STENT PLACEMENT Bilateral 03/24/2017   Procedure: BILATERAL URETERAL CATHETER  PLACEMENT;  Surgeon: Alvaro Hummer, MD;  Location: [facility];  Service: Urology;  Laterality: Bilateral;   CYSTOSCOPY/RETROGRADE/URETEROSCOPY  03/24/2017   Procedure: CYSTOSCOPY/RETROGRADE/URETEROSCOPY;  Surgeon: Alvaro Hummer, MD;  Location: [facility];  Service: Urology;;   LAPAROSCOPIC PARTIAL COLECTOMY Left 03/24/2017   Procedure: LAPAROSCOPIC ASSISTED LEFT  SIGMOID COLECTOMY;  Surgeon: Ethyl Lenis, MD;  Location: [facility];  Service: General;  Laterality: Left;   orif left tivial plateau  fracture     Dr. Carlos 2/08   plates and pins take out of left knee  06/26/2008   RIGHT/LEFT HEART CATH AND CORONARY ANGIOGRAPHY N/A 07/24/2021   Procedure: RIGHT/LEFT HEART CATH AND CORONARY ANGIOGRAPHY;  Surgeon: Burnard Debby LABOR, MD;  Location: [facility];  Service: Cardiovascular;  Laterality: N/A;   SIGMOIDOSCOPY  03/24/2017   Procedure: SIGMOIDOSCOPY;  Surgeon: Ethyl Lenis, MD;  Location: [facility];  Service: General;;   TONSILLECTOMY  01/26/1977   TOTAL KNEE REVISION Left 2010   TOTAL KNEE REVISION Right 2022   TOTAL THYROIDECTOMY  01/27/2003   for  large nodule 2006   Patient Active Problem List   Diagnosis Date Noted   Insulin -requiring or dependent type II diabetes mellitus (HCC) 12/29/2023   Radiculitis of right cervical region 12/21/2023   DDD (degenerative disc disease), cervical 12/21/2023   Metabolic dysfunction-associated fatty liver disease (MAFLD) 12/21/2023   Diabetes mellitus due to underlying condition with diabetic neuropathy, without long-term current use of insulin  (HCC) 10/08/2023   Need for prophylactic vaccination with combined diphtheria-tetanus-pertussis (DTP) vaccine 09/22/2023   Need for prophylactic vaccination and inoculation against varicella 09/22/2023   Dyslipidemia, goal LDL below 70 01/06/2022   Stage 3a chronic kidney disease (HCC) 01/06/2022   Obstructive sleep apnea 09/08/2021   Primary hypertension 06/30/2021   Osteopenia 04/25/2010   CATARACT, LEFT EYE  11/27/2009   Obesity (BMI 30-39.9) 01/12/2008   VITAMIN D  DEFICIENCY 11/25/2006   Hypothyroidism 10/04/2006   RLS (restless legs syndrome) 10/04/2006   GERD 09/30/2006    PCP: Debby Molt, MD   REFERRING PROVIDER: Duwaine Pouch, NP  REFERRING DIAG: [id number] (ICD-10-CM) - Chronic bilateral low back pain without sciatica M47.819 (ICD-10-CM) - Spondylosis without myelopathy or radiculopathy M47.816 (ICD-10-CM) - Facet arthropathy, lumbar M79.604,M79.605 (ICD-10-CM) -  Bilateral leg pain R20.2 (ICD-10-CM) - Paresthesia of skin  Rationale for Evaluation and Treatment: Rehabilitation  THERAPY DIAG:  Other low back pain  Pain in right leg  Pain in left leg  Muscle weakness (generalized)  Cervicalgia  Other abnormalities of gait and mobility  ONSET DATE: few months ago   SUBJECTIVE:                                                                                                                                                                                           SUBJECTIVE STATEMENT: I think it's the weather that's bothering me.   PERTINENT HISTORY:  Patient reports that she has chronic back, neck, and bilat leg pain that has worsened over the last few months. Patient reports bilat TKA (Rt in 2022, Lt in 2010) as well as a fractured Lt LE where she was in a wheelchair for 26 weeks. Other than those, patient reports no other surgeries, trauma, or injuries to any of the areas or surrounding joints. Patient also reports no injections.   See PMH or personal factors for in depth comorbidities   PAIN:  Are you having pain? Yes: NPRS scale: 5-6/10 stiffness this morning  Pain location: low back (mostly), bilat legs, neck Pain description: aching, sharp, shooting, dull, throbbing  Aggravating factors: pushing, pulling, bending, prolonged standing/walking  Relieving factors: hot tub with jets, tylenol , ropinerol, gabapentin    PRECAUTIONS: Fall  RED FLAGS: Bowel or bladder incontinence: No and Cauda equina syndrome: No   WEIGHT BEARING RESTRICTIONS: No  FALLS:  Has patient fallen in last 6 months? No  LIVING ENVIRONMENT: Lives with: lives with their spouse Lives in: House/apartment Stairs: Yes: External: 1 steps; none Has following equipment at home: Single point cane, Walker - 2 wheeled, bed side commode, and ADA accessible shower   OCCUPATION: caregiver for a lady with MS   PLOF: Independent  PATIENT GOALS: decrease pain in back,  sit and relax without pain  NEXT MD VISIT: 02/23/2024  OBJECTIVE:  Note: Objective measures were completed at Evaluation unless otherwise noted.  DIAGNOSTIC FINDINGS:  IMPRESSION: 1. Mild multilevel cervical spondylosis without significant spinal stenosis or overt neural impingement. 2. Mild left C3, right C4, and  right C5 foraminal stenosis related to disc bulge and uncovertebral disease. 3. Mild to moderate multilevel facet hypertrophy as above, most pronounced within the upper cervical spine at C2-3 and C3-4. Findings could contribute to neck pain.  PATIENT SURVEYS:  PSFS: THE PATIENT SPECIFIC FUNCTIONAL SCALE  Place score of 0-10 (0 = unable to perform activity and 10 = able to perform activity at the same level as before injury or problem)  Activity Date: 02/04/2023    Lifting  5    2. pulling 5    3. walking  6    4.  Sitting  4    Total Score 5      Total Score = Sum of activity scores/number of activities  Minimally Detectable Change: 3 points (for single activity); 2 points (for average score)  [facility] (nd). The Patient Specific Functional Scale . Retrieved from [email or web address]   COGNITION: Overall cognitive status: Within functional limits for tasks assessed     SENSATION: Light touch: WFL WFL in UE and LE bilat ; subjective n/t down both arms, though Lt>Rt  POSTURE: rounded shoulders, forward head, decreased lumbar lordosis, and increased thoracic kyphosis  LUMBAR ROM: stiffness/pulling with all ROM, though subjective improvement in symptoms with all   AROM Eval 02/04/2024  Flexion WFL  Extension WFL  Right lateral flexion WFL  Left lateral flexion WFL  Right rotation WFL  Left rotation WFL    (Blank rows = not tested)  LOWER EXTREMITY ROM:     ROM Right Eval 02/04/2024 Left Eval 02/04/2024  Hip flexion    Hip extension    Hip abduction    Hip adduction    Hip internal  rotation    Hip external rotation    Knee flexion    Knee extension    Ankle dorsiflexion    Ankle plantarflexion    Ankle inversion    Ankle eversion     (Blank rows = not tested)  LOWER EXTREMITY MMT:    MMT Right Eval 02/04/2024 Left Eval 02/04/2024 02/15/2024  Hip flexion (seated) 4/5 4-/5, painful   Hip extension (prone)   Bilat: 4-/5  Hip abduction (sidelying)   Bilat: 4-/5  Hip adduction     Hip internal rotation     Hip external rotation     Knee flexion (seated) 4/5 4/5   Knee extension (seated) 4/5 4/5   Ankle dorsiflexion (seated) 4/5 4/5   Ankle plantarflexion     Ankle inversion     Ankle eversion      (Blank rows = not tested)  Cervical ROM: 02/04/2024 Flexion: 42deg, pulling sensation noted  Extension: 32deg , discomfort  Rt lateral flexion: 35deg Lt lateral flexion: 30deg Rt rotation: 25% limited Lt rotation: 25% limited   GAIT: Distance walked: not objectively measured  Assistive device utilized: None Level of assistance: supervision  Comments: no overt gait deviations noted, though would benefit from more in depth analysis   TREATMENT DATE:  02/15/2024 TherAct:  Nustep level 4 for 8 minutes with bilat LE and UE  Hooklying bridges with red TB around knees 2x10 with 2s holds Supine hamstring stretch 2x30s each side  Slant board gastroc stretch 3x30s  Standing rows with green TB 2x15 with 2s hold  Added to HEP  Standing straight arm pulldowns with red TB 2x15 with 2s holds  Added to HEP Paloff press with red TB 2x12 each direction  Added to HEP  02/04/2023 TherEx:   HEP handout provided with PT  discussing each activity, best ways to perform each activity, and showing videos to patient. Patient did perform one set of most activities with verbal and tactile cues provided.  Self-Care:  POC                                                                                                                               PATIENT EDUCATION:  Education  details: HEP, POC Person educated: Patient Education method: Explanation, Demonstration, Tactile cues, Verbal cues, and Handouts Education comprehension: verbalized understanding, returned demonstration, verbal cues required, and tactile cues required  HOME EXERCISE PROGRAM: Access Code: [email or web address] URL: [email or web address] Date: 02/15/2024 Prepared by: Susannah Daring  Exercises - Supine Lower Trunk Rotation  - 1 x daily - 7 x weekly - 2 sets - 10 reps - Seated Scapular Retraction  - 1 x daily - 7 x weekly - 2 sets - 10 reps - 2-3sec hold - Cervical Extension AROM with Strap  - 1 x daily - 7 x weekly - 2 sets - 10 reps - 5sec hold - Seated Cervical Retraction  - 1 x daily - 7 x weekly - 2 sets - 10 reps - 2-3sec hold - Sidelying Thoracic Rotation with Open Book  - 1 x daily - 7 x weekly - 2 sets - 10 reps - 5sec hold - Standing Shoulder Row with Anchored Resistance  - 1 x daily - 7 x weekly - 2 sets - 10 reps - 3sec hold - Shoulder Extension with Resistance  - 1 x daily - 7 x weekly - 2 sets - 10 reps - 3sec hold - Standing Anti-Rotation Press with Anchored Resistance  - 1 x daily - 7 x weekly - 2 sets - 12 reps  ASSESSMENT:  CLINICAL IMPRESSION: Patient arrived to session noting no changes in symptoms with increased soreness secondary to weather changes. Patient tolerated all activities this date and is interested in more activities that will assist her with being a caregiver. Patient will continue to benefit from skilled PT.   OBJECTIVE IMPAIRMENTS: decreased activity tolerance, decreased balance, decreased coordination, difficulty walking, decreased ROM, decreased strength, impaired sensation, improper body mechanics, postural dysfunction, obesity, and pain.   ACTIVITY LIMITATIONS: lifting, bending, sitting, squatting, sleeping, stairs, and caring for others  PARTICIPATION LIMITATIONS: driving, community activity, and occupation  PERSONAL FACTORS: Past/current  experiences, Profession, Time since onset of injury/illness/exacerbation, and 3+ comorbidities: restless leg syndrome, GERD, asthma, pre-diabetes, IBS, heart murmur, dyspnea, diverticulitis are also affecting patient's functional outcome.   REHAB POTENTIAL: Good  CLINICAL DECISION MAKING: Evolving/moderate complexity  EVALUATION COMPLEXITY: Moderate   GOALS: Goals reviewed with patient? Yes  SHORT TERM GOALS: Target date: 02/25/2024  Patient will show compliance with initial HEP. Baseline: Goal status: INITIAL  2.  Patient will report pain levels no greater than 7/10 in order to show an improved overall quality of life. Baseline:  Goal status: INITIAL    LONG TERM GOALS: Target date: 03/31/2024  Patient will be independent with final HEP in order to maintain and progress upon functional gains made within PT. Baseline:  Goal status: INITIAL  2.  Patient will report pain levels no greater than 5/10 in order to show an improved overall quality of life. Baseline:  Goal status: INITIAL  3.  Patient will increase PSFS to at least 7 in order to show a significant improvement in subjective disability rating. Baseline:  Goal status: INITIAL  4.  Patient will increase cervical extension and flexion by at least 10deg each in order to increase functional mobility. Baseline:  Goal status: INITIAL  5.  Patient will ability to push/pull with working duties without pain in order to show an improved quality of life with work. Baseline:  Goal status: INITIAL   PLAN:  PT FREQUENCY: 1-2x/week  PT DURATION: 8 weeks  PLANNED INTERVENTIONS: 97164- PT Re-evaluation, 97750- Physical Performance Testing, 97110-Therapeutic exercises, 97530- Therapeutic activity, [id number]- Neuromuscular re-education, 97535- Self Care, 02859- Manual therapy, [id number]- Gait training, [phone]- Orthotic Initial, [phone]- Orthotic/Prosthetic subsequent, [phone]- Canalith repositioning, [phone]- Aquatic Therapy, [phone]- Electrical  stimulation (unattended), [phone]- Electrical stimulation (manual), [id number]- Vasopneumatic device, [id number]- Ultrasound, [id number]- Traction (mechanical), [id number]- Ionotophoresis 4mg /ml Dexamethasone , 79439 (1-2 muscles), 20561 (3+ muscles)- Dry Needling, Patient/Family education, Balance training, Stair training, Taping, Joint mobilization, Joint manipulation, Spinal manipulation, Spinal mobilization, Vestibular training, DME instructions, Cryotherapy, and Moist heat.  PLAN FOR NEXT SESSION: add LE HEP exercises, cervical mobility, core and postural strengthening, generalized strength and mobility    Susannah Daring, PT, DPT 02/15/24 8:10 AM    "

## 2024-02-14 NOTE — Telephone Encounter (Signed)
 Copied from CRM [id number]. Topic: Clinical - Medication Refill >> Feb 14, 2024  8:07 AM Willma R wrote: Medication: levothyroxine  (SYNTHROID ) 125 MCG tablet   Has the patient contacted their pharmacy? Yes, call dr  This is the patient's preferred pharmacy:  [facility] - [place], [place] - [address] [place] [address] Phone: [phone] Fax: [phone]  Is this the correct pharmacy for this prescription? Yes  Has the prescription been filled recently? No  Is the patient out of the medication? No  Has the patient been seen for an appointment in the last year OR does the patient have an upcoming appointment? Yes  Can we respond through MyChart? Yes  Agent: Please be advised that Rx refills may take up to 3 business days. We ask that you follow-up with your pharmacy.

## 2024-02-15 ENCOUNTER — Ambulatory Visit

## 2024-02-15 DIAGNOSIS — M79604 Pain in right leg: Secondary | ICD-10-CM

## 2024-02-15 DIAGNOSIS — M542 Cervicalgia: Secondary | ICD-10-CM

## 2024-02-15 DIAGNOSIS — M79605 Pain in left leg: Secondary | ICD-10-CM

## 2024-02-15 DIAGNOSIS — M6281 Muscle weakness (generalized): Secondary | ICD-10-CM | POA: Diagnosis not present

## 2024-02-15 DIAGNOSIS — R2689 Other abnormalities of gait and mobility: Secondary | ICD-10-CM

## 2024-02-15 DIAGNOSIS — M5459 Other low back pain: Secondary | ICD-10-CM | POA: Diagnosis not present

## 2024-02-16 ENCOUNTER — Other Ambulatory Visit: Payer: Self-pay

## 2024-02-16 DIAGNOSIS — E039 Hypothyroidism, unspecified: Secondary | ICD-10-CM

## 2024-02-16 MED ORDER — LEVOTHYROXINE SODIUM 125 MCG PO TABS: 125.0000 ug | ORAL_TABLET | Freq: Every day | ORAL | 1 refills | Status: AC

## 2024-02-16 MED ORDER — LEVOTHYROXINE SODIUM 125 MCG PO TABS: 125.0000 ug | ORAL_TABLET | Freq: Every day | ORAL | 1 refills | Status: DC

## 2024-02-16 NOTE — Telephone Encounter (Signed)
 Medication has been refilled.

## 2024-02-17 ENCOUNTER — Telehealth: Payer: Self-pay

## 2024-02-17 ENCOUNTER — Encounter: Payer: Self-pay | Admitting: Physical Therapy

## 2024-02-17 ENCOUNTER — Other Ambulatory Visit: Payer: Self-pay | Admitting: Internal Medicine

## 2024-02-17 ENCOUNTER — Ambulatory Visit: Admitting: Physical Therapy

## 2024-02-17 DIAGNOSIS — M6281 Muscle weakness (generalized): Secondary | ICD-10-CM | POA: Diagnosis not present

## 2024-02-17 DIAGNOSIS — M79604 Pain in right leg: Secondary | ICD-10-CM

## 2024-02-17 DIAGNOSIS — M79605 Pain in left leg: Secondary | ICD-10-CM

## 2024-02-17 DIAGNOSIS — M5459 Other low back pain: Secondary | ICD-10-CM

## 2024-02-17 DIAGNOSIS — M542 Cervicalgia: Secondary | ICD-10-CM

## 2024-02-17 NOTE — Therapy (Signed)
 " OUTPATIENT PHYSICAL THERAPY THORACOLUMBAR TREATMENT    Patient Name: Helen  Lin MRN: [id number] DOB:Apr 13, 1950, 74 y.o., female Today's Date: 02/17/2024  END OF SESSION:  PT End of Session - 02/17/24 0804     Visit Number 3    Number of Visits 16    Date for Recertification  03/31/24    Authorization Type MEDICARE    Progress Note Due on Visit 10    PT Start Time 0801    PT Stop Time 0843    PT Time Calculation (min) 42 min    Activity Tolerance Patient tolerated treatment well    Behavior During Therapy [facility] for tasks assessed/performed            Past Medical History:  Diagnosis Date   Acute pyelonephritis 05/02/2012   Asthma    Diverticulitis 03/24/2017   see report [facility]   Dyspnea    nl PFTs, and echo   GERD (gastroesophageal reflux disease)    Headache(784.0)    Heart murmur    History of echocardiogram    Echocardiogram 7/23: EF 65-70, no RWMA, Gr 1 DD, normal RVSF, inadequate TR signal to est PASP, trivial MR, AV sclerosis w/o AS   History of kidney stones    IBS (irritable bowel syndrome)    Nephrolithiasis    Pre-diabetes 12/2013   Recurrent oral ulcers    Thyroid  goiter    I 131 rx and then  total throidectomy 2005 [facility]   Past Surgical History:  Procedure Laterality Date   ABDOMINAL HYSTERECTOMY  01/27/1992   fibroid   APPENDECTOMY  01/26/1966   CYSTOSCOPY WITH STENT PLACEMENT Bilateral 03/24/2017   Procedure: BILATERAL URETERAL CATHETER  PLACEMENT;  Surgeon: Alvaro Hummer, MD;  Location: [facility];  Service: Urology;  Laterality: Bilateral;   CYSTOSCOPY/RETROGRADE/URETEROSCOPY  03/24/2017   Procedure: CYSTOSCOPY/RETROGRADE/URETEROSCOPY;  Surgeon: Alvaro Hummer, MD;  Location: [facility];  Service: Urology;;   LAPAROSCOPIC PARTIAL COLECTOMY Left 03/24/2017   Procedure: LAPAROSCOPIC ASSISTED LEFT  SIGMOID COLECTOMY;  Surgeon: Ethyl Lenis, MD;  Location: [facility];  Service: General;  Laterality: Left;   orif left tivial  plateau fracture     Dr. Carlos 2/08   plates and pins take out of left knee  06/26/2008   RIGHT/LEFT HEART CATH AND CORONARY ANGIOGRAPHY N/A 07/24/2021   Procedure: RIGHT/LEFT HEART CATH AND CORONARY ANGIOGRAPHY;  Surgeon: Burnard Debby LABOR, MD;  Location: [facility];  Service: Cardiovascular;  Laterality: N/A;   SIGMOIDOSCOPY  03/24/2017   Procedure: SIGMOIDOSCOPY;  Surgeon: Ethyl Lenis, MD;  Location: [facility];  Service: General;;   TONSILLECTOMY  01/26/1977   TOTAL KNEE REVISION Left 2010   TOTAL KNEE REVISION Right 2022   TOTAL THYROIDECTOMY  01/27/2003   for  large nodule 2006   Patient Active Problem List   Diagnosis Date Noted   Insulin -requiring or dependent type II diabetes mellitus (HCC) 12/29/2023   Radiculitis of right cervical region 12/21/2023   DDD (degenerative disc disease), cervical 12/21/2023   Metabolic dysfunction-associated fatty liver disease (MAFLD) 12/21/2023   Diabetes mellitus due to underlying condition with diabetic neuropathy, without long-term current use of insulin  (HCC) 10/08/2023   Need for prophylactic vaccination with combined diphtheria-tetanus-pertussis (DTP) vaccine 09/22/2023   Need for prophylactic vaccination and inoculation against varicella 09/22/2023   Dyslipidemia, goal LDL below 70 01/06/2022   Stage 3a chronic kidney disease (HCC) 01/06/2022   Obstructive sleep apnea 09/08/2021   Primary hypertension 06/30/2021   Osteopenia 04/25/2010   CATARACT, LEFT  EYE 11/27/2009   Obesity (BMI 30-39.9) 01/12/2008   VITAMIN D  DEFICIENCY 11/25/2006   Hypothyroidism 10/04/2006   RLS (restless legs syndrome) 10/04/2006   GERD 09/30/2006    PCP: Debby Molt, MD   REFERRING PROVIDER: Duwaine Pouch, NP  REFERRING DIAG: [phone] (ICD-10-CM) - Chronic bilateral low back pain without sciatica M47.819 (ICD-10-CM) - Spondylosis without myelopathy or radiculopathy M47.816 (ICD-10-CM) - Facet arthropathy, lumbar M79.604,M79.605  (ICD-10-CM) - Bilateral leg pain R20.2 (ICD-10-CM) - Paresthesia of skin  Rationale for Evaluation and Treatment: Rehabilitation  THERAPY DIAG:  Other low back pain  Pain in right leg  Pain in left leg  Muscle weakness (generalized)  Cervicalgia  ONSET DATE: few months ago   SUBJECTIVE:                                                                                                                                                                                           SUBJECTIVE STATEMENT: I don't talk about my back.  Reports the cold has made it more stiff  PERTINENT HISTORY:  Patient reports that she has chronic back, neck, and bilat leg pain that has worsened over the last few months. Patient reports bilat TKA (Rt in 2022, Lt in 2010) as well as a fractured Lt LE where she was in a wheelchair for 26 weeks. Other than those, patient reports no other surgeries, trauma, or injuries to any of the areas or surrounding joints. Patient also reports no injections.   See PMH or personal factors for in depth comorbidities   PAIN:  Are you having pain? Yes: NPRS scale: 5/10 stiffness this morning  Pain location: low back (mostly), bilat legs, neck Pain description: aching, sharp, shooting, dull, throbbing  Aggravating factors: pushing, pulling, bending, prolonged standing/walking  Relieving factors: hot tub with jets, tylenol , ropinerol, gabapentin    PRECAUTIONS: Fall  RED FLAGS: Bowel or bladder incontinence: No and Cauda equina syndrome: No   WEIGHT BEARING RESTRICTIONS: No  FALLS:  Has patient fallen in last 6 months? No  LIVING ENVIRONMENT: Lives with: lives with their spouse Lives in: House/apartment Stairs: Yes: External: 1 steps; none Has following equipment at home: Single point cane, Walker - 2 wheeled, bed side commode, and ADA accessible shower   OCCUPATION: caregiver for a lady with MS   PLOF: Independent  PATIENT GOALS: decrease pain in back, sit and  relax without pain  NEXT MD VISIT: 02/23/2024  OBJECTIVE:  Note: Objective measures were completed at Evaluation unless otherwise noted.  DIAGNOSTIC FINDINGS:  IMPRESSION: 1. Mild multilevel cervical spondylosis without significant spinal stenosis or overt neural impingement. 2. Mild left C3, right C4, and  right C5 foraminal stenosis related to disc bulge and uncovertebral disease. 3. Mild to moderate multilevel facet hypertrophy as above, most pronounced within the upper cervical spine at C2-3 and C3-4. Findings could contribute to neck pain.  PATIENT SURVEYS:  PSFS: THE PATIENT SPECIFIC FUNCTIONAL SCALE  Place score of 0-10 (0 = unable to perform activity and 10 = able to perform activity at the same level as before injury or problem)  Activity Date: 02/04/2023    Lifting  5    2. pulling 5    3. walking  6    4.  Sitting  4    Total Score 5      Total Score = Sum of activity scores/number of activities  Minimally Detectable Change: 3 points (for single activity); 2 points (for average score)  [facility] (nd). The Patient Specific Functional Scale . Retrieved from [email or web address]   COGNITION: Overall cognitive status: Within functional limits for tasks assessed     SENSATION: Light touch: WFL WFL in UE and LE bilat ; subjective n/t down both arms, though Lt>Rt  POSTURE: rounded shoulders, forward head, decreased lumbar lordosis, and increased thoracic kyphosis  LUMBAR ROM: stiffness/pulling with all ROM, though subjective improvement in symptoms with all   AROM Eval 02/04/2024  Flexion WFL  Extension WFL  Right lateral flexion WFL  Left lateral flexion WFL  Right rotation WFL  Left rotation WFL    (Blank rows = not tested)  LOWER EXTREMITY ROM:     ROM Right Eval 02/04/2024 Left Eval 02/04/2024  Hip flexion    Hip extension    Hip abduction    Hip adduction    Hip internal rotation     Hip external rotation    Knee flexion    Knee extension    Ankle dorsiflexion    Ankle plantarflexion    Ankle inversion    Ankle eversion     (Blank rows = not tested)  LOWER EXTREMITY MMT:    MMT Right Eval 02/04/2024 Left Eval 02/04/2024 02/15/2024  Hip flexion (seated) 4/5 4-/5, painful   Hip extension (prone)   Bilat: 4-/5  Hip abduction (sidelying)   Bilat: 4-/5  Hip adduction     Hip internal rotation     Hip external rotation     Knee flexion (seated) 4/5 4/5   Knee extension (seated) 4/5 4/5   Ankle dorsiflexion (seated) 4/5 4/5   Ankle plantarflexion     Ankle inversion     Ankle eversion      (Blank rows = not tested)  Cervical ROM: 02/04/2024 Flexion: 42deg, pulling sensation noted  Extension: 32deg , discomfort  Rt lateral flexion: 35deg Lt lateral flexion: 30deg Rt rotation: 25% limited Lt rotation: 25% limited   GAIT: Distance walked: not objectively measured  Assistive device utilized: None Level of assistance: supervision  Comments: no overt gait deviations noted, though would benefit from more in depth analysis   TREATMENT DATE:  02/17/24 TherAct NuStep L5 x 8 min for strengthening and endurance Sit to/from stand 2x10 without UE support Standing hip extension 2x10; L3 band Standing hip abduction 2x10; L3 band Standing calf raises 2x10  TherEx Single knee to chest 3x30 sec bil Lower trunk rotation 3x30 sec bil Double knee to chest with A/P rocking 2x15 sec  Manual IASTM with percussive device to bil upper traps/levator scapulae/rhomboids  02/15/2024 TherAct:  Nustep level 4 for 8 minutes with bilat LE and UE  Hooklying bridges with red TB  around knees 2x10 with 2s holds Supine hamstring stretch 2x30s each side  Slant board gastroc stretch 3x30s  Standing rows with green TB 2x15 with 2s hold  Added to HEP  Standing straight arm pulldowns with red TB 2x15 with 2s holds  Added to HEP Paloff press with red TB 2x12 each direction  Added to  HEP  02/04/2023 TherEx:   HEP handout provided with PT discussing each activity, best ways to perform each activity, and showing videos to patient. Patient did perform one set of most activities with verbal and tactile cues provided.  Self-Care:  POC                                                                                                                               PATIENT EDUCATION:  Education details: HEP, POC Person educated: Patient Education method: Explanation, Demonstration, Tactile cues, Verbal cues, and Handouts Education comprehension: verbalized understanding, returned demonstration, verbal cues required, and tactile cues required  HOME EXERCISE PROGRAM: Access Code: [email or web address] URL: [email or web address] Date: 02/17/2024 Prepared by: Corean Ku  Exercises - Supine Lower Trunk Rotation  - 1 x daily - 7 x weekly - 2 sets - 10 reps - Seated Scapular Retraction  - 1 x daily - 7 x weekly - 2 sets - 10 reps - 2-3sec hold - Cervical Extension AROM with Strap  - 1 x daily - 7 x weekly - 2 sets - 10 reps - 5sec hold - Seated Cervical Retraction  - 1 x daily - 7 x weekly - 2 sets - 10 reps - 2-3sec hold - Sidelying Thoracic Rotation with Open Book  - 1 x daily - 7 x weekly - 2 sets - 10 reps - 5sec hold - Standing Shoulder Row with Anchored Resistance  - 1 x daily - 7 x weekly - 2 sets - 10 reps - 3sec hold - Shoulder Extension with Resistance  - 1 x daily - 7 x weekly - 2 sets - 10 reps - 3sec hold - Standing Anti-Rotation Press with Anchored Resistance  - 1 x daily - 7 x weekly - 2 sets - 12 reps - Sit to Stand  - 1 x daily - 7 x weekly - 2 sets - 10 reps - Standing Hip Abduction with Resistance at Ankles and Counter Support  - 1 x daily - 7 x weekly - 2 sets - 10 reps - Standing Hip Extension with Resistance at Ankles and Counter Support  - 1 x daily - 7 x weekly - 2 sets - 10 reps - Heel Raises with Counter Support  - 1 x daily - 7 x weekly - 1 sets  - 20 reps  ASSESSMENT:  CLINICAL IMPRESSION: Pt tolerated session well today; added LE strengthening to HEP today.  Will continue to benefit from PT to maximize function.    OBJECTIVE IMPAIRMENTS: decreased activity tolerance, decreased balance, decreased coordination, difficulty walking, decreased ROM, decreased strength, impaired  sensation, improper body mechanics, postural dysfunction, obesity, and pain.   ACTIVITY LIMITATIONS: lifting, bending, sitting, squatting, sleeping, stairs, and caring for others  PARTICIPATION LIMITATIONS: driving, community activity, and occupation  PERSONAL FACTORS: Past/current experiences, Profession, Time since onset of injury/illness/exacerbation, and 3+ comorbidities: restless leg syndrome, GERD, asthma, pre-diabetes, IBS, heart murmur, dyspnea, diverticulitis are also affecting patient's functional outcome.   REHAB POTENTIAL: Good  CLINICAL DECISION MAKING: Evolving/moderate complexity  EVALUATION COMPLEXITY: Moderate   GOALS: Goals reviewed with patient? Yes  SHORT TERM GOALS: Target date: 02/25/2024  Patient will show compliance with initial HEP. Baseline: Goal status: INITIAL  2.  Patient will report pain levels no greater than 7/10 in order to show an improved overall quality of life. Baseline:  Goal status: INITIAL    LONG TERM GOALS: Target date: 03/31/2024  Patient will be independent with final HEP in order to maintain and progress upon functional gains made within PT. Baseline:  Goal status: INITIAL  2.  Patient will report pain levels no greater than 5/10 in order to show an improved overall quality of life. Baseline:  Goal status: INITIAL  3.  Patient will increase PSFS to at least 7 in order to show a significant improvement in subjective disability rating. Baseline:  Goal status: INITIAL  4.  Patient will increase cervical extension and flexion by at least 10deg each in order to increase functional mobility. Baseline:   Goal status: INITIAL  5.  Patient will ability to push/pull with working duties without pain in order to show an improved quality of life with work. Baseline:  Goal status: INITIAL   PLAN:  PT FREQUENCY: 1-2x/week  PT DURATION: 8 weeks  PLANNED INTERVENTIONS: 97164- PT Re-evaluation, 97750- Physical Performance Testing, 97110-Therapeutic exercises, 97530- Therapeutic activity, [id number]- Neuromuscular re-education, 97535- Self Care, 02859- Manual therapy, [id number]- Gait training, [phone]- Orthotic Initial, [phone]- Orthotic/Prosthetic subsequent, [phone]- Canalith repositioning, [phone]- Aquatic Therapy, [phone]- Electrical stimulation (unattended), [phone]- Electrical stimulation (manual), [id number]- Vasopneumatic device, [id number]- Ultrasound, [id number]- Traction (mechanical), [id number]- Ionotophoresis 4mg /ml Dexamethasone , 79439 (1-2 muscles), 20561 (3+ muscles)- Dry Needling, Patient/Family education, Balance training, Stair training, Taping, Joint mobilization, Joint manipulation, Spinal manipulation, Spinal mobilization, Vestibular training, DME instructions, Cryotherapy, and Moist heat.  PLAN FOR NEXT SESSION: review HEP PRN, percussive device PRN, cervical mobility, core and postural strengthening, generalized strength and mobility    Corean JULIANNA Ku, PT, DPT 02/17/24 10:15 AM    "

## 2024-02-17 NOTE — Telephone Encounter (Signed)
 Copied from CRM #[id number]. Topic: Clinical - Medication Refill >> Feb 17, 2024  9:27 AM Treva T wrote: Medication: pantoprazole  (PROTONIX ) 40 MG tablet  Pt states prescription should indicate take 1 tablet twice a day  Has the patient contacted their pharmacy? Yes, pharmacy calling for pt   This is the patient's preferred pharmacy:  [id number] [place], [place] - [address] [place] [address] Phone: [phone] Fax: [phone]    Is this the correct pharmacy for this prescription? Yes If no, delete pharmacy and type the correct one.   Has the prescription been filled recently? Yes  Is the patient out of the medication? Yes  Has the patient been seen for an appointment in the last year OR does the patient have an upcoming appointment? Yes  Can we respond through MyChart? Yes  Agent: Please be advised that Rx refills may take up to 3 business days. We ask that you follow-up with your pharmacy.

## 2024-02-18 NOTE — Telephone Encounter (Signed)
 Medication has been refilled.

## 2024-02-23 ENCOUNTER — Encounter

## 2024-02-23 ENCOUNTER — Ambulatory Visit: Admitting: Physical Medicine and Rehabilitation

## 2024-02-25 ENCOUNTER — Encounter: Admitting: Physical Therapy

## 2024-02-28 ENCOUNTER — Encounter

## 2024-03-01 ENCOUNTER — Encounter: Admitting: Physical Therapy

## 2024-03-01 ENCOUNTER — Ambulatory Visit: Admitting: Physical Medicine and Rehabilitation

## 2024-03-02 ENCOUNTER — Ambulatory Visit: Admitting: Physical Medicine and Rehabilitation

## 2024-03-06 ENCOUNTER — Encounter

## 2024-03-08 ENCOUNTER — Encounter: Admitting: Physical Therapy

## 2024-03-16 ENCOUNTER — Ambulatory Visit (HOSPITAL_BASED_OUTPATIENT_CLINIC_OR_DEPARTMENT_OTHER): Admitting: Pulmonary Disease

## 2024-03-23 ENCOUNTER — Ambulatory Visit: Admitting: Internal Medicine

## 2024-12-28 ENCOUNTER — Ambulatory Visit

## 2024-12-28 ENCOUNTER — Encounter: Admitting: Internal Medicine
